# Patient Record
Sex: Female | Born: 1950
Health system: Southern US, Community
[De-identification: ages and names within clinical notes are randomized; demographics above are authoritative.]

## PROBLEM LIST (undated history)

## (undated) DIAGNOSIS — E119 Type 2 diabetes mellitus without complications: Secondary | ICD-10-CM

## (undated) DIAGNOSIS — K219 Gastro-esophageal reflux disease without esophagitis: Secondary | ICD-10-CM

## (undated) DIAGNOSIS — I82409 Acute embolism and thrombosis of unspecified deep veins of unspecified lower extremity: Secondary | ICD-10-CM

## (undated) DIAGNOSIS — F419 Anxiety disorder, unspecified: Secondary | ICD-10-CM

## (undated) DIAGNOSIS — M47816 Spondylosis without myelopathy or radiculopathy, lumbar region: Secondary | ICD-10-CM

## (undated) DIAGNOSIS — M51369 Other intervertebral disc degeneration, lumbar region without mention of lumbar back pain or lower extremity pain: Secondary | ICD-10-CM

## (undated) DIAGNOSIS — T8859XA Other complications of anesthesia, initial encounter: Secondary | ICD-10-CM

## (undated) DIAGNOSIS — M5136 Other intervertebral disc degeneration, lumbar region: Secondary | ICD-10-CM

## (undated) DIAGNOSIS — M797 Fibromyalgia: Secondary | ICD-10-CM

## (undated) DIAGNOSIS — Z8719 Personal history of other diseases of the digestive system: Secondary | ICD-10-CM

## (undated) DIAGNOSIS — I10 Essential (primary) hypertension: Secondary | ICD-10-CM

## (undated) DIAGNOSIS — K760 Fatty (change of) liver, not elsewhere classified: Secondary | ICD-10-CM

## (undated) DIAGNOSIS — Z9889 Other specified postprocedural states: Secondary | ICD-10-CM

## (undated) DIAGNOSIS — J189 Pneumonia, unspecified organism: Secondary | ICD-10-CM

## (undated) DIAGNOSIS — Z95818 Presence of other cardiac implants and grafts: Secondary | ICD-10-CM

## (undated) DIAGNOSIS — E039 Hypothyroidism, unspecified: Secondary | ICD-10-CM

## (undated) DIAGNOSIS — T4145XA Adverse effect of unspecified anesthetic, initial encounter: Secondary | ICD-10-CM

## (undated) DIAGNOSIS — H409 Unspecified glaucoma: Secondary | ICD-10-CM

## (undated) DIAGNOSIS — I2699 Other pulmonary embolism without acute cor pulmonale: Secondary | ICD-10-CM

## (undated) DIAGNOSIS — N200 Calculus of kidney: Secondary | ICD-10-CM

## (undated) DIAGNOSIS — G5600 Carpal tunnel syndrome, unspecified upper limb: Secondary | ICD-10-CM

## (undated) HISTORY — DX: Carpal tunnel syndrome, unspecified upper limb: G56.00

## (undated) HISTORY — DX: Gastro-esophageal reflux disease without esophagitis: K21.9

## (undated) HISTORY — DX: Spondylosis without myelopathy or radiculopathy, lumbar region: M47.816

## (undated) HISTORY — PX: BACK SURGERY: SHX140

## (undated) HISTORY — DX: Acute embolism and thrombosis of unspecified deep veins of unspecified lower extremity: I82.409

## (undated) HISTORY — DX: Fatty (change of) liver, not elsewhere classified: K76.0

## (undated) HISTORY — PX: ARM HARDWARE REMOVAL: SUR1122

## (undated) HISTORY — DX: Other pulmonary embolism without acute cor pulmonale: I26.99

## (undated) HISTORY — PX: TUBAL LIGATION: SHX77

## (undated) HISTORY — PX: ABDOMINAL HYSTERECTOMY: SHX81

## (undated) HISTORY — DX: Other intervertebral disc degeneration, lumbar region: M51.36

## (undated) HISTORY — PX: KNEE ARTHROSCOPY: SHX127

## (undated) HISTORY — DX: Other intervertebral disc degeneration, lumbar region without mention of lumbar back pain or lower extremity pain: M51.369

## (undated) HISTORY — DX: Other specified postprocedural states: Z98.890

## (undated) HISTORY — DX: Presence of other cardiac implants and grafts: Z95.818

## (undated) HISTORY — DX: Fibromyalgia: M79.7

## (undated) HISTORY — PX: CHOLECYSTECTOMY: SHX55

## (undated) HISTORY — DX: Calculus of kidney: N20.0

## (undated) HISTORY — PX: FRACTURE SURGERY: SHX138

## (undated) HISTORY — DX: Anxiety disorder, unspecified: F41.9

---
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
## 1989-04-06 HISTORY — PX: CARPAL TUNNEL RELEASE: SHX101

## 1992-04-06 HISTORY — PX: CARDIAC CATHETERIZATION: SHX172

## 1994-04-06 HISTORY — PX: DE QUERVAIN'S RELEASE: SHX1439

## 1996-04-06 HISTORY — PX: ORIF FINGER / THUMB FRACTURE: SUR932

## 1997-12-29 ENCOUNTER — Emergency Department (HOSPITAL_COMMUNITY): Admission: EM | Admit: 1997-12-29 | Discharge: 1997-12-29 | Payer: Self-pay | Admitting: Emergency Medicine

## 1999-12-31 ENCOUNTER — Encounter: Payer: Self-pay | Admitting: Internal Medicine

## 1999-12-31 ENCOUNTER — Ambulatory Visit (HOSPITAL_COMMUNITY): Admission: RE | Admit: 1999-12-31 | Discharge: 1999-12-31 | Payer: Self-pay | Admitting: Internal Medicine

## 2003-07-18 ENCOUNTER — Ambulatory Visit (HOSPITAL_COMMUNITY): Admission: RE | Admit: 2003-07-18 | Discharge: 2003-07-18 | Payer: Self-pay | Admitting: Urology

## 2004-04-30 ENCOUNTER — Ambulatory Visit: Payer: Self-pay | Admitting: Internal Medicine

## 2004-05-02 ENCOUNTER — Ambulatory Visit (HOSPITAL_COMMUNITY): Admission: RE | Admit: 2004-05-02 | Discharge: 2004-05-02 | Payer: Self-pay | Admitting: Internal Medicine

## 2004-06-04 ENCOUNTER — Emergency Department (HOSPITAL_COMMUNITY): Admission: EM | Admit: 2004-06-04 | Discharge: 2004-06-04 | Payer: Self-pay | Admitting: Emergency Medicine

## 2004-06-13 ENCOUNTER — Emergency Department (HOSPITAL_COMMUNITY): Admission: EM | Admit: 2004-06-13 | Discharge: 2004-06-13 | Payer: Self-pay | Admitting: Emergency Medicine

## 2004-06-17 ENCOUNTER — Ambulatory Visit: Payer: Self-pay | Admitting: Internal Medicine

## 2004-06-17 ENCOUNTER — Ambulatory Visit (HOSPITAL_COMMUNITY): Admission: RE | Admit: 2004-06-17 | Discharge: 2004-06-17 | Payer: Self-pay | Admitting: Internal Medicine

## 2004-06-17 HISTORY — PX: ESOPHAGOGASTRODUODENOSCOPY: SHX1529

## 2004-06-17 HISTORY — PX: COLONOSCOPY: SHX174

## 2004-06-27 ENCOUNTER — Ambulatory Visit: Payer: Self-pay | Admitting: Cardiology

## 2004-07-17 ENCOUNTER — Ambulatory Visit: Payer: Self-pay | Admitting: Internal Medicine

## 2004-07-29 ENCOUNTER — Ambulatory Visit: Payer: Self-pay | Admitting: Internal Medicine

## 2004-08-21 ENCOUNTER — Ambulatory Visit (HOSPITAL_COMMUNITY): Admission: RE | Admit: 2004-08-21 | Discharge: 2004-08-21 | Payer: Self-pay | Admitting: Internal Medicine

## 2004-08-21 ENCOUNTER — Ambulatory Visit: Payer: Self-pay | Admitting: Internal Medicine

## 2005-02-04 ENCOUNTER — Encounter
Admission: RE | Admit: 2005-02-04 | Discharge: 2005-05-05 | Payer: Self-pay | Admitting: Physical Medicine & Rehabilitation

## 2006-01-25 ENCOUNTER — Ambulatory Visit: Payer: Self-pay | Admitting: Orthopedic Surgery

## 2006-01-29 ENCOUNTER — Encounter: Payer: Self-pay | Admitting: Orthopedic Surgery

## 2006-01-29 ENCOUNTER — Emergency Department (HOSPITAL_COMMUNITY): Admission: EM | Admit: 2006-01-29 | Discharge: 2006-01-29 | Payer: Self-pay | Admitting: Emergency Medicine

## 2006-02-19 ENCOUNTER — Encounter: Admission: RE | Admit: 2006-02-19 | Discharge: 2006-02-19 | Payer: Self-pay | Admitting: Orthopedic Surgery

## 2006-02-19 ENCOUNTER — Encounter: Payer: Self-pay | Admitting: Orthopedic Surgery

## 2006-03-15 ENCOUNTER — Encounter: Admission: RE | Admit: 2006-03-15 | Discharge: 2006-03-15 | Payer: Self-pay | Admitting: Internal Medicine

## 2006-05-04 ENCOUNTER — Encounter: Admission: RE | Admit: 2006-05-04 | Discharge: 2006-05-04 | Payer: Self-pay | Admitting: Orthopedic Surgery

## 2007-07-27 ENCOUNTER — Ambulatory Visit: Payer: Self-pay | Admitting: Cardiology

## 2008-02-10 ENCOUNTER — Ambulatory Visit: Payer: Self-pay | Admitting: Internal Medicine

## 2008-08-17 ENCOUNTER — Encounter: Payer: Self-pay | Admitting: Gastroenterology

## 2008-10-17 ENCOUNTER — Encounter: Payer: Self-pay | Admitting: Gastroenterology

## 2009-02-06 ENCOUNTER — Encounter: Payer: Self-pay | Admitting: Urgent Care

## 2009-06-07 ENCOUNTER — Encounter: Payer: Self-pay | Admitting: Internal Medicine

## 2009-08-15 ENCOUNTER — Encounter: Payer: Self-pay | Admitting: Gastroenterology

## 2009-09-16 ENCOUNTER — Ambulatory Visit: Payer: Self-pay | Admitting: Orthopedic Surgery

## 2009-09-16 DIAGNOSIS — M48061 Spinal stenosis, lumbar region without neurogenic claudication: Secondary | ICD-10-CM

## 2009-09-19 ENCOUNTER — Encounter: Payer: Self-pay | Admitting: Orthopedic Surgery

## 2009-09-23 ENCOUNTER — Ambulatory Visit (HOSPITAL_COMMUNITY): Admission: RE | Admit: 2009-09-23 | Discharge: 2009-09-23 | Payer: Self-pay | Admitting: Orthopedic Surgery

## 2009-09-24 ENCOUNTER — Telehealth: Payer: Self-pay | Admitting: Orthopedic Surgery

## 2009-10-01 ENCOUNTER — Telehealth: Payer: Self-pay | Admitting: Orthopedic Surgery

## 2009-10-14 ENCOUNTER — Telehealth: Payer: Self-pay | Admitting: Orthopedic Surgery

## 2009-10-23 ENCOUNTER — Encounter: Payer: Self-pay | Admitting: Gastroenterology

## 2009-12-25 ENCOUNTER — Ambulatory Visit: Payer: Self-pay | Admitting: Internal Medicine

## 2010-01-08 DIAGNOSIS — K219 Gastro-esophageal reflux disease without esophagitis: Secondary | ICD-10-CM | POA: Insufficient documentation

## 2010-01-13 ENCOUNTER — Ambulatory Visit: Payer: Self-pay | Admitting: Internal Medicine

## 2010-01-13 ENCOUNTER — Ambulatory Visit (HOSPITAL_COMMUNITY): Admission: RE | Admit: 2010-01-13 | Discharge: 2010-01-13 | Payer: Self-pay | Admitting: Internal Medicine

## 2010-01-13 HISTORY — PX: COLONOSCOPY: SHX174

## 2010-01-19 ENCOUNTER — Encounter: Payer: Self-pay | Admitting: Internal Medicine

## 2010-05-08 NOTE — Progress Notes (Signed)
Summary: Referral to Neurosurgeon.  Phone Note Outgoing Call   Call placed by: Waldon Reining,  September 24, 2009 10:15 AM Call placed to: Specialist Action Taken: Information Sent Summary of Call: I faxed a referral for this patient to [facility] to be seen for Spinal Stenosis of the l-spine.

## 2010-05-08 NOTE — Assessment & Plan Note (Signed)
Summary: OV FOR REFILL OF MEDS/JBB   Visit Type:  Initial Visit Primary Care Provider:  [facility] [place]  Chief Complaint:  needs refills on meds and wants to discuss timing of next tcs.  History of Present Illness: 60 year old lady returns to see me for consideration of high risk screening colonoscopy. Family history of a younger brother succumbed to disease in his 27s. Patient's last colonoscopy was in 2006. She had  left-sided diverticular disease. No current bowel symptoms whatsoever. History of GERD with erosive reflux esophagitis on 2006 EGD. Symptoms are well-controlled with a Dexilant 60 mg orally daily; no dysphagia, no rectal bleeding, melena or nausea vomiting. She does state when she was hospitalized for 4 days over the summer in [place] with what was described as either ileus versus a partial small bowel obstruction. She recovered spontaneously.  She's planning to have spine surgery later this year. She was wants her colonoscopy out of the way prior to having the surgery.  Current Problems (verified): 1)  Spinal Stenosis, Lumbar  (ICD-724.02) 2)  Hx, Family, Asthma  (ICD-V17.5) 3)  Family History of Arthritis  (ICD-V17.7) 4)  Family History Coronary Heart Disease Female < 55  (ICD-V17.3)  Current Medications (verified): 1)  Ultram 50 Mg Tabs (Tramadol Hcl) .... One By Mouth Q 4 Hrs As Needed Pain 2)  Flexeril 10 Mg Tabs (Cyclobenzaprine Hcl) .... One By Mouth Q 8 Hrs As Needed Pain 3)  Dexilant 60 Mg Cpdr (Dexlansoprazole) .... One By Mouth 30 Mins Before Breakfast Daily 4)  Synthroid 50 Mcg Tabs (Levothyroxine Sodium) .... Once Daily 5)  Vitamin D-2   1.25mg  .... Q Week 6)  Metoprolol Succinate 50 Mg Xr24h-Tab (Metoprolol Succinate) .... Once Daily  Allergies (verified): 1)  ! * Ms Contin 2)  ! Codeine 3)  ! Vicodin 4)  ! Nubain 5)  ! Asa 6)  ! Morphine  Past History:  Past Medical History: Last updated:  09/16/2009 stress depression anxiety osteoporosis fatigue GERD Back pain, chronic  Past Surgical History: Last updated: 09/16/2009 tonsils adenoids 2 cardiac caths cardiac event recorder Tubal ligation hysterectomy lithotripsy x 2 left arm plates and then removal left knee scope left thumb pins left wrist Deq's rt wrist carpal tunnel x 2 rt hand  Family History: Last updated: 09/16/2009 FH of Cancer:  Family History Coronary Heart Disease female < 64 Family History of Arthritis Hx, family, asthma  Social History: Last updated: 09/16/2009 Patient is divorced.  disabled no smoking no alcohol 1 liter of caffeine per day  Vital Signs:  Patient profile:   60 year old female Height:      65 inches Weight:      248 pounds BMI:     41.42 Temp:     99.0 degrees F oral Pulse rate:   72 / minute BP sitting:   124 / 88  (left arm) Cuff size:   large  Vitals Entered By: Hendricks Limes LPN (December 25, 2009 2:32 PM)  Physical Exam  General:  pleasant alert conversant lady resting comfortably Lungs:  clear to auscultation Heart:  regular rate and rhythm without murmur gallop rub Abdomen:  obese positive bowel sounds soft nontender without appreciable mass or organomegaly  Impression & Recommendations: Impression: 60 year old lady with a positive family history of colon cancer in a first-degree relative at a young age and negative colonoscopy in 2006. She is due for high risk screening at this time. It makes sense to proceed with a screening colonoscopy prior  to her embarking on spine surgery later this year.  GERD symptoms well-controlled on Dexalant 60 mg orally daily. No alarm symptoms.  Recommendations: Continue Dexalant 60 mg orally daily prescription given antireflux measures/lifestyle emphasized  Screening colonoscopy in the near future. Risks, benefits, limitations, alternatives and imponderables have been reviewed her questions were answered. She is  agreeable. Further recommendations to follow once colonoscopy has been performed.  Appended Document: Orders Update    Clinical Lists Changes  Problems: Added new problem of GERD (ICD-530.81) Added new problem of NEOPLASM, MALIGNANT, COLORECTAL, FAMILY HX (ICD-V16.0) Orders: Added new Service order of New Patient Level IV ([id number]) - Signed

## 2010-05-08 NOTE — Progress Notes (Signed)
Summary: Initial evaluation  Initial evaluation   Imported By: Jacklynn Ganong 09/12/2009 14:55:22  _____________________________________________________________________  External Attachment:    Type:   Image     Comment:   External Document

## 2010-05-08 NOTE — Letter (Signed)
Summary: Patient Notice, Colon Biopsy Results  [facility]  [address]   [place], [place] [address]   Phone: [phone]  Fax: [phone]       January 19, 2010   CELESTA FUNDERBURK [address] [place], [place]  [address] 10-24-1950    Dear [name],  I am pleased to inform you that the biopsies taken during your recent colonoscopy did not show any evidence of cancer upon pathologic examination.  Additional information/recommendations:  You should have a repeat colonoscopy examination  in 5 ears.  Please call us if you are having persistent problems or have questions about your condition that have not been fully answered at this time.  Sincerely,    R. Roetta Sessions MD, FACP [facility] Ph: [phone]    Fax: [phone]   Appended Document: Patient Notice, Colon Biopsy Results Letter mailed to pt.  Appended Document: Patient Notice, Colon Biopsy Results Letter and path mailed to PCP. [facility].  Appended Document: Patient Notice, Colon Biopsy Results 74YR TCS REMINDER IS IN THE COMPUTER

## 2010-05-08 NOTE — Letter (Signed)
Summary: MEDCO  MEDCO   Imported By: Diana Eves 06/07/2009 09:23:37  _____________________________________________________________________  External Attachment:    Type:   Image     Comment:   External Document

## 2010-05-08 NOTE — Medication Information (Signed)
Summary: Tax adviser   Imported By: Diana Eves 08/15/2009 15:51:15  _____________________________________________________________________  External Attachment:    Type:   Image     Comment:   External Document  Appended Document: RX Folder-bentyl    Prescriptions: DICYCLOMINE HCL 10 MG CAPS (DICYCLOMINE HCL) one by mouth before breakfast, luch, dinner as needed for diarrhea/abd pain  #90 x 0   Entered and Authorized by:   Leanna Battles. Dixon Boos   Signed by:   Leanna Battles Dixon Boos on 08/15/2009   Method used:   Electronically to        [facility]* (retail)       [address]       [place], [place]  [address]       Ph: [phone]       Fax: [phone]   RxID:   [id number]    PATIENT NEEDS OV PRIOR TO FURTHER REFILLS

## 2010-05-08 NOTE — Letter (Signed)
Summary: History form  History form   Imported By: Jacklynn Ganong 09/23/2009 14:23:09  _____________________________________________________________________  External Attachment:    Type:   Image     Comment:   External Document

## 2010-05-08 NOTE — Medication Information (Signed)
Summary: Tax adviser   Imported By: Diana Eves 10/23/2009 09:54:02  _____________________________________________________________________  External Attachment:    Type:   Image     Comment:   External Document  Appended Document: RX Folder-dexilant    Prescriptions: DEXILANT 60 MG CPDR (DEXLANSOPRAZOLE) one by mouth 30 mins before breakfast daily  #30 x 3   Entered and Authorized by:   Leanna Battles. Dixon Boos   Signed by:   Leanna Battles Dixon Boos on 10/23/2009   Method used:   Electronically to        [facility]* (retail)       [address]       [place], [place]  [address]       Ph: [phone]       Fax: [phone]   RxID:   [id number]    NEEDS OV BY 10/11 FOR FURTHER REFILLS.  Appended Document: RX Folder APT IN IDX ON 12/06/09@11  JBB

## 2010-05-08 NOTE — Miscellaneous (Signed)
Summary: mri l spine 09/20/09 1230p aph per pt request  Clinical Lists Changes  edicare no precert needed, we will get results of MRI, call pt with results and then refer to Neurosurgeon

## 2010-05-08 NOTE — Progress Notes (Signed)
Summary: pt needs to postpone neurosurgeon appointment  Phone Note Call from Patient   Caller: Patient Summary of Call: Patient called to relay that her PCP Dr Margo Aye, [facility] in [place], is treating for another medical issue that has come up- small  bowel obstruction; procedure being scheduled.  Patient said that PCP recommends following up on this first and postponing the neurosurgeon appointment for Thurs, 10/17/09 with Dr Jeral Fruit.  - I called [facility] and left voice mail message to cancel this appointment and for their office to call to confirm information received. Initial call taken by: Cammie Sickle,  October 14, 2009 4:33 PM  Follow-up for Phone Call        Susie called back from [facility] and has cancelled appointment per patient request. Follow-up by: Cammie Sickle,  October 14, 2009 4:50 PM

## 2010-05-08 NOTE — Assessment & Plan Note (Signed)
Summary: BACK PAIN RADIATES DOWN/MEDICARE/MEDICAID/BSF   Vital Signs:  Patient profile:   60 year old female Height:      65 inches Weight:      249 pounds Pulse rate:   80 / minute Resp:     16 per minute  Vitals Entered By: Fuller Canada MD (September 16, 2009 9:46 AM)  Visit Type:  new patient Referring Provider:  self Primary Provider:  Dr. Margo Aye in [place]  CC:  low back pain.  History of Present Illness: I saw Yesenia [name] in the office today for an initial visit.  She is a 60 years old woman with the complaint of:  low back pain.  2007 was seen here for this problem, had ct scan and ESI's, was advised to come back after 3rd ESI, did not.  CT scan for review from 2007  She comes in today complaining of constant back pain which radiates to her RIGHT side and into the RIGHT hip and down the RIGHT leg with numb feeling noted on entering outer side of the leg below the knee.  She says it feels like pins and needles are sticking her.  The pain is described as sharp throbbing and stabbing.  The intensity is an 8+.  Timing is constant.  She is tried cool packs hot water bottles over-the-counter Aleve Ultram and Flexeril.  The Aleve helped a little bit she seem to give her relief of Ultram and Flexeril.  Worsening symptoms are noted if she stands for more than 10 minutes.  She says her lower back will lock up.  The RIGHT leg is weak and wobbles.  Meds: Dexilant, Fluoxetine, Buspar, Xanax, Trazodone, ALeve.    Current Medications (verified): 1)  Dicyclomine Hcl 10 Mg Caps (Dicyclomine Hcl) .... One By Mouth Before Breakfast, Luch, Dinner As Needed For Diarrhea/abd Pain 2)  Ultram 50 Mg Tabs (Tramadol Hcl) .... One By Mouth Q 4 Hrs As Needed Pain 3)  Flexeril 10 Mg Tabs (Cyclobenzaprine Hcl) .... One By Mouth Q 8 Hrs As Needed Pain  Allergies (verified): 1)  ! * Ms Contin 2)  ! Codeine 3)  ! Vicodin 4)  ! Nubain  Past History:  Family History: Last updated: 09/16/2009 FH of  Cancer:  Family History Coronary Heart Disease female < 87 Family History of Arthritis Hx, family, asthma  Social History: Last updated: 09/16/2009 Patient is divorced.  disabled no smoking no alcohol 1 liter of caffeine per day  Past Medical History: stress depression anxiety osteoporosis fatigue GERD Back pain, chronic  Past Surgical History: tonsils adenoids 2 cardiac caths cardiac event recorder Tubal ligation hysterectomy lithotripsy x 2 left arm plates and then removal left knee scope left thumb pins left wrist Deq's rt wrist carpal tunnel x 2 rt hand  Family History: FH of Cancer:  Family History Coronary Heart Disease female < 16 Family History of Arthritis Hx, family, asthma  Social History: Patient is divorced.  disabled no smoking no alcohol 1 liter of caffeine per day  Review of Systems Constitutional:  Complains of fatigue; denies weight loss, weight gain, fever, and chills. Cardiovascular:  Denies chest pain, palpitations, fainting, and murmurs. Respiratory:  Complains of snoring; denies short of breath, wheezing, couch, tightness, pain on inspiration, and snoring . Gastrointestinal:  Complains of heartburn, nausea, and vomiting; denies diarrhea, constipation, and blood in your stools. Genitourinary:  Denies frequency, urgency, difficulty urinating, painful urination, flank pain, and bleeding in urine. Neurologic:  Complains of numbness and unsteady gait;  denies tingling, dizziness, tremors, and seizure. Musculoskeletal:  Complains of instability, stiffness, and muscle pain; denies joint pain, swelling, redness, and heat. Endocrine:  Complains of excessive thirst and heat or cold intolerance; denies exessive urination. Psychiatric:  Complains of nervousness, depression, and anxiety; denies hallucinations. Skin:  Complains of poor healing and itching; denies changes in the skin, rash, and redness. HEENT:  Complains of blurred or double vision and  redness; denies eye pain and watering. Immunology:  Complains of seasonal allergies; denies sinus problems and allergic to bee stings. Hemoatologic:  Complains of brusing; denies easy bleeding.  Physical Exam  Skin:  intact without lesions or rashes Inguinal Nodes:  no significant adenopathy Psych:  alert and cooperative; normal mood and affect; normal attention span and concentration   Detailed Back/Spine Exam  General:    Well-developed, well-nourished, in no acute distress; alert and oriented x 3.    Gait:    antalgic.    Skin:    Intact with no erythema; no scarring.    Inspection:    no major deformity is noted in the lumbar spine  Palpation:    there is tenderness in the RIGHT side of her back  Vascular:    dorsalis pedis and posterior tibial pulses 2+ and symmetric, capillary refill < 2 seconds, normal hair pattern, no evidence of ischemia.   Lumbosacral Exam:  Inspection-deformity:    Normal Palpation-spinal tenderness:  Abnormal    Location:  L5-S1     her motor exam is normal in terms of dorsiflexion plantarflexion of the foot.  Her straight leg raise is positive on the RIGHT   Hip Exam  Hip Exam:    Right:    Inspection:  Normal    Palpation:  Normal    Stability:  stable    Tenderness:  no    Swelling:  no    Erythema:  no    Range of Motion:       Flexion-Active: 120       Extension-Active: 30       Internal Rotation-Active: 45       External Rotation-Active: 45       Flexion-Passive: 120       Extension-Passive: 30       Internal Rotation-Passive: 45       External Rotation: 45    Left:    Inspection:  Normal    Palpation:  Normal    Stability:  stable    Tenderness:  no    Swelling:  no    Erythema:  no    Range of Motion:       Flexion-Active: 120       Extension-Active: 30       Internal Rotation-Active: 45       External Rotation-Active: 45       Flexion-Passive: 120       Extension-Passive: 30       Internal Rotation-Passive:  45       External Rotation: 45   Impression & Recommendations:  Problem # 1:  SPINAL STENOSIS, LUMBAR (ICD-724.02) Assessment New previous studies have been reviewed she is noted to have a spondylosis in the lumbar spine with facet arthritis. she has become refractory to nonsurgical treatment and she has progressed in her disease and should see a neurosurgeon for further care and no followup needed here.  I did give her some Ultram 50 mg q.4 p.r.n. pain #90 with 5 refills and Flexeril 10 mg q.8 #90 with 5 refills  I ordered an MRI of the lumbar spine Orders: Neurosurgeon Referral (Neurosurgeon) New Patient Level III [phone])  Medications Added to Medication List This Visit: 1)  Ultram 50 Mg Tabs (Tramadol hcl) .... One by mouth q 4 hrs as needed pain 2)  Flexeril 10 Mg Tabs (Cyclobenzaprine hcl) .... One by mouth q 8 hrs as needed pain 3)  Ultram 50 Mg Tabs (Tramadol hcl) .... One q. 4 p.r.n. pain 4)  Flexeril 10 Mg Tabs (Cyclobenzaprine hcl) .[name].. 1 q8  Patient Instructions: 1)  MRI LUMBAR open Unit [facility]  2)  Referral to Neuro surgery after MRI  3)  I will call with results  4)  start Ultram and flexeril Prescriptions: FLEXERIL 10 MG TABS (CYCLOBENZAPRINE HCL) 1 q8  #90 x 5   Entered and Authorized by:   Fuller Canada MD   Signed by:   Fuller Canada MD on 09/18/2009   Method used:   Handwritten   RxID:   [id number] ULTRAM 50 MG TABS (TRAMADOL HCL) one q. 4 p.r.n. pain  #90 x 5   Entered and Authorized by:   Fuller Canada MD   Signed by:   Fuller Canada MD on 09/18/2009   Method used:   Faxed to ...       [name]* (retail)       [address]       [place], [place]  [address]       Ph: [phone]       Fax: [phone]   RxID:   [id number] FLEXERIL 10 MG TABS (CYCLOBENZAPRINE HCL) one by mouth q 8 hrs as needed pain  #90 deliver x 0   Entered by:   Ether Griffins   Authorized by:   Fuller Canada MD   Signed by:   Ether Griffins on 09/16/2009   Method used:   Faxed to ...       [name]* (retail)       [address]       [place], [place]  [address]       Ph: [phone]       Fax: [phone]   RxID:   [id number] ULTRAM 50 MG TABS (TRAMADOL HCL) one by mouth q 4 hrs as needed pain  #90 deliver x 0   Entered by:   Ether Griffins   Authorized by:   Fuller Canada MD   Signed by:   Ether Griffins on 09/16/2009   Method used:   Faxed to ...       [name]* (retail)       [address]       [place], [place]  [address]       Ph: [phone]       Fax: [phone]   RxID:   [id number]

## 2010-05-08 NOTE — Letter (Signed)
Summary: TCS ORDER  TCS ORDER   Imported By: Ave Filter 12/25/2009 14:55:19  _____________________________________________________________________  External Attachment:    Type:   Image     Comment:   External Document

## 2010-05-08 NOTE — Progress Notes (Signed)
Summary: Neurosurgeon appointment.  Phone Note Outgoing Call   Call placed by: Waldon Reining,  October 01, 2009 11:36 AM Call placed to: Patient Action Taken: Appt scheduled Summary of Call: I called to give the patient her appointment at [facility] with Dr. Jeral Fruit on 10-17-09 at 10:30. Patient is aware to take her films.

## 2010-05-09 NOTE — Medication Information (Signed)
Summary: Tax adviser   Imported By: Cammie Sickle 09/28/2009 13:34:15  _____________________________________________________________________  External Attachment:    Type:   Image     Comment:   External Document

## 2010-08-19 NOTE — Assessment & Plan Note (Signed)
NAMEMarland [name]  SIBONEY, REQUEJO           CHART#:  [id number]   DATE:  02/10/2008                       DOB:  1951/03/23   PRIMARY CARE PHYSICIAN:  Katherine Roan, M.D., [facility].   CHIEF COMPLAINT:  Abdominal pain, nausea, and vomiting.   HISTORY OF PRESENT ILLNESS:  The patient is a 60 year old Caucasian  female.  She has had a 3-week history of abdominal pain, nausea,  vomiting, and diarrhea.  She was admitted to [facility]  who felt to have gastroenteritis.  She has a history of eosinophilic  gastroenteritis and has had to have previous steroid treatment.  She  tells me she has been under a significant amount of stress and has lost  custody of her grandchild.  She is having a heating sensation in her  lower abdomen, it is intermittent.  Pain is 8/10 on pain scale.  It  lasted several days at a time.  Along with it, she has nausea and  vomiting a couple of times a day as well as diarrhea 2-3 times per day.  She denies any rectal bleeding, melena, or mucus in her stools.  The  last time she vomited was 3 days ago.  She has had some chills.  She has  been able to keep down liquids since that time.  She denies any fever.  She denies any recent antibiotic use.  She is taking omeprazole 20 mg  b.i.d. and has been on this regularly for a couple of years.  She has  lost 10 pounds in the last 3 weeks.  She notes she is under a  significant amount of stress.  She has had anorexia.  She has had a  heartburn and indigestion that was well controlled up until this point.  Her pain is not associated with eating either.   PAST MEDICAL AND SURGICAL HISTORY:  She has history of eosinophilic  gastroenteritis.  She was last treated with prednisone by Dr. Jena Gauss in  April 2006.  She has history of anxiety, depression, vitamin D  deficiency, hypothyroidism, and osteoporosis.  She had a complete  hysterectomy, appendectomy, and cholecystectomy in 1997.  She has  had  colonoscopy and EGD by Dr. Jena Gauss on 06/17/2004.  She had erosive reflux  esophagitis, patulous EG junction, and small hiatal hernia.  She had a  left-sided diverticula, renal lithiasis status post lithotripsies,  cystoscopy, an ureteral stent placement, Port-A-Cath in 1998 normal per  patient, multiple knee and arm surgeries, and carpal tunnel release.   CURRENT MEDICATIONS:  Lexapro 20 mg daily, Wellbutrin 150 mg daily,  Xanax 0.5 mg t.i.d. p.r.n., vitamin D 18,000 international units weekly,  and omeprazole 20 mg b.i.d.   ALLERGIES:  Nubain, codeine, aspirin, morphine, and Vicodin.   FAMILY HISTORY:  Positive for brother diagnosed with colon cancer at age  50, deceased at 41.  Mother, history of hypertension and depression at  age 58.  Father deceased at 63 secondary to an accident.   SOCIAL HISTORY:  The patient is divorced.  She has 3 healthy children.  She is disabled.  She denies any tobacco, alcohol, or drug use.   REVIEW OF SYSTEMS:  See HPI, otherwise negative.   PHYSICAL EXAMINATION:  VITAL SIGNS:  Weight 258 pounds, height 63  inches, temperature 98.5, blood pressure 110/70, and pulse 60.  GENERAL:  The patient is an obese Caucasian female who is alert,  oriented, pleasant, and cooperative, in no acute distress.  HEENT:  Sclerae clear and nonicteric.  Conjunctivae pink.  Oropharynx pink and  moist without any lesions.  NECK:  Supple without any mass or thyromegaly.  CHEST:  Heart regular rate and rhythm.  Normal S1 and S2 without  murmurs, clicks, rubs, or gallops.  LUNGS:  Clear to auscultation bilaterally.  ABDOMEN:  Protuberant with positive bowel sounds x4.  No bruits  auscultated.  Abdomen is soft, nondistended, nontender without palpable  mass or hepatosplenomegaly.  No rebound, tenderness, or guarding.  EXTREMITIES:  Without clubbing or edema.   IMPRESSION:  The patient is a 60 year old Caucasian female with a 3-week  history of intermittent abdominal  pain, nausea, vomiting, and diarrhea.  I suspect, she could have recurrence of eosinophilic esophagitis.  She  has required hospitalization at [facility].  We will  review requesting records.   Her weight loss is concerning.  She does have a history of thyroid  disease and we should recheck this as well.   PLAN:  1. Levsin 0.125 mg a.c. and nightly p.r.n. diarrhea, #60 with 1      refill.  2. Kapidex 60 mg daily.  I had given her 2 boxes of samples and 60      with 5 refills.  3. She is to discontinue omeprazole.  4. We would check CBC, amylase, lipase, LFTs, TSH, and MET-7.  5. If she has severe pain, she is going to go to the emergency room.  6. We will request records for review from [facility].  7. If she has eosinophilia, we will treat with prednisone.        Lorenza Burton, N.P.  Electronically Signed     R. Roetta Sessions, M.D.  Electronically Signed    KJ/MEDQ  D:  02/11/2008  T:  02/11/2008  Job:  [id number]   cc:   Brion Aliment. Kyra Manges, M.D.
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
## 2010-08-22 NOTE — H&P (Signed)
NAME:  Yesenia [name], Yesenia [name]                    ACCOUNT NO.:  [id number]   MEDICAL RECORD NO.:  [id number]                   PATIENT TYPE:  AMB   LOCATION:  DAY                                  FACILITY:  [facility]   PHYSICIAN:  Dennie Maizes, M.D.                DATE OF BIRTH:  December 20, 1950   DATE OF ADMISSION:  07/18/2003  DATE OF DISCHARGE:                                HISTORY & PHYSICAL   CHIEF COMPLAINT:  Severe right flank pain, nausea/vomiting, right renal  calculus with obstruction.   HISTORY OF PRESENT ILLNESS:  This 60 year old female has a past medical  history of urolithiasis.  She was having intermittent right flank pain of  moderate severity for about a week.  The pain became very severe and she  came to the emergency room at [facility] on July 02, 2003.  She was treated with pain pills without much relief.  She continued to have  severe pain, intermittent nausea and vomiting as well as mild hematuria.  Evaluation was done with CT scan of abdomen and pelvis.  This revealed a 8 x  6 mm size right renal calculus at the ureteropelvic junction area with  obstruction.  The patient was admitted to the hospital.  She was treated  with parenteral narcotics.  She was unable to pass the stone.  She was taken  to the OR and underwent cystoscopy, right retrograde pyelogram, and right  ureteral stent placement.  Subsequently the patient has been treated for  reflux esophagitis.  She is doing well at present.  She is brought to the  day hospital today for extracorporeal shock wave lithotripsy of the right  renal calculus.   PAST MEDICAL HISTORY:  1. Severe anxiety with depression.  2. Osteoporosis.  3. Chronic obstructive pulmonary disease.  4. Urolithiasis.  5. Reflux esophagitis.  6. Status post total abdominal hysterectomy with appendectomy.  7. Status post cardiac catheterization which was normal.  8. Status post cholecystectomy.  9. Status post left knee  operation x2.  10.      Status post left thumb operation.  11.      Status post operations on the forearm x2.  12.      Status post carpal tunnel release bilaterally.  13.      Status post surgery of the right hand for de Quervain's fracture.  14.      Status post tonsillectomy and adenoidectomy.   FAMILY HISTORY:  Positive for kidney stones and colon cancer.   MEDICATIONS:  1. Lexapro 20 mg p.o. daily.  2. Xanax 0.5 mg one p.o. q.6h. p.r.n. anxiety.  3. Albuterol inhaler as needed for shortness of breath.  4. Actonel 30 mg one p.o. every week.  5. Darvocet-N 100 one p.o. q. 8h. p.r.n. pain.  6. Protonix one p.o. t.i.d.  7. Reglan one p.o. q.i.d.  8. Phenergan p.r.n. nausea.   ALLERGIES:  MORPHINE SULFATE, CODEINE, NUBAIN, VICODIN, and  ASPIRIN.   EXAMINATION:  GENERAL:  The patient is comfortable at the time of  examination.  HEAD/EYES/EARS/NOSE AND THROAT:  Normal.  NECK:  No masses.  LUNGS:  Clear to auscultation.  HEART:  Regular rate and rhythm.  No murmurs.  ABDOMEN:  Soft.  No palpable flank mass.  No CVA tenderness.  Bladder not  palpable.  No suprapubic tenderness.   RADIOLOGY:  Recent x-ray of the KUB area revealed the right ureteral stent  in good position.  The 8 x 6 mm size right renal calculus was noted.   IMPRESSION:  Right renal calculus with obstruction, right renal colic,  status post right ureteral stent placement.   PLAN:  Extracorporeal shock wave lithotripsy of the right renal calculus  with IV sedation in day hospital.  I have discussed with the patient  regarding the diagnosis, operative details, alternate treatments, the  outcome, possible risks and complications and she has agreed for the  procedure to be done.     ___________________________________________                                         Dennie Maizes, M.D.   SK/MEDQ  D:  07/17/2003  T:  07/17/2003  Job:  [id number]   cc:   Annette Stable Hasanaj  [address]  [place]  [place] [address]  Fax:  [phone]
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
## 2010-08-22 NOTE — Op Note (Signed)
NAME:  Yesenia [name], Yesenia [name]          ACCOUNT NO.:  [id number]   MEDICAL RECORD NO.:  [id number]          PATIENT TYPE:  AMB   LOCATION:  DAY                           FACILITY:  [facility]   PHYSICIAN:  R. Roetta Sessions, M.D. DATE OF BIRTH:  1950-06-10   DATE OF PROCEDURE:  06/17/2004  DATE OF DISCHARGE:                                 OPERATIVE REPORT   PROCEDURES:  Diagnostic esophagogastroduodenoscopy, followed by colonoscopy.   INDICATION FOR PROCEDURE:  The patient is a 60 year old lady with a 58-month  history of nonspecific abdominal pain, refractory GERD symptoms even to  Protonix 30 mg orally and Reglan 15 mg a.c. and h.s.  Had positive family  history of colon cancer in her brother, who succumbed to the disease in his  75s.  Her last colonoscopy was 1998.  Recent CT scan demonstrated  diverticulosis, low-attenuation nonspecific focus in the spleen, sigmoid  diverticulosis, status post hysterectomy and appendectomy.  CBC was normal  through the office, amylase and lipase also okay.  AST and ALT slightly up  at 59 and 63, respectively.  TSH was up at 10.447.  Further thyroid studies  and repeat LFTs are being done today.  EGD and colonoscopy are now being  done.  This approach has been discussed with the patient at length, the  potential risks, benefits, and alternatives have been reviewed, questions  answered.  She is agreeable.  Please see documentation in the medical record  for more information.   PROCEDURE NOTE:  O2 saturation, blood pressure, pulse, and respiration were  monitored throughout the entirety of the procedure.   CONSCIOUS SEDATION:  Versed 8 mg IV, Demerol 125 mg IV in divided doses.  Cetacaine spray for topical oropharyngeal anesthesia.   INSTRUMENT USED:  Olympus video chip system.   FINDINGS:  EGD:  Examination of the tubular esophagus revealed a large, very  focal distal esophageal erosion with a couple of satellite erosions more  proximally.  The EG  junction was patulous, and the mucosa otherwise appeared  normal.  No evidence of Barrett's, etc.  EG junction easily traversed.   Stomach:  The gastric cavity was empty and insufflated well with air.  A  thorough examination of the mucosa, including a retroflexed view of the  proximal stomach and esophagogastric junction, demonstrated only a small  hiatal hernia.  Pylorus patent and easily traversed.  Examination of the  bulb and second portion revealed no abnormalities.   Therapeutic/diagnostic maneuvers performed:  None.   The patient tolerated the procedure well and was prepared for colonoscopy.   Digital rectal examination revealed no abnormalities.  Endoscopic findings:  Prep was good.   Rectum:  Examination of the rectal mucosa including a retroflexed view of  the anal verge revealed no abnormalities.   Colon:  The colonic mucosa was surveyed from the rectosigmoid junction  thorough the left, transverse and right colon to the area of the appendiceal  orifice, ileocecal valve and cecum.  These structures were well-seen and  photographed for the record.  The terminal ileum was intubated to 15 cm.  From this level the scope was slowly withdrawn  and all previously-mentioned  mucosal surfaces were again seen.  The patient was noted to have sigmoid  diverticula.  The remainder of the mucosa and terminal ileum appeared  normal.  The patient tolerated both procedures well and was reacted in  endoscopy.   IMPRESSION:  Esophagogastroduodenoscopy:  1.  Distal esophageal erosion, a large area with a couple of satellite      erosions more proximally, consistent with at least a component of      erosive reflux esophagitis.  Actonel-associated injury is not excluded      at this time.  Otherwise normal esophagus.  2.  Patulous esophagogastric junction and a small hiatal hernia, otherwise      normal stomach.  3.  Normal D1, D2.   Colonoscopy findings:  1.  Normal rectum.  2.   Left-sided diverticula.  The remainder of the colonic mucosa appeared      normal.  3.  Normal terminal ileum.   RECOMMENDATIONS:  1.  THE PATIENT IS TO STOP ACTONEL.  2.  Stop Protonix and Reglan.  3.  Begin Aciphex 20 mg orally b.i.d. before breakfast and supper.  4.  Antireflux literature provided to Ms. Thomasson.  5.  Diverticulosis literature given to this nice lady.  6.  A follow-up appointment in our office in six weeks to see how she is      doing.  Follow up on LFTs and thyroid functions.      RMR/MEDQ  D:  06/17/2004  T:  06/17/2004  Job:  [id number]

## 2010-08-22 NOTE — Op Note (Signed)
NAMEROBERTO, HLAVATY          ACCOUNT NO.:  [id number]   MEDICAL RECORD NO.:  [id number]          PATIENT TYPE:  OIB   LOCATION:  2899                         FACILITY:  [facility]   PHYSICIAN:  Duke Salvia, M.D.  DATE OF BIRTH:  [birth year]   DATE OF PROCEDURE:  08/21/2004  DATE OF DISCHARGE:                                 OPERATIVE REPORT   PREOPERATIVE DIAGNOSIS:  Syncope with previously implanted loupe recorder,  now at end of life.   POSTOPERATIVE DIAGNOSIS:  Syncope with previously implanted loupe recorder,  now at end of life.   PROCEDURE:  Explantation of a loupe recorder.   After routine prep and drape, the patient received local anesthesia.  The  incision was made over the previous incision, carried down to the layer of  the device pocket.  The pocket was opened.  The device was explanted.  The  retaining sutures were removed.  The anterior and posterior aspects of the  pocket were then opposed.  Saline was used to irrigate the pocket.  The  wound was then closed in three layer in a normal fashion.  The patient  tolerated the procedure without apparent complication.      SCK/MEDQ  D:  08/21/2004  T:  08/21/2004  Job:  [id number]   cc:   Electrophysiology laboratory   [facility]   [place] [facility]  [address]  [place]  [place] [address]  Fax: [phone]

## 2011-02-09 ENCOUNTER — Ambulatory Visit: Payer: Self-pay | Admitting: Family Medicine

## 2011-02-20 ENCOUNTER — Other Ambulatory Visit: Payer: Self-pay | Admitting: Internal Medicine

## 2011-02-23 ENCOUNTER — Other Ambulatory Visit: Payer: Self-pay

## 2011-02-23 NOTE — Telephone Encounter (Signed)
11 rf given 11/16

## 2011-09-01 ENCOUNTER — Ambulatory Visit: Payer: Self-pay | Admitting: Family Medicine

## 2011-09-08 ENCOUNTER — Ambulatory Visit (INDEPENDENT_AMBULATORY_CARE_PROVIDER_SITE_OTHER): Payer: Medicare Other | Admitting: Family Medicine

## 2011-09-08 ENCOUNTER — Encounter: Payer: Self-pay | Admitting: Family Medicine

## 2011-09-08 VITALS — BP 126/86 | HR 82 | Resp 18 | Ht 61.0 in | Wt 275.0 lb

## 2011-09-08 DIAGNOSIS — M48061 Spinal stenosis, lumbar region without neurogenic claudication: Secondary | ICD-10-CM

## 2011-09-08 DIAGNOSIS — M51369 Other intervertebral disc degeneration, lumbar region without mention of lumbar back pain or lower extremity pain: Secondary | ICD-10-CM

## 2011-09-08 DIAGNOSIS — F32A Depression, unspecified: Secondary | ICD-10-CM | POA: Insufficient documentation

## 2011-09-08 DIAGNOSIS — E669 Obesity, unspecified: Secondary | ICD-10-CM

## 2011-09-08 DIAGNOSIS — M2550 Pain in unspecified joint: Secondary | ICD-10-CM

## 2011-09-08 DIAGNOSIS — E66813 Obesity, class 3: Secondary | ICD-10-CM | POA: Insufficient documentation

## 2011-09-08 DIAGNOSIS — F411 Generalized anxiety disorder: Secondary | ICD-10-CM

## 2011-09-08 DIAGNOSIS — I10 Essential (primary) hypertension: Secondary | ICD-10-CM

## 2011-09-08 DIAGNOSIS — M5136 Other intervertebral disc degeneration, lumbar region: Secondary | ICD-10-CM

## 2011-09-08 DIAGNOSIS — R21 Rash and other nonspecific skin eruption: Secondary | ICD-10-CM

## 2011-09-08 DIAGNOSIS — K219 Gastro-esophageal reflux disease without esophagitis: Secondary | ICD-10-CM

## 2011-09-08 DIAGNOSIS — M5137 Other intervertebral disc degeneration, lumbosacral region: Secondary | ICD-10-CM

## 2011-09-08 DIAGNOSIS — E039 Hypothyroidism, unspecified: Secondary | ICD-10-CM

## 2011-09-08 DIAGNOSIS — F419 Anxiety disorder, unspecified: Secondary | ICD-10-CM

## 2011-09-08 LAB — T4: T4, Total: 7.5 ug/dL (ref 5.0–12.5)

## 2011-09-08 LAB — CBC WITH DIFFERENTIAL/PLATELET
Basophils Relative: 1 % (ref 0–1)
Eosinophils Absolute: 0.3 10*3/uL (ref 0.0–0.7)
Eosinophils Relative: 6 % — ABNORMAL HIGH (ref 0–5)
Lymphs Abs: 1.7 10*3/uL (ref 0.7–4.0)
MCH: 27.9 pg (ref 26.0–34.0)
MCHC: 33.2 g/dL (ref 30.0–36.0)
MCV: 84 fL (ref 78.0–100.0)
Neutrophils Relative %: 49 % (ref 43–77)
Platelets: 241 10*3/uL (ref 150–400)
RBC: 4.56 MIL/uL (ref 3.87–5.11)
RDW: 15.1 % (ref 11.5–15.5)

## 2011-09-08 LAB — LIPID PANEL
Cholesterol: 177 mg/dL (ref 0–200)
HDL: 47 mg/dL (ref >39)

## 2011-09-08 LAB — COMPREHENSIVE METABOLIC PANEL
ALT: 15 U/L (ref 0–35)
Alkaline Phosphatase: 73 U/L (ref 39–117)
CO2: 25 mEq/L (ref 19–32)
Creat: 0.86 mg/dL (ref 0.50–1.10)
Sodium: 141 mEq/L (ref 135–145)
Total Bilirubin: 0.4 mg/dL (ref 0.3–1.2)

## 2011-09-08 LAB — TSH: TSH: 7.055 u[IU]/mL — ABNORMAL HIGH (ref 0.350–4.500)

## 2011-09-08 MED ORDER — LISINOPRIL-HYDROCHLOROTHIAZIDE 10-12.5 MG PO TABS: 1.0000 | ORAL_TABLET | Freq: Every day | ORAL | Status: DC

## 2011-09-08 MED ORDER — LEVOTHYROXINE SODIUM 75 MCG PO TABS: 75.0000 ug | ORAL_TABLET | Freq: Every day | ORAL | Status: DC

## 2011-09-08 MED ORDER — CLOTRIMAZOLE-BETAMETHASONE 1-0.05 % EX CREA: TOPICAL_CREAM | CUTANEOUS | Status: DC

## 2011-09-08 NOTE — Assessment & Plan Note (Signed)
Continue PPI ?

## 2011-09-08 NOTE — Patient Instructions (Signed)
Get the labs done fasting Continue current medications I will refer you to neurosurgery for evaluation- Dr.Roy I will obtain records and review  Try the cream for your neck  F/U 6 weeks

## 2011-09-08 NOTE — Assessment & Plan Note (Signed)
Very sedentary and is gaining weight

## 2011-09-08 NOTE — Progress Notes (Signed)
Addended by: Milinda Antis F on: 09/08/2011 10:41 AM   Modules accepted: Orders

## 2011-09-08 NOTE — Assessment & Plan Note (Signed)
Refer to neurosurgery, she has not hand any injections

## 2011-09-08 NOTE — Assessment & Plan Note (Signed)
Followed by [facility] 

## 2011-09-08 NOTE — Assessment & Plan Note (Signed)
Multi joints involved, though no appreciable swelling in hands, will check inflammatory markers, with her muscle aches and fatigue I am concerned for fibromyalgia

## 2011-09-08 NOTE — Progress Notes (Signed)
  Subjective:    Patient ID: Yesenia [name], female    DOB: 18-Mar-1951, 61 y.o.   MRN: [id number]  HPI Pt here to establish care, previous PCP Dr. Felecia Shelling ( Dr. Margo Aye) Medications and History reviewed Optho- Dr. Westley Foots health x 3 years for stress, anxiety and family problems maintained on xanax and trazadone. GERD/Early family history of colon cancer- followed by Dr.Rourk- last colonoscopy 2011. Pain- history of back pain for many years, has DDD and arthritis in lower back, she has contemplated seeing surgery before and now has decided to proceed, she also has pain all over "from head to toe". If you touch her shoulders or back she has pain Concerned about 35lb weight gain over past year, no exercise, very sedentary because of pain and extreme fatigue Rash on neck for past few months, has tried OTC meds which have not helped, not itchy History of extensive cardiac work-up for elevated ST on EKG as well as "tachycardia", normal Cath in the past, had loop recorder for a few years, no cardiac work-up or events since 2004. Needs DMV form  Review of Systems  GEN- + fatigue, fever, weight loss,weakness, recent illness HEENT- denies eye drainage, change in vision, nasal discharge, CVS- denies chest pain, palpitations RESP- denies SOB, cough, wheeze ABD- denies N/V, change in stools, abd pain GU- denies dysuria, hematuria, dribbling, incontinence MSK- + joint pain, +muscle aches, injury Neuro- denies headache, dizziness, syncope, seizure activity       Objective:   Physical Exam GEN- NAD, alert and oriented x3, obese HEENT- PERRL, EOMI, non injected sclera, pink conjunctiva, MMM, oropharynx clear, no teeth upper row, 2 teeth bottom row Neck- Supple, no thryomegaly CVS- RRR, no murmur RESP-CTAB ABD-NABS,soft, NT,ND Back- Spine TTP , TTP righ buttocks, +SLR right side MSK- TTP diffusely on back and shoulders, Neuro- no focal deficits, motor equal bilat  EXT- Trace   edema Pulses- Radial, DP- 2+ Psych-normal affect and Mood Skin- erythematous macular rash on neck creases bilat      Assessment & Plan:  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

## 2011-09-08 NOTE — Assessment & Plan Note (Signed)
Trial of lotrisone 

## 2011-09-08 NOTE — Assessment & Plan Note (Signed)
Refer to neurosurgery, reviewed MRI 2011

## 2011-09-08 NOTE — Assessment & Plan Note (Signed)
At goal , no change to meds, labs to be done

## 2011-09-08 NOTE — Assessment & Plan Note (Signed)
Check thyroid studies with weight gain, recent increase in dose to 

## 2011-09-09 LAB — SEDIMENTATION RATE: Sed Rate: 8 mm/hr (ref 0–22)

## 2011-09-09 LAB — C-REACTIVE PROTEIN: CRP: 0.55 mg/dL (ref <0.60)

## 2011-10-12 ENCOUNTER — Other Ambulatory Visit: Payer: Self-pay | Admitting: Family Medicine

## 2011-10-20 ENCOUNTER — Ambulatory Visit (INDEPENDENT_AMBULATORY_CARE_PROVIDER_SITE_OTHER): Payer: Medicare Other | Admitting: Family Medicine

## 2011-10-20 ENCOUNTER — Encounter: Payer: Self-pay | Admitting: Family Medicine

## 2011-10-20 VITALS — BP 128/90 | HR 86 | Resp 16 | Ht 61.0 in | Wt 267.0 lb

## 2011-10-20 DIAGNOSIS — M797 Fibromyalgia: Secondary | ICD-10-CM

## 2011-10-20 DIAGNOSIS — M48061 Spinal stenosis, lumbar region without neurogenic claudication: Secondary | ICD-10-CM

## 2011-10-20 DIAGNOSIS — R7301 Impaired fasting glucose: Secondary | ICD-10-CM

## 2011-10-20 DIAGNOSIS — I10 Essential (primary) hypertension: Secondary | ICD-10-CM

## 2011-10-20 DIAGNOSIS — E669 Obesity, unspecified: Secondary | ICD-10-CM

## 2011-10-20 DIAGNOSIS — E039 Hypothyroidism, unspecified: Secondary | ICD-10-CM

## 2011-10-20 DIAGNOSIS — IMO0001 Reserved for inherently not codable concepts without codable children: Secondary | ICD-10-CM

## 2011-10-20 LAB — HEMOGLOBIN A1C
Hgb A1c MFr Bld: 6.5 % — ABNORMAL HIGH (ref <5.7)
Mean Plasma Glucose: 140 mg/dL — ABNORMAL HIGH (ref <117)

## 2011-10-20 LAB — T3, FREE: T3, Free: 3.3 pg/mL (ref 2.3–4.2)

## 2011-10-20 LAB — TSH: TSH: 3.115 u[IU]/mL (ref 0.350–4.500)

## 2011-10-20 MED ORDER — TRAMADOL HCL 50 MG PO TABS: 50.0000 mg | ORAL_TABLET | Freq: Four times a day (QID) | ORAL | Status: DC | PRN

## 2011-10-20 MED ORDER — GABAPENTIN 300 MG PO CAPS: 300.0000 mg | ORAL_CAPSULE | Freq: Three times a day (TID) | ORAL | Status: DC

## 2011-10-20 NOTE — Assessment & Plan Note (Signed)
At goal no change in current medications. Labs reviewed renal function is normal

## 2011-10-20 NOTE — Assessment & Plan Note (Signed)
Recheck T3-T4 and TSH today. She still quite she was in a subclinical hypothyroid state at the last check.

## 2011-10-20 NOTE — Assessment & Plan Note (Signed)
I think her muscle aches and joint pains are due to fibromyalgia all of her inflammatory markers were normal she does not have any particular swelling in her joints. She also has severe fatigue as well as depression. We will start her on gabapentin and titrate to 3 times a day  since she also has chronic back pain with some radicular symptoms

## 2011-10-20 NOTE — Assessment & Plan Note (Signed)
Awaiting neurosurgery her tramadol was refilled today

## 2011-10-20 NOTE — Patient Instructions (Signed)
Get the labs done, we will call with results  Continue current medications Continue Ultram Start new medication Gabapentin- take 1 tablet at bedtime x 1 week, then increase to twice a day x 1 week then three times a day  F/U 3 months

## 2011-10-20 NOTE — Assessment & Plan Note (Signed)
Her weight is down a few pounds. She is trying to watch her diet. She did ask today about Adipex we discussed when her other medical problems are stable we will consider trying this medication.

## 2011-10-20 NOTE — Progress Notes (Signed)
  Subjective:    Patient ID: Yesenia [name], female    DOB: 02-06-1951, 61 y.o.   MRN: [id number]  HPI  Patient presents to followup previous visit. Labs were obtained and were reviewed with patient. She continues to have pain all over including joint stiffness. Rheumatology labs were negative. She has tenderness to touch all over her body. She's not her back from neurosurgery regarding her degenerative disc disease and spinal stenosis. She would like her tramadol refills. Hypothyroidism-recent increased to 75 mcg her labs still show the subclinical hypothyroidism she is due for repeat labs today She was seen by her psychiatrist the trazodone was increased to 200 mg at bedtime and her Xanax to 4 times a day  Review of Systems  GEN- denies fatigue, fever, weight loss,weakness, recent illness HEENT- denies eye drainage, change in vision, nasal discharge, CVS- denies chest pain, palpitations RESP- denies SOB, cough, wheeze ABD- denies N/V, change in stools, abd pain GU- denies dysuria, hematuria, dribbling, incontinence MSK- + joint pain,+ muscle aches, injury Neuro- denies headache, dizziness, syncope, seizure activity      Objective:   Physical Exam GEN- NAD, alert and oriented x3 Neck- Supple, no thyromegaly- large neck  CVS- RRR, no murmur RESP-CTAB EXT- No edema Pulses- Radial, DP- 2+ Psych- normal affect and mood       Assessment & Plan:

## 2011-10-28 ENCOUNTER — Encounter: Payer: Self-pay | Admitting: Family Medicine

## 2011-10-28 ENCOUNTER — Ambulatory Visit (HOSPITAL_COMMUNITY)
Admission: RE | Admit: 2011-10-28 | Discharge: 2011-10-28 | Disposition: A | Discharge status: Home / Self Care | Payer: Medicare Other | Source: Ambulatory Visit | Attending: Family Medicine | Admitting: Family Medicine

## 2011-10-28 ENCOUNTER — Ambulatory Visit (INDEPENDENT_AMBULATORY_CARE_PROVIDER_SITE_OTHER): Payer: Medicare Other | Admitting: Family Medicine

## 2011-10-28 VITALS — BP 120/82 | HR 88 | Resp 16 | Ht 61.0 in | Wt 264.0 lb

## 2011-10-28 DIAGNOSIS — R059 Cough, unspecified: Secondary | ICD-10-CM | POA: Insufficient documentation

## 2011-10-28 DIAGNOSIS — R05 Cough: Secondary | ICD-10-CM | POA: Insufficient documentation

## 2011-10-28 DIAGNOSIS — I10 Essential (primary) hypertension: Secondary | ICD-10-CM

## 2011-10-28 DIAGNOSIS — E119 Type 2 diabetes mellitus without complications: Secondary | ICD-10-CM

## 2011-10-28 DIAGNOSIS — J45909 Unspecified asthma, uncomplicated: Secondary | ICD-10-CM | POA: Insufficient documentation

## 2011-10-28 DIAGNOSIS — E1149 Type 2 diabetes mellitus with other diabetic neurological complication: Secondary | ICD-10-CM | POA: Insufficient documentation

## 2011-10-28 DIAGNOSIS — R0683 Snoring: Secondary | ICD-10-CM

## 2011-10-28 DIAGNOSIS — G4730 Sleep apnea, unspecified: Secondary | ICD-10-CM

## 2011-10-28 MED ORDER — HYDROCHLOROTHIAZIDE 25 MG PO TABS: 25.0000 mg | ORAL_TABLET | Freq: Every day | ORAL | Status: DC

## 2011-10-28 MED ORDER — METFORMIN HCL ER 500 MG PO TB24: 500.0000 mg | ORAL_TABLET | Freq: Every day | ORAL | Status: DC

## 2011-10-28 NOTE — Assessment & Plan Note (Signed)
Trial off of ACE inhibitor. Will obtain chest x-ray. Will followup in 4 weeks

## 2011-10-28 NOTE — Assessment & Plan Note (Signed)
New diagnosis for patient. She was diagnosed very early. She is at risk with her  hypertension and her obesity. She was taught how to use a blood glucose meter in the office today by the nurse. She was given handouts for diabetes as well as a log book. I will have her check fasting blood sugars right now to get her used to doing so. She will start metformin 500 mg once a day. She will be set up for nutrition classes.

## 2011-10-28 NOTE — Patient Instructions (Signed)
Take your blood sugars in the morning fasting and record  Stop the lisinopril Start the HCTZ once a day for blood pressure Start the Metformin once a day  F/U  4 weeks

## 2011-10-28 NOTE — Assessment & Plan Note (Signed)
Will DC lisinopril for now secondary to cough. HCTZ will be increased to 25 mg daily

## 2011-10-28 NOTE — Assessment & Plan Note (Signed)
She probably has developed some sleep apnea with her increased weight gain and her other comorbidities. Her sleep study was borderline greater than 5 years ago. I think that this should be repeated.

## 2011-10-28 NOTE — Progress Notes (Signed)
  Subjective:    Patient ID: Yesenia [name], female    DOB: 01/13/1951, 61 y.o.   MRN: [id number]  HPI   Pt here for new diagnosis of Diabetes Mellitus, pt had elevated fasting glucose, A1C was 6.5%. She has presented with her new glucometer for diabetic teaching .  She is concerned about chronic dry hacking cough for greater than 6 months. She noticed it initially when she started lisinopril HCTZ which she has been on for quite some time. He states that the cough is worsening. She is exposed to secondhand smoke. She has an albuterol inhaler which she has used over the past few weeks but it does not give complete relief. She also tells me that she snores a lot and her son has noted this to her. She had a sleep study which was borderline in 2004 for sleep apnea.  Review of Systems   GEN- denies fatigue, fever, weight loss,weakness, recent illness HEENT- denies eye drainage, change in vision, nasal discharge, CVS- denies chest pain, palpitations RESP- denies SOB,+ cough, wheeze ABD- denies N/V, change in stools, abd pain GU- denies dysuria, hematuria, dribbling, incontinence MSK- denies joint pain, +muscle aches, injury Neuro- denies headache, dizziness, syncope, seizure activity      Objective:   Physical Exam GEN- NAD, alert and oriented x3 CVS- RRR, no murmur RESP-CTAB EXT- No edema Pulses- Radial, DP- 2+        Assessment & Plan:

## 2011-11-02 ENCOUNTER — Telehealth (HOSPITAL_COMMUNITY): Payer: Self-pay | Admitting: Dietician

## 2011-11-02 NOTE — Telephone Encounter (Signed)
Received referral via fax from Dr. Jeanice Lim [facility]) for dx: obesity, DM.

## 2011-11-02 NOTE — Telephone Encounter (Signed)
Appointment scheduled for 11/11/11 at 9:00 AM.

## 2011-11-02 NOTE — Telephone Encounter (Signed)
Mailed appointment confirmation letter and instructions for appointment scheduled 11/11/11 at 9:00 AM via [place] Mail.

## 2011-11-09 ENCOUNTER — Telehealth: Payer: Self-pay | Admitting: Family Medicine

## 2011-11-09 MED ORDER — PROMETHAZINE HCL 12.5 MG PO TABS: 12.5000 mg | ORAL_TABLET | Freq: Four times a day (QID) | ORAL | Status: DC | PRN

## 2011-11-09 NOTE — Telephone Encounter (Signed)
Phenergan sent, hold metformin until taking good fluid intake and diarrhea resolves. Keep checking blood sugar call if greater than 200

## 2011-11-09 NOTE — Telephone Encounter (Signed)
PATIENT AWARE

## 2011-11-09 NOTE — Telephone Encounter (Signed)
Patient woke up during the night with diarrhea and extreme nausea and she has been 6 times and hasn't thrown up yet but feels like she is going to. Has some chills and also stomach pain. Wants to know if something can be sent in to her at [facility]. Please advise

## 2011-11-11 ENCOUNTER — Telehealth (HOSPITAL_COMMUNITY): Payer: Self-pay | Admitting: Dietician

## 2011-11-11 NOTE — Telephone Encounter (Signed)
Pt unable to make scheduled appointment (11/11/11 at 9:00 AM) due to illness. Requests rescheduling appointment. Offered appointment on 11/20/11 at 9:00 AM.

## 2011-11-17 ENCOUNTER — Ambulatory Visit (INDEPENDENT_AMBULATORY_CARE_PROVIDER_SITE_OTHER): Payer: Medicare Other | Admitting: Family Medicine

## 2011-11-17 ENCOUNTER — Ambulatory Visit (HOSPITAL_COMMUNITY)
Admission: RE | Admit: 2011-11-17 | Discharge: 2011-11-17 | Disposition: A | Discharge status: Home / Self Care | Payer: PRIVATE HEALTH INSURANCE | Source: Ambulatory Visit | Attending: Family Medicine | Admitting: Family Medicine

## 2011-11-17 ENCOUNTER — Encounter: Payer: Self-pay | Admitting: Family Medicine

## 2011-11-17 VITALS — BP 110/62 | HR 82 | Temp 97.4°F | Resp 20 | Ht 61.0 in | Wt 264.1 lb

## 2011-11-17 DIAGNOSIS — R05 Cough: Secondary | ICD-10-CM

## 2011-11-17 DIAGNOSIS — R109 Unspecified abdominal pain: Secondary | ICD-10-CM

## 2011-11-17 DIAGNOSIS — R1031 Right lower quadrant pain: Secondary | ICD-10-CM

## 2011-11-17 DIAGNOSIS — R059 Cough, unspecified: Secondary | ICD-10-CM

## 2011-11-17 DIAGNOSIS — K5730 Diverticulosis of large intestine without perforation or abscess without bleeding: Secondary | ICD-10-CM | POA: Insufficient documentation

## 2011-11-17 DIAGNOSIS — R110 Nausea: Secondary | ICD-10-CM

## 2011-11-17 DIAGNOSIS — K7689 Other specified diseases of liver: Secondary | ICD-10-CM | POA: Insufficient documentation

## 2011-11-17 MED ORDER — PROMETHAZINE HCL 12.5 MG PO TABS: 12.5000 mg | ORAL_TABLET | Freq: Four times a day (QID) | ORAL | Status: DC | PRN

## 2011-11-17 NOTE — Progress Notes (Signed)
  Subjective:    Patient ID: Yesenia [name], female    DOB: 17-Aug-1950, 61 y.o.   MRN: [id number]  HPI  Persistant cugh despite discontinuing ACEI, has been using albuterol which helps some but cough returns, using diabetic tussin without relief, neg CXR. Cough with minimal production  Nausea and abdominal pain x 1 week, taking phenergan, diarrhea initially now resolved, pain in RLQ, non radiating, sharp, denies dysuria  DM- newly diagnosed, CBG fasting 96-155 taking Metformin   Review of Systems  GEN- denies fatigue, fever, weight loss,weakness, recent illness HEENT- denies eye drainage, change in vision, nasal discharge, CVS- denies chest pain, palpitations RESP- denies SOB, +cough, wheeze ABD- + N/ denies V, change in stools, +abd pain GU- denies dysuria, hematuria, dribbling, incontinence MSK- denies joint pain, muscle aches, injury Neuro- denies headache, dizziness, syncope, seizure activity      Objective:   Physical Exam GEN- NAD, alert and oriented x3, fatigued appearing HEENT- PERRL, EOMI, non injected sclera, pink conjunctiva, MMM, oropharynx clear Neck- no LAD CVS- RRR, no murmur RESP-CTAB ABD-NABS,soft,TTP RLQ, no guarding, no rebound, no mass felt  EXT- No edema Pulses- Radial, DP- 2+        Assessment & Plan:

## 2011-11-17 NOTE — Patient Instructions (Addendum)
CT Scan to be done  You can order a bracelet but not necessary at this time Hold the Metformin for 1 week  Stop diabetic tussin for cough  Referral to pulmonary for cough Keep previous f/u appt

## 2011-11-18 ENCOUNTER — Encounter: Payer: Self-pay | Admitting: Family Medicine

## 2011-11-18 ENCOUNTER — Telehealth (HOSPITAL_COMMUNITY): Payer: Self-pay | Admitting: Dietician

## 2011-11-18 DIAGNOSIS — R109 Unspecified abdominal pain: Secondary | ICD-10-CM | POA: Insufficient documentation

## 2011-11-18 DIAGNOSIS — R110 Nausea: Secondary | ICD-10-CM | POA: Insufficient documentation

## 2011-11-18 NOTE — Assessment & Plan Note (Signed)
Sent for CT after visit, ? Appendicitis based on exam, vs colitis CT revealed no acute process, supportive care, s/p hysterectomy and gallbladder, removal

## 2011-11-18 NOTE — Assessment & Plan Note (Signed)
Push fluids, anti-emetic

## 2011-11-18 NOTE — Assessment & Plan Note (Signed)
Prolonged cough, refer to pulmonary, symptoms > 6 months, minimal relief, off ACEI, or with bronchodilator, on PPI

## 2011-11-18 NOTE — Telephone Encounter (Signed)
Appointment rescheduled for 11/27/11 at 11:00 AM.

## 2011-11-20 ENCOUNTER — Telehealth (HOSPITAL_COMMUNITY): Payer: Self-pay | Admitting: Dietician

## 2011-11-20 NOTE — Telephone Encounter (Signed)
Mailed appointment confirmation letter and instructions for appointment scheduled 11/27/11 at 11:00 AM via [place] [employer].

## 2011-11-24 ENCOUNTER — Encounter: Payer: Self-pay | Admitting: Family Medicine

## 2011-11-24 ENCOUNTER — Ambulatory Visit (INDEPENDENT_AMBULATORY_CARE_PROVIDER_SITE_OTHER): Payer: Medicare Other | Admitting: Family Medicine

## 2011-11-24 VITALS — BP 114/80 | HR 72 | Resp 16 | Ht 61.0 in | Wt 266.4 lb

## 2011-11-24 DIAGNOSIS — R109 Unspecified abdominal pain: Secondary | ICD-10-CM

## 2011-11-24 DIAGNOSIS — E119 Type 2 diabetes mellitus without complications: Secondary | ICD-10-CM

## 2011-11-24 NOTE — Patient Instructions (Signed)
Restart Metformin We will call with appt for lung doctor F/U nutritionist  Keep October appointment

## 2011-11-24 NOTE — Progress Notes (Signed)
  Subjective:    Patient ID: Yesenia [name], female    DOB: 26-Sep-1950, 61 y.o.   MRN: [id number]  HPI Patient presents to followup acute illness. She was treated last week with diarrhea, nausea and abdominal pain CT scan did not show any acute abnormality but fatty liver was noted. Her symptoms have now resolved and she is doing much better. She has been holding the metformin as advised. She is a pulmonary referral pending for her cough. She has no specific concerns today.   Review of Systems   GEN- denies fatigue, fever, weight loss,weakness, recent illness HEENT- denies eye drainage, change in vision, nasal discharge, CVS- denies chest pain, palpitations RESP- denies SOB, +cough, wheeze ABD- denies N/V, change in stools, abd pain GU- denies dysuria, hematuria, dribbling, incontinence MSK- denies joint pain, muscle aches, injury Neuro- denies headache, dizziness, syncope, seizure activity      Objective:   Physical Exam GEN- NAD, alert and oriented x3,  CVS- RRR, no murmur RESP-CTAB ABD-NABS,soft,NT,ND EXT- No edema Pulses- Radial, DP- 2+        Assessment & Plan:

## 2011-11-24 NOTE — Assessment & Plan Note (Signed)
Likley viral in nature may have been exacerbated by the Metformin. Now resolved

## 2011-11-24 NOTE — Assessment & Plan Note (Signed)
Restart Metformin, if she has recurrence of abd pain, diarrhea, will d/c and place on glipizide

## 2011-11-25 ENCOUNTER — Telehealth: Payer: Self-pay | Admitting: Family Medicine

## 2011-11-27 ENCOUNTER — Encounter (HOSPITAL_COMMUNITY): Payer: Self-pay | Admitting: Dietician

## 2011-11-27 NOTE — Progress Notes (Signed)
Outpatient Initial Nutrition Assessment  Date:11/27/2011   Appt Start Time: 10:42 AM  Referring Physician: Dr. Jeanice Lim Reason for Visit: obesity, new onset diabetes  Nutrition Assessment:  Height: 5\' 1"  (154.9 cm)   Weight: 270 lb (122.471 kg)   IBW: 105# %IBW: 257% UBW: 270# %UBW: 100% Body mass index is 51.02 kg/(m^2).  Goal Weight: 250# (per pt) Weight hx: Pt reports UBW between 260-270#. She reports he highest weight was 277# a few months ago. Her lowest weight was 112# 43 years ago.   Estimated nutritional needs: 2017-2201 kcals daily, 98-123 grams protein daily, 2.0-2.2 L fluid daily  PMH:  Past Medical History  Diagnosis Date  . Thyroid disease   . Hypertension   . Anxiety   . GERD (gastroesophageal reflux disease)   . DDD (degenerative disc disease), lumbar   . Facet arthritis of lumbar region   . S/P cardiac cath [id number]    Normal per report  . Status post placement of implantable loop recorder     Removed 2004  . Carpal tunnel syndrome   . Kidney stones   . Osteoporosis   . Fibromyalgia   . Diabetes mellitus 2013  . Fatty liver     Medications:  Current Outpatient Rx  Name Route Sig Dispense Refill  . ALBUTEROL SULFATE HFA 108 (90 BASE) MCG/ACT IN AERS Inhalation Inhale 2 puffs into the lungs every 6 (six) hours as needed.    . ALPRAZOLAM 0.5 MG PO TABS Oral Take 0.5 mg by mouth 4 (four) times daily as needed. Filled by Psych    . CLOTRIMAZOLE-BETAMETHASONE 1-0.05 % EX CREA  Apply to affected area 2 times daily 15 g 1  . DEXILANT 60 MG PO CPDR  TAKE 1 CAPSULE BY MOUTH ONCE DAILY. 31 capsule 11  . FORA LANCETS MISC Does not apply by Does not apply route. Once daily testing Dx 250.00    . GABAPENTIN 300 MG PO CAPS Oral Take 1 capsule (300 mg total) by mouth 3 (three) times daily. 90 capsule 2  . GLUCOSE BLOOD VI STRP Other 1 each by Other route daily. Once daily testing    . HYDROCHLOROTHIAZIDE 25 MG PO TABS Oral Take 1 tablet (25 mg total) by mouth daily. 30  tablet 0  . LEVOTHYROXINE SODIUM 75 MCG PO TABS Oral Take 1 tablet (75 mcg total) by mouth daily. 30 tablet 2  . LISINOPRIL-HYDROCHLOROTHIAZIDE 10-12.5 MG PO TABS Oral Take 1 tablet by mouth daily. 30 tablet 3    Please deliver medications  . METFORMIN HCL ER 500 MG PO TB24 Oral Take 1 tablet (500 mg total) by mouth daily with breakfast. 30 tablet 3  . PROMETHAZINE HCL 12.5 MG PO TABS Oral Take 1 tablet (12.5 mg total) by mouth every 6 (six) hours as needed. 30 tablet 1  . TRAMADOL HCL 50 MG PO TABS Oral Take 1 tablet (50 mg total) by mouth every 6 (six) hours as needed. 20 tablet 3  . TRAZODONE HCL 100 MG PO TABS Oral Take 200 mg by mouth at bedtime. Filled by psych      Labs: CMP     Component Value Date/Time   NA 141 09/08/2011 0923   K 4.3 09/08/2011 0923   CL 106 09/08/2011 0923   CO2 25 09/08/2011 0923   GLUCOSE 101* 09/08/2011 0923   BUN 15 09/08/2011 0923   CREATININE 0.86 09/08/2011 0923   CALCIUM 9.6 09/08/2011 0923   PROT 7.1 09/08/2011 0923   ALBUMIN 4.0 09/08/2011  0923   AST 23 09/08/2011 0923   ALT 15 09/08/2011 0923   ALKPHOS 73 09/08/2011 0923   BILITOT 0.4 09/08/2011 0923    Lipid Panel     Component Value Date/Time   CHOL 177 09/08/2011 0923   TRIG 98 09/08/2011 0923   HDL 47 09/08/2011 0923   CHOLHDL 3.8 09/08/2011 0923   VLDL 20 09/08/2011 0923   LDLCALC 110* 09/08/2011 0923     Lab Results  Component Value Date   HGBA1C 6.5* 10/20/2011   Lab Results  Component Value Date   LDLCALC 110* 09/08/2011   CREATININE 0.86 09/08/2011     Lifestyle/ social habits: [name] lives alone in [place], [place]. She has 2 adult sons who live nearby. She is a former Health visitor. She has been on disability since 2007. She does not participate in physical activity. She reports her stress level as "high" as of late, due to her mother recently being diagnosed with cancer. She has been checking AM fasting glucose readings daily, achieving readings of 113-125 on average. She reports that she has only achievde  a "high" reading of 150 once.   Nutrition hx/habits: [name] reports that she has cut out ice cream and dark sodas. She eats 2 meals a day and skips breakfast. She struggles with meal planning, as she lives alone. She does not see the point in cooking "a big meal", so she usually eats sandwiches. She reports she will occasionally use her crockpot. She drinks mostly water, sweetened tea, and regular sodas. She reports she consumes beverages throughout the day. She goes out to eat 1-2 times per month at a [employer].   Diet recall: Lunch (12:30-1:00 PM): sanwich (white bread, ham or [place], mayo, mustard, tomato, cheese), beverage (water, sweetened tea, or regular soda); Dinner (6:30 PM): same as lunch. She reports she drinks beverages (water, sweetened tea, and regular soda) throughout the day.   Nutrition Diagnosis: Inconsistent carbohydrate intake r/t skipping meals AEB skipping breakfast, Hgb A1c (6.5), new dx of diabetes.   Nutrition Intervention: Nutrition rx: 1500 kcal NAS, low fat, diabetic diet; 3 meals per day (45-60 grams carbs per meal); no snacks; low calorie beverages only; physical activity as tolerated  Education/Counseling Provided: Educated pt on diabetic diet. Emphasized importance of self-monitoring to control diabetes to prevent complications. Discussed importance of compliance of treatment plan to prevent complications. Educated pt on plate method, portion sizes, sources of carbohydrate, and carbohydrate counting. Educated pt on choosing whole fruits and vegetables, whole grains, low fat dairy, and lean meats more often in diet. Educated pt on how to read and interpret nutrition information on food labels. Discussed ways to reduced sodium in diet by avoiding processed foods and avoid adding salt at the table and while cooking. Discussed nutrition contents of commonly eaten foods and suggested healthier alternatives. Discussed options for low calorie beverages. Provided  [place] and Splenda samples (pt refused Sweet and Low samples). Provided plate method handout and AND Nutrition Care Manual Handouts re: type diabetes ("Type 2 Diabetes Nutrition Therapy" and "Label Reading For Type 2 Diabetes").   Understanding, Motivation, Ability to Follow Recommendations: Expect fair to good compliance.   Monitoring and Evaluation: Goals: 1) 1-2# weight loss per week; 2) Physical activity as tolerated; 3) Hgb A1c < 6.5; 4) 3 meals per day  Recommendations: 1) For weight loss: 1517-1701 kcals daily; 2) Purchase lean meats, limit fried foods, and take skin and visible fat off meats; 3) Use artifical sweeteners or  low calorie versions of beverages; 4) Use measuring cups to ensure proper portion sizes  F/U: PRN. Provided RD contact information.   Melody Haver, RD, LDN 11/27/2011  Appt End Time: 11:55 AM

## 2011-11-27 NOTE — Telephone Encounter (Signed)
Patient is aware 

## 2011-12-15 ENCOUNTER — Other Ambulatory Visit: Payer: Self-pay | Admitting: Family Medicine

## 2011-12-18 ENCOUNTER — Other Ambulatory Visit: Payer: Self-pay | Admitting: Family Medicine

## 2011-12-24 ENCOUNTER — Institutional Professional Consult (permissible substitution): Payer: Medicare Other | Admitting: Internal Medicine

## 2011-12-28 ENCOUNTER — Encounter: Payer: Self-pay | Admitting: Family Medicine

## 2011-12-28 ENCOUNTER — Ambulatory Visit (INDEPENDENT_AMBULATORY_CARE_PROVIDER_SITE_OTHER): Payer: Medicare Other | Admitting: Family Medicine

## 2011-12-28 VITALS — BP 126/68 | HR 93 | Resp 18 | Ht 61.0 in | Wt 276.1 lb

## 2011-12-28 DIAGNOSIS — I10 Essential (primary) hypertension: Secondary | ICD-10-CM

## 2011-12-28 DIAGNOSIS — R21 Rash and other nonspecific skin eruption: Secondary | ICD-10-CM

## 2011-12-28 DIAGNOSIS — M549 Dorsalgia, unspecified: Secondary | ICD-10-CM

## 2011-12-28 DIAGNOSIS — M48061 Spinal stenosis, lumbar region without neurogenic claudication: Secondary | ICD-10-CM

## 2011-12-28 DIAGNOSIS — R609 Edema, unspecified: Secondary | ICD-10-CM

## 2011-12-28 DIAGNOSIS — R600 Localized edema: Secondary | ICD-10-CM

## 2011-12-28 DIAGNOSIS — Z23 Encounter for immunization: Secondary | ICD-10-CM

## 2011-12-28 MED ORDER — HYDROCHLOROTHIAZIDE 25 MG PO TABS: 25.0000 mg | ORAL_TABLET | Freq: Every day | ORAL | Status: DC

## 2011-12-28 MED ORDER — TRAMADOL HCL 50 MG PO TABS: 50.0000 mg | ORAL_TABLET | Freq: Four times a day (QID) | ORAL | Status: DC | PRN

## 2011-12-28 MED ORDER — LEVOTHYROXINE SODIUM 75 MCG PO TABS: 75.0000 ug | ORAL_TABLET | Freq: Every day | ORAL | Status: DC

## 2011-12-28 MED ORDER — GABAPENTIN 300 MG PO CAPS: ORAL_CAPSULE | ORAL | Status: DC

## 2011-12-28 MED ORDER — CLOTRIMAZOLE-BETAMETHASONE 1-0.05 % EX CREA: TOPICAL_CREAM | CUTANEOUS | Status: DC

## 2011-12-28 MED ORDER — DIAZEPAM 5 MG PO TABS: ORAL_TABLET | ORAL | Status: DC

## 2011-12-28 NOTE — Progress Notes (Signed)
  Subjective:    Patient ID: Yesenia [name], female    DOB: [birth year], 61 y.o.   MRN: [id number]  HPI Pt here secondary to leg pain on right side worse past few weeks with worsening back pain.  Rash on neck has returned, out of previous cream Swelling in ankles past 2 weeks, has swelling on and off, has been off of HCTZ for at least 1 month   Review of Systems  GEN- denies fatigue, fever, weight loss,weakness, recent illness HEENT- denies eye drainage, change in vision, nasal discharge, CVS- denies chest pain, palpitations RESP- denies SOB, cough, wheeze ABD- denies N/V, change in stools, abd pain GU- denies dysuria, hematuria, dribbling, incontinence MSK- + joint pain, muscle aches, injury Neuro- denies headache, dizziness, syncope, seizure activity      Objective:   Physical Exam GEN- NAD, alert and oriented x3 HEENT- PERRL, EOMI, non injected sclera, pink conjunctiva, MMM, oropharynx clear Neck- Supple, no thryomegaly CVS- RRR, no murmur RESP-CTAB EXT- 1+ edema  Pulses- Radial, DP- 2+ Skin- erythematous macular rash on neck, no pustules, erythema with excoriations on left shin        Assessment & Plan:

## 2011-12-28 NOTE — Patient Instructions (Signed)
MRI of back to be set up- referral to neurosuregery  Increase neurontin to 600mg  at bedtime Continue ultram Cream for rash on neck and leg Restart HCTZ - water pill if your legs do not improve please call Elevate your legs Continue Metformin  Change F/U to 2 months

## 2011-12-30 ENCOUNTER — Telehealth: Payer: Self-pay | Admitting: Family Medicine

## 2011-12-30 NOTE — Telephone Encounter (Signed)
Patient is aware 

## 2012-01-01 ENCOUNTER — Telehealth: Payer: Self-pay | Admitting: Family Medicine

## 2012-01-01 DIAGNOSIS — R21 Rash and other nonspecific skin eruption: Secondary | ICD-10-CM | POA: Insufficient documentation

## 2012-01-01 DIAGNOSIS — R600 Localized edema: Secondary | ICD-10-CM | POA: Insufficient documentation

## 2012-01-01 NOTE — Telephone Encounter (Signed)
I sent the valium script, you can call pharmacy and see if they got it, it is on med list

## 2012-01-01 NOTE — Assessment & Plan Note (Signed)
Well controlled 

## 2012-01-01 NOTE — Assessment & Plan Note (Signed)
Recurrent intertriginous rash on neck, retreat with lotrisone

## 2012-01-01 NOTE — Assessment & Plan Note (Addendum)
I will send for repeat MRI, she has worsening pain, and known DDD as well with some radicular symptoms Neurosurgery referral to be sent again Increase neurontin to 600mg  at bedtime

## 2012-01-01 NOTE — Telephone Encounter (Signed)
This was called into the pharamcy

## 2012-01-01 NOTE — Telephone Encounter (Signed)
Please advise 

## 2012-01-01 NOTE — Assessment & Plan Note (Signed)
deteriorated off HCTZ, will restart

## 2012-01-04 ENCOUNTER — Ambulatory Visit (HOSPITAL_COMMUNITY)
Admission: RE | Admit: 2012-01-04 | Discharge: 2012-01-04 | Disposition: A | Discharge status: Home / Self Care | Payer: PRIVATE HEALTH INSURANCE | Source: Ambulatory Visit | Attending: Family Medicine | Admitting: Family Medicine

## 2012-01-04 DIAGNOSIS — M79609 Pain in unspecified limb: Secondary | ICD-10-CM | POA: Insufficient documentation

## 2012-01-04 DIAGNOSIS — M5126 Other intervertebral disc displacement, lumbar region: Secondary | ICD-10-CM | POA: Insufficient documentation

## 2012-01-04 DIAGNOSIS — M549 Dorsalgia, unspecified: Secondary | ICD-10-CM

## 2012-01-04 DIAGNOSIS — M545 Low back pain, unspecified: Secondary | ICD-10-CM | POA: Insufficient documentation

## 2012-01-04 DIAGNOSIS — M5124 Other intervertebral disc displacement, thoracic region: Secondary | ICD-10-CM | POA: Insufficient documentation

## 2012-01-12 ENCOUNTER — Emergency Department (HOSPITAL_COMMUNITY)
Admission: EM | Admit: 2012-01-12 | Discharge: 2012-01-12 | Disposition: A | Discharge status: Home / Self Care | Payer: Medicare Other | Attending: Emergency Medicine | Admitting: Emergency Medicine

## 2012-01-12 ENCOUNTER — Encounter (HOSPITAL_COMMUNITY): Payer: Self-pay | Admitting: Emergency Medicine

## 2012-01-12 ENCOUNTER — Emergency Department (HOSPITAL_COMMUNITY): Payer: Medicare Other

## 2012-01-12 DIAGNOSIS — E119 Type 2 diabetes mellitus without complications: Secondary | ICD-10-CM | POA: Insufficient documentation

## 2012-01-12 DIAGNOSIS — M810 Age-related osteoporosis without current pathological fracture: Secondary | ICD-10-CM | POA: Insufficient documentation

## 2012-01-12 DIAGNOSIS — IMO0002 Reserved for concepts with insufficient information to code with codable children: Secondary | ICD-10-CM | POA: Insufficient documentation

## 2012-01-12 DIAGNOSIS — Z87442 Personal history of urinary calculi: Secondary | ICD-10-CM | POA: Insufficient documentation

## 2012-01-12 DIAGNOSIS — I10 Essential (primary) hypertension: Secondary | ICD-10-CM | POA: Insufficient documentation

## 2012-01-12 DIAGNOSIS — F411 Generalized anxiety disorder: Secondary | ICD-10-CM | POA: Insufficient documentation

## 2012-01-12 DIAGNOSIS — S8000XA Contusion of unspecified knee, initial encounter: Secondary | ICD-10-CM | POA: Insufficient documentation

## 2012-01-12 DIAGNOSIS — K219 Gastro-esophageal reflux disease without esophagitis: Secondary | ICD-10-CM | POA: Insufficient documentation

## 2012-01-12 DIAGNOSIS — M25569 Pain in unspecified knee: Secondary | ICD-10-CM | POA: Insufficient documentation

## 2012-01-12 DIAGNOSIS — Z79899 Other long term (current) drug therapy: Secondary | ICD-10-CM | POA: Insufficient documentation

## 2012-01-12 DIAGNOSIS — S6390XA Sprain of unspecified part of unspecified wrist and hand, initial encounter: Secondary | ICD-10-CM | POA: Insufficient documentation

## 2012-01-12 DIAGNOSIS — W010XXA Fall on same level from slipping, tripping and stumbling without subsequent striking against object, initial encounter: Secondary | ICD-10-CM | POA: Insufficient documentation

## 2012-01-12 LAB — GLUCOSE, CAPILLARY: Glucose-Capillary: 94 mg/dL (ref 70–99)

## 2012-01-12 MED ORDER — ACETAMINOPHEN 325 MG PO TABS: 650.0000 mg | ORAL_TABLET | Freq: Once | ORAL | Status: DC

## 2012-01-12 MED ORDER — OXYCODONE-ACETAMINOPHEN 5-325 MG PO TABS
2.0000 | ORAL_TABLET | Freq: Once | ORAL | Status: AC
  Administered 2012-01-12: 2 via ORAL
  Filled 2012-01-12: qty 2

## 2012-01-12 MED ORDER — OXYCODONE-ACETAMINOPHEN 7.5-325 MG PO TABS: 1.0000 | ORAL_TABLET | ORAL | Status: DC | PRN

## 2012-01-12 NOTE — ED Notes (Signed)
cbg 94 

## 2012-01-12 NOTE — ED Notes (Signed)
Pt states she tripped and fell onto right side at 1700 yesterday. Denies hitting head/loc. Pt c/o generalized pain on right side of body including hand/knee/ribs. Nad noted.
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
## 2012-01-12 NOTE — ED Provider Notes (Signed)
Medical screening examination/treatment/procedure(s) were performed by non-physician practitioner and as supervising physician I was immediately available for consultation/collaboration. Devoria Albe, MD, Armando Gang   Ward Givens, MD 01/12/12 [phone]

## 2012-01-14 ENCOUNTER — Encounter: Payer: Self-pay | Admitting: Internal Medicine

## 2012-01-14 ENCOUNTER — Ambulatory Visit (INDEPENDENT_AMBULATORY_CARE_PROVIDER_SITE_OTHER): Payer: Medicare Other | Admitting: Internal Medicine

## 2012-01-14 VITALS — BP 128/72 | HR 70 | Temp 97.8°F | Ht 61.0 in | Wt 275.6 lb

## 2012-01-14 DIAGNOSIS — R05 Cough: Secondary | ICD-10-CM

## 2012-01-14 DIAGNOSIS — I10 Essential (primary) hypertension: Secondary | ICD-10-CM

## 2012-01-14 MED ORDER — VALSARTAN-HYDROCHLOROTHIAZIDE 160-25 MG PO TABS: 1.0000 | ORAL_TABLET | Freq: Every day | ORAL | Status: DC

## 2012-01-14 NOTE — Patient Instructions (Addendum)
Ok to take ultram (tramadol) up 100 mg every 4 hours if coughing  Stop lisinopril and start valsartn/hctz 160/25 one daily in it's place should improve over a couple of weeks or you need to return  Dexilant 60 mg Take 30-60 min before first meal of the day and pepcid 20 mg one at bedtime until the noisy breathing goes away.   GERD (REFLUX)  is an extremely common cause of respiratory symptoms, many times with no significant heartburn at all.    It can be treated with medication, but also with lifestyle changes including avoidance of late meals, excessive alcohol, smoking cessation, and avoid fatty foods, chocolate, peppermint, colas, red wine, and acidic juices such as orange juice.  NO MINT OR MENTHOL PRODUCTS SO NO COUGH DROPS  USE SUGARLESS CANDY INSTEAD (jolley ranchers or Stover's)  NO OIL BASED VITAMINS - use powdered substitutes.     If you are satisfied with your treatment plan let your doctor know and he/she can either refill your medications or you can return here when your prescription runs out.     If in any way you are not 100% satisfied,  please tell us.  If 100% better, tell your friends!

## 2012-01-14 NOTE — Progress Notes (Signed)
  Subjective:    Patient ID: Yesenia [name], female    DOB: 1950/09/18  MRN: [id number]  HPI  67 yowf never smoker with problems with allergies/ sinus esp in fall/ 1st grade -HS took otc's only and eventually resolved but then new cough June 2013 referred 01/14/2012 to pulmonary clinic by Dr Jeanice Lim.  01/14/2012  1st pulmonary eval cc indolent onset dry cough assoc with dysphagia and sensation of a "fur ball in the throat" day and night assoc with hoarseness new onset and "noisy breathing" while sleeping with neg sleep study already completed. No sob No obvious daytime variabilty or assoc   cp or chest tightness, subjective wheeze she's aware of nor overt sinus or hb symptoms. No unusual exp hx or h/o childhood pna/ asthma or premature birth to her knowledge.  If not coughing she's sleeping ok without nocturnal  or early am exacerbation  of respiratory  c/o's or need for noct saba. Also denies any obvious fluctuation of symptoms with weather or environmental changes or other aggravating or alleviating factors except as outlined above         Review of Systems  Constitutional: Negative for fever, chills and unexpected weight change.  HENT: Positive for trouble swallowing and voice change. Negative for ear pain, nosebleeds, congestion, sore throat, rhinorrhea, sneezing, dental problem, postnasal drip and sinus pressure.   Eyes: Negative for visual disturbance.  Respiratory: Positive for cough. Negative for choking and shortness of breath.   Cardiovascular: Positive for leg swelling. Negative for chest pain.  Gastrointestinal: Negative for vomiting, abdominal pain and diarrhea.  Genitourinary: Negative for difficulty urinating.  Musculoskeletal: Positive for arthralgias.  Skin: Positive for rash.  Neurological: Negative for tremors, syncope and headaches.  Hematological: Bruises/bleeds easily.       Objective:   Physical Exam Obese amb wf with classic voice fatigue Wt Readings from  Last 3 Encounters:  01/14/12 275 lb 9.6 oz (125.011 kg)  01/12/12 276 lb (125.193 kg)  12/28/11 276 lb 1.9 oz (125.247 kg)    HEENT: nl dentition, turbinates, and orophanx. Nl external ear canals without cough reflex   NECK :  without JVD/Nodes/TM/ nl carotid upstrokes bilaterally   LUNGS: no acc muscle use, clear to A and P bilaterally without cough on insp or exp maneuvers   CV:  RRR  no s3 or murmur or increase in P2, no edema   ABD:  soft and nontender with nl excursion in the supine position. No bruits or organomegaly, bowel sounds nl  MS:  warm without deformities, calf tenderness, cyanosis or clubbing  SKIN: warm and dry without lesions    NEURO:  alert, approp, no deficits     10/28/11 No active disease. Mild chronic perihilar scarring.       Assessment & Plan:

## 2012-01-14 NOTE — Assessment & Plan Note (Signed)

## 2012-01-14 NOTE — Assessment & Plan Note (Signed)
The most common causes of chronic cough in immunocompetent adults include the following: upper airway cough syndrome (UACS), previously referred to as postnasal drip syndrome (PNDS), which is caused by variety of rhinosinus conditions; (2) asthma; (3) GERD; (4) chronic bronchitis from cigarette smoking or other inhaled environmental irritants; (5) nonasthmatic eosinophilic bronchitis; and (6) bronchiectasis.   These conditions, singly or in combination, have accounted for up to 94% of the causes of chronic cough in prospective studies.   Other conditions have constituted no >6% of the causes in prospective studies These have included bronchogenic carcinoma, chronic interstitial pneumonia, sarcoidosis, left ventricular failure, ACEI-induced cough, and aspiration from a condition associated with pharyngeal dysfunction.   This is most likely  Classic Upper airway cough syndrome, so named because it's frequently impossible to sort out how much is  CR/sinusitis with freq throat clearing (which can be related to primary GERD)   vs  causing  secondary (" extra esophageal")  GERD from wide swings in gastric pressure that occur with throat clearing, often  promoting self use of mint and menthol lozenges that reduce the lower esophageal sphincter tone and exacerbate the problem further in a cyclical fashion.   These are the same pts (now being labeled as having "irritable larynx syndrome" by some cough centers) who not infrequently have a history of having failed to tolerate ace inhibitors,  dry powder inhalers or biphosphonates or report having atypical reflux symptoms that don't respond to standard doses of PPI , and are easily confused as having aecopd or asthma flares by even experienced allergists/ pulmonologists.   For now max gerd rx and try off acei then regroup one month if not better

## 2012-01-20 ENCOUNTER — Ambulatory Visit: Payer: Medicare Other | Admitting: Family Medicine

## 2012-02-26 ENCOUNTER — Other Ambulatory Visit: Payer: Self-pay | Admitting: Family Medicine

## 2012-02-29 ENCOUNTER — Other Ambulatory Visit: Payer: Self-pay

## 2012-02-29 MED ORDER — DEXLANSOPRAZOLE 60 MG PO CPDR: 60.0000 mg | DELAYED_RELEASE_CAPSULE | Freq: Every day | ORAL | Status: DC

## 2012-02-29 NOTE — Telephone Encounter (Signed)
Needs OV for further refills

## 2012-03-01 ENCOUNTER — Encounter: Payer: Self-pay | Admitting: Gastroenterology

## 2012-03-01 ENCOUNTER — Ambulatory Visit: Payer: Medicare Other | Admitting: Family Medicine

## 2012-03-01 NOTE — Telephone Encounter (Signed)
Mailed letter to patient to call our office to set up OV to further her refills °

## 2012-03-14 ENCOUNTER — Encounter: Payer: Self-pay | Admitting: Internal Medicine

## 2012-03-15 ENCOUNTER — Ambulatory Visit (INDEPENDENT_AMBULATORY_CARE_PROVIDER_SITE_OTHER): Payer: PRIVATE HEALTH INSURANCE | Admitting: Gastroenterology

## 2012-03-15 ENCOUNTER — Encounter: Payer: Self-pay | Admitting: Gastroenterology

## 2012-03-15 VITALS — BP 107/76 | HR 72 | Temp 97.4°F | Ht 61.0 in | Wt 255.4 lb

## 2012-03-15 DIAGNOSIS — K219 Gastro-esophageal reflux disease without esophagitis: Secondary | ICD-10-CM
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
## 2012-03-15 NOTE — Patient Instructions (Addendum)
Continue taking Dexilant daily.   Please review the reflux diet. Keep up the good work with the weight loss.  We will see you back in 1 year.  Have a wonderful Christmas with your mom and family.

## 2012-03-17 ENCOUNTER — Ambulatory Visit (INDEPENDENT_AMBULATORY_CARE_PROVIDER_SITE_OTHER): Payer: PRIVATE HEALTH INSURANCE | Admitting: Family Medicine

## 2012-03-17 ENCOUNTER — Encounter: Payer: Self-pay | Admitting: Family Medicine

## 2012-03-17 VITALS — BP 124/80 | HR 70 | Resp 16 | Ht 61.0 in | Wt 252.0 lb

## 2012-03-17 DIAGNOSIS — E1140 Type 2 diabetes mellitus with diabetic neuropathy, unspecified: Secondary | ICD-10-CM

## 2012-03-17 DIAGNOSIS — E1149 Type 2 diabetes mellitus with other diabetic neurological complication: Secondary | ICD-10-CM

## 2012-03-17 DIAGNOSIS — E039 Hypothyroidism, unspecified: Secondary | ICD-10-CM

## 2012-03-17 DIAGNOSIS — E119 Type 2 diabetes mellitus without complications: Secondary | ICD-10-CM

## 2012-03-17 DIAGNOSIS — E1142 Type 2 diabetes mellitus with diabetic polyneuropathy: Secondary | ICD-10-CM

## 2012-03-17 DIAGNOSIS — M48061 Spinal stenosis, lumbar region without neurogenic claudication: Secondary | ICD-10-CM

## 2012-03-17 DIAGNOSIS — I10 Essential (primary) hypertension: Secondary | ICD-10-CM

## 2012-03-17 DIAGNOSIS — G4730 Sleep apnea, unspecified: Secondary | ICD-10-CM

## 2012-03-17 LAB — BASIC METABOLIC PANEL
BUN: 11 mg/dL (ref 6–23)
Chloride: 100 mEq/L (ref 96–112)
Potassium: 3.7 mEq/L (ref 3.5–5.3)
Sodium: 140 mEq/L (ref 135–145)

## 2012-03-17 LAB — ESTIMATED GFR
GFR, Est African American: 61 mL/min
GFR, Est Non African American: 53 mL/min — ABNORMAL LOW

## 2012-03-17 LAB — CBC
HCT: 42.4 % (ref 36.0–46.0)
MCHC: 33.5 g/dL (ref 30.0–36.0)
Platelets: 243 10*3/uL (ref 150–400)
RDW: 14.9 % (ref 11.5–15.5)
WBC: 4.1 10*3/uL (ref 4.0–10.5)

## 2012-03-17 LAB — HEMOGLOBIN A1C
Hgb A1c MFr Bld: 6 % — ABNORMAL HIGH (ref <5.7)
Mean Plasma Glucose: 126 mg/dL — ABNORMAL HIGH (ref <117)

## 2012-03-17 LAB — TSH: TSH: 11.078 u[IU]/mL — ABNORMAL HIGH (ref 0.350–4.500)

## 2012-03-17 LAB — LIPID PANEL
Cholesterol: 176 mg/dL (ref 0–200)
HDL: 49 mg/dL (ref >39)
Total CHOL/HDL Ratio: 3.6 Ratio

## 2012-03-17 MED ORDER — OXYCODONE-ACETAMINOPHEN 7.5-325 MG PO TABS: 1.0000 | ORAL_TABLET | ORAL | Status: DC | PRN

## 2012-03-17 NOTE — Progress Notes (Signed)
  Subjective:    Patient ID: [name], female    DOB: 03/07/1951, 61 y.o.   MRN: [id number]  HPI  Pt here to f/u chronic medical problems overall she is doing well. She continues to have some problems with cough but it has improved. She also notices at night that she snores a lot and her mother states that she sounds like a freight train and is worried about her breathing when she sleeps. DM- fasting blood sugars have been 120s or less. Her last A1c was 6.5% in July. She would like to try Lyrica for her neuropathy is the Neurontin not been helping very much  Back pain-continues to have chronic back pain however at this time she is caring for her mother who is very ill and she is unable to undergo back surgery. This has been placed on hold. She's currently using Percocet for pain.  Form for school to be completed, on disability since 2007 for chronic back pain, depression/anxiety  Review of Systems  GEN- denies fatigue, fever, weight loss,weakness, recent illness HEENT- denies eye drainage, change in vision, nasal discharge, CVS- denies chest pain, palpitations RESP- denies SOB, +cough, wheeze ABD- denies N/V, change in stools, abd pain GU- denies dysuria, hematuria, dribbling, incontinence MSK- + joint pain, muscle aches, injury Neuro- denies headache, dizziness, syncope, seizure activity      Objective:   Physical Exam GEN- NAD, alert and oriented x3 HEENT- PERRL, EOMI, non injected sclera, pink conjunctiva, MMM, oropharynx clear Neck- Supple,  CVS- RRR, no murmur RESP-CTAB EXT- No edema Pulses- Radial, DP- 2+ Decreased monofilament  Epworth sleepiness scale- 14      Assessment & Plan:

## 2012-03-17 NOTE — Patient Instructions (Addendum)
Sleep study to be set up  Form to be mailed Get the labs done  Pain contract  Will try to switch to Lyrica  F/U 3 months

## 2012-03-18 DIAGNOSIS — E1140 Type 2 diabetes mellitus with diabetic neuropathy, unspecified: Secondary | ICD-10-CM | POA: Insufficient documentation

## 2012-03-18 MED ORDER — PREGABALIN 50 MG PO CAPS: 50.0000 mg | ORAL_CAPSULE | Freq: Three times a day (TID) | ORAL | Status: DC

## 2012-03-18 NOTE — Assessment & Plan Note (Signed)
She has lost a significant amount of weight. She may be able to come off of her metformin we will recheck her A1c today

## 2012-03-18 NOTE — Assessment & Plan Note (Signed)
She's been placed on a pain contract we will use Percocet for now until she is able to have her surgery and she is caring for her mother

## 2012-03-18 NOTE — Assessment & Plan Note (Signed)
Well controlled 

## 2012-03-18 NOTE — Assessment & Plan Note (Signed)
Will try to get lyrica covered, until then stay on neurontin

## 2012-03-18 NOTE — Assessment & Plan Note (Signed)
Epworth Sleepiness Scale suggestive of sleep apnea will obtain sleep study

## 2012-03-21 MED ORDER — LEVOTHYROXINE SODIUM 88 MCG PO TABS: 75.0000 ug | ORAL_TABLET | Freq: Every day | ORAL | Status: DC

## 2012-03-21 NOTE — Addendum Note (Signed)
Addended by: Milinda Antis F on: 03/21/2012 08:27 PM   Modules accepted: Orders

## 2012-03-22 ENCOUNTER — Encounter: Payer: Self-pay | Admitting: Gastroenterology

## 2012-03-22 NOTE — Assessment & Plan Note (Signed)
61 year old female with chronic GERD. Notes nocturnal symptoms, sleeping on pillows, +hoarseness. Wt loss efforts reported by patient; unfortunately, our scales note she has gained weight since last visit. She does report weighing in the 270s at one point, but she has lot weight since. Question an underlying component of LPR?  Aggressive wt loss efforts, diet and behavior modification Continue wt loss 1 year f/u or sooner. At that time, discuss possible updated TCS due to mother recently diagnosed with colon cancer. This will have been 3 years.

## 2012-03-23 NOTE — Progress Notes (Signed)
Faxed to PCP

## 2012-03-28 ENCOUNTER — Telehealth: Payer: Self-pay | Admitting: Family Medicine

## 2012-03-31 NOTE — Telephone Encounter (Signed)
Patient is aware 

## 2012-04-07 ENCOUNTER — Telehealth: Payer: Self-pay | Admitting: Family Medicine

## 2012-04-08 ENCOUNTER — Other Ambulatory Visit: Payer: Self-pay

## 2012-04-08 MED ORDER — OXYCODONE-ACETAMINOPHEN 7.5-325 MG PO TABS: 1.0000 | ORAL_TABLET | ORAL | Status: DC | PRN

## 2012-04-08 NOTE — Telephone Encounter (Signed)
Patient aware.

## 2012-05-10 ENCOUNTER — Other Ambulatory Visit: Payer: Self-pay | Admitting: Family Medicine

## 2012-05-17 ENCOUNTER — Ambulatory Visit (INDEPENDENT_AMBULATORY_CARE_PROVIDER_SITE_OTHER): Payer: PRIVATE HEALTH INSURANCE | Admitting: Family Medicine

## 2012-05-17 ENCOUNTER — Encounter: Payer: Self-pay | Admitting: Family Medicine

## 2012-05-17 VITALS — BP 102/64 | HR 78 | Resp 18 | Ht 61.0 in | Wt 247.0 lb

## 2012-05-17 DIAGNOSIS — N182 Chronic kidney disease, stage 2 (mild): Secondary | ICD-10-CM

## 2012-05-17 DIAGNOSIS — E119 Type 2 diabetes mellitus without complications: Secondary | ICD-10-CM

## 2012-05-17 DIAGNOSIS — E039 Hypothyroidism, unspecified: Secondary | ICD-10-CM

## 2012-05-17 DIAGNOSIS — E669 Obesity, unspecified: Secondary | ICD-10-CM

## 2012-05-17 MED ORDER — HYDROCHLOROTHIAZIDE 25 MG PO TABS: 25.0000 mg | ORAL_TABLET | Freq: Every day | ORAL | Status: DC

## 2012-05-17 MED ORDER — DEXLANSOPRAZOLE 60 MG PO CPDR: 60.0000 mg | DELAYED_RELEASE_CAPSULE | Freq: Every day | ORAL | Status: DC

## 2012-05-17 MED ORDER — GABAPENTIN 300 MG PO CAPS: ORAL_CAPSULE | ORAL | Status: DC

## 2012-05-17 MED ORDER — OXYCODONE-ACETAMINOPHEN 7.5-325 MG PO TABS: 1.0000 | ORAL_TABLET | ORAL | Status: DC | PRN

## 2012-05-17 NOTE — Assessment & Plan Note (Signed)
Very mild CKd discusses with patient due to her hypertension diabetes over the years we will continue to monitor advised her limitation he is an over-the-counter anti-inflammatories

## 2012-05-17 NOTE — Progress Notes (Signed)
  Subjective:    Patient ID: Yesenia [name], female    DOB: 01/16/1951, 62 y.o.   MRN: [id number]  HPI  Patient here for lab review. A1c is improved to 6%. She is stop the metformin as directed. Her cholesterol is much improved with an LDL of 99. She's lost another 5 pounds. She is due to have sleep study reviewed with neurologist on Friday  Review of Systems - per above      Objective:   Physical Exam  GEN-NAD,alert and oriented x 3      Assessment & Plan:

## 2012-05-17 NOTE — Patient Instructions (Signed)
We will call about the Lyrica  Stop the metformin Synthroid increased to once a day  Congratulations on the weight loss!! F/U April- 2nd week

## 2012-05-17 NOTE — Assessment & Plan Note (Signed)
She has lost 30lbs over past 6 months intentionally, continues to do well

## 2012-05-17 NOTE — Assessment & Plan Note (Addendum)
Well controlled, d/c metformin, continue with diet

## 2012-05-17 NOTE — Assessment & Plan Note (Signed)
Increase to 

## 2012-06-09 ENCOUNTER — Telehealth: Payer: Self-pay | Admitting: Family Medicine

## 2012-06-09 MED ORDER — OXYCODONE-ACETAMINOPHEN 7.5-325 MG PO TABS: 1.0000 | ORAL_TABLET | ORAL | Status: DC | PRN

## 2012-06-09 NOTE — Telephone Encounter (Signed)
Noted  

## 2012-06-16 ENCOUNTER — Ambulatory Visit: Payer: PRIVATE HEALTH INSURANCE | Admitting: Family Medicine

## 2012-07-19 ENCOUNTER — Encounter: Payer: Self-pay | Admitting: Family Medicine

## 2012-07-19 ENCOUNTER — Ambulatory Visit (INDEPENDENT_AMBULATORY_CARE_PROVIDER_SITE_OTHER): Payer: PRIVATE HEALTH INSURANCE | Admitting: Family Medicine

## 2012-07-19 VITALS — BP 118/82 | HR 71 | Resp 16 | Wt 248.4 lb

## 2012-07-19 DIAGNOSIS — E119 Type 2 diabetes mellitus without complications: Secondary | ICD-10-CM

## 2012-07-19 DIAGNOSIS — E669 Obesity, unspecified: Secondary | ICD-10-CM

## 2012-07-19 DIAGNOSIS — E1149 Type 2 diabetes mellitus with other diabetic neurological complication: Secondary | ICD-10-CM

## 2012-07-19 DIAGNOSIS — F419 Anxiety disorder, unspecified: Secondary | ICD-10-CM

## 2012-07-19 DIAGNOSIS — Z79899 Other long term (current) drug therapy: Secondary | ICD-10-CM

## 2012-07-19 DIAGNOSIS — E1142 Type 2 diabetes mellitus with diabetic polyneuropathy: Secondary | ICD-10-CM

## 2012-07-19 DIAGNOSIS — M5137 Other intervertebral disc degeneration, lumbosacral region: Secondary | ICD-10-CM

## 2012-07-19 DIAGNOSIS — M5136 Other intervertebral disc degeneration, lumbar region: Secondary | ICD-10-CM

## 2012-07-19 DIAGNOSIS — M51379 Other intervertebral disc degeneration, lumbosacral region without mention of lumbar back pain or lower extremity pain: Secondary | ICD-10-CM

## 2012-07-19 DIAGNOSIS — I10 Essential (primary) hypertension: Secondary | ICD-10-CM

## 2012-07-19 DIAGNOSIS — E039 Hypothyroidism, unspecified: Secondary | ICD-10-CM

## 2012-07-19 DIAGNOSIS — E1140 Type 2 diabetes mellitus with diabetic neuropathy, unspecified: Secondary | ICD-10-CM

## 2012-07-19 DIAGNOSIS — F411 Generalized anxiety disorder: Secondary | ICD-10-CM

## 2012-07-19 LAB — CBC
HCT: 38.7 % (ref 36.0–46.0)
Hemoglobin: 12.5 g/dL (ref 12.0–15.0)
MCH: 27.3 pg (ref 26.0–34.0)
MCHC: 32.3 g/dL (ref 30.0–36.0)
RBC: 4.58 MIL/uL (ref 3.87–5.11)

## 2012-07-19 MED ORDER — PHENTERMINE HCL 37.5 MG PO TABS: 37.5000 mg | ORAL_TABLET | Freq: Every day | ORAL | Status: DC

## 2012-07-19 MED ORDER — OXYCODONE-ACETAMINOPHEN 7.5-325 MG PO TABS: 1.0000 | ORAL_TABLET | ORAL | Status: DC | PRN

## 2012-07-19 NOTE — Assessment & Plan Note (Signed)
On pain contract Percocet refills urine drug screen to be done

## 2012-07-19 NOTE — Assessment & Plan Note (Signed)
Improved with Lyrica. 

## 2012-07-19 NOTE — Assessment & Plan Note (Signed)
She's come to a plateau with her weight however still has significant comorbidities and will benefit from weight loss. I will to give her 30-60 day trial on the Adipex. She is trying to walk some and use the bicycle if her back is causing severe pain. See instructions

## 2012-07-19 NOTE — Assessment & Plan Note (Signed)
Nurse spoke with pharmacy, changed to 

## 2012-07-19 NOTE — Assessment & Plan Note (Signed)
Followed by day [facility] she wants to start tapering off of her Xanax will leave this to her psychiatrist

## 2012-07-19 NOTE — Assessment & Plan Note (Addendum)
Well-controlled, will d/c extra HCTZ, continue Diovan HCT

## 2012-07-19 NOTE — Assessment & Plan Note (Signed)
Diet controlled. We'll recheck A1c

## 2012-07-19 NOTE — Patient Instructions (Addendum)
Continue current meds We will check on the thyroid dose Get the labs done today Start with 1/2 tablet of phentermine for 2 weeks, then increase to 1 tablet  F/U 4 weeks

## 2012-07-19 NOTE — Progress Notes (Signed)
  Subjective:    Patient ID: Yesenia [name], female    DOB: 1950/08/06, 62 y.o.   MRN: [id number]  HPI  Patient here to followup chronic medical problems. She noted that her blood sugars fasting have been low one teens to 120 she did have elevated blood sugar 147 after drinking sweet tea. She's not started to 88 mcg of Synthroid her pharmacy has still been giving her 75 mcg. She has started Lyrica a couple weeks ago and has had some improvement in her neuropathy. She's noticed it she's gone to a plateau with her weight loss and would like to try Adipex to help her continue to lose weight. She feels she is in a better place and overall her health is improving. She also inquired about her blood pressure medications.   Review of Systems   GEN- denies fatigue, fever, weight loss,weakness, recent illness HEENT- denies eye drainage, change in vision, nasal discharge, CVS- denies chest pain, palpitations RESP- denies SOB, cough, wheeze ABD- denies N/V, change in stools, abd pain GU- denies dysuria, hematuria, dribbling, incontinence MSK- + joint pain, muscle aches, injury Neuro- denies headache, dizziness, syncope, seizure activity      Objective:   Physical Exam GEN- NAD, alert and oriented x3,obese  HEENT- PERRL, EOMI, non injected sclera, pink conjunctiva, MMM, oropharynx clear Neck- Supple,  CVS- RRR, no murmur RESP-CTAB EXT- No edema Pulses- Radial, DP- 2+ Feet- no open lesions or sores       Assessment & Plan:

## 2012-07-20 LAB — MICROALBUMIN / CREATININE URINE RATIO
Creatinine, Urine: 169 mg/dL
Microalb Creat Ratio: 6.8 mg/g (ref 0.0–30.0)
Microalb, Ur: 1.15 mg/dL (ref 0.00–1.89)

## 2012-07-20 LAB — PRESCRIPTION ABUSE MONITORING 15P, URINE
Cocaine Metabolites: NEGATIVE ng/mL
Creatinine, Urine: 162.98 mg/dL (ref >20.0)
Methadone Screen, Urine: NEGATIVE ng/mL
Oxycodone Screen, Ur: NEGATIVE ng/mL
Propoxyphene: NEGATIVE ng/mL

## 2012-07-20 LAB — BASIC METABOLIC PANEL
BUN: 16 mg/dL (ref 6–23)
Chloride: 102 mEq/L (ref 96–112)
Creat: 1.04 mg/dL (ref 0.50–1.10)
Glucose, Bld: 91 mg/dL (ref 70–99)
Potassium: 3.8 mEq/L (ref 3.5–5.3)

## 2012-07-20 LAB — HEMOGLOBIN A1C: Mean Plasma Glucose: 131 mg/dL — ABNORMAL HIGH (ref <117)

## 2012-07-21 LAB — BENZODIAZEPINES (GC/LC/MS), URINE
Alprazolam (GC/LC/MS), ur confirm: 330 ng/mL — ABNORMAL HIGH
Alprazolam metabolite (GC/LC/MS), ur confirm: 281 ng/mL — ABNORMAL HIGH
Clonazepam metabolite (GC/LC/MS), ur confirm: NEGATIVE ng/mL
Flunitrazepam metabolite (GC/LC/MS), ur confirm: NEGATIVE ng/mL
Flurazepam metabolite (GC/LC/MS), ur confirm: NEGATIVE ng/mL
Lorazepam (GC/LC/MS), ur confirm: NEGATIVE ng/mL

## 2012-07-21 LAB — AMPHETAMINES (GC/LC/MS), URINE: MDMA GC/MS Conf: NEGATIVE ng/mL

## 2012-07-25 ENCOUNTER — Encounter: Payer: Self-pay | Admitting: Family Medicine

## 2012-07-26 ENCOUNTER — Other Ambulatory Visit: Payer: Self-pay | Admitting: Neurosurgery

## 2012-07-26 DIAGNOSIS — M4800 Spinal stenosis, site unspecified: Secondary | ICD-10-CM

## 2012-07-26 DIAGNOSIS — M4310 Spondylolisthesis, site unspecified: Secondary | ICD-10-CM

## 2012-07-26 DIAGNOSIS — M5410 Radiculopathy, site unspecified: Secondary | ICD-10-CM

## 2012-08-01 ENCOUNTER — Ambulatory Visit
Admission: RE | Admit: 2012-08-01 | Discharge: 2012-08-01 | Disposition: A | Discharge status: Home / Self Care | Payer: PRIVATE HEALTH INSURANCE | Source: Ambulatory Visit | Attending: Neurosurgery | Admitting: Neurosurgery

## 2012-08-01 VITALS — BP 100/58 | HR 66

## 2012-08-01 DIAGNOSIS — M5136 Other intervertebral disc degeneration, lumbar region: Secondary | ICD-10-CM

## 2012-08-01 DIAGNOSIS — M4310 Spondylolisthesis, site unspecified: Secondary | ICD-10-CM

## 2012-08-01 DIAGNOSIS — M5410 Radiculopathy, site unspecified: Secondary | ICD-10-CM

## 2012-08-01 DIAGNOSIS — M48061 Spinal stenosis, lumbar region without neurogenic claudication: Secondary | ICD-10-CM

## 2012-08-01 DIAGNOSIS — M4800 Spinal stenosis, site unspecified: Secondary | ICD-10-CM

## 2012-08-01 MED ORDER — MEPERIDINE HCL 100 MG/ML IJ SOLN
75.0000 mg | Freq: Once | INTRAMUSCULAR | Status: AC
  Administered 2012-08-01: 75 mg via INTRAMUSCULAR

## 2012-08-01 MED ORDER — ONDANSETRON HCL 4 MG/2ML IJ SOLN
4.0000 mg | Freq: Once | INTRAMUSCULAR | Status: AC
  Administered 2012-08-01: 4 mg via INTRAMUSCULAR

## 2012-08-01 MED ORDER — DIAZEPAM 5 MG PO TABS
10.0000 mg | ORAL_TABLET | Freq: Once | ORAL | Status: AC
  Administered 2012-08-01: 10 mg via ORAL

## 2012-08-01 MED ORDER — IOHEXOL 180 MG/ML  SOLN
15.0000 mL | Freq: Once | INTRAMUSCULAR | Status: AC | PRN
  Administered 2012-08-01: 15 mL via INTRATHECAL

## 2012-08-01 NOTE — Progress Notes (Signed)
Patient states she has been off Trazodone for at least two days.  Donell Sievert, RN

## 2012-08-12 ENCOUNTER — Other Ambulatory Visit: Payer: Self-pay

## 2012-08-12 MED ORDER — OXYCODONE-ACETAMINOPHEN 7.5-325 MG PO TABS: 1.0000 | ORAL_TABLET | ORAL | Status: DC | PRN

## 2012-08-16 ENCOUNTER — Encounter: Payer: Self-pay | Admitting: Family Medicine

## 2012-08-16 ENCOUNTER — Ambulatory Visit (INDEPENDENT_AMBULATORY_CARE_PROVIDER_SITE_OTHER): Payer: PRIVATE HEALTH INSURANCE | Admitting: Family Medicine

## 2012-08-16 VITALS — BP 120/82 | HR 75 | Resp 16 | Wt 244.8 lb

## 2012-08-16 DIAGNOSIS — E1149 Type 2 diabetes mellitus with other diabetic neurological complication: Secondary | ICD-10-CM

## 2012-08-16 DIAGNOSIS — R21 Rash and other nonspecific skin eruption: Secondary | ICD-10-CM

## 2012-08-16 DIAGNOSIS — E1140 Type 2 diabetes mellitus with diabetic neuropathy, unspecified: Secondary | ICD-10-CM

## 2012-08-16 DIAGNOSIS — E669 Obesity, unspecified: Secondary | ICD-10-CM

## 2012-08-16 DIAGNOSIS — E1142 Type 2 diabetes mellitus with diabetic polyneuropathy: Secondary | ICD-10-CM

## 2012-08-16 DIAGNOSIS — IMO0001 Reserved for inherently not codable concepts without codable children: Secondary | ICD-10-CM

## 2012-08-16 DIAGNOSIS — M797 Fibromyalgia: Secondary | ICD-10-CM

## 2012-08-16 DIAGNOSIS — I10 Essential (primary) hypertension: Secondary | ICD-10-CM

## 2012-08-16 DIAGNOSIS — E039 Hypothyroidism, unspecified: Secondary | ICD-10-CM

## 2012-08-16 DIAGNOSIS — Z1231 Encounter for screening mammogram for malignant neoplasm of breast: Secondary | ICD-10-CM

## 2012-08-16 MED ORDER — PREGABALIN 100 MG PO CAPS: 100.0000 mg | ORAL_CAPSULE | Freq: Three times a day (TID) | ORAL | Status: DC

## 2012-08-16 MED ORDER — CLOTRIMAZOLE-BETAMETHASONE 1-0.05 % EX CREA: 1.0000 "application " | TOPICAL_CREAM | Freq: Every day | CUTANEOUS | Status: DC | PRN

## 2012-08-16 NOTE — Patient Instructions (Addendum)
Lotrisone cream for neck Lyrica increased to 100mg  three times as day Pain medication refilled Have them send the neurosurgery notes Continue to work on weight loss Take 1/2 tablet of adipex Get the thyroid labs done in 4 weeks- You do not need to be fasting Schedule your Mammogram No fast food, limit to around 1500 calories, no sugary drinks, no soda,no french fries Keep walking, work up to 30 minutes a day on your treadmill F/U 3 months [employer]

## 2012-08-17 ENCOUNTER — Other Ambulatory Visit: Payer: Self-pay | Admitting: Neurosurgery

## 2012-08-22 NOTE — Assessment & Plan Note (Signed)
Recheck in 4 weeks, now on correct dose

## 2012-08-22 NOTE — Assessment & Plan Note (Signed)
lyrica increased

## 2012-08-22 NOTE — Progress Notes (Signed)
  Subjective:    Patient ID: Yesenia [name], female    DOB: 02-02-1951, 62 y.o.   MRN: [id number]  HPI  Pt here to f/u weight and medications in intermin, started on phentermine last visit. Trying to watch diet. Works out 20 minutes a day Continues to have neuropathy and aches from fibromyalgia worse in middle of day, would like to increase lyrica which helps.  Has appt with neurosurgery deciding on if surgery needed for back, asking for refill on pain meds To have dentures fitted  Review of Systems  GEN- denies fatigue, fever, weight loss,weakness, recent illness HEENT- denies eye drainage, change in vision, nasal discharge, CVS- denies chest pain, palpitations RESP- denies SOB, cough, wheeze MSK- +joint pain, muscle aches, injury Neuro- denies headache, dizziness, syncope, seizure activity      Objective:   Physical Exam  GEN- NAD, alert and oriented x3 HEENT- PERRL, EOMI, non injected sclera, pink conjunctiva, MMM, oropharynx clear CVS- RRR, no murmur RESP-CTAB EXT- No edema Pulses- Radial, DP- 2+ Skin- erythema, slightly raised in areas around neck, no pustules       Assessment & Plan:

## 2012-08-22 NOTE — Assessment & Plan Note (Signed)
BP still stable without extra HCTZ

## 2012-08-22 NOTE — Assessment & Plan Note (Addendum)
Has lost 4 pounds in 3 weeks, discussed proper nutrition with use of the phentermine, continue 1/2 tab

## 2012-08-22 NOTE — Assessment & Plan Note (Signed)
Treat with lotrisone, rash intertrigo

## 2012-08-22 NOTE — Assessment & Plan Note (Signed)
Increase lyrica to 100mg TID 

## 2012-09-01 ENCOUNTER — Encounter (HOSPITAL_COMMUNITY): Payer: Self-pay | Admitting: Pharmacy Technician

## 2012-09-05 ENCOUNTER — Encounter (HOSPITAL_COMMUNITY)
Admission: RE | Admit: 2012-09-05 | Discharge: 2012-09-05 | Disposition: A | Discharge status: Home / Self Care | Payer: Medicare Other | Source: Ambulatory Visit | Attending: Neurosurgery | Admitting: Neurosurgery

## 2012-09-05 ENCOUNTER — Encounter (HOSPITAL_COMMUNITY): Payer: Self-pay

## 2012-09-05 DIAGNOSIS — Z01810 Encounter for preprocedural cardiovascular examination: Secondary | ICD-10-CM | POA: Insufficient documentation

## 2012-09-05 DIAGNOSIS — Z01818 Encounter for other preprocedural examination: Secondary | ICD-10-CM | POA: Insufficient documentation

## 2012-09-05 DIAGNOSIS — Z01812 Encounter for preprocedural laboratory examination: Secondary | ICD-10-CM | POA: Insufficient documentation

## 2012-09-05 HISTORY — DX: Unspecified glaucoma: H40.9

## 2012-09-05 HISTORY — DX: Hypothyroidism, unspecified: E03.9

## 2012-09-05 HISTORY — DX: Other complications of anesthesia, initial encounter: T88.59XA

## 2012-09-05 HISTORY — DX: Personal history of other diseases of the digestive system: Z87.19

## 2012-09-05 HISTORY — DX: Pneumonia, unspecified organism: J18.9

## 2012-09-05 LAB — CBC
HCT: 35.6 % — ABNORMAL LOW (ref 36.0–46.0)
Hemoglobin: 11.6 g/dL — ABNORMAL LOW (ref 12.0–15.0)
MCH: 27.6 pg (ref 26.0–34.0)
MCHC: 32.6 g/dL (ref 30.0–36.0)
MCV: 84.6 fL (ref 78.0–100.0)

## 2012-09-05 LAB — COMPREHENSIVE METABOLIC PANEL
ALT: 10 U/L (ref 0–35)
AST: 17 U/L (ref 0–37)
Alkaline Phosphatase: 58 U/L (ref 39–117)
CO2: 30 mEq/L (ref 19–32)
Calcium: 9.7 mg/dL (ref 8.4–10.5)
GFR calc non Af Amer: 63 mL/min — ABNORMAL LOW (ref >90)
Potassium: 3.7 mEq/L (ref 3.5–5.1)
Sodium: 139 mEq/L (ref 135–145)

## 2012-09-05 LAB — TYPE AND SCREEN

## 2012-09-05 LAB — SURGICAL PCR SCREEN: Staphylococcus aureus: POSITIVE — AB

## 2012-09-05 NOTE — Pre-Procedure Instructions (Signed)
Yesenia [name]  09/05/2012   Your procedure is scheduled on:   Monday  09/12/12    Report to [facility] at 530 AM.  Call this number if you have problems the morning of surgery: [phone]   Remember:   Do not eat food or drink liquids after midnight.   Take these medicines the morning of surgery with A SIP OF WATER: ALBUTEROL, ALPRAZOLAM(XANAX), DEXILANT, SYNTHROID, PERCOCET IF NEEDED, LYRICA,  (STOP PHENTERIMNE, ASPIRIN, BLOOD THINNERS, HERBAL MEDICINES)   Do not wear jewelry, make-up or nail polish.  Do not wear lotions, powders, or perfumes. You may wear deodorant.  Do not shave 48 hours prior to surgery. Men may shave face and neck.  Do not bring valuables to the hospital.  [facility] is not responsible                   for any belongings or valuables.  Contacts, dentures or bridgework may not be worn into surgery.  Leave suitcase in the car. After surgery it may be brought to your room.  For patients admitted to the hospital, checkout time is 11:00 AM the day of  discharge.   Patients discharged the day of surgery will not be allowed to drive  home.  Name and phone number of your driver:   Special Instructions: Shower using CHG 2 nights before surgery and the night before surgery.  If you shower the day of surgery use CHG.  Use special wash - you have one bottle of CHG for all showers.  You should use approximately 1/3 of the bottle for each shower.   Please read over the following fact sheets that you were given: Pain Booklet, Coughing and Deep Breathing, Blood Transfusion Information, MRSA Information and Surgical Site Infection Prevention

## 2012-09-05 NOTE — Progress Notes (Signed)
09/05/12 1038  OBSTRUCTIVE SLEEP APNEA  Have you ever been diagnosed with sleep apnea through a sleep study? (NOT DETERMINED , NO CPAP RECOMMENDED)  Do you snore loudly (loud enough to be heard through closed doors)?  1  Do you often feel tired, fatigued, or sleepy during the daytime? 1  Has anyone observed you stop breathing during your sleep? 0  Do you have, or are you being treated for high blood pressure? 1  BMI more than 35 kg/m2? 1  Age over 62 years old? 1  Neck circumference greater than 40 cm/18 inches? 0  Gender: 0  Obstructive Sleep Apnea Score 5  Score 4 or greater  Results sent to PCP

## 2012-09-06 NOTE — Progress Notes (Signed)
Anesthesia Chart Review:  Patient is a 62 year old female scheduled for L4-5 PLIF on 09/12/12 by Dr. Lovell Sheehan.  History includes non-smoker, morbid obesity (on phentermine that is currently on hold in anticipation for surgery), HTN, DM2 diagnosed 10/2011 (A1C 6.2 on 07/19/12), GERD, anxiety, hiatal hernia, fatty liver, hypothyroidism, glaucoma, osteoporosis, fibromyalgia, nephrolithiasis.  OSA screening score was a 5. She had a loop recorder placed following a syncopal episode, but this was explanted on 08/21/04 (Dr. Graciela Husbands). She also reported a normal cardiac cath ~ 1997. She is no longer followed by a cardiologist. PCP is Dr. Milinda Antis, established since 09/08/11 with last visit on 08/22/12.  Dr. Jeanice Lim is aware of planned procedure.   She was evaluated by pulmonologist Dr. Sherene Sires in October 2013 for a chronic cough felt most likely "classic upper airway cough syndrome."   Anesthesia history: She reports it take her a long time to wake up from anesthesia.  EKG on 09/05/12 showed SR with first degree AVB, LAD, incomplete right BBB, cannot rule out anterior infarct (age undetermined).  It appears stable when compared to a previous EKG done on 06/02/11 at [facility].  CXR on 10/28/11 showed no active disease.  Mild chronic perihilar scarring.  Preoperative labs noted.  Glucose 94, Cr 0.95, AST/ALT WNL.  H/H 11.6/35.6.  T&S already done.  Patient's EKG appears stable since 05/2011.  She has no reported history of MI/CHF and no documented chest pain from her PAT visit or PCP visit last month.  Her DM is well controlled.  Notes indicate phentermine is on hold.  Dr. Jeanice Lim is also aware of planned surgery.  Patient will be evaluated by her assigned anesthesiologist on the day of surgery, but if she remains asymptomatic from a CV standpoint then I would anticipate that she could proceed as planned.  Her OSA screening score was elevated and she is morbidly obese, so she may require CPAP/Bi-pap post-operatively.     Velna Ochs [facility]/Anesthesiology Phone [phone] 09/06/2012 3:45 PM

## 2012-09-11 MED ORDER — CEFAZOLIN SODIUM-DEXTROSE 2-3 GM-% IV SOLR
2.0000 g | INTRAVENOUS | Status: AC
  Administered 2012-09-12: 2 g via INTRAVENOUS
  Filled 2012-09-11: qty 50

## 2012-09-12 ENCOUNTER — Inpatient Hospital Stay (HOSPITAL_COMMUNITY): Payer: PRIVATE HEALTH INSURANCE | Admitting: Anesthesiology

## 2012-09-12 ENCOUNTER — Inpatient Hospital Stay (HOSPITAL_COMMUNITY): Payer: PRIVATE HEALTH INSURANCE

## 2012-09-12 ENCOUNTER — Encounter (HOSPITAL_COMMUNITY): Payer: Self-pay | Admitting: Vascular Surgery

## 2012-09-12 ENCOUNTER — Inpatient Hospital Stay (HOSPITAL_COMMUNITY)
Admission: RE | Admit: 2012-09-12 | Discharge: 2012-09-18 | DRG: 460 | Disposition: A | Discharge status: Home Health | Payer: PRIVATE HEALTH INSURANCE | Source: Ambulatory Visit | Attending: Neurosurgery | Admitting: Neurosurgery

## 2012-09-12 ENCOUNTER — Encounter (HOSPITAL_COMMUNITY): Payer: Self-pay | Admitting: *Deleted

## 2012-09-12 ENCOUNTER — Encounter (HOSPITAL_COMMUNITY)
Admission: RE | Disposition: A | Discharge status: Home Health | Payer: Self-pay | Source: Ambulatory Visit | Attending: Neurosurgery

## 2012-09-12 DIAGNOSIS — I10 Essential (primary) hypertension: Secondary | ICD-10-CM | POA: Diagnosis present

## 2012-09-12 DIAGNOSIS — Z01812 Encounter for preprocedural laboratory examination: Secondary | ICD-10-CM

## 2012-09-12 DIAGNOSIS — K219 Gastro-esophageal reflux disease without esophagitis: Secondary | ICD-10-CM | POA: Diagnosis present

## 2012-09-12 DIAGNOSIS — M5137 Other intervertebral disc degeneration, lumbosacral region: Secondary | ICD-10-CM | POA: Diagnosis present

## 2012-09-12 DIAGNOSIS — M48061 Spinal stenosis, lumbar region without neurogenic claudication: Secondary | ICD-10-CM

## 2012-09-12 DIAGNOSIS — R509 Fever, unspecified: Secondary | ICD-10-CM | POA: Diagnosis not present

## 2012-09-12 DIAGNOSIS — H409 Unspecified glaucoma: Secondary | ICD-10-CM | POA: Diagnosis present

## 2012-09-12 DIAGNOSIS — M4310 Spondylolisthesis, site unspecified: Principal | ICD-10-CM | POA: Diagnosis present

## 2012-09-12 DIAGNOSIS — Z79899 Other long term (current) drug therapy: Secondary | ICD-10-CM

## 2012-09-12 DIAGNOSIS — M51379 Other intervertebral disc degeneration, lumbosacral region without mention of lumbar back pain or lower extremity pain: Secondary | ICD-10-CM | POA: Diagnosis present

## 2012-09-12 DIAGNOSIS — F411 Generalized anxiety disorder: Secondary | ICD-10-CM | POA: Diagnosis present

## 2012-09-12 DIAGNOSIS — Z6841 Body Mass Index (BMI) 40.0 and over, adult: Secondary | ICD-10-CM

## 2012-09-12 DIAGNOSIS — E119 Type 2 diabetes mellitus without complications: Secondary | ICD-10-CM | POA: Diagnosis present

## 2012-09-12 DIAGNOSIS — E039 Hypothyroidism, unspecified: Secondary | ICD-10-CM | POA: Diagnosis present

## 2012-09-12 DIAGNOSIS — Z01810 Encounter for preprocedural cardiovascular examination: Secondary | ICD-10-CM

## 2012-09-12 HISTORY — PX: LUMBAR SPINE SURGERY: SHX701

## 2012-09-12 LAB — GLUCOSE, CAPILLARY: Glucose-Capillary: 75 mg/dL (ref 70–99)

## 2012-09-12 SURGERY — POSTERIOR LUMBAR FUSION 1 LEVEL
Anesthesia: General | Wound class: Clean

## 2012-09-12 MED ORDER — DIPHENHYDRAMINE HCL 12.5 MG/5ML PO ELIX: 12.5000 mg | ORAL_SOLUTION | Freq: Four times a day (QID) | ORAL | Status: DC | PRN

## 2012-09-12 MED ORDER — VALSARTAN-HYDROCHLOROTHIAZIDE 160-25 MG PO TABS: 1.0000 | ORAL_TABLET | Freq: Every day | ORAL | Status: DC

## 2012-09-12 MED ORDER — ALBUTEROL SULFATE HFA 108 (90 BASE) MCG/ACT IN AERS
2.0000 | INHALATION_SPRAY | Freq: Four times a day (QID) | RESPIRATORY_TRACT | Status: DC | PRN
  Filled 2012-09-12: qty 6.7

## 2012-09-12 MED ORDER — DIAZEPAM 5 MG PO TABS
5.0000 mg | ORAL_TABLET | Freq: Four times a day (QID) | ORAL | Status: DC | PRN
  Administered 2012-09-13 – 2012-09-17 (×5): 5 mg via ORAL
  Filled 2012-09-12 (×5): qty 1

## 2012-09-12 MED ORDER — BUPIVACAINE LIPOSOME 1.3 % IJ SUSP
INTRAMUSCULAR | Status: DC | PRN
  Administered 2012-09-12: 20 mL

## 2012-09-12 MED ORDER — LEVOTHYROXINE SODIUM 75 MCG PO TABS
75.0000 ug | ORAL_TABLET | Freq: Every day | ORAL | Status: DC
  Administered 2012-09-13 – 2012-09-18 (×6): 75 ug via ORAL
  Filled 2012-09-12 (×7): qty 1

## 2012-09-12 MED ORDER — SODIUM CHLORIDE 0.9 % IJ SOLN: 9.0000 mL | INTRAMUSCULAR | Status: DC | PRN

## 2012-09-12 MED ORDER — NEOSTIGMINE METHYLSULFATE 1 MG/ML IJ SOLN
INTRAMUSCULAR | Status: DC | PRN
  Administered 2012-09-12: 4 mg via INTRAVENOUS

## 2012-09-12 MED ORDER — ACETAMINOPHEN 325 MG PO TABS
650.0000 mg | ORAL_TABLET | ORAL | Status: DC | PRN
  Administered 2012-09-13 – 2012-09-15 (×7): 650 mg via ORAL
  Filled 2012-09-12 (×7): qty 2

## 2012-09-12 MED ORDER — BACITRACIN 50000 UNITS IM SOLR
INTRAMUSCULAR | Status: AC
  Filled 2012-09-12: qty 1

## 2012-09-12 MED ORDER — HYDROCHLOROTHIAZIDE 25 MG PO TABS
25.0000 mg | ORAL_TABLET | Freq: Every day | ORAL | Status: DC
  Administered 2012-09-12 – 2012-09-18 (×4): 25 mg via ORAL
  Filled 2012-09-12 (×7): qty 1

## 2012-09-12 MED ORDER — ACETAMINOPHEN 10 MG/ML IV SOLN
INTRAVENOUS | Status: AC
  Filled 2012-09-12: qty 100

## 2012-09-12 MED ORDER — DOCUSATE SODIUM 100 MG PO CAPS
100.0000 mg | ORAL_CAPSULE | Freq: Two times a day (BID) | ORAL | Status: DC
  Administered 2012-09-12 – 2012-09-18 (×12): 100 mg via ORAL
  Filled 2012-09-12 (×9): qty 1

## 2012-09-12 MED ORDER — MENTHOL 3 MG MT LOZG: 1.0000 | LOZENGE | OROMUCOSAL | Status: DC | PRN

## 2012-09-12 MED ORDER — HYDROMORPHONE HCL PF 1 MG/ML IJ SOLN
0.2500 mg | INTRAMUSCULAR | Status: DC | PRN
  Administered 2012-09-12 (×2): 0.5 mg via INTRAVENOUS

## 2012-09-12 MED ORDER — ONDANSETRON HCL 4 MG/2ML IJ SOLN: 4.0000 mg | Freq: Four times a day (QID) | INTRAMUSCULAR | Status: DC | PRN

## 2012-09-12 MED ORDER — LACTATED RINGERS IV SOLN
INTRAVENOUS | Status: DC | PRN
  Administered 2012-09-12 (×3): via INTRAVENOUS

## 2012-09-12 MED ORDER — ARTIFICIAL TEARS OP OINT
TOPICAL_OINTMENT | OPHTHALMIC | Status: DC | PRN
  Administered 2012-09-12: 1 via OPHTHALMIC

## 2012-09-12 MED ORDER — TRAZODONE HCL 100 MG PO TABS
100.0000 mg | ORAL_TABLET | Freq: Every day | ORAL | Status: DC
  Administered 2012-09-12 – 2012-09-17 (×6): 100 mg via ORAL
  Filled 2012-09-12 (×7): qty 1

## 2012-09-12 MED ORDER — GLYCOPYRROLATE 0.2 MG/ML IJ SOLN
INTRAMUSCULAR | Status: DC | PRN
  Administered 2012-09-12: 0.6 mg via INTRAVENOUS

## 2012-09-12 MED ORDER — BUPIVACAINE LIPOSOME 1.3 % IJ SUSP
20.0000 mL | INTRAMUSCULAR | Status: AC
  Filled 2012-09-12: qty 20

## 2012-09-12 MED ORDER — SODIUM CHLORIDE 0.9 % IV SOLN
INTRAVENOUS | Status: AC
  Filled 2012-09-12: qty 500

## 2012-09-12 MED ORDER — ACETAMINOPHEN 650 MG RE SUPP: 650.0000 mg | RECTAL | Status: DC | PRN

## 2012-09-12 MED ORDER — ZOLPIDEM TARTRATE 5 MG PO TABS: 5.0000 mg | ORAL_TABLET | Freq: Every evening | ORAL | Status: DC | PRN

## 2012-09-12 MED ORDER — NALOXONE HCL 0.4 MG/ML IJ SOLN: 0.4000 mg | INTRAMUSCULAR | Status: DC | PRN

## 2012-09-12 MED ORDER — HYDROMORPHONE 0.3 MG/ML IV SOLN
INTRAVENOUS | Status: AC
  Administered 2012-09-12: 15:00:00
  Filled 2012-09-12: qty 25

## 2012-09-12 MED ORDER — ONDANSETRON HCL 4 MG/2ML IJ SOLN
INTRAMUSCULAR | Status: DC | PRN
  Administered 2012-09-12: 4 mg via INTRAVENOUS

## 2012-09-12 MED ORDER — BUPIVACAINE-EPINEPHRINE PF 0.5-1:200000 % IJ SOLN
INTRAMUSCULAR | Status: DC | PRN
  Administered 2012-09-12: 10 mL

## 2012-09-12 MED ORDER — SODIUM CHLORIDE 0.9 % IR SOLN
Status: DC | PRN
  Administered 2012-09-12: 10:00:00

## 2012-09-12 MED ORDER — THROMBIN 20000 UNITS EX SOLR
CUTANEOUS | Status: DC | PRN
  Administered 2012-09-12: 10:00:00 via TOPICAL

## 2012-09-12 MED ORDER — PREGABALIN 50 MG PO CAPS
100.0000 mg | ORAL_CAPSULE | Freq: Three times a day (TID) | ORAL | Status: DC
  Administered 2012-09-12 – 2012-09-18 (×18): 100 mg via ORAL
  Filled 2012-09-12 (×18): qty 2

## 2012-09-12 MED ORDER — ALBUMIN HUMAN 5 % IV SOLN
INTRAVENOUS | Status: DC | PRN
  Administered 2012-09-12: 13:00:00 via INTRAVENOUS

## 2012-09-12 MED ORDER — BACITRACIN ZINC 500 UNIT/GM EX OINT
TOPICAL_OINTMENT | CUTANEOUS | Status: DC | PRN
  Administered 2012-09-12: 1 via TOPICAL

## 2012-09-12 MED ORDER — ALUM & MAG HYDROXIDE-SIMETH 200-200-20 MG/5ML PO SUSP: 30.0000 mL | Freq: Four times a day (QID) | ORAL | Status: DC | PRN

## 2012-09-12 MED ORDER — PHENOL 1.4 % MT LIQD: 1.0000 | OROMUCOSAL | Status: DC | PRN

## 2012-09-12 MED ORDER — LIDOCAINE HCL (CARDIAC) 20 MG/ML IV SOLN
INTRAVENOUS | Status: DC | PRN
  Administered 2012-09-12: 50 mg via INTRAVENOUS

## 2012-09-12 MED ORDER — PANTOPRAZOLE SODIUM 40 MG PO TBEC
40.0000 mg | DELAYED_RELEASE_TABLET | Freq: Every day | ORAL | Status: DC
  Administered 2012-09-13 – 2012-09-18 (×6): 40 mg via ORAL
  Filled 2012-09-12: qty 2
  Filled 2012-09-12 (×4): qty 1

## 2012-09-12 MED ORDER — 0.9 % SODIUM CHLORIDE (POUR BTL) OPTIME
TOPICAL | Status: DC | PRN
  Administered 2012-09-12: 1000 mL

## 2012-09-12 MED ORDER — PHENTERMINE HCL 37.5 MG PO TABS: 37.5000 mg | ORAL_TABLET | Freq: Every day | ORAL | Status: DC

## 2012-09-12 MED ORDER — MIDAZOLAM HCL 5 MG/5ML IJ SOLN
INTRAMUSCULAR | Status: DC | PRN
  Administered 2012-09-12 (×2): 1 mg via INTRAVENOUS

## 2012-09-12 MED ORDER — FENTANYL CITRATE 0.05 MG/ML IJ SOLN
INTRAMUSCULAR | Status: DC | PRN
  Administered 2012-09-12: 50 ug via INTRAVENOUS
  Administered 2012-09-12: 100 ug via INTRAVENOUS
  Administered 2012-09-12 (×2): 50 ug via INTRAVENOUS
  Administered 2012-09-12: 100 ug via INTRAVENOUS

## 2012-09-12 MED ORDER — DEXTROSE 5 % IV SOLN
INTRAVENOUS | Status: DC | PRN
  Administered 2012-09-12: 10:00:00 via INTRAVENOUS

## 2012-09-12 MED ORDER — DIPHENHYDRAMINE HCL 50 MG/ML IJ SOLN: 12.5000 mg | Freq: Four times a day (QID) | INTRAMUSCULAR | Status: DC | PRN

## 2012-09-12 MED ORDER — ROCURONIUM BROMIDE 100 MG/10ML IV SOLN
INTRAVENOUS | Status: DC | PRN
  Administered 2012-09-12: 50 mg via INTRAVENOUS

## 2012-09-12 MED ORDER — HYDROMORPHONE HCL PF 1 MG/ML IJ SOLN
INTRAMUSCULAR | Status: AC
  Filled 2012-09-12: qty 1

## 2012-09-12 MED ORDER — HYDROMORPHONE 0.3 MG/ML IV SOLN
INTRAVENOUS | Status: DC
  Administered 2012-09-12: 1.2 mg via INTRAVENOUS
  Administered 2012-09-12: 2.1 mg via INTRAVENOUS
  Administered 2012-09-12: 0.6 mg via INTRAVENOUS
  Administered 2012-09-13 (×2): 1.5 mg via INTRAVENOUS

## 2012-09-12 MED ORDER — LACTATED RINGERS IV SOLN
INTRAVENOUS | Status: DC
  Administered 2012-09-12: 18:00:00 via INTRAVENOUS

## 2012-09-12 MED ORDER — CEFAZOLIN SODIUM-DEXTROSE 2-3 GM-% IV SOLR
2.0000 g | Freq: Three times a day (TID) | INTRAVENOUS | Status: AC
  Administered 2012-09-12 – 2012-09-13 (×2): 2 g via INTRAVENOUS
  Filled 2012-09-12 (×4): qty 50

## 2012-09-12 MED ORDER — IRBESARTAN 150 MG PO TABS
150.0000 mg | ORAL_TABLET | Freq: Every day | ORAL | Status: DC
  Administered 2012-09-12 – 2012-09-18 (×5): 150 mg via ORAL
  Filled 2012-09-12 (×7): qty 1

## 2012-09-12 MED ORDER — PROPOFOL 10 MG/ML IV BOLUS
INTRAVENOUS | Status: DC | PRN
  Administered 2012-09-12: 160 mg via INTRAVENOUS

## 2012-09-12 MED ORDER — HYDROMORPHONE HCL 2 MG PO TABS
4.0000 mg | ORAL_TABLET | ORAL | Status: DC | PRN
  Administered 2012-09-13 – 2012-09-17 (×18): 4 mg via ORAL
  Administered 2012-09-18: 2 mg via ORAL
  Administered 2012-09-18: 4 mg via ORAL
  Filled 2012-09-12 (×20): qty 2

## 2012-09-12 MED ORDER — ONDANSETRON HCL 4 MG/2ML IJ SOLN
4.0000 mg | INTRAMUSCULAR | Status: DC | PRN
  Administered 2012-09-14: 4 mg via INTRAVENOUS
  Filled 2012-09-12 (×2): qty 2

## 2012-09-12 SURGICAL SUPPLY — 71 items
APL SKNCLS STERI-STRIP NONHPOA (GAUZE/BANDAGES/DRESSINGS) ×1
BAG DECANTER FOR FLEXI CONT (MISCELLANEOUS) ×2 IMPLANT
BENZOIN TINCTURE PRP APPL 2/3 (GAUZE/BANDAGES/DRESSINGS) ×2 IMPLANT
BLADE SURG ROTATE 9660 (MISCELLANEOUS) IMPLANT
BRUSH SCRUB EZ PLAIN DRY (MISCELLANEOUS) ×2 IMPLANT
BUR ACORN 6.0 (BURR) ×2 IMPLANT
BUR MATCHSTICK NEURO 3.0 LAGG (BURR) ×2 IMPLANT
CANISTER SUCTION 2500CC (MISCELLANEOUS) ×3 IMPLANT
CAP REVERE LOCKING (Cap) ×4 IMPLANT
CLOTH BEACON ORANGE TIMEOUT ST (SAFETY) ×2 IMPLANT
CONT SPEC 4OZ CLIKSEAL STRL BL (MISCELLANEOUS) ×2 IMPLANT
COVER BACK TABLE 24X17X13 BIG (DRAPES) IMPLANT
COVER TABLE BACK 60X90 (DRAPES) ×2 IMPLANT
DRAPE C-ARM 42X72 X-RAY (DRAPES) ×4 IMPLANT
DRAPE LAPAROTOMY 100X72X124 (DRAPES) ×2 IMPLANT
DRAPE POUCH INSTRU U-SHP 10X18 (DRAPES) ×2 IMPLANT
DRAPE PROXIMA HALF (DRAPES) ×4 IMPLANT
DRAPE SURG 17X23 STRL (DRAPES) ×8 IMPLANT
ELECT BLADE 4.0 EZ CLEAN MEGAD (MISCELLANEOUS) ×2
ELECT REM PT RETURN 9FT ADLT (ELECTROSURGICAL) ×2
ELECTRODE BLDE 4.0 EZ CLN MEGD (MISCELLANEOUS) ×1 IMPLANT
ELECTRODE REM PT RTRN 9FT ADLT (ELECTROSURGICAL) ×1 IMPLANT
EVACUATOR 1/8 PVC DRAIN (DRAIN) ×1 IMPLANT
GAUZE SPONGE 4X4 16PLY XRAY LF (GAUZE/BANDAGES/DRESSINGS) ×2 IMPLANT
GLOVE BIO SURGEON STRL SZ8.5 (GLOVE) ×5 IMPLANT
GLOVE BIOGEL PI IND STRL 7.0 (GLOVE) ×2 IMPLANT
GLOVE BIOGEL PI IND STRL 8 (GLOVE) IMPLANT
GLOVE BIOGEL PI INDICATOR 7.0 (GLOVE) ×2
GLOVE BIOGEL PI INDICATOR 8 (GLOVE) ×1
GLOVE ECLIPSE 7.5 STRL STRAW (GLOVE) ×1 IMPLANT
GLOVE EXAM NITRILE LRG STRL (GLOVE) IMPLANT
GLOVE EXAM NITRILE MD LF STRL (GLOVE) IMPLANT
GLOVE EXAM NITRILE XL STR (GLOVE) IMPLANT
GLOVE EXAM NITRILE XS STR PU (GLOVE) IMPLANT
GLOVE INDICATOR 8.5 STRL (GLOVE) ×2 IMPLANT
GLOVE SS BIOGEL STRL SZ 8 (GLOVE) ×2 IMPLANT
GLOVE SS N UNI LF 7.0 STRL (GLOVE) ×3 IMPLANT
GLOVE SUPERSENSE BIOGEL SZ 8 (GLOVE) ×2
GOWN BRE IMP SLV AUR LG STRL (GOWN DISPOSABLE) IMPLANT
GOWN BRE IMP SLV AUR XL STRL (GOWN DISPOSABLE) ×5 IMPLANT
GOWN STRL REIN 2XL LVL4 (GOWN DISPOSABLE) IMPLANT
KIT BASIN OR (CUSTOM PROCEDURE TRAY) ×2 IMPLANT
KIT ROOM TURNOVER OR (KITS) ×2 IMPLANT
NDL HYPO 21X1.5 SAFETY (NEEDLE) IMPLANT
NEEDLE HYPO 21X1.5 SAFETY (NEEDLE) ×2 IMPLANT
NEEDLE HYPO 22GX1.5 SAFETY (NEEDLE) ×2 IMPLANT
NS IRRIG 1000ML POUR BTL (IV SOLUTION) ×2 IMPLANT
PACK FOAM VITOSS 10CC (Orthopedic Implant) ×1 IMPLANT
PACK LAMINECTOMY NEURO (CUSTOM PROCEDURE TRAY) ×2 IMPLANT
PAD ARMBOARD 7.5X6 YLW CONV (MISCELLANEOUS) ×6 IMPLANT
PATTIES SURGICAL .5 X1 (DISPOSABLE) IMPLANT
PUTTY 10ML ACTIFUSE ABX (Putty) ×2 IMPLANT
PUTTY 5ML ACTIFUSE ABX (Putty) ×1 IMPLANT
ROD CURVED REVERE 6.35X50MM (Rod) ×2 IMPLANT
SCREW REVERE 6.5X50MM (Screw) ×4 IMPLANT
SLEEVE SURGEON STRL (DRAPES) ×1 IMPLANT
SPACER SUSTAIN O 10X26 15MM (Spacer) ×4 IMPLANT
SPONGE GAUZE 4X4 12PLY (GAUZE/BANDAGES/DRESSINGS) ×2 IMPLANT
SPONGE LAP 4X18 X RAY DECT (DISPOSABLE) ×2 IMPLANT
SPONGE NEURO XRAY DETECT 1X3 (DISPOSABLE) IMPLANT
SPONGE SURGIFOAM ABS GEL 100 (HEMOSTASIS) ×2 IMPLANT
STRIP CLOSURE SKIN 1/2X4 (GAUZE/BANDAGES/DRESSINGS) ×4 IMPLANT
SUT VIC AB 1 CT1 18XBRD ANBCTR (SUTURE) ×2 IMPLANT
SUT VIC AB 1 CT1 8-18 (SUTURE) ×6
SUT VIC AB 2-0 CP2 18 (SUTURE) ×4 IMPLANT
SYR 20CC LL (SYRINGE) ×1 IMPLANT
SYR 20ML ECCENTRIC (SYRINGE) ×2 IMPLANT
TOWEL OR 17X24 6PK STRL BLUE (TOWEL DISPOSABLE) ×2 IMPLANT
TOWEL OR 17X26 10 PK STRL BLUE (TOWEL DISPOSABLE) ×2 IMPLANT
TRAY FOLEY CATH 14FRSI W/METER (CATHETERS) ×2 IMPLANT
WATER STERILE IRR 1000ML POUR (IV SOLUTION) ×2 IMPLANT

## 2012-09-12 NOTE — Op Note (Signed)
Brief history: The patient is a 62 year old white female who is complaining of back and hip and leg pain consistent with neurogenic claudication. She has failed medical management and was worked up with a lumbar MRI and lumbar myelo CT. This demonstrated spinal stenosis facet arthropathy spondylolisthesis etc. at L4-5. I discussed the various treatment options with the patient including surgery. She has weighed the risks, benefits, and alternatives surgery and decided proceed with an L4-5 decompression, instrumentation, and fusion.  Preoperative diagnosis: L4-5 spondylolisthesis, Degenerative disc disease, spinal stenosis compressing both the L4 and the L5 nerve roots; lumbago; lumbar radiculopathy: Neurogenic claudication  Postoperative diagnosis: The same  Procedure: Bilateral L4 Laminotomy/foraminotomies to decompress the bilateral L4 and L5 nerve roots(the work required to do this was in addition to the work required to do the posterior lumbar interbody fusion because of the patient's spinal stenosis, facet arthropathy. Etc. requiring a wide decompression of the nerve roots.); L4-5 posterior lumbar interbody fusion with local morselized autograft bone and Actifusebone graft extender; insertion of interbody prosthesis at L4-5 (globus peek interbody prosthesis); posterior nonsegmental instrumentation from L4 to L5 with globus titanium pedicle screws and rods; posterior lateral arthrodesis at L4-5 with local morselized autograft bone and Vitoss bone graft extender.  Surgeon: Dr. Delma Officer  Asst.: Shirlean Kelly  Anesthesia: Gen. endotracheal  Estimated blood loss: 200 cc  Drains: One medium Hemovac  Complications: None  Description of procedure: The patient was brought to the operating room by the anesthesia team. General endotracheal anesthesia was induced. The patient was turned to the prone position on the Wilson frame. The patient's lumbosacral region was then prepared with Betadine  scrub and Betadine solution. Sterile drapes were applied.  I then injected the area to be incised with Marcaine with epinephrine solution. I then used the scalpel to make a linear midline incision over the L4-5 interspace. I then used electrocautery to perform a bilateral subperiosteal dissection exposing the spinous process and lamina of L4 and L5. We then obtained intraoperative radiograph to confirm our location. We then inserted the Verstrac retractor to provide exposure.  I began the decompression by using the high speed drill to perform laminotomies at L4. We then used the Kerrison punches to widen the laminotomy and removed the ligamentum flavum at L4-5. We used the Kerrison punches to remove the medial facets at L4-5. We performed wide foraminotomies about the bilateral L4 and L5 nerve roots completing the decompression.  We now turned our attention to the posterior lumbar interbody fusion. I used a scalpel to incise the intervertebral disc at L4-5. I then performed a partial intervertebral discectomy at L4-5 using the pituitary forceps. We prepared the vertebral endplates at L4-5 for the fusion by removing the soft tissues with the curettes. We then used the trial spacers to pick the appropriate sized interbody prosthesis. We prefilled his prosthesis with a combination of local morselized autograft bone that we obtained during the decompression as well as Actifuse bone graft extender. We inserted the prefilled prosthesis into the interspace at L4-5. There was a good snug fit of the prosthesis in the interspace. We then filled and the remainder of the intervertebral disc space with local morselized autograft bone and Actifuse. This completed the posterior lumbar interbody arthrodesis.  We now turned attention to the instrumentation. Under fluoroscopic guidance we cannulated the bilateral L4 and L5 pedicles with the bone probe. We then removed the bone probe. We then tapped the pedicle with a 5.5  millimeter tap. We then removed the  tap. We probed inside the tapped pedicle with a ball probe to rule out cortical breaches. We then inserted a 6.5 x 50 millimeter pedicle screw into the L4 and L5 pedicles bilaterally under fluoroscopic guidance. We then palpated along the medial aspect of the pedicles to rule out cortical breaches. There were none. The nerve roots were not injured. We then connected the unilateral pedicle screws with a lordotic rod. We compressed the construct and secured the rod in place with the caps. We then tightened the caps appropriately. This completed the instrumentation from L4-5.  We now turned our attention to the posterior lateral arthrodesis at L4-5. We used the high-speed drill to decorticate the remainder of the facets, pars, transverse process at L4-5. We then applied a combination of local morselized autograft bone and Vitoss bone graft extender over these decorticated posterior lateral structures. This completed the posterior lateral arthrodesis.  We then obtained hemostasis using bipolar electrocautery. We irrigated the wound out with bacitracin solution. We inspected the thecal sac and nerve roots and noted they were well decompressed. We then removed the retractor. We placed a medium Hemovac drain in the epidural space and tunneled out through separate stab wound. We reapproximated patient's thoracolumbar fascia with interrupted #1 Vicryl suture. We reapproximated patient's subcutaneous tissue with interrupted 2-0 Vicryl suture. The reapproximated patient's skin with Steri-Strips and benzoin. The wound was then coated with bacitracin ointment. A sterile dressing was applied. The drapes were removed. The patient was subsequently returned to the supine position where they were extubated by the anesthesia team. He was then transported to the post anesthesia care unit in stable condition. All sponge instrument and needle counts were reportedly correct at the end of this  case.

## 2012-09-12 NOTE — Anesthesia Procedure Notes (Signed)
Procedure Name: Intubation Date/Time: 09/12/2012 10:10 AM Performed by: Darcey Nora B Pre-anesthesia Checklist: Patient identified, Emergency Drugs available, Suction available and Patient being monitored Patient Re-evaluated:Patient Re-evaluated prior to inductionOxygen Delivery Method: Circle system utilized Preoxygenation: Pre-oxygenation with 100% oxygen Intubation Type: IV induction Ventilation: Mask ventilation without difficulty Laryngoscope Size: Mac and 3 Grade View: Grade II Tube type: Oral Tube size: 7.5 mm Number of attempts: 1 Airway Equipment and Method: Stylet Placement Confirmation: ETT inserted through vocal cords under direct vision,  breath sounds checked- equal and bilateral and positive ETCO2 Secured at: 21 (cm at gum) cm Tube secured with: Tape Dental Injury: Teeth and Oropharynx as per pre-operative assessment

## 2012-09-12 NOTE — Progress Notes (Signed)
Patient ID: Yesenia [name], female   DOB: 1950/10/12, 62 y.o.   MRN: [id number] Subjective:  The patient is somnolent but easily arousable. She looks well. She has no complaints.  Objective: Vital signs in last 24 hours: Temp:  [96.8 F (36 C)-97.1 F (36.2 C)] 96.8 F (36 C) (06/09 1411) Pulse Rate:  [64] 64 (06/09 0624) Resp:  [20] 20 (06/09 0624) BP: (176)/(73) 176/73 mmHg (06/09 0624) SpO2:  [96 %] 96 % (06/09 0624)  Intake/Output from previous day:   Intake/Output this shift: Total I/O In: 2800 [I.V.:2550; IV Piggyback:250] Out: 840 [Urine:565; Blood:275]  Physical exam patient is Glasgow Coma Scale 13. She is moving all 4 extremities well.  Lab Results: No results found for this basename: WBC, HGB, HCT, PLT,  in the last 72 hours BMET No results found for this basename: NA, K, CL, CO2, GLUCOSE, BUN, CREATININE, CALCIUM,  in the last 72 hours  Studies/Results: Dg Lumbar Spine 2-3 Views  09/12/2012   *RADIOLOGY REPORT*  Clinical Data: Back pain  DG C-ARM 1-60 MIN,LUMBAR SPINE - 2-3 VIEW  Technique:  C-arm fluoroscopic images were obtained intraoperatively and submitted for postoperative interpretation. Please see the performing provider's procedural report for the fluoroscopy time utilized.  Comparison: Multiple priors.  Findings: C-arm films document L4-5 PLIF.  IMPRESSION: Satisfactory position and alignment.   Original Report Authenticated By: Davonna Belling, M.D.   Dg Lumbar Spine 2-3 Views  09/12/2012   *RADIOLOGY REPORT*  Clinical Data: PLIF of L4-5.  LUMBAR SPINE - 2-3 VIEW  Comparison: 08/01/2012  Findings: Exam detail is diminished secondary to patient's body habitus. The image labeled #1 is a lateral portable radiograph of the lumbar spine.  Surgical probe is posterior to the L3-4 disc space.  IMPRESSION:  1.  Surgical probe is posterior to the L3-4 disc space.   Original Report Authenticated By: Signa Kell, M.D.   Dg C-arm 1-60 Min  09/12/2012   *RADIOLOGY REPORT*   Clinical Data: Back pain  DG C-ARM 1-60 MIN,LUMBAR SPINE - 2-3 VIEW  Technique:  C-arm fluoroscopic images were obtained intraoperatively and submitted for postoperative interpretation. Please see the performing provider's procedural report for the fluoroscopy time utilized.  Comparison: Multiple priors.  Findings: C-arm films document L4-5 PLIF.  IMPRESSION: Satisfactory position and alignment.   Original Report Authenticated By: Davonna Belling, M.D.    Assessment/Plan: The patient is doing well.  LOS: 0 days     Stefani Baik D 09/12/2012, 2:23 PM
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
## 2012-09-12 NOTE — Progress Notes (Signed)
pcxr done

## 2012-09-12 NOTE — Transfer of Care (Signed)
Immediate Anesthesia Transfer of Care Note  Patient: Yesenia [name]  Procedure(s) Performed: Procedure(s) with comments: POSTERIOR LUMBAR FUSION 1 LEVEL C-Arm (N/A) - Lumbar four-five posterior lumbar interbody fusion with interbody prothesis posterolateral arthrodesis and posterior nonsegmental instrumentation  Patient Location: PACU  Anesthesia Type:General  Level of Consciousness: awake, oriented and patient cooperative  Airway & Oxygen Therapy: Patient Spontanous Breathing and Patient connected to face mask oxygen  Post-op Assessment: Report given to PACU RN, Post -op Vital signs reviewed and stable and Patient moving all extremities  Post vital signs: Reviewed and stable  Complications: No apparent anesthesia complications

## 2012-09-12 NOTE — Progress Notes (Signed)
UR COMPLETED  

## 2012-09-12 NOTE — Anesthesia Preprocedure Evaluation (Addendum)
Anesthesia Evaluation  Patient identified by MRN, date of birth, ID band Patient awake    Reviewed: Allergy & Precautions, H&P , NPO status , Patient's Chart, lab work & pertinent test results, reviewed documented beta blocker date and time   Airway Mallampati: II TM Distance: >3 FB Neck ROM: Full    Dental no notable dental hx. (+) Edentulous Upper, Edentulous Lower and Dental Advisory Given   Pulmonary asthma , sleep apnea , pneumonia -, resolved,  Occasional Proventil for chronic cough; none in several months.  Inconclusive sleep study in past accdg to pt. breath sounds clear to auscultation  Pulmonary exam normal       Cardiovascular hypertension, On Medications and Pt. on medications Rhythm:Regular Rate:Normal     Neuro/Psych fibromyalgia negative neurological ROS  negative psych ROS   GI/Hepatic Neg liver ROS, hiatal hernia, GERD-  Medicated and Controlled,  Endo/Other  diabetesHypothyroidism Morbid obesityWas dx'd with DM a while back and then lost 40 lbs of wt. Has been d'c'd from Metformin  Renal/GU negative Renal ROS  negative genitourinary   Musculoskeletal   Abdominal   Peds  Hematology negative hematology ROS (+)   Anesthesia Other Findings Contact precautions maintained for + nasal smear  Reproductive/Obstetrics negative OB ROS                       Anesthesia Physical Anesthesia Plan  ASA: III  Anesthesia Plan: General   Post-op Pain Management:    Induction: Intravenous  Airway Management Planned: Oral ETT  Additional Equipment: CVP  Intra-op Plan:   Post-operative Plan: Extubation in OR  Informed Consent: I have reviewed the patients History and Physical, chart, labs and discussed the procedure including the risks, benefits and alternatives for the proposed anesthesia with the patient or authorized representative who has indicated his/her understanding and acceptance.    Dental advisory given  Plan Discussed with: CRNA  Anesthesia Plan Comments:        Anesthesia Quick Evaluation

## 2012-09-12 NOTE — Anesthesia Postprocedure Evaluation (Signed)
Anesthesia Post Note  Patient: Yesenia [name]  Procedure(s) Performed: Procedure(s) (LRB): POSTERIOR LUMBAR FUSION 1 LEVEL C-Arm (N/A)  Anesthesia type: general  Patient location: PACU  Post pain: Pain level controlled  Post assessment: Patient's Cardiovascular Status Stable  Last Vitals:  Filed Vitals:   09/12/12 1556  BP: 128/65  Pulse: 61  Temp: 36.5 C  Resp: 9    Post vital signs: Reviewed and stable  Level of consciousness: sedated  Complications: No apparent anesthesia complications

## 2012-09-12 NOTE — Progress Notes (Signed)
Patient ID: [name], female   DOB: [birth year], 62 y.o.   MRN: [id number] Subjective:  The patient is alert and pleasant. She looks well. Her back is appropriately sore.  Objective: Vital signs in last 24 hours: Temp:  [96.8 F (36 C)-97.7 F (36.5 C)] 97.7 F (36.5 C) (06/09 1556) Pulse Rate:  [58-64] 61 (06/09 1556) Resp:  [9-29] 9 (06/09 1644) BP: (94-176)/(49-73) 128/65 mmHg (06/09 1556) SpO2:  [91 %-98 %] 91 % (06/09 1644) Weight:  [117.7 kg (259 lb 7.7 oz)] 117.7 kg (259 lb 7.7 oz) (06/09 1556)  Intake/Output from previous day:   Intake/Output this shift: Total I/O In: 2800 [I.V.:2550; IV Piggyback:250] Out: 840 [Urine:565; Blood:275]  Physical exam patient is alert and oriented. She is moving her lower extremities well.  Lab Results: No results found for this basename: WBC, HGB, HCT, PLT,  in the last 72 hours BMET No results found for this basename: NA, K, CL, CO2, GLUCOSE, BUN, CREATININE, CALCIUM,  in the last 72 hours  Studies/Results: Dg Lumbar Spine 2-3 Views  09/12/2012   *RADIOLOGY REPORT*  Clinical Data: Back pain  DG C-ARM 1-60 MIN,LUMBAR SPINE - 2-3 VIEW  Technique:  C-arm fluoroscopic images were obtained intraoperatively and submitted for postoperative interpretation. Please see the performing provider's procedural report for the fluoroscopy time utilized.  Comparison: Multiple priors.  Findings: C-arm films document L4-5 PLIF.  IMPRESSION: Satisfactory position and alignment.   Original Report Authenticated By: Davonna Belling, M.D.   Dg Lumbar Spine 2-3 Views  09/12/2012   *RADIOLOGY REPORT*  Clinical Data: PLIF of L4-5.  LUMBAR SPINE - 2-3 VIEW  Comparison: 08/01/2012  Findings: Exam detail is diminished secondary to patient's body habitus. The image labeled #1 is a lateral portable radiograph of the lumbar spine.  Surgical probe is posterior to the L3-4 disc space.  IMPRESSION:  1.  Surgical probe is posterior to the L3-4 disc space.   Original Report  Authenticated By: Signa Kell, M.D.   Dg Chest Port 1 View  09/12/2012   *RADIOLOGY REPORT*  Clinical Data: Evaluate central line placement  PORTABLE CHEST - 1 VIEW  Comparison: 01/12/2012  Findings: Left IJ catheter is noted with tip in the expected location of the left innominate vein. No pneumothorax identified. Heart size is moderately enlarged.  There is pulmonary vascular congestion.  IMPRESSION:  1.  Left IJ catheter tip is in the expected location of the left innominate vein. 2.  Cardiac enlargement and pulmonary vascular congestion.   Original Report Authenticated By: Signa Kell, M.D.   Dg C-arm 1-60 Min  09/12/2012   *RADIOLOGY REPORT*  Clinical Data: Back pain  DG C-ARM 1-60 MIN,LUMBAR SPINE - 2-3 VIEW  Technique:  C-arm fluoroscopic images were obtained intraoperatively and submitted for postoperative interpretation. Please see the performing provider's procedural report for the fluoroscopy time utilized.  Comparison: Multiple priors.  Findings: C-arm films document L4-5 PLIF.  IMPRESSION: Satisfactory position and alignment.   Original Report Authenticated By: Davonna Belling, M.D.    Assessment/Plan: The patient is doing well.  LOS: 0 days     Corrie Brannen D 09/12/2012, 5:36 PM

## 2012-09-12 NOTE — Preoperative (Signed)
Beta Blockers   Reason not to administer Beta Blockers:Not Applicable 

## 2012-09-13 ENCOUNTER — Inpatient Hospital Stay (HOSPITAL_COMMUNITY): Payer: PRIVATE HEALTH INSURANCE

## 2012-09-13 LAB — BASIC METABOLIC PANEL
BUN: 6 mg/dL (ref 6–23)
GFR calc Af Amer: 69 mL/min — ABNORMAL LOW (ref >90)
GFR calc non Af Amer: 60 mL/min — ABNORMAL LOW (ref >90)
Potassium: 3.7 mEq/L (ref 3.5–5.1)

## 2012-09-13 LAB — CBC
HCT: 32.1 % — ABNORMAL LOW (ref 36.0–46.0)
MCHC: 31.2 g/dL (ref 30.0–36.0)
Platelets: 204 10*3/uL (ref 150–400)
RDW: 15.4 % (ref 11.5–15.5)
WBC: 7.7 10*3/uL (ref 4.0–10.5)

## 2012-09-13 LAB — URINALYSIS, ROUTINE W REFLEX MICROSCOPIC
Bilirubin Urine: NEGATIVE
Ketones, ur: NEGATIVE mg/dL
Leukocytes, UA: NEGATIVE
Nitrite: NEGATIVE
Specific Gravity, Urine: 1.011 (ref 1.005–1.030)
Urobilinogen, UA: 0.2 mg/dL (ref 0.0–1.0)

## 2012-09-13 MED ORDER — SODIUM CHLORIDE 0.9 % IJ SOLN: 10.0000 mL | Freq: Two times a day (BID) | INTRAMUSCULAR | Status: DC

## 2012-09-13 MED ORDER — HYDROMORPHONE HCL PF 1 MG/ML IJ SOLN
1.0000 mg | INTRAMUSCULAR | Status: DC | PRN
  Administered 2012-09-13: 1 mg via INTRAVENOUS
  Filled 2012-09-13: qty 1

## 2012-09-13 MED ORDER — SODIUM CHLORIDE 0.9 % IJ SOLN
10.0000 mL | INTRAMUSCULAR | Status: DC | PRN
  Administered 2012-09-13 – 2012-09-14 (×4): 10 mL

## 2012-09-13 MED FILL — Sodium Chloride IV Soln 0.9%: INTRAVENOUS | Qty: 1000 | Status: AC

## 2012-09-13 MED FILL — Heparin Sodium (Porcine) Inj 1000 Unit/ML: INTRAMUSCULAR | Qty: 30 | Status: AC

## 2012-09-13 NOTE — Progress Notes (Signed)
OT Cancellation Note  Patient Details Name: JENNELLE PINKSTAFF MRN: [id number] DOB: 12/21/1950   Cancelled Treatment:    Reason Eval/Treat Not Completed: Pain limiting ability to participate. Pain 9/10. Will check back on Pt later if time allows.  Sherryl Manges 09/13/2012, 10:09 AM

## 2012-09-13 NOTE — Progress Notes (Signed)
Was rounding on patient,  Asked patient how she was doing, said fine just getting herself back in bed, after going to bathroom.  Reminded that she was supposed to call for help, when getting up, said ok.  Bed alarm on.  Verner Kopischke Charity fundraiser. SCRN

## 2012-09-13 NOTE — Progress Notes (Signed)
Patient ID: Yesenia [name], female   DOB: Aug 11, 1950, 62 y.o.   MRN: [id number] Subjective:  The patient is alert and pleasant. She looks well.  Objective: Vital signs in last 24 hours: Temp:  [96.8 F (36 C)-103 F (39.4 C)] 103 F (39.4 C) (06/10 0610) Pulse Rate:  [58-88] 81 (06/10 0610) Resp:  [9-29] 10 (06/10 0752) BP: (94-128)/(40-65) 117/45 mmHg (06/10 0610) SpO2:  [91 %-98 %] 94 % (06/10 0752) Weight:  [117.7 kg (259 lb 7.7 oz)] 117.7 kg (259 lb 7.7 oz) (06/09 1556)  Intake/Output from previous day: 06/09 0701 - 06/10 0700 In: 2800 [I.V.:2550; IV Piggyback:250] Out: 2445 [Urine:2165; Drains:5; Blood:275] Intake/Output this shift:    Physical exam the patient is alert and oriented. She is moving her lower extremities well.  Lab Results:  Recent Labs  09/13/12 0605  WBC 7.7  HGB 10.0*  HCT 32.1*  PLT 204   BMET  Recent Labs  09/13/12 0605  NA 138  K 3.7  CL 101  CO2 31  GLUCOSE 117*  BUN 6  CREATININE 1.00  CALCIUM 9.0    Studies/Results: Dg Lumbar Spine 2-3 Views  09/12/2012   *RADIOLOGY REPORT*  Clinical Data: Back pain  DG C-ARM 1-60 MIN,LUMBAR SPINE - 2-3 VIEW  Technique:  C-arm fluoroscopic images were obtained intraoperatively and submitted for postoperative interpretation. Please see the performing provider's procedural report for the fluoroscopy time utilized.  Comparison: Multiple priors.  Findings: C-arm films document L4-5 PLIF.  IMPRESSION: Satisfactory position and alignment.   Original Report Authenticated By: Davonna Belling, M.D.   Dg Lumbar Spine 2-3 Views  09/12/2012   *RADIOLOGY REPORT*  Clinical Data: PLIF of L4-5.  LUMBAR SPINE - 2-3 VIEW  Comparison: 08/01/2012  Findings: Exam detail is diminished secondary to patient's body habitus. The image labeled #1 is a lateral portable radiograph of the lumbar spine.  Surgical probe is posterior to the L3-4 disc space.  IMPRESSION:  1.  Surgical probe is posterior to the L3-4 disc space.    Original Report Authenticated By: Signa Kell, M.D.   Dg Chest Port 1 View  09/12/2012   *RADIOLOGY REPORT*  Clinical Data: Evaluate central line placement  PORTABLE CHEST - 1 VIEW  Comparison: 01/12/2012  Findings: Left IJ catheter is noted with tip in the expected location of the left innominate vein. No pneumothorax identified. Heart size is moderately enlarged.  There is pulmonary vascular congestion.  IMPRESSION:  1.  Left IJ catheter tip is in the expected location of the left innominate vein. 2.  Cardiac enlargement and pulmonary vascular congestion.   Original Report Authenticated By: Signa Kell, M.D.   Dg C-arm 1-60 Min  09/12/2012   *RADIOLOGY REPORT*  Clinical Data: Back pain  DG C-ARM 1-60 MIN,LUMBAR SPINE - 2-3 VIEW  Technique:  C-arm fluoroscopic images were obtained intraoperatively and submitted for postoperative interpretation. Please see the performing provider's procedural report for the fluoroscopy time utilized.  Comparison: Multiple priors.  Findings: C-arm films document L4-5 PLIF.  IMPRESSION: Satisfactory position and alignment.   Original Report Authenticated By: Davonna Belling, M.D.    Assessment/Plan: Postop day 1: We will mobilize the patient with PT and OT. I will discontinue the PCA pump.  Fever: We will culture appropriately.  LOS: 1 day     Jaeden Messer D 09/13/2012, 8:12 AM

## 2012-09-13 NOTE — Progress Notes (Signed)
Pt. Temp elevated.  Given po tylenol, encouraged to [place] IS,  Pt states she has been,  Given 500cc of LR bolus.  Dr. Othelia Pulling notified of  pts temp, and blood pressure.   Zyara Riling Charity fundraiser. SCRN

## 2012-09-13 NOTE — Progress Notes (Signed)
I agree with the following treatment note after reviewing documentation.   Johnston, Donnah Levert Brynn   OTR/L Pager: [phone] Office: [phone] .   

## 2012-09-13 NOTE — Progress Notes (Signed)
09/13/12: FYI the left IJ is not in a central vein ("Left IJ catheter is noted with tip in the expected location of the left innominate vein"). Please note this patient can not safely receive any medication that need to be given via a central line (Vanc, TNA, etc.) Lilton Pare, Valentina Shaggy (IV Team nurse).

## 2012-09-13 NOTE — Evaluation (Signed)
Physical Therapy Evaluation Patient Details Name: Yesenia [name] MRN: [id number] DOB: 12/05/1950 Today's Date: 09/13/2012 Time: 4098-1191 PT Time Calculation (min): 40 min  PT Assessment / Plan / Recommendation Clinical Impression  62 y/o female s/p L4-5 lumbar fusion POD #1. Presents to PT with below problem list impacting patients functional independence. Will benefit physical therapy in the acute setting to maximize independence and safety for return back to independent living facility with limited support. Will need to reach modified independent level prior to return home.     PT Assessment  Patient needs continued PT services    Follow Up Recommendations  Home health PT;Supervision - Intermittent    Does the patient have the potential to tolerate intense rehabilitation      Barriers to Discharge Decreased caregiver support      Equipment Recommendations  None recommended by PT    Recommendations for Other Services     Frequency 7X/week    Precautions / Restrictions Precautions Precautions: Fall;Back Precaution Booklet Issued: No Precaution Comments: reviewed 3/3 back precautions (pt remembers it by calling herself "BAT girl") Restrictions Weight Bearing Restrictions: No   Pertinent Vitals/Pain Reports 8/10 back pain throughout, RN provided pain meds       Mobility  Bed Mobility Bed Mobility: Rolling Left;Left Sidelying to Sit;Sit to Sidelying Right Rolling Left: 4: Min guard;With rail Left Sidelying to Sit: 4: Min guard;With rails Sit to Sidelying Right: 4: Min assist;HOB flat Details for Bed Mobility Assistance: verbal step by step sequencing cues for correct log roll technique, needed min-modA to maintain back precautions to return to bed Transfers Transfers: Sit to Stand;Stand to Sit Sit to Stand: 4: Min assist;With upper extremity assist;From bed Stand to Sit: 4: Min assist;With upper extremity assist;To bed Details for Transfer Assistance: cues for  safe hand placement, assist to rise and prevent excessive bending as well as control descent to bed Ambulation/Gait Ambulation/Gait Assistance: 4: Min guard Ambulation Distance (Feet): 25 Feet Assistive device: Rolling walker Ambulation/Gait Assistance Details: cues for tall posture with relaxed shoulders and forward gaze, verbal cues for back precautions specifically with turning Gait Pattern: Step-through pattern;Decreased stride length General Gait Details: slow and cautious, decreased trunk rotation Stairs: No Wheelchair Mobility Wheelchair Mobility: No         PT Diagnosis: Difficulty walking;Generalized weakness;Acute pain  PT Problem List: Decreased strength;Decreased activity tolerance;Decreased mobility;Decreased knowledge of precautions;Decreased knowledge of use of DME;Pain PT Treatment Interventions: DME instruction;Gait training;Functional mobility training;Therapeutic activities;Therapeutic exercise;Patient/family education   PT Goals Acute Rehab PT Goals PT Goal Formulation: With patient Time For Goal Achievement: 09/20/12 Potential to Achieve Goals: Good Pt will Roll Supine to Left Side: with modified independence PT Goal: Rolling Supine to Left Side - Progress: Goal set today Pt will go Supine/Side to Sit: with modified independence PT Goal: Supine/Side to Sit - Progress: Goal set today Pt will go Sit to Supine/Side: with modified independence PT Goal: Sit to Supine/Side - Progress: Goal set today Pt will go Sit to Stand: with modified independence PT Goal: Sit to Stand - Progress: Goal set today Pt will go Stand to Sit: with modified independence PT Goal: Stand to Sit - Progress: Goal set today Pt will Ambulate: >150 feet;with modified independence;with least restrictive assistive device PT Goal: Ambulate - Progress: Goal set today Additional Goals Additional Goal #1: Patient will demonstrate and verbalize knowledge of 3/3 back precautions.  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   Prior Functioning  Home Living Lives With: Alone Available Help at Discharge: Friend(s);Family;Available PRN/intermittently Type of Home: Independent living facility Home Access: Level entry;Elevator Home Layout: One level Bathroom Shower/Tub: Tub/shower unit Home Adaptive Equipment: Shower chair without back;Walker - four wheeled;Walker - rolling;Grab bars around toilet Additional Comments: 1 grab bar in shower that she can reach but it seems she might have to bend to reach it Prior Function Level of Independence: Independent Driving: Yes (doesn't have a car so son drives her places) Vocation: Retired Musician: No difficulties    Copywriter, advertising Arousal/Alertness: Awake/alert Behavior During Therapy: WFL for tasks assessed/performed Overall Cognitive Status: Within Functional Limits for tasks assessed    Extremity/Trunk Assessment Right Upper Extremity Assessment RUE ROM/Strength/Tone: [facility] for tasks assessed Left Upper Extremity Assessment LUE ROM/Strength/Tone: WFL for tasks assessed Right Lower Extremity Assessment RLE ROM/Strength/Tone: Deficits RLE ROM/Strength/Tone Deficits: generally weak RLE Sensation: WFL - Light Touch Left Lower Extremity Assessment LLE ROM/Strength/Tone: Deficits LLE ROM/Strength/Tone Deficits: generally weak LLE Sensation: WFL - Light Touch Trunk Assessment Trunk Assessment: Normal   Balance    End of Session PT - End of Session Equipment Utilized During Treatment: Gait belt Activity Tolerance: Patient tolerated treatment well Patient left: in bed;with call bell/phone within reach Nurse Communication: Mobility status  GP     [facility] HELEN 09/13/2012, 9:11 AM

## 2012-09-13 NOTE — Progress Notes (Signed)
Patient spiked fever 103F at 0600. Patient said she feels little warm. Tylenol given. Encouraged IS, achieved 1750 ml. Will wait the temp. to come down before we mobilize the patient. Foley still in place. Hemovac drained 5 ml. Will continue to monitor closely. K/RN, SCRN

## 2012-09-13 NOTE — Progress Notes (Signed)
Occupational Therapy Evaluation Patient Details Name: Yesenia [name] MRN: [id number] DOB: [birth year] Today's Date: 09/13/2012 Time: 4098-1191 OT Time Calculation (min): 24 min  OT Assessment / Plan / Recommendation Clinical Impression  62 y/o female s/p L4-5 lumbar fusion POD #1. PTA Pt was independent in mobility and ADL. Pt would benefit from skilled OT services while in Acute care. Next session focus on bathtub transfer.    OT Assessment  Patient needs continued OT Services    Follow Up Recommendations  Other (comment) (TBD)    Barriers to Discharge Decreased caregiver support Pt lives in an Independent Living Facility and needs to be Mod Independent before going home  Equipment Recommendations  Other (comment) (TBD)       Frequency  Min 2X/week    Precautions / Restrictions Precautions Precautions: Fall;Back Precaution Booklet Issued: No Precaution Comments: Pt recalled 3/3 precautions Required Braces or Orthoses:  (no brace) Restrictions Weight Bearing Restrictions: No   Pertinent Vitals/Pain Pt reported 5/10 pain    ADL  Lower Body Bathing: Set up Where Assessed - Lower Body Bathing: Supine, head of bed up Lower Body Dressing: Performed;Moderate assistance Where Assessed - Lower Body Dressing: Supine, head of bed up Toilet Transfer: Min guard Toilet Transfer Method: Sit to Barista: Raised toilet seat with arms (or 3-in-1 over toilet) Toileting - Clothing Manipulation and Hygiene: Moderate assistance Where Assessed - Toileting Clothing Manipulation and Hygiene: Standing Equipment Used: Gait belt;Rolling walker Transfers/Ambulation Related to ADLs: Pt needs incr time to complete transfers with vc's for safe hand placement and precautions. ADL Comments: Pt put on socks laying in bed but bringing foot up to knee level. Says that when she is at home she uses her pant leg to help her get to her feet for donning undergarments, or pants and  she does not wear socks at home. Pt performed supine instead of EOB for pain management    OT Diagnosis: Generalized weakness;Acute pain  OT Problem List: Decreased strength;Decreased safety awareness;Decreased knowledge of use of DME or AE;Decreased knowledge of precautions;Pain OT Treatment Interventions: Self-care/ADL training;DME and/or AE instruction;Therapeutic activities   OT Goals Acute Rehab OT Goals OT Goal Formulation: With patient Time For Goal Achievement: 09/27/12 Potential to Achieve Goals: Good ADL Goals Pt Will Perform Grooming: with modified independence;Standing at sink ADL Goal: Grooming - Progress: Goal set today Pt Will Perform Lower Body Dressing: with modified independence;Sitting, bed ADL Goal: Lower Body Dressing - Progress: Goal set today Pt Will Transfer to Toilet: with modified independence;Regular height toilet;Maintaining back safety precautions ADL Goal: Toilet Transfer - Progress: Goal set today Pt Will Perform Toileting - Clothing Manipulation: with modified independence;Standing ADL Goal: Toileting - Clothing Manipulation - Progress: Goal set today Pt Will Perform Toileting - Hygiene: with modified independence;Leaning right and/or left on 3-in-1/toilet;with adaptive equipment ADL Goal: Toileting - Hygiene - Progress: Goal set today Pt Will Perform Tub/Shower Transfer: Tub transfer;Shower seat without back;with modified independence;Grab bars;Maintaining back safety precautions ADL Goal: Tub/Shower Transfer - Progress: Goal set today Additional ADL Goal #1: Pt will perform bed mobility modified independent maintaining back precautions prior to ADL.  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"   Prior Functioning     Home Living Lives With: Alone Available Help at Discharge:  Friend(s);Family;Available PRN/intermittently Type of Home: Independent living facility Home Access: Level  entry;Elevator Home Layout: One level Bathroom Shower/Tub: Engineer, manufacturing systems: Standard Bathroom Accessibility: Yes How Accessible: Accessible via walker Home Adaptive Equipment: Shower chair without back;Walker - four wheeled;Walker - rolling;Grab bars around toilet Additional Comments: 1 grab bar in shower that she can reach but it seems she might have to bend to reach it Prior Function Level of Independence: Independent Driving: Yes (but has no car, son drives her) Vocation: Retired Musician: No difficulties Dominant Hand: Right         Vision/Perception Vision - History Baseline Vision: Wears glasses all the time Visual History: Glaucoma;Cataracts;Other (comment) (family history of Macular Degeneration) Patient Visual Report: No change from baseline   Cognition  Cognition Arousal/Alertness: Awake/alert Behavior During Therapy: WFL for tasks assessed/performed Overall Cognitive Status: Within Functional Limits for tasks assessed       Mobility Bed Mobility Bed Mobility: Rolling Right;Right Sidelying to Sit;Sitting - Scoot to Edge of Bed Rolling Right: 4: Min guard;With rail Right Sidelying to Sit: 4: Min guard;With rails;HOB elevated (14 degrees) Details for Bed Mobility Assistance: verbal sequencing for bed mobility, Pt able to perform min guard Transfers: Sit to Stand;Stand to Sit Sit to Stand: 4: Min assist;With upper extremity assist;From bed Stand to Sit: 4: Min assist;With upper extremity assist;To chair/3-in-1 Details for Transfer Assistance: vc's for safe hand placement with RW, assist to rise, and verbal and tactile cues for controlled decent and prevention of bending           End of Session OT - End of Session Equipment Utilized During Treatment: Gait belt;Other (comment) (RW) Activity Tolerance: Patient tolerated  treatment well Patient left: in chair;with call bell/phone within reach Nurse Communication: Mobility status  GO     Sherryl Manges 09/13/2012, 12:14 PM

## 2012-09-13 NOTE — Progress Notes (Signed)
I agree with the following treatment note after reviewing documentation.   Johnston, Ibtisam Benge Brynn   OTR/L Pager: [phone] Office: [phone] .   

## 2012-09-14 LAB — URINE CULTURE

## 2012-09-14 NOTE — Progress Notes (Signed)
I agree with the following treatment note after reviewing documentation.   Johnston, Larri Yehle Brynn   OTR/L Pager: [phone] Office: [phone] .   

## 2012-09-14 NOTE — Progress Notes (Signed)
Physical Therapy Treatment Patient Details Name: Yesenia [name] MRN: [id number] DOB: April 26, 1950 Today's Date: 09/14/2012 Time: 1478-2956 PT Time Calculation (min): 30 min  PT Assessment / Plan / Recommendation Comments on Treatment Session  62 y/o female s/p lumbar fusion POD #2. Progressing nicely today with pain more under control. Ambulating further.     Follow Up Recommendations  Home health PT;Supervision - Intermittent     Does the patient have the potential to tolerate intense rehabilitation     Barriers to Discharge        Equipment Recommendations  None recommended by PT    Recommendations for Other Services    Frequency Min 5X/week   Plan Discharge plan remains appropriate;Frequency needs to be updated    Precautions / Restrictions Precautions Precautions: Fall;Back Precaution Booklet Issued: No Precaution Comments: Pt recalled 3/3 precautions Restrictions Weight Bearing Restrictions: No   Pertinent Vitals/Pain Reports 7-8/10 pain, RN reports recent pre-medication    Mobility  Bed Mobility Bed Mobility: Rolling Left;Rolling Right;Left Sidelying to Sit;Sit to Sidelying Left Rolling Left: 4: Min guard Left Sidelying to Sit: 4: Min guard Sitting - Scoot to [place] of Bed: 4: Min guard Sit to Sidelying Right: 5: Supervision Sit to Sidelying Left: 4: Min assist Scooting to HOB: 3: Mod assist;Other (comment) (with foot of bed elevated) Details for Bed Mobility Assistance: sequencing cues for correct technique with minA to maintain sidelying during sit->sidelying left Transfers Transfers: Sit to Stand;Stand to Sit Sit to Stand: 4: Min guard;With upper extremity assist;From toilet;From bed Stand to Sit: 5: Supervision;With upper extremity assist;To toilet;To bed Details for Transfer Assistance: cues for safe hand placement Ambulation/Gait Ambulation/Gait Assistance: 5: Supervision Ambulation Distance (Feet): 200 Feet Assistive device: Rolling  walker Ambulation/Gait Assistance Details: cues for tall posture and safe positioning within RW, cues for no twisting several times when distracted Gait Pattern: Step-through pattern;Decreased stride length Gait velocity: decreased Stairs: No      PT Goals Acute Rehab PT Goals PT Goal: Rolling Supine to Left Side - Progress: Progressing toward goal PT Goal: Supine/Side to Sit - Progress: Progressing toward goal PT Goal: Sit to Supine/Side - Progress: Progressing toward goal PT Goal: Sit to Stand - Progress: Progressing toward goal PT Goal: Stand to Sit - Progress: Progressing toward goal PT Goal: Ambulate - Progress: Progressing toward goal Additional Goals PT Goal: Additional Goal #1 - Progress: Progressing toward goal  Visit Information  Last PT Received On: 09/14/12 Assistance Needed: +1    Subjective Data  Subjective: I don't feel great. Are there any cute guys out there? Patient Stated Goal: home, independent   Cognition  Cognition Arousal/Alertness: Awake/alert Behavior During Therapy: WFL for tasks assessed/performed Overall Cognitive Status: Within Functional Limits for tasks assessed    Balance     End of Session PT - End of Session Equipment Utilized During Treatment: Gait belt Activity Tolerance: Patient tolerated treatment well Patient left: in bed;with call bell/phone within reach Nurse Communication: Mobility status   GP     [facility] HELEN 09/14/2012, 12:12 PM

## 2012-09-14 NOTE — Progress Notes (Signed)
Occupational Therapy Treatment Patient Details Name: Yesenia [name] MRN: [id number] DOB: 02-May-1950 Today's Date: 09/14/2012 Time: 2440-1027 OT Time Calculation (min): 31 min  OT Assessment / Plan / Recommendation Comments on Treatment Session 62 y/o female s/p L4-5 lumbar fusion. Pt is more alert and moved better today. Pt able to perform ADL at sink level and toileting at supervision.    Follow Up Recommendations  Supervision - Intermittent             Frequency Min 2X/week   Plan Discharge plan remains appropriate    Precautions / Restrictions Precautions Precautions: Fall;Back Precaution Booklet Issued: No Precaution Comments: Pt recalled 3/3 precautions Restrictions Weight Bearing Restrictions: No   Pertinent Vitals/Pain Pt reported 7/10 pain and Pt had just received nausea medication prior to session.    ADL  Grooming: Performed;Wash/dry hands;Supervision/safety Where Assessed - Grooming: Unsupported standing (RUE resting on counter for balance) Lower Body Dressing: Performed;Min guard Where Assessed - Lower Body Dressing: Unsupported sitting Toilet Transfer: Min Pension scheme manager Method: Sit to Barista: Comfort height toilet;Grab bars Toileting - Architect and Hygiene: Supervision/safety Where Assessed - Engineer, mining and Hygiene: Standing Tub/Shower Transfer: Insurance risk surveyor Method: Passenger transport manager: Shower seat without back;Other (comment);Grab bars (Pt already has DME) Equipment Used: Gait belt;Rolling walker Transfers/Ambulation Related to ADLs: Pt needs incr time to complete transfers with vc's for safe hand placement and precautions. ADL Comments: Pt donned socks sitting EOB. Pt performed tub transfer maintaining precautions and using a shower seat with no back (that she already has at home). Pt able to perform toileting maintaining precautions and doing all  clothing manipulation and peri care at supervision level. Pt washed hands supervision level standing at sink with RUE support on counter.     OT Goals Acute Rehab OT Goals OT Goal Formulation: With patient Time For Goal Achievement: 09/27/12 Potential to Achieve Goals: Good ADL Goals Pt Will Perform Grooming: with modified independence;Standing at sink ADL Goal: Grooming - Progress: Progressing toward goals Pt Will Perform Lower Body Dressing: with modified independence;Sitting, bed ADL Goal: Lower Body Dressing - Progress: Progressing toward goals Pt Will Transfer to Toilet: with modified independence;Regular height toilet;Maintaining back safety precautions ADL Goal: Toilet Transfer - Progress: Progressing toward goals Pt Will Perform Toileting - Clothing Manipulation: with modified independence;Standing ADL Goal: Toileting - Clothing Manipulation - Progress: Progressing toward goals Pt Will Perform Toileting - Hygiene: with modified independence;Leaning right and/or left on 3-in-1/toilet;with adaptive equipment ADL Goal: Toileting - Hygiene - Progress: Progressing toward goals Pt Will Perform Tub/Shower Transfer: Tub transfer;Shower seat without back;with modified independence;Grab bars;Maintaining back safety precautions ADL Goal: Tub/Shower Transfer - Progress: Progressing toward goals Additional ADL Goal #1: Pt will perform bed mobility modified independent maintaining back precautions prior to ADL.  Visit Information  Last OT Received On: 09/14/12 Assistance Needed: +1          Cognition  Cognition Arousal/Alertness: Awake/alert Behavior During Therapy: WFL for tasks assessed/performed Overall Cognitive Status: Within Functional Limits for tasks assessed    Mobility  Bed Mobility Bed Mobility: Rolling Left;Left Sidelying to Sit;Sitting - Scoot to [place] of Bed;Scooting to [facility] Rolling Left: 4: Min guard;With rail Left Sidelying to Sit: 4: Min guard;With rails Sitting -  Scoot to Edge of Bed: 4: Min guard Scooting to [facility]: 3: Mod assist;Other (comment) (with foot of bed elevated) Details for Bed Mobility Assistance: verbal step by step sequencing cues for correct log roll technique, needed  min guard to perform bed mobility Transfers Transfers: Sit to Stand;Stand to Sit Sit to Stand: 4: Min assist;With upper extremity assist;From bed Stand to Sit: 4: Min guard;With upper extremity assist;To bed Details for Transfer Assistance: vc's for safe hand placement          End of Session OT - End of Session Equipment Utilized During Treatment: Gait belt;Other (comment) (RW) Activity Tolerance: Patient tolerated treatment well Patient left: in chair;with call bell/phone within reach Nurse Communication: Mobility status  GO     Sherryl Manges 09/14/2012, 12:04 PM

## 2012-09-14 NOTE — Progress Notes (Signed)
Patient ID: Yesenia [name], female   DOB: 08/30/1950, 62 y.o.   MRN: [id number] Subjective:  The patient is alert and pleasant. She is appropriately sore. She looks well.  Objective: Vital signs in last 24 hours: Temp:  [98.8 F (37.1 C)-102.5 F (39.2 C)] 100.2 F (37.9 C) (06/11 1300) Pulse Rate:  [73-94] 88 (06/11 1300) Resp:  [14-20] 16 (06/11 1300) BP: (87-93)/(44-57) 87/50 mmHg (06/11 1300) SpO2:  [88 %-97 %] 96 % (06/11 1300)  Intake/Output from previous day: 06/10 0701 - 06/11 0700 In: 480 [P.O.:480] Out: 545 [Urine:350; Drains:195] Intake/Output this shift: Total I/O In: 360 [P.O.:360] Out: -   Physical exam patient is alert and oriented. She is moving her lower extremities well.  Lab Results:  Recent Labs  09/13/12 0605  WBC 7.7  HGB 10.0*  HCT 32.1*  PLT 204   BMET  Recent Labs  09/13/12 0605  NA 138  K 3.7  CL 101  CO2 31  GLUCOSE 117*  BUN 6  CREATININE 1.00  CALCIUM 9.0    Studies/Results: Dg Chest 1 View  09/13/2012   *RADIOLOGY REPORT*  Clinical Data: Postop back surgery, fever  CHEST - 1 VIEW  Comparison: Portable chest x-ray of 09/12/2012  Findings: No active infiltrate or effusion is seen.  Aeration of the lungs has improved somewhat.  Cardiomegaly is stable.  A left IJ central venous line is unchanged in position.  IMPRESSION: Improved aeration.  Stable mild cardiomegaly.   Original Report Authenticated By: Dwyane Dee, M.D.    Assessment/Plan: Postop day #2: The patient is doing well neurologically. We will mobilize her with PT and OT. She may go home tomorrow.  Fever: The patient's urinalysis is okay, the chest x-ray is okay, her white blood cell count is normal. This may be secondary to atelectasis. We will mobilize her.  LOS: 2 days     Nickalos Petersen D 09/14/2012, 3:50 PM

## 2012-09-14 NOTE — Progress Notes (Signed)
Referred to this CSW on yesterday- spoke with patient who confirms she does not live in ALF as reported but lives in an apartment setting. She declines ALF or SNF at d/c- states she has good support from friends, neighbors and a son who are [place] and can assist. RNCM and Care Coordinator advised of above- CSW to sign off- please contact us if SW needs arise. Reece Levy, MSW, Theresia Majors [phone]

## 2012-09-15 MED ORDER — CEPHALEXIN 500 MG PO CAPS
500.0000 mg | ORAL_CAPSULE | Freq: Three times a day (TID) | ORAL | Status: DC
  Administered 2012-09-15 – 2012-09-18 (×12): 500 mg via ORAL
  Filled 2012-09-15 (×16): qty 1

## 2012-09-15 NOTE — Progress Notes (Signed)
Orthopedic Tech Progress Note Patient Details:  Yesenia [name] May 23, 1950 [id number] Called [employer] for brace order. Patient ID: Yesenia [name], female   DOB: 1951-01-05, 62 y.o.   MRN: [id number]   [name] 09/15/2012, 3:22 PM

## 2012-09-15 NOTE — Progress Notes (Signed)
Patient conTinues to run low BP'S, MD aware, instructed to encourage patient to drink a lot of fluids. As a matter of fact, pt's pain medication has been withhold for now. Will continue to monitor.

## 2012-09-15 NOTE — Progress Notes (Signed)
PT Cancellation Note  Patient Details Name: Yesenia [name] MRN: [id number] DOB: 1950/06/14   Cancelled Treatment:    Reason Eval/Treat Not Completed: Patient not medically ready. BP continues to be low this afternoon. Will f/u tomorrow.   [facility] HELEN 09/15/2012, 3:49 PM Pager : [phone]

## 2012-09-15 NOTE — Progress Notes (Signed)
Patient ID: Yesenia [name], female   DOB: Apr 17, 1950, 62 y.o.   MRN: [id number] Subjective:  The patient is alert and pleasant. She does not feel she is ready to go home. We discussed rehabilitation but the patient declined. She instead wants to wait and go home tomorrow.  Objective: Vital signs in last 24 hours: Temp:  [98.2 F (36.8 C)-101.2 F (38.4 C)] 98.2 F (36.8 C) (06/12 1058) Pulse Rate:  [78-97] 88 (06/12 1058) Resp:  [16-18] 18 (06/12 1058) BP: (73-90)/(34-50) 83/50 mmHg (06/12 1058) SpO2:  [95 %-97 %] 96 % (06/12 1058)  Intake/Output from previous day: 06/11 0701 - 06/12 0700 In: 720 [P.O.:720] Out: 70 [Drains:70] Intake/Output this shift: Total I/O In: 240 [P.O.:240] Out: -   Physical exam the patient is alert and pleasant. Her lower extremity strength is grossly normal.  The patient's wound has some mild erythema. There is no discharge.  Lab Results:  Recent Labs  09/13/12 0605  WBC 7.7  HGB 10.0*  HCT 32.1*  PLT 204   BMET  Recent Labs  09/13/12 0605  NA 138  K 3.7  CL 101  CO2 31  GLUCOSE 117*  BUN 6  CREATININE 1.00  CALCIUM 9.0    Studies/Results: No results found.  Assessment/Plan: Postop day #3: The patient is doing well neurologically. We will plan to mobilize her today and tentatively plan to send her home tomorrow. I'll start Keflex for her wound erythema.  Low-grade fever: As above the patient's wound looks pretty good but is mildly erythematous. I'll start Keflex. The patient's chest x-ray and cultures have been okay.  LOS: 3 days     Yesenia [name] D 09/15/2012, 11:44 AM

## 2012-09-15 NOTE — Progress Notes (Signed)
PT Cancellation Note  Patient Details Name: Yesenia [name] MRN: [id number] DOB: 06-Oct-1950   Cancelled Treatment:    Reason Eval/Treat Not Completed: Medical issues which prohibited therapy. BP 80/30 (45). Will hold PT this morning and check back as patient more medically stable for mobility.    [facility] HELEN 09/15/2012, 8:34 AM Pager: [phone]

## 2012-09-15 NOTE — Progress Notes (Signed)
Orthopedic Tech Progress Note Patient Details:  Yesenia [name] 1951-01-25 [id number] Brace order completed by bio-tech vendor. Patient ID: Yesenia [name], female   DOB: 1951-03-31, 61 y.o.   MRN: [id number]   Yesenia [name] 09/15/2012, 4:45 PM

## 2012-09-16 MED ORDER — HYDROMORPHONE HCL 4 MG PO TABS: 4.0000 mg | ORAL_TABLET | ORAL | Status: DC | PRN

## 2012-09-16 MED ORDER — CEPHALEXIN 500 MG PO CAPS: 500.0000 mg | ORAL_CAPSULE | Freq: Three times a day (TID) | ORAL | Status: DC

## 2012-09-16 MED ORDER — DSS 100 MG PO CAPS: 100.0000 mg | ORAL_CAPSULE | Freq: Two times a day (BID) | ORAL | Status: DC

## 2012-09-16 NOTE — Progress Notes (Signed)
Patient ID: Yesenia [name], female   DOB: 05-31-1950, 62 y.o.   MRN: [id number] The patient is going to stay until tomorrow to get some more physical therapy.

## 2012-09-16 NOTE — Progress Notes (Signed)
Physical Therapy Treatment Patient Details Name: Yesenia [name] MRN: [id number] DOB: [birth year] Today's Date: 09/16/2012 Time: 7829-5621 PT Time Calculation (min): 45 min  PT Assessment / Plan / Recommendation Comments on Treatment Session  62 y/o female s/p lumbar fusion POD #4. Missed therapy yesterday because of BP. Moving well today. Progressing nicely. Encouraged her to perform mobility as independently as she can with nursing staff to practice. Reviewed back precautions and adherence during movement. Education provided on how to don/doff brace and effiency with placement of RW and brace when getting back into bed.     Follow Up Recommendations  Home health PT;Supervision - Intermittent     Does the patient have the potential to tolerate intense rehabilitation     Barriers to Discharge        Equipment Recommendations  None recommended by PT    Recommendations for Other Services    Frequency Min 5X/week   Plan Discharge plan remains appropriate;Frequency needs to be updated    Precautions / Restrictions Precautions Precautions: Fall;Back Precaution Comments: Pt recalled 3/3 precautions Restrictions Weight Bearing Restrictions: No   Pertinent Vitals/Pain Reports moderate incisional/surgical pain 5/10, repositioned for comfort    Mobility  Bed Mobility Rolling Right: 6: Modified independent (Device/Increase time) Rolling Left: 6: Modified independent (Device/Increase time) Left Sidelying to Sit: 5: Supervision;HOB flat Sit to Sidelying Left: 6: Modified independent (Device/Increase time);HOB flat Details for Bed Mobility Assistance: cues for back precautions Transfers Transfers: Sit to Stand;Stand to Sit Sit to Stand: 5: Supervision;With upper extremity assist Stand to Sit: 6: Modified independent (Device/Increase time);With upper extremity assist Details for Transfer Assistance: cues for hand placement Ambulation/Gait Ambulation/Gait Assistance: 5:  Supervision;6: Modified independent (Device/Increase time) Ambulation Distance (Feet): 200 Feet Ambulation/Gait Assistance Details: supervision when backing up to surfaces as she at times she has her weight in her heels, otherwise patient modified independent with forward movement; reminders for decreased bending during turns and slow down when she backs up keeping RW close Gait Pattern: Step-through pattern Gait velocity: decreased Stairs: No      PT Goals Acute Rehab PT Goals PT Goal: Rolling Supine to Left Side - Progress: Met PT Goal: Supine/Side to Sit - Progress: Progressing toward goal PT Goal: Sit to Supine/Side - Progress: Met PT Goal: Sit to Stand - Progress: Progressing toward goal PT Goal: Stand to Sit - Progress: Met PT Goal: Ambulate - Progress: Partly met Additional Goals PT Goal: Additional Goal #1 - Progress: Progressing toward goal  Visit Information  Last PT Received On: 09/16/12 Assistance Needed: +1    Subjective Data  Subjective: Im thinking about going home tomorrow morning.  Patient Stated Goal: home   Cognition  Cognition Arousal/Alertness: Awake/alert Behavior During Therapy: WFL for tasks assessed/performed Overall Cognitive Status: Within Functional Limits for tasks assessed    Balance     End of Session PT - End of Session Equipment Utilized During Treatment: Gait belt Activity Tolerance: Patient tolerated treatment well Patient left: in bed;with call bell/phone within reach Nurse Communication: Mobility status   GP     [facility] HELEN 09/16/2012, 9:04 AM

## 2012-09-16 NOTE — Discharge Summary (Signed)
Physician Discharge Summary  Patient ID: Yesenia [name] MRN: [id number] DOB/AGE: Feb 05, 1951 62 y.o.  Admit date: 09/12/2012 Discharge date: 09/16/2012  Admission Diagnoses: L4-5 spondylolisthesis, spinal stenosis, lumbago, lumbar radiculopathy  Discharge Diagnoses: The same Active Problems:   * No active hospital problems. *   Discharged Condition: good  Hospital Course: I admitted the patient to [facility] [place] on 09/12/2012. On that day I performed an L4-5 decompression, instrumentation, and fusion. The surgery went well.  The patient's postoperative course was only remarkable for some fevers. We checked a urinalysis, urine cultures, CBC, blood cultures, and chest x-ray. This test turned out okay. The patient's wound had some mild erythema so I started her on Keflex.  On 09/16/2012 the patient was afebrile, she felt better and requested discharge to home. She was given oral and written discharge instructions. All her questions were answered.  Consults: None Significant Diagnostic Studies: None Treatments: L4-5 decompression, instrumentation, and fusion Discharge Exam: Blood pressure 97/53, pulse 73, temperature 98.8 F (37.1 C), temperature source Oral, resp. rate 18, height 5\' 1"  (1.549 m), weight 117.7 kg (259 lb 7.7 oz), SpO2 96.00%. The patient is alert and oriented. Her strength is normal in her lower extremities. Her wound is healing well without signs of infection.  Disposition: Home  Discharge Orders   Future Orders Complete By Expires     Call MD for:  difficulty breathing, headache or visual disturbances  As directed     Call MD for:  extreme fatigue  As directed     Call MD for:  hives  As directed     Call MD for:  persistant dizziness or light-headedness  As directed     Call MD for:  persistant nausea and vomiting  As directed     Call MD for:  redness, tenderness, or signs of infection (pain, swelling, redness, odor or green/yellow discharge around  incision site)  As directed     Call MD for:  severe uncontrolled pain  As directed     Call MD for:  temperature >100.4  As directed     Diet - low sodium heart healthy  As directed     Discharge instructions  As directed     Comments:      Call [phone] for a followup appointment. Take a stool softener while you are using pain medications.    Driving Restrictions  As directed     Comments:      Do not drive for 2 weeks.    Increase activity slowly  As directed     Lifting restrictions  As directed     Comments:      Do not lift more than 5 pounds. No excessive bending or twisting.    May shower / Bathe  As directed     Comments:      He may shower after the pain she is removed 3 days after surgery. Leave the incision alone.    Remove dressing in 48 hours  As directed     Comments:      Your stitches are under the scan and will dissolve by themselves. The Steri-Strips will fall off after you take a few showers. Do not rub back or pick at the wound, Leave the wound alone.        Medication List    STOP taking these medications       oxyCODONE-acetaminophen 7.5-325 MG per tablet  Commonly known as:  PERCOCET  TAKE these medications       albuterol 108 (90 BASE) MCG/ACT inhaler  Commonly known as:  PROVENTIL HFA;VENTOLIN HFA  Inhale 2 puffs into the lungs every 6 (six) hours as needed.     ALPRAZolam 0.5 MG tablet  Commonly known as:  XANAX  Take 0.5 mg by mouth 4 (four) times daily as needed. Anxiety     cephALEXin 500 MG capsule  Commonly known as:  KEFLEX  Take 1 capsule (500 mg total) by mouth 4 (four) times daily -  with meals and at bedtime.     clotrimazole-betamethasone cream  Commonly known as:  LOTRISONE  Apply 1 application topically daily as needed. Rash     dexlansoprazole 60 MG capsule  Commonly known as:  DEXILANT  Take 1 capsule (60 mg total) by mouth daily.     DSS 100 MG Caps  Take 100 mg by mouth 2 (two) times daily.     HYDROmorphone 4  MG tablet  Commonly known as:  DILAUDID  Take 1 tablet (4 mg total) by mouth every 4 (four) hours as needed.     levothyroxine 88 MCG tablet  Commonly known as:  SYNTHROID, LEVOTHROID  Take 1 tablet (88 mcg total) by mouth daily.     phentermine 37.5 MG tablet  Commonly known as:  ADIPEX-P  Take 1 tablet (37.5 mg total) by mouth daily before breakfast.     pregabalin 100 MG capsule  Commonly known as:  LYRICA  Take 1 capsule (100 mg total) by mouth 3 (three) times daily.     traZODone 100 MG tablet  Commonly known as:  DESYREL  Take 100 mg by mouth at bedtime.     valsartan-hydrochlorothiazide 160-25 MG per tablet  Commonly known as:  DIOVAN HCT  Take 1 tablet by mouth daily.         SignedCristi Loron 09/16/2012, 7:31 AM

## 2012-09-16 NOTE — Care Management Note (Unsigned)
    Page 1 of 1   09/16/2012     11:54:15 AM   CARE MANAGEMENT NOTE 09/16/2012  Patient:  Yesenia [name], Yesenia [name]   Account Number:  [id number]  Date Initiated:  09/16/2012  Documentation initiated by:  Jacquelynn Cree  Subjective/Objective Assessment:   admitted postop L4-5 PLIF     Action/Plan:   PT/OT evals-recommended HHPT   Anticipated DC Date:  09/17/2012   Anticipated DC Plan:  HOME W HOME HEALTH SERVICES      DC Planning Services  CM consult      Choice offered to / List presented to:  C-1 Patient        HH arranged  HH-2 PT      [facility] agency  [facility]   Status of service:  Completed, signed off Medicare Important Message given?   (If response is "NO", the following Medicare IM given date fields will be blank) Date Medicare IM given:   Date Additional Medicare IM given:    Discharge Disposition:    Per UR Regulation:  Reviewed for med. necessity/level of care/duration of stay  If discussed at Long Length of Stay Meetings, dates discussed:    Comments:  09/16/12 1050 Elmer Bales RN, MSN CM- Met with patient to discuss home health.  Pt chose to use [facility].  Mary with [facility] was notified and accepted referral. Pt updated.

## 2012-09-17 MED ORDER — FLEET ENEMA 7-19 GM/118ML RE ENEM: 1.0000 | ENEMA | Freq: Every day | RECTAL | Status: DC | PRN

## 2012-09-17 NOTE — Progress Notes (Signed)
Patient ID: Yesenia [name], female   DOB: [birth year], 62 y.o.   MRN: [id number] pain5-5/10. No weakness.sensory nl. See orders

## 2012-09-17 NOTE — Progress Notes (Signed)
Physical Therapy Treatment Patient Details Name: [name] MRN: [id number] DOB: [birth year] Today's Date: 09/17/2012 Time: 4098-1191 PT Time Calculation (min): 21 min  PT Assessment / Plan / Recommendation Comments on Treatment Session  62 y/o female s/p lumbar fusion POD #5. Moving well today. Wants to push back d/c to tomorrow. Close to meeting all of her goals. Need to focus on safety with transfers and bed mobility tomorrow. Will attempt to see her prior to d/c tomorrow however if unable she does have HHPT follow up. Encouraged her to ambulate 2 more times with nsg today and to practice getting in and out of flat bed for improved independence.     Follow Up Recommendations  Home health PT;Supervision - Intermittent     Does the patient have the potential to tolerate intense rehabilitation     Barriers to Discharge        Equipment Recommendations  None recommended by PT    Recommendations for Other Services    Frequency     Plan Discharge plan remains appropriate;Frequency remains appropriate    Precautions / Restrictions Precautions Precautions: Fall;Back Precaution Booklet Issued: No Precaution Comments: Pt recalled 3/3 precautions Restrictions Weight Bearing Restrictions: No   Pertinent Vitals/Pain Reports minimal surgical pain    Mobility  Bed Mobility Bed Mobility: Sit to Sidelying Left;Rolling Right Sit to Sidelying Left: 5: Supervision Details for Bed Mobility Assistance: needs reminders for safe sequencing and technique Transfers Transfers: Sit to Stand;Stand to Sit Sit to Stand: 5: Supervision;With upper extremity assist Stand to Sit: 6: Modified independent (Device/Increase time);With upper extremity assist Details for Transfer Assistance: cues for hand placement Ambulation/Gait Ambulation/Gait Assistance: 6: Modified independent (Device/Increase time) Ambulation Distance (Feet): 200 Feet Assistive device: Rolling walker Ambulation/Gait  Assistance Details: slow speed but no major concerns with safety this session Gait Pattern: Step-through pattern;Decreased stride length;Wide base of support General Gait Details: slow and cautious, decreased trunk rotation     PT Goals Acute Rehab PT Goals PT Goal: Sit to Supine/Side - Progress: Progressing toward goal PT Goal: Sit to Stand - Progress: Progressing toward goal PT Goal: Stand to Sit - Progress: Met PT Goal: Ambulate - Progress: Met Additional Goals PT Goal: Additional Goal #1 - Progress: Progressing toward goal  Visit Information  Last PT Received On: 09/17/12 Assistance Needed: +1    Subjective Data  Subjective: I need to pay attention to my surroundings more.  Patient Stated Goal: home tomorrow   Cognition  Cognition Arousal/Alertness: Awake/alert Behavior During Therapy: WFL for tasks assessed/performed Overall Cognitive Status: Within Functional Limits for tasks assessed    Balance     End of Session PT - End of Session Equipment Utilized During Treatment: Gait belt Activity Tolerance: Patient tolerated treatment well Patient left: in bed;with call bell/phone within reach Nurse Communication: Mobility status   GP     [facility] HELEN 09/17/2012, 10:35 AM

## 2012-09-18 NOTE — Progress Notes (Signed)
Subjective: Patient reports doing well.   Objective: Vital signs in last 24 hours: Temp:  [98.6 F (37 C)-99.7 F (37.6 C)] 98.7 F (37.1 C) (06/15 0600) Pulse Rate:  [72-83] 76 (06/15 0600) Resp:  [16-20] 18 (06/15 0600) BP: (74-113)/(30-68) 74/30 mmHg (06/15 0600) SpO2:  [90 %-97 %] 90 % (06/15 0600)  Intake/Output from previous day:   Intake/Output this shift:    Physical Exam: Up and ambulating.  Lab Results: No results found for this basename: WBC, HGB, HCT, PLT,  in the last 72 hours BMET No results found for this basename: NA, K, CL, CO2, GLUCOSE, BUN, CREATININE, CALCIUM,  in the last 72 hours  Studies/Results: No results found.  Assessment/Plan: D/C home.  F/U with Dr. Lovell Sheehan in office.    LOS: 6 days    Dorian Heckle, MD 09/18/2012, 7:33 AM

## 2012-09-18 NOTE — Progress Notes (Signed)
Occupational Therapy Treatment Patient Details Name: Yesenia [name] MRN: [id number] DOB: Jun 19, 1950 Today's Date: 09/18/2012 Time: 4098-1191 OT Time Calculation (min): 12 min  OT Assessment / Plan / Recommendation Comments on Treatment Session Pt has made excellent progress and is safe for d/c home.  Pt reports she feels all ADL needs have been met and she has no further questions. Pt has met/partly met all goals.    Follow Up Recommendations  Supervision - Intermittent    Barriers to Discharge       Equipment Recommendations  None recommended by OT    Recommendations for Other Services    Frequency     Plan Discharge plan remains appropriate;All goals met and education completed, patient discharged from OT services    Precautions / Restrictions Precautions Precautions: Fall;Back Precaution Booklet Issued: No Precaution Comments: Pt recalled 3/3 precautions Required Braces or Orthoses: Spinal Brace Spinal Brace: Lumbar corset;Applied in sitting position   Pertinent Vitals/Pain See vitals    ADL  Lower Body Bathing: Simulated;Modified independent Where Assessed - Lower Body Bathing: Unsupported sit to stand Lower Body Dressing: Performed;Modified independent Where Assessed - Lower Body Dressing: Unsupported sit to stand Toilet Transfer: Simulated;Modified independent Toilet Transfer Method:  (ambulating) Acupuncturist:  (bed) Toileting - Clothing Manipulation and Hygiene: Simulated;Modified independent Where Assessed - Toileting Clothing Manipulation and Hygiene: Standing Transfers/Ambulation Related to ADLs: Mod I with RW ambulating in room.  However, VC x1 for hand placement, otherwise demo'd safe technique. ADL Comments: Pt verbalized and demo'd safe LB dressing technique. Educated pt on having her son place commonly used ADL/IADL items at countertop height, and pt reports she has already done that.  Pt demo'ing good understanding of back precautions and  how to adhere to them.  Pt declining practicing tub transfer at this time but verbalized safe hand placement and use of shower chair during tub transfer.    OT Diagnosis:    OT Problem List:   OT Treatment Interventions:     OT Goals ADL Goals Pt Will Perform Grooming: with modified independence;Standing at sink ADL Goal: Grooming - Progress: Partly met (last session) Pt Will Perform Lower Body Dressing: with modified independence;Sitting, bed ADL Goal: Lower Body Dressing - Progress: Met Pt Will Transfer to Toilet: with modified independence;Regular height toilet;Maintaining back safety precautions ADL Goal: Toilet Transfer - Progress: Met Pt Will Perform Toileting - Clothing Manipulation: with modified independence;Standing ADL Goal: Toileting - Clothing Manipulation - Progress: Met Pt Will Perform Toileting - Hygiene: with modified independence;Leaning right and/or left on 3-in-1/toilet;with adaptive equipment ADL Goal: Toileting - Hygiene - Progress: Met Pt Will Perform Tub/Shower Transfer: Tub transfer;Shower seat without back;with modified independence;Grab bars;Maintaining back safety precautions ADL Goal: Tub/Shower Transfer - Progress: Partly met Additional ADL Goal #1: Pt will perform bed mobility modified independent maintaining back precautions prior to ADL. ADL Goal: Additional Goal #1 - Progress: Met  Visit Information  Last OT Received On: 09/18/12 Assistance Needed: +1    Subjective Data      Prior Functioning       Cognition  Cognition Arousal/Alertness: Awake/alert Behavior During Therapy: WFL for tasks assessed/performed Overall Cognitive Status: Within Functional Limits for tasks assessed    Mobility  Bed Mobility Bed Mobility: Right Sidelying to Sit;Sit to Sidelying Right Right Sidelying to Sit: 6: Modified independent (Device/Increase time);HOB flat Sit to Sidelying Right: 6: Modified independent (Device/Increase time);HOB flat Details for Bed  Mobility Assistance: demo'd correct technique Transfers Transfers: Sit to Stand;Stand to Sit Sit to  Stand: 5: Supervision;From chair/3-in-1;From bed;With armrests;With upper extremity assist;6: Modified independent (Device/Increase time) Stand to Sit: 6: Modified independent (Device/Increase time);To chair/3-in-1;To bed;With armrests;With upper extremity assist Details for Transfer Assistance: Required VC x1 for safe hand placement but otherwise demo'd correct hand placement and safe technique remainder of transfers.    Exercises      Balance     End of Session OT - End of Session Equipment Utilized During Treatment: Gait belt;Back brace Activity Tolerance: Patient tolerated treatment well Patient left: in chair;with call bell/phone within reach Nurse Communication: Mobility status  GO   09/18/2012 Cipriano Mile OTR/L Pager [phone] Office [phone]   Cipriano Mile 09/18/2012, 8:41 AM

## 2012-09-18 NOTE — Progress Notes (Signed)
Physical Therapy Treatment Patient Details Name: Yesenia [name] MRN: [id number] DOB: 1950-05-09 Today's Date: 09/18/2012 Time: 8657-8469 PT Time Calculation (min): 12 min  PT Assessment / Plan / Recommendation Comments on Treatment Session  62 y/o female s/p lumbar fusion POD #6.  Pt continues to move well and ready to d/c home today.  Pt had no further questions and able to recall all back precautions.  Pt able to perform bed mobility with mod (I) with no cues.  Pt continued to need cues for hand placement and safety with transfers.    Follow Up Recommendations  Home health PT;Supervision - Intermittent     Does the patient have the potential to tolerate intense rehabilitation     Barriers to Discharge        Equipment Recommendations  None recommended by PT    Recommendations for Other Services    Frequency Min 5X/week   Plan Discharge plan remains appropriate;Frequency remains appropriate    Precautions / Restrictions Precautions Precautions: Fall;Back Precaution Booklet Issued: No Precaution Comments: Pt recalled 3/3 precautions Required Braces or Orthoses: Spinal Brace Spinal Brace: Lumbar corset;Applied in sitting position Restrictions Weight Bearing Restrictions: No   Pertinent Vitals/Pain No c/o pain    Mobility  Bed Mobility Bed Mobility: Right Sidelying to Sit;Sit to Sidelying Right Right Sidelying to Sit: 6: Modified independent (Device/Increase time);HOB flat Sit to Sidelying Right: 6: Modified independent (Device/Increase time);HOB flat Details for Bed Mobility Assistance: demo'd correct technique Transfers Transfers: Sit to Stand;Stand to Sit Sit to Stand: 5: Supervision;From chair/3-in-1;From bed;With armrests;With upper extremity assist;6: Modified independent (Device/Increase time) Stand to Sit: 6: Modified independent (Device/Increase time);To chair/3-in-1;To bed;With armrests;With upper extremity assist Details for Transfer Assistance: Required  VC x1 for safe hand placement but otherwise demo'd correct hand placement and safe technique remainder of transfers. Ambulation/Gait Ambulation/Gait Assistance: 6: Modified independent (Device/Increase time) Ambulation Distance (Feet): 15 Feet Assistive device: Rolling walker Ambulation/Gait Assistance Details: Pt ready to d/c home but willing to perform bed mobility however rather not ambulate at this time Gait Pattern: Step-through pattern;Decreased stride length;Wide base of support Gait velocity: decreased    Exercises     PT Diagnosis:    PT Problem List:   PT Treatment Interventions:     PT Goals Acute Rehab PT Goals PT Goal Formulation: With patient Time For Goal Achievement: 09/20/12 Potential to Achieve Goals: Good Pt will Roll Supine to Left Side: with modified independence PT Goal: Rolling Supine to Left Side - Progress: Met Pt will go Supine/Side to Sit: with modified independence PT Goal: Supine/Side to Sit - Progress: Met Pt will go Sit to Supine/Side: with modified independence PT Goal: Sit to Supine/Side - Progress: Met Pt will go Sit to Stand: with modified independence PT Goal: Sit to Stand - Progress: Progressing toward goal Pt will go Stand to Sit: with modified independence PT Goal: Stand to Sit - Progress: Progressing toward goal Pt will Ambulate: >150 feet;with modified independence;with least restrictive assistive device PT Goal: Ambulate - Progress: Partly met  Visit Information  Last PT Received On: 09/18/12 Assistance Needed: +1    Subjective Data  Subjective: "I'm ready to go home." Patient Stated Goal: Home today   Cognition  Cognition Arousal/Alertness: Awake/alert Behavior During Therapy: WFL for tasks assessed/performed Overall Cognitive Status: Within Functional Limits for tasks assessed    Balance     End of Session PT - End of Session Equipment Utilized During Treatment: Gait belt;Back brace Activity Tolerance: Patient tolerated  treatment well  Patient left: in chair;with call bell/phone within reach Nurse Communication: Mobility status   GP     Yesenia [name] 09/18/2012, 8:56 AM Jake Shark, PT DPT [phone]

## 2012-09-19 LAB — CULTURE, BLOOD (ROUTINE X 2): Culture: NO GROWTH

## 2012-09-23 ENCOUNTER — Inpatient Hospital Stay (HOSPITAL_COMMUNITY): Payer: PRIVATE HEALTH INSURANCE | Admitting: Anesthesiology

## 2012-09-23 ENCOUNTER — Encounter (HOSPITAL_COMMUNITY): Payer: Self-pay | Admitting: Anesthesiology

## 2012-09-23 ENCOUNTER — Inpatient Hospital Stay (HOSPITAL_COMMUNITY)
Admission: AD | Admit: 2012-09-23 | Discharge: 2012-09-27 | DRG: 858 | Disposition: A | Discharge status: Home / Self Care | Payer: PRIVATE HEALTH INSURANCE | Source: Ambulatory Visit | Attending: Neurosurgery | Admitting: Neurosurgery

## 2012-09-23 ENCOUNTER — Encounter (HOSPITAL_COMMUNITY)
Admission: AD | Disposition: A | Discharge status: Home / Self Care | Payer: Self-pay | Source: Ambulatory Visit | Attending: Neurological Surgery

## 2012-09-23 ENCOUNTER — Encounter (HOSPITAL_COMMUNITY): Payer: Self-pay | Admitting: *Deleted

## 2012-09-23 DIAGNOSIS — I10 Essential (primary) hypertension: Secondary | ICD-10-CM | POA: Diagnosis present

## 2012-09-23 DIAGNOSIS — E119 Type 2 diabetes mellitus without complications: Secondary | ICD-10-CM | POA: Diagnosis present

## 2012-09-23 DIAGNOSIS — H409 Unspecified glaucoma: Secondary | ICD-10-CM | POA: Diagnosis present

## 2012-09-23 DIAGNOSIS — Z79899 Other long term (current) drug therapy: Secondary | ICD-10-CM

## 2012-09-23 DIAGNOSIS — F411 Generalized anxiety disorder: Secondary | ICD-10-CM | POA: Diagnosis present

## 2012-09-23 DIAGNOSIS — T8140XA Infection following a procedure, unspecified, initial encounter: Principal | ICD-10-CM | POA: Diagnosis present

## 2012-09-23 DIAGNOSIS — M810 Age-related osteoporosis without current pathological fracture: Secondary | ICD-10-CM | POA: Diagnosis present

## 2012-09-23 DIAGNOSIS — IMO0001 Reserved for inherently not codable concepts without codable children: Secondary | ICD-10-CM | POA: Diagnosis present

## 2012-09-23 DIAGNOSIS — Y831 Surgical operation with implant of artificial internal device as the cause of abnormal reaction of the patient, or of later complication, without mention of misadventure at the time of the procedure: Secondary | ICD-10-CM | POA: Diagnosis present

## 2012-09-23 DIAGNOSIS — M48061 Spinal stenosis, lumbar region without neurogenic claudication: Secondary | ICD-10-CM

## 2012-09-23 DIAGNOSIS — K7689 Other specified diseases of liver: Secondary | ICD-10-CM | POA: Diagnosis present

## 2012-09-23 DIAGNOSIS — Z87442 Personal history of urinary calculi: Secondary | ICD-10-CM

## 2012-09-23 DIAGNOSIS — E039 Hypothyroidism, unspecified: Secondary | ICD-10-CM | POA: Diagnosis present

## 2012-09-23 HISTORY — PX: LUMBAR WOUND DEBRIDEMENT: SHX1988

## 2012-09-23 LAB — GRAM STAIN

## 2012-09-23 LAB — GLUCOSE, CAPILLARY: Glucose-Capillary: 122 mg/dL — ABNORMAL HIGH (ref 70–99)

## 2012-09-23 SURGERY — LUMBAR WOUND DEBRIDEMENT
Anesthesia: General | Site: Back | Wound class: Dirty or Infected

## 2012-09-23 MED ORDER — PHENTERMINE HCL 37.5 MG PO TABS
37.5000 mg | ORAL_TABLET | Freq: Every day | ORAL | Status: DC
  Filled 2012-09-23: qty 1

## 2012-09-23 MED ORDER — MIDAZOLAM HCL 5 MG/5ML IJ SOLN
INTRAMUSCULAR | Status: DC | PRN
  Administered 2012-09-23: 2 mg via INTRAVENOUS

## 2012-09-23 MED ORDER — SODIUM CHLORIDE 0.9 % IJ SOLN
3.0000 mL | Freq: Two times a day (BID) | INTRAMUSCULAR | Status: DC
  Administered 2012-09-23 – 2012-09-26 (×3): 3 mL via INTRAVENOUS

## 2012-09-23 MED ORDER — ONDANSETRON HCL 4 MG/2ML IJ SOLN: 4.0000 mg | INTRAMUSCULAR | Status: DC | PRN

## 2012-09-23 MED ORDER — HYDROMORPHONE HCL PF 1 MG/ML IJ SOLN
0.2500 mg | INTRAMUSCULAR | Status: DC | PRN
  Administered 2012-09-23 (×4): 0.5 mg via INTRAVENOUS

## 2012-09-23 MED ORDER — TRAZODONE HCL 100 MG PO TABS
100.0000 mg | ORAL_TABLET | Freq: Every day | ORAL | Status: DC
  Administered 2012-09-23 – 2012-09-26 (×4): 100 mg via ORAL
  Filled 2012-09-23 (×5): qty 1

## 2012-09-23 MED ORDER — LACTATED RINGERS IV SOLN
INTRAVENOUS | Status: DC | PRN
  Administered 2012-09-23: 18:00:00 via INTRAVENOUS

## 2012-09-23 MED ORDER — GLYCOPYRROLATE 0.2 MG/ML IJ SOLN
INTRAMUSCULAR | Status: DC | PRN
  Administered 2012-09-23: 0.6 mg via INTRAVENOUS

## 2012-09-23 MED ORDER — HYDROCHLOROTHIAZIDE 25 MG PO TABS
25.0000 mg | ORAL_TABLET | Freq: Every day | ORAL | Status: DC
  Administered 2012-09-26 – 2012-09-27 (×2): 25 mg via ORAL
  Filled 2012-09-23 (×4): qty 1

## 2012-09-23 MED ORDER — ROCURONIUM BROMIDE 100 MG/10ML IV SOLN
INTRAVENOUS | Status: DC | PRN
  Administered 2012-09-23: 30 mg via INTRAVENOUS

## 2012-09-23 MED ORDER — LEVOTHYROXINE SODIUM 75 MCG PO TABS
75.0000 ug | ORAL_TABLET | Freq: Every day | ORAL | Status: DC
  Administered 2012-09-24 – 2012-09-27 (×4): 75 ug via ORAL
  Filled 2012-09-23 (×5): qty 1

## 2012-09-23 MED ORDER — SODIUM CHLORIDE 0.9 % IV SOLN
250.0000 mL | INTRAVENOUS | Status: DC
  Administered 2012-09-24: 250 mL via INTRAVENOUS

## 2012-09-23 MED ORDER — HYDROMORPHONE HCL PF 1 MG/ML IJ SOLN
0.2500 mg | INTRAMUSCULAR | Status: DC | PRN
  Administered 2012-09-23 (×2): 0.5 mg via INTRAVENOUS

## 2012-09-23 MED ORDER — SODIUM CHLORIDE 0.9 % IJ SOLN: 3.0000 mL | INTRAMUSCULAR | Status: DC | PRN

## 2012-09-23 MED ORDER — SUCCINYLCHOLINE CHLORIDE 20 MG/ML IJ SOLN
INTRAMUSCULAR | Status: DC | PRN
  Administered 2012-09-23: 100 mg via INTRAVENOUS

## 2012-09-23 MED ORDER — ACETAMINOPHEN 650 MG RE SUPP: 650.0000 mg | RECTAL | Status: DC | PRN

## 2012-09-23 MED ORDER — NEOSTIGMINE METHYLSULFATE 1 MG/ML IJ SOLN
INTRAMUSCULAR | Status: DC | PRN
  Administered 2012-09-23: 4 mg via INTRAVENOUS

## 2012-09-23 MED ORDER — VANCOMYCIN HCL IN DEXTROSE 1-5 GM/200ML-% IV SOLN
INTRAVENOUS | Status: AC
  Administered 2012-09-23: 1000 mg via INTRAVENOUS
  Filled 2012-09-23: qty 200

## 2012-09-23 MED ORDER — OXYCODONE HCL 5 MG/5ML PO SOLN: 5.0000 mg | Freq: Once | ORAL | Status: DC | PRN

## 2012-09-23 MED ORDER — DEXTROSE 5 % IV SOLN
1.0000 g | INTRAVENOUS | Status: DC
  Filled 2012-09-23: qty 10

## 2012-09-23 MED ORDER — HEMOSTATIC AGENTS (NO CHARGE) OPTIME
TOPICAL | Status: DC | PRN
  Administered 2012-09-23: 1 via TOPICAL

## 2012-09-23 MED ORDER — THROMBIN 5000 UNITS EX SOLR
CUTANEOUS | Status: DC | PRN
  Administered 2012-09-23 (×2): 5000 [IU] via TOPICAL

## 2012-09-23 MED ORDER — 0.9 % SODIUM CHLORIDE (POUR BTL) OPTIME
TOPICAL | Status: DC | PRN
  Administered 2012-09-23: 1000 mL

## 2012-09-23 MED ORDER — ALPRAZOLAM 0.5 MG PO TABS
0.5000 mg | ORAL_TABLET | Freq: Four times a day (QID) | ORAL | Status: DC | PRN
  Administered 2012-09-26 – 2012-09-27 (×5): 0.5 mg via ORAL
  Filled 2012-09-23 (×5): qty 1

## 2012-09-23 MED ORDER — PHENOL 1.4 % MT LIQD: 1.0000 | OROMUCOSAL | Status: DC | PRN

## 2012-09-23 MED ORDER — ONDANSETRON HCL 4 MG/2ML IJ SOLN
INTRAMUSCULAR | Status: DC | PRN
  Administered 2012-09-23: 4 mg via INTRAVENOUS

## 2012-09-23 MED ORDER — VANCOMYCIN HCL 10 G IV SOLR
1250.0000 mg | Freq: Two times a day (BID) | INTRAVENOUS | Status: DC
  Administered 2012-09-24 – 2012-09-27 (×7): 1250 mg via INTRAVENOUS
  Filled 2012-09-23 (×8): qty 1250

## 2012-09-23 MED ORDER — OXYCODONE HCL 5 MG PO TABS
5.0000 mg | ORAL_TABLET | Freq: Once | ORAL | Status: DC | PRN
  Administered 2012-09-23: 5 mg via ORAL

## 2012-09-23 MED ORDER — ACETAMINOPHEN 325 MG PO TABS: 650.0000 mg | ORAL_TABLET | ORAL | Status: DC | PRN

## 2012-09-23 MED ORDER — POTASSIUM CHLORIDE IN NACL 20-0.9 MEQ/L-% IV SOLN
INTRAVENOUS | Status: DC
  Administered 2012-09-23 – 2012-09-26 (×4): via INTRAVENOUS
  Filled 2012-09-23 (×8): qty 1000

## 2012-09-23 MED ORDER — PANTOPRAZOLE SODIUM 40 MG PO TBEC
40.0000 mg | DELAYED_RELEASE_TABLET | Freq: Every day | ORAL | Status: DC
  Administered 2012-09-23 – 2012-09-27 (×5): 40 mg via ORAL
  Filled 2012-09-23 (×5): qty 1

## 2012-09-23 MED ORDER — ONDANSETRON HCL 4 MG/2ML IJ SOLN: 4.0000 mg | Freq: Four times a day (QID) | INTRAMUSCULAR | Status: DC | PRN

## 2012-09-23 MED ORDER — ALBUTEROL SULFATE HFA 108 (90 BASE) MCG/ACT IN AERS
2.0000 | INHALATION_SPRAY | Freq: Four times a day (QID) | RESPIRATORY_TRACT | Status: DC | PRN
  Filled 2012-09-23: qty 6.7

## 2012-09-23 MED ORDER — HYDROMORPHONE HCL PF 1 MG/ML IJ SOLN: 0.5000 mg | INTRAMUSCULAR | Status: DC | PRN

## 2012-09-23 MED ORDER — DOCUSATE SODIUM 100 MG PO CAPS
100.0000 mg | ORAL_CAPSULE | Freq: Two times a day (BID) | ORAL | Status: DC
  Administered 2012-09-23 – 2012-09-27 (×8): 100 mg via ORAL
  Filled 2012-09-23 (×8): qty 1

## 2012-09-23 MED ORDER — SODIUM CHLORIDE 0.9 % IR SOLN
Status: DC | PRN
  Administered 2012-09-23: 19:00:00

## 2012-09-23 MED ORDER — LIDOCAINE HCL (CARDIAC) 20 MG/ML IV SOLN
INTRAVENOUS | Status: DC | PRN
  Administered 2012-09-23: 70 mg via INTRAVENOUS

## 2012-09-23 MED ORDER — VALSARTAN-HYDROCHLOROTHIAZIDE 160-25 MG PO TABS: 1.0000 | ORAL_TABLET | Freq: Every day | ORAL | Status: DC

## 2012-09-23 MED ORDER — PROPOFOL 10 MG/ML IV BOLUS
INTRAVENOUS | Status: DC | PRN
  Administered 2012-09-23: 140 mg via INTRAVENOUS

## 2012-09-23 MED ORDER — MENTHOL 3 MG MT LOZG: 1.0000 | LOZENGE | OROMUCOSAL | Status: DC | PRN

## 2012-09-23 MED ORDER — DEXTROSE 5 % IV SOLN
1.0000 g | Freq: Two times a day (BID) | INTRAVENOUS | Status: DC
  Administered 2012-09-23 – 2012-09-26 (×5): 1 g via INTRAVENOUS
  Filled 2012-09-23 (×8): qty 10

## 2012-09-23 MED ORDER — HYDROMORPHONE HCL 2 MG PO TABS
4.0000 mg | ORAL_TABLET | ORAL | Status: DC | PRN
  Administered 2012-09-23 – 2012-09-27 (×16): 4 mg via ORAL
  Filled 2012-09-23 (×16): qty 2

## 2012-09-23 MED ORDER — RIFAMPIN 300 MG PO CAPS
300.0000 mg | ORAL_CAPSULE | Freq: Two times a day (BID) | ORAL | Status: DC
  Administered 2012-09-23 – 2012-09-27 (×8): 300 mg via ORAL
  Filled 2012-09-23 (×9): qty 1

## 2012-09-23 MED ORDER — PREGABALIN 50 MG PO CAPS
100.0000 mg | ORAL_CAPSULE | Freq: Three times a day (TID) | ORAL | Status: DC
  Administered 2012-09-23 – 2012-09-27 (×11): 100 mg via ORAL
  Filled 2012-09-23 (×2): qty 2
  Filled 2012-09-23: qty 1
  Filled 2012-09-23 (×3): qty 2
  Filled 2012-09-23 (×2): qty 1
  Filled 2012-09-23: qty 2
  Filled 2012-09-23: qty 1
  Filled 2012-09-23 (×3): qty 2

## 2012-09-23 MED ORDER — FENTANYL CITRATE 0.05 MG/ML IJ SOLN
INTRAMUSCULAR | Status: DC | PRN
  Administered 2012-09-23 (×2): 50 ug via INTRAVENOUS
  Administered 2012-09-23: 100 ug via INTRAVENOUS
  Administered 2012-09-23 (×2): 50 ug via INTRAVENOUS
  Administered 2012-09-23 (×2): 100 ug via INTRAVENOUS

## 2012-09-23 MED ORDER — IRBESARTAN 150 MG PO TABS
150.0000 mg | ORAL_TABLET | Freq: Every day | ORAL | Status: DC
  Administered 2012-09-25 – 2012-09-27 (×3): 150 mg via ORAL
  Filled 2012-09-23 (×4): qty 1

## 2012-09-23 MED ORDER — PROMETHAZINE HCL 25 MG/ML IJ SOLN
6.2500 mg | INTRAMUSCULAR | Status: DC | PRN
  Administered 2012-09-23: 6.25 mg via INTRAVENOUS
  Filled 2012-09-23: qty 1

## 2012-09-23 SURGICAL SUPPLY — 43 items
APL SKNCLS STERI-STRIP NONHPOA (GAUZE/BANDAGES/DRESSINGS) ×1
BAG DECANTER FOR FLEXI CONT (MISCELLANEOUS) ×3 IMPLANT
BENZOIN TINCTURE PRP APPL 2/3 (GAUZE/BANDAGES/DRESSINGS) ×2 IMPLANT
CANISTER SUCTION 2500CC (MISCELLANEOUS) ×2 IMPLANT
CLOTH BEACON ORANGE TIMEOUT ST (SAFETY) ×2 IMPLANT
DRAPE LAPAROTOMY 100X72X124 (DRAPES) ×2 IMPLANT
DRAPE POUCH INSTRU U-SHP 10X18 (DRAPES) ×2 IMPLANT
DRESSING TELFA 8X3 (GAUZE/BANDAGES/DRESSINGS) ×2 IMPLANT
DRSG OPSITE 4X5.5 SM (GAUZE/BANDAGES/DRESSINGS) ×3 IMPLANT
DURAPREP 26ML APPLICATOR (WOUND CARE) IMPLANT
DURAPREP 6ML APPLICATOR 50/CS (WOUND CARE) IMPLANT
ELECT REM PT RETURN 9FT ADLT (ELECTROSURGICAL) ×2
ELECTRODE REM PT RTRN 9FT ADLT (ELECTROSURGICAL) ×1 IMPLANT
EVACUATOR 1/8 PVC DRAIN (DRAIN) ×1 IMPLANT
GAUZE SPONGE 4X4 16PLY XRAY LF (GAUZE/BANDAGES/DRESSINGS) IMPLANT
GLOVE BIO SURGEON STRL SZ 6.5 (GLOVE) ×4 IMPLANT
GLOVE BIO SURGEON STRL SZ8 (GLOVE) ×2 IMPLANT
GLOVE BIOGEL PI IND STRL 7.0 (GLOVE) ×1 IMPLANT
GLOVE BIOGEL PI INDICATOR 7.0 (GLOVE) ×1
GOWN BRE IMP SLV AUR LG STRL (GOWN DISPOSABLE) ×1 IMPLANT
GOWN BRE IMP SLV AUR XL STRL (GOWN DISPOSABLE) ×1 IMPLANT
GOWN STRL REIN 2XL LVL4 (GOWN DISPOSABLE) ×1 IMPLANT
KIT BASIN OR (CUSTOM PROCEDURE TRAY) ×2 IMPLANT
KIT ROOM TURNOVER OR (KITS) ×2 IMPLANT
NDL HYPO 18GX1.5 BLUNT FILL (NEEDLE) IMPLANT
NEEDLE HYPO 18GX1.5 BLUNT FILL (NEEDLE) IMPLANT
NEEDLE HYPO 25X1 1.5 SAFETY (NEEDLE) IMPLANT
NEEDLE SPNL 20GX3.5 QUINCKE YW (NEEDLE) IMPLANT
NS IRRIG 1000ML POUR BTL (IV SOLUTION) ×2 IMPLANT
PACK LAMINECTOMY NEURO (CUSTOM PROCEDURE TRAY) ×2 IMPLANT
PAD ARMBOARD 7.5X6 YLW CONV (MISCELLANEOUS) ×6 IMPLANT
STRIP CLOSURE SKIN 1/2X4 (GAUZE/BANDAGES/DRESSINGS) ×3 IMPLANT
SUT VIC AB 0 CT1 18XCR BRD8 (SUTURE) ×1 IMPLANT
SUT VIC AB 0 CT1 8-18 (SUTURE) ×2
SUT VIC AB 2-0 CP2 18 (SUTURE) ×3 IMPLANT
SUT VIC AB 3-0 SH 8-18 (SUTURE) ×3 IMPLANT
SWAB CULTURE LIQ STUART DBL (MISCELLANEOUS) ×2 IMPLANT
SYR 20ML ECCENTRIC (SYRINGE) ×2 IMPLANT
SYR 3ML LL SCALE MARK (SYRINGE) IMPLANT
TOWEL OR 17X24 6PK STRL BLUE (TOWEL DISPOSABLE) ×2 IMPLANT
TOWEL OR 17X26 10 PK STRL BLUE (TOWEL DISPOSABLE) ×2 IMPLANT
TUBE ANAEROBIC SPECIMEN COL (MISCELLANEOUS) ×2 IMPLANT
WATER STERILE IRR 1000ML POUR (IV SOLUTION) ×2 IMPLANT

## 2012-09-23 NOTE — Preoperative (Signed)
Beta Blockers   Reason not to administer Beta Blockers:Not Applicable 

## 2012-09-23 NOTE — Anesthesia Preprocedure Evaluation (Addendum)
Anesthesia Evaluation  Patient identified by MRN, date of birth, ID band Patient awake    Reviewed: Allergy & Precautions, H&P , NPO status , Patient's Chart, lab work & pertinent test results  History of Anesthesia Complications Negative for: history of anesthetic complications  Airway Mallampati: III TM Distance: >3 FB Neck ROM: full    Dental  (+) Edentulous Upper, Edentulous Lower and Dental Advisory Given   Pulmonary sleep apnea ,  breath sounds clear to auscultation        Cardiovascular hypertension, Pt. on medications Rhythm:Regular Rate:Normal     Neuro/Psych PSYCHIATRIC DISORDERS Anxiety  Neuromuscular disease    GI/Hepatic hiatal hernia, GERD-  Medicated and Controlled,  Endo/Other  diabetes, Well ControlledHypothyroidism Morbid obesityDoes not take meds diet controlled  Renal/GU      Musculoskeletal negative musculoskeletal ROS (+) Fibromyalgia -  Abdominal (+) + obese,   Peds  Hematology negative hematology ROS (+)   Anesthesia Other Findings   Reproductive/Obstetrics negative OB ROS                        Anesthesia Physical Anesthesia Plan  ASA: III  Anesthesia Plan: General   Post-op Pain Management:    Induction: Intravenous  Airway Management Planned: Oral ETT  Additional Equipment:   Intra-op Plan:   Post-operative Plan: Extubation in OR  Informed Consent: I have reviewed the patients History and Physical, chart, labs and discussed the procedure including the risks, benefits and alternatives for the proposed anesthesia with the patient or authorized representative who has indicated his/her understanding and acceptance.     Plan Discussed with: CRNA, Anesthesiologist and Surgeon  Anesthesia Plan Comments:         Anesthesia Quick Evaluation
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
## 2012-09-23 NOTE — Transfer of Care (Signed)
Immediate Anesthesia Transfer of Care Note  Patient: Yesenia [name]  Procedure(s) Performed: Procedure(s) with comments: LUMBAR WOUND DEBRIDEMENT (N/A) - Irrigation and Debridement of Lumbar Wound Infection  Patient Location: PACU  Anesthesia Type:General  Level of Consciousness: awake, alert  and oriented  Airway & Oxygen Therapy: Patient Spontanous Breathing and Patient connected to face mask oxygen  Post-op Assessment: Report given to PACU RN and Post -op Vital signs reviewed and stable  Post vital signs: Reviewed and stable  Complications: No apparent anesthesia complications

## 2012-09-23 NOTE — Progress Notes (Signed)
ANTIBIOTIC CONSULT NOTE - INITIAL  Pharmacy Consult for Vancomycin and Ceftriaxone Indication: lumbar wound  Allergies  Allergen Reactions  . Morphine Anaphylaxis and Other (See Comments)    Caused patient to CODE .  [name] Aspirin Other (See Comments)    G.I. Upset   . Codeine Itching, Swelling and Rash  . Nalbuphine Itching, Swelling and Rash    Patient Measurements: Height: 5\' 1"  (154.9 cm) Weight: 242 lb (109.77 kg) IBW/kg (Calculated) : 47.8  Vital Signs: Temp: 98.5 F (36.9 C) (06/20 1925) Temp src: Oral (06/20 1652) BP: 111/71 mmHg (06/20 2030) Pulse Rate: 78 (06/20 2030)  Labs from 09/12/12:  Scr 1.00, WBC 7.7  Estimated Creatinine Clearance: 67.7 ml/min (by C-G formula based on Cr of 1).    Microbiology: Recent Results (from the past 720 hour(s))  SURGICAL PCR SCREEN     Status: Abnormal   Collection Time    09/05/12 11:13 AM      Result Value Range Status   MRSA, PCR POSITIVE (*) NEGATIVE Final   Staphylococcus aureus POSITIVE (*) NEGATIVE Final   Comment:            The Xpert SA Assay (FDA     approved for NASAL specimens     in patients over 36 years of age),     is one component of     a comprehensive surveillance     program.  Test performance has     been validated by [employer] for patients greater     than or equal to 16 year old.     It is not intended     to diagnose infection nor to     guide or monitor treatment.  URINE CULTURE     Status: None   Collection Time    09/13/12  8:42 AM      Result Value Range Status   Specimen Description URINE, CATHETERIZED   Final   Special Requests Normal   Final   Culture  Setup Time 09/13/2012 10:08   Final   Colony Count NO GROWTH   Final   Culture NO GROWTH   Final   Report Status 09/14/2012 FINAL   Final  CULTURE, BLOOD (ROUTINE X 2)     Status: None   Collection Time    09/13/12 10:10 AM      Result Value Range Status   Specimen Description BLOOD RIGHT ARM   Final   Special Requests BOTTLES  DRAWN AEROBIC AND ANAEROBIC 10CC   Final   Culture  Setup Time 09/13/2012 23:50   Final   Culture NO GROWTH 5 DAYS   Final   Report Status 09/19/2012 FINAL   Final  CULTURE, BLOOD (ROUTINE X 2)     Status: None   Collection Time    09/13/12 10:15 AM      Result Value Range Status   Specimen Description BLOOD RIGHT ARM   Final   Special Requests BOTTLES DRAWN AEROBIC ONLY 2CC   Final   Culture  Setup Time 09/13/2012 23:49   Final   Culture NO GROWTH 5 DAYS   Final   Report Status 09/19/2012 FINAL   Final  GRAM STAIN     Status: None   Collection Time    09/23/12  6:46 PM      Result Value Range Status   Specimen Description WOUND BACK   Final   Special Requests PATIENT ON FOLLOWING KEFLEX   Final  Gram Stain     Final   Value: FEW WBC PRESENT,BOTH PMN AND MONONUCLEAR     NO ORGANISMS SEEN   Report Status 09/23/2012 FINAL   Final    Medical History: Past Medical History  Diagnosis Date  . Thyroid disease   . Hypertension   . Anxiety   . GERD (gastroesophageal reflux disease)   . DDD (degenerative disc disease), lumbar   . Facet arthritis of lumbar region   . S/P cardiac cath 9811,[id number]    Normal per report  . Status post placement of implantable loop recorder     Removed 2004  . Osteoporosis   . Diabetes mellitus 2013  . Fatty liver   . Complication of anesthesia     LONG TIME TO WAKE UP   . Pneumonia     2002  . Kidney stones   . H/O hiatal hernia   . Carpal tunnel syndrome   . Fibromyalgia   . Hypothyroidism   . Glaucoma     EARLY STAGES   Assessment:  s/p I&D and revision of lumbar wound.  Initial surgery 09/12/12, had Ancef peri-operatively, then started Cephalexin on 6/12, continued at discharge 6/13.  Vancomycin 1 gram IV given pre-op at 18:46. ESR and CRP pending. Also starting Rifampin 300 mg PO BID.  Goal of Therapy:  Vancomycin trough level 15-20 mcg/ml apppropriate Ceftriaxone dose for infection  Plan:   Ceftriaxone 1 gram IV q12hrs.  Vancomycin  1250 mg IV q12hrs. Begin at 6am on 6/21.  Bmet and CBC in am.  Follow-up labs, culture data and clinical status.  Dennie Fetters, [place] Pager: [phone] 09/23/2012,9:45 PM

## 2012-09-23 NOTE — Anesthesia Postprocedure Evaluation (Signed)
  Anesthesia Post-op Note  Patient: Yesenia [name]  Procedure(s) Performed: Procedure(s) with comments: LUMBAR WOUND DEBRIDEMENT (N/A) - Irrigation and Debridement of Lumbar Wound Infection  Patient Location: PACU and NICU  Anesthesia Type:General  Level of Consciousness: awake  Airway and Oxygen Therapy: Patient Spontanous Breathing  Post-op Pain: mild  Post-op Assessment: Post-op Vital signs reviewed  Post-op Vital Signs: stable  Complications: No apparent anesthesia complications

## 2012-09-23 NOTE — Op Note (Signed)
09/23/2012  7:17 PM  PATIENT:  Yesenia [name]  62 y.o. female  PRE-OPERATIVE DIAGNOSIS:  Postoperative lumbar wound infection  POST-OPERATIVE DIAGNOSIS:  Same  PROCEDURE:  Irrigation and debridement of lumbar wound with revision of lumbar wound  SURGEON:  Marikay Alar, MD  ASSISTANTS: None  ANESTHESIA:   General  EBL: Less than 100 ml  Total I/O In: 850 [I.V.:850] Out: -   BLOOD ADMINISTERED:none  DRAINS: Medium Hemovac  SPECIMEN:  No Specimen  INDICATION FOR PROCEDURE: This is a patient of Dr. Lovell Sheehan who underwent a lumbar fusion a couple of weeks ago who presents today with pain around her wound with redness and tenderness. Much of the wound had a black eschar and the wound edges were retracted. I recommended a lumbar wound revision and irrigation and debridement with culture of the wound. She was started on Keflex at home. Patient understood the risks, benefits, and alternatives and potential outcomes and wished to proceed.  PROCEDURE DETAILS: The patient was taken to the operating room and after induction of adequate generalized endotracheal anesthesia she was rolled into the prone position on the Wilson frame and all pressure points were padded. Her lumbar region was cleaned and then prepped with DuraPrep and draped in the usual sterile fashion. Her old incision and the eschar tissue was ellipsed out as well as the underlying subcutaneous tissues. I opened the fascia identified the hardware and inspected it. Did not find gross infection subfascially. I cultured both the superficial and the deep tissues. The hardware was well positioned and seemed to have good purchase. I then irrigated with saline solution containing bacitracin. I placed a medium Hemovac drain through separate stab incision. Then closed the fascia with 0 Vicryl. Closed the subcutaneous tissues in layers of 0 and 2-0 Vicryl. Closed the subcuticular tissue with 3-0 Vicryl. The skin was closed with benzoin  Steri-Strips. At the end of the procedure all sponge needle and instrument counts were correct. Patient was awakened from general anesthesia and transferred to the recover in stable condition. IV antibiotics were started after cultures were obtained.  PLAN OF CARE: Admit to inpatient   PATIENT DISPOSITION:  PACU - hemodynamically stable.   Delay start of Pharmacological VTE agent (>24hrs) due to surgical blood loss or risk of bleeding:  yes

## 2012-09-24 LAB — CBC
Hemoglobin: 9.4 g/dL — ABNORMAL LOW (ref 12.0–15.0)
MCH: 26.3 pg (ref 26.0–34.0)
MCHC: 31.2 g/dL (ref 30.0–36.0)

## 2012-09-24 LAB — C-REACTIVE PROTEIN: CRP: 4.6 mg/dL — ABNORMAL HIGH (ref <0.60)

## 2012-09-24 NOTE — Progress Notes (Signed)
Subjective: Patient reports she's feeling better less pain in her back no fevers  Objective: Vital signs in last 24 hours: Temp:  [97.5 F (36.4 C)-98.5 F (36.9 C)] 98.3 F (36.8 C) (06/21 0600) Pulse Rate:  [66-91] 86 (06/21 0600) Resp:  [14-27] 18 (06/21 0600) BP: (98-140)/(42-96) 98/42 mmHg (06/21 0600) SpO2:  [98 %-100 %] 99 % (06/21 0600) Weight:  [109.77 kg (242 lb)] 109.77 kg (242 lb) (06/20 1652)  Intake/Output from previous day: 06/20 0701 - 06/21 0700 In: 850 [I.V.:850] Out: 50 [Blood:50] Intake/Output this shift: Total I/O In: 850 [I.V.:850] Out: -   incision clean and dry strength appears to be 5 out of 5 in her lower  Lab Results: No results found for this basename: WBC, HGB, HCT, PLT,  in the last 72 hours BMET No results found for this basename: NA, K, CL, CO2, GLUCOSE, BUN, CREATININE, CALCIUM,  in the last 72 hours  Studies/Results: No results found.  Assessment/Plan: Posterior day 1 from an I&D of lumbar wound incision seems to be doing well continued IV antibiotics await cultures and mobilize with physical therapy   LOS: 1 day     Apryll Hinkle P 09/24/2012, 6:35 AM

## 2012-09-24 NOTE — Progress Notes (Signed)
Patient accedentally pulled out hemovac. No active bleeding noted this far. Will continue to monitor.

## 2012-09-24 NOTE — Progress Notes (Signed)
Physical Therapy Evaluation Patient Details Name: Yesenia [name] MRN: [id number] DOB: 08-11-1950 Today's Date: 09/24/2012 Time: 4098-1191 PT Time Calculation (min): 25 min  PT Assessment / Plan / Recommendation Clinical Impression  Pt is 62 yo female s/p lumbar I&D after L4-5 fusion 1 week ago. Pt has not declined significantly in functional status though appears generally weaker with decrease in activity tolerance. Pt ambulating with supervision with RW. Recommend that she ambulate in hall 3-5x/ day. Recommend continue with HHPT at d/c and PT will follow acutely to advance mobility, reinforce back precautions and increase independence.     PT Assessment  Patient needs continued PT services    Follow Up Recommendations  Home health PT;Supervision - Intermittent    Does the patient have the potential to tolerate intense rehabilitation      Barriers to Discharge Decreased caregiver support      Equipment Recommendations  None recommended by PT    Recommendations for Other Services     Frequency Min 3X/week    Precautions / Restrictions Precautions Precautions: Fall;Back Precaution Booklet Issued: No (received already) Precaution Comments: Pt recalled 3/3 precautions, but does not always keep with bed mobility, was reminded of proper mvmt patterns Required Braces or Orthoses: Spinal Brace Spinal Brace: Lumbar corset;Applied in sitting position Restrictions Weight Bearing Restrictions: No   Pertinent Vitals/Pain 8/10 back pain, premedicated      Mobility  Bed Mobility Bed Mobility: Rolling Left;Left Sidelying to Sit;Sit to Supine Rolling Left: 5: Supervision;With rail Left Sidelying to Sit: With rails;5: Supervision Sit to Supine: 5: Supervision;With rail;HOB elevated Details for Bed Mobility Assistance: vc's for keeping precautions and safe mvmt Transfers Transfers: Sit to Stand;Stand to Sit Sit to Stand: 6: Modified independent (Device/Increase time);From  toilet;From bed Stand to Sit: 6: Modified independent (Device/Increase time);To toilet;To bed Ambulation/Gait Ambulation/Gait Assistance: 5: Supervision Ambulation Distance (Feet): 100 Feet Assistive device: Rolling walker Ambulation/Gait Assistance Details: cues for erect posture and head over shoulders over hips Gait Pattern: Step-through pattern;Decreased stride length;Wide base of support Gait velocity: decreased Stairs: No Wheelchair Mobility Wheelchair Mobility: No    Exercises     PT Diagnosis: Difficulty walking;Generalized weakness;Acute pain  PT Problem List: Decreased strength;Decreased activity tolerance;Decreased mobility;Decreased knowledge of precautions;Decreased knowledge of use of DME;Pain PT Treatment Interventions: DME instruction;Gait training;Functional mobility training;Therapeutic activities;Therapeutic exercise;Patient/family education   PT Goals Acute Rehab PT Goals PT Goal Formulation: With patient Time For Goal Achievement: 10/01/12 Potential to Achieve Goals: Good Pt will Roll Supine to Left Side: with modified independence PT Goal: Rolling Supine to Left Side - Progress: Goal set today (keeping precautions) Pt will go Supine/Side to Sit: with modified independence (keeping precautions) PT Goal: Supine/Side to Sit - Progress: Goal set today Pt will go Sit to Supine/Side: with modified independence PT Goal: Sit to Supine/Side - Progress: Goal set today Pt will go Sit to Stand: Independently PT Goal: Sit to Stand - Progress: Goal set today Pt will go Stand to Sit: Independently PT Goal: Stand to Sit - Progress: Goal set today Pt will Ambulate: >150 feet;with modified independence;with least restrictive assistive device PT Goal: Ambulate - Progress: Goal set today Additional Goals Additional Goal #1: Patient will demonstrate and verbalize knowledge of 3/3 back precautions.  PT Goal: Additional Goal #1 - Progress: Goal set today  Visit Information   Last PT Received On: 09/24/12 Assistance Needed: +1    Subjective Data  Subjective: "I feel rough" Patient Stated Goal: return home   Prior Functioning  Home Living  Lives With: Alone Available Help at Discharge: Friend(s);Family;Available PRN/intermittently Type of Home: Independent living facility Home Access: Level entry;Elevator Home Layout: One level Bathroom Shower/Tub: Engineer, manufacturing systems: Standard Bathroom Accessibility: Yes How Accessible: Accessible via walker Home Adaptive Equipment: Shower chair without back;Walker - four wheeled;Walker - rolling;Grab bars around toilet Prior Function Level of Independence: Independent Driving: Yes (son drives her because she does not have car but is able) Vocation: Retired Musician: No difficulties Dominant Hand: Right    Cognition  Cognition Arousal/Alertness: Awake/alert Behavior During Therapy: WFL for tasks assessed/performed Overall Cognitive Status: Within Functional Limits for tasks assessed    Extremity/Trunk Assessment Right Upper Extremity Assessment RUE ROM/Strength/Tone: WFL for tasks assessed RUE Sensation: WFL - Light Touch;WFL - Proprioception RUE Coordination: WFL - gross motor Left Upper Extremity Assessment LUE ROM/Strength/Tone: WFL for tasks assessed LUE Sensation: WFL - Light Touch;WFL - Proprioception LUE Coordination: WFL - gross motor Right Lower Extremity Assessment RLE ROM/Strength/Tone: Deficits RLE ROM/Strength/Tone Deficits: generally weak, was beginning to feel stronger but since fever and increased back pain, has felt full body weakness RLE Sensation: WFL - Light Touch RLE Coordination: WFL - gross motor Left Lower Extremity Assessment LLE ROM/Strength/Tone: Deficits LLE ROM/Strength/Tone Deficits: generally weak LLE Sensation: WFL - Light Touch LLE Coordination: WFL - gross motor Trunk Assessment Trunk Assessment: Normal   Balance Balance Balance Assessed:  Yes Dynamic Standing Balance Dynamic Standing - Balance Support: Left upper extremity supported;During functional activity Dynamic Standing - Level of Assistance: 5: Stand by assistance  End of Session PT - End of Session Equipment Utilized During Treatment: Gait belt;Back brace Activity Tolerance: Patient tolerated treatment well Patient left: in bed;with call bell/phone within reach Nurse Communication: Mobility status  GP   Lyanne Co, PT  Acute Rehab Services  [phone]   Kathern, [place] 09/24/2012, 9:26 AM

## 2012-09-25 LAB — BASIC METABOLIC PANEL
BUN: 5 mg/dL — ABNORMAL LOW (ref 6–23)
CO2: 23 mEq/L (ref 19–32)
Chloride: 102 mEq/L (ref 96–112)
Creatinine, Ser: 0.73 mg/dL (ref 0.50–1.10)
GFR calc Af Amer: >90 mL/min (ref >90)

## 2012-09-25 LAB — WOUND CULTURE

## 2012-09-25 MED ORDER — BISACODYL 10 MG RE SUPP
10.0000 mg | Freq: Once | RECTAL | Status: AC
  Administered 2012-09-25: 10 mg via RECTAL
  Filled 2012-09-25: qty 1

## 2012-09-25 MED ORDER — SENNA 8.6 MG PO TABS: 1.0000 | ORAL_TABLET | Freq: Every day | ORAL | Status: DC | PRN

## 2012-09-25 MED ORDER — DIPHENHYDRAMINE HCL 25 MG PO CAPS
25.0000 mg | ORAL_CAPSULE | Freq: Four times a day (QID) | ORAL | Status: DC | PRN
  Administered 2012-09-25: 25 mg via ORAL
  Filled 2012-09-25: qty 1

## 2012-09-25 NOTE — Progress Notes (Signed)
Patient ID: Yesenia [name], female   DOB: [birth year], 62 y.o.   MRN: [id number] Subjective: Patient reports some back soreness but no leg pain. Has walked in the halls already today.  Objective: Vital signs in last 24 hours: Temp:  [98.2 F (36.8 C)-99.8 F (37.7 C)] 98.9 F (37.2 C) (06/22 1048) Pulse Rate:  [74-95] 78 (06/22 1048) Resp:  [18-20] 20 (06/22 1048) BP: (86-125)/(42-79) 86/54 mmHg (06/22 1048) SpO2:  [90 %-99 %] 96 % (06/22 1048)  Intake/Output from previous day: 06/21 0701 - 06/22 0700 In: 580 [P.O.:580] Out: -  Intake/Output this shift: Total I/O In: 240 [P.O.:240] Out: -   Neurologic: Grossly normal Incision with some erythema and some developing black eschar. No drainage.  Lab Results: Lab Results  Component Value Date   WBC 6.4 09/24/2012   HGB 9.4* 09/24/2012   HCT 30.1* 09/24/2012   MCV 84.3 09/24/2012   PLT 364 09/24/2012   No results found for this basename: INR, PROTIME   BMET Lab Results  Component Value Date   NA 137 09/25/2012   K 3.8 09/25/2012   CL 102 09/25/2012   CO2 23 09/25/2012   GLUCOSE 116* 09/25/2012   BUN 5* 09/25/2012   CREATININE 0.73 09/25/2012   CALCIUM 8.8 09/25/2012    Studies/Results: No results found.  Assessment/Plan: Cultures negative to date. This may simply be related to poor wound healing. May need wound care consult. Continue Antibiotics for now and Dr. Lovell Sheehan will take over tomorrow   LOS: 2 days    Yesenia [name] 09/25/2012, 11:05 AM

## 2012-09-25 NOTE — Plan of Care (Signed)
Current iv removed. Slight redness noted and pt states site hurts. Iv team called to come and restart new iv.

## 2012-09-25 NOTE — Progress Notes (Signed)
Occupational Therapy Evaluation Patient Details Name: Yesenia [name] MRN: [id number] DOB: Sep 07, 1950 Today's Date: 09/25/2012 Time: 0950-1004 OT Time Calculation (min): 14 min  OT Assessment / Plan / Recommendation Clinical Impression  Patient presents to OT s/p I and D of back wound but has been functioning at home alone after back surgery about 2 weeks ago. Reviewed ADL techniques with back precautions. Pt functioning at mod I-S level with ADLs at this time.    OT Assessment  Patient does not need any further OT services    Follow Up Recommendations  Supervision - Intermittent    Barriers to Discharge      Equipment Recommendations  None recommended by OT    Recommendations for Other Services    Frequency       Precautions / Restrictions Precautions Precautions: Fall;Back Precaution Comments: Pt recalled 3/3 back precautions Required Braces or Orthoses: Spinal Brace Spinal Brace: Lumbar corset;Applied in sitting position Restrictions Weight Bearing Restrictions: No   Pertinent Vitals/Pain     ADL  Grooming: Performed;Wash/dry hands;Supervision/safety Where Assessed - Grooming: Unsupported standing Lower Body Bathing: Simulated;Modified independent Where Assessed - Lower Body Bathing: Unsupported sit to stand Lower Body Dressing: Performed;Modified independent Where Assessed - Lower Body Dressing: Unsupported sit to stand Toilet Transfer: Performed;Modified independent Toilet Transfer Method: Sit to Barista: Raised toilet seat with arms (or 3-in-1 over toilet) Toileting - Clothing Manipulation and Hygiene: Simulated;Modified independent Where Assessed - Toileting Clothing Manipulation and Hygiene: Standing Transfers/Ambulation Related to ADLs: S-mod I with transfers and amb with RW in room ADL Comments: Pt put on socks laying in bed but bringing foot up to knee level. Says that when she is at home she uses her pant leg to help her get to  her feet for donning undergarments, or pants and she does not wear socks at home. Pt performed supine instead of EOB for pain management    OT Diagnosis:    OT Problem List:   OT Treatment Interventions:     OT Goals    Visit Information  Last OT Received On: 09/25/12 Assistance Needed: +1    Subjective Data      Prior Functioning     Home Living Lives With: Alone Available Help at Discharge: Friend(s);Family;Available PRN/intermittently Type of Home: Independent living facility Home Access: Level entry;Elevator Home Layout: One level Bathroom Shower/Tub: Engineer, manufacturing systems: Standard Bathroom Accessibility: Yes How Accessible: Accessible via walker Home Adaptive Equipment: Shower chair without back;Walker - four wheeled;Walker - rolling;Grab bars around toilet Additional Comments: grab bars in shower and next to toilet Prior Function Level of Independence: Independent Driving: Yes Vocation: Retired Musician: No difficulties Dominant Hand: Right         Vision/Perception Vision - History Baseline Vision: Wears glasses all the time Patient Visual Report: No change from baseline   Cognition  Cognition Arousal/Alertness: Awake/alert Behavior During Therapy: WFL for tasks assessed/performed Overall Cognitive Status: Within Functional Limits for tasks assessed    Extremity/Trunk Assessment Right Upper Extremity Assessment RUE ROM/Strength/Tone: [facility] for tasks assessed Left Upper Extremity Assessment LUE ROM/Strength/Tone: WFL for tasks assessed     Mobility       Exercise     Balance     End of Session OT - End of Session Equipment Utilized During Treatment: Back brace Activity Tolerance: Patient tolerated treatment well Patient left: in bed;with call bell/phone within reach  GO     Yesenia [name] 09/25/2012, 1:23 PM

## 2012-09-25 NOTE — Progress Notes (Signed)
[facility]  Patient Status: Active (receiving services up to time of hospitalization)  [facility] is providing the following services: RN and PT  If patient discharges after hours, please call [phone].   Yesenia [name] 09/25/2012, 7:57 PM

## 2012-09-25 NOTE — Progress Notes (Signed)
Physical Therapy Treatment Patient Details Name: JENNAH SATCHELL MRN: [id number] DOB: [birth year] Today's Date: 09/25/2012 Time: 9562-1308 PT Time Calculation (min): 21 min  PT Assessment / Plan / Recommendation Comments on Treatment Session  Pt overall moves fairly well but requires cueing to reinforce back precautions with bed mobility.      Follow Up Recommendations  Home health PT;Supervision - Intermittent     Does the patient have the potential to tolerate intense rehabilitation     Barriers to Discharge        Equipment Recommendations  None recommended by PT    Recommendations for Other Services    Frequency Min 3X/week   Plan Discharge plan remains appropriate;Frequency remains appropriate    Precautions / Restrictions Precautions Precautions: Fall;Back Precaution Comments: Pt recalled 3/3 precautions, but does not always keep with bed mobility, was reminded of proper mvmt patterns Required Braces or Orthoses: Spinal Brace Spinal Brace: Lumbar corset;Applied in sitting position Restrictions Weight Bearing Restrictions: No       Mobility  Bed Mobility Bed Mobility: Rolling Right;Right Sidelying to Sit;Sitting - Scoot to Edge of Bed;Sit to Sidelying Right Rolling Right: 5: Supervision Right Sidelying to Sit: 5: Supervision;HOB flat;With rails Sitting - Scoot to Edge of Bed: 6: Modified independent (Device/Increase time) Sit to Sidelying Right: 5: Supervision;HOB flat Details for Bed Mobility Assistance: Cues to reinforce logrolling technique & back precautions.  Pt has tendency to twisting with movements.   Transfers Transfers: Sit to Stand;Stand to Sit Sit to Stand: 6: Modified independent (Device/Increase time);From bed;From toilet;With upper extremity assist Stand to Sit: 6: Modified independent (Device/Increase time);With upper extremity assist;To bed;To toilet Ambulation/Gait Ambulation/Gait Assistance: 5: Supervision Ambulation Distance (Feet): 500  Feet Assistive device: Rolling walker Ambulation/Gait Assistance Details: Cues to reinforce "no twisting"  Gait Pattern: Step-through pattern;Decreased stride length Gait velocity: decreased Stairs: No Wheelchair Mobility Wheelchair Mobility: No     PT Goals Acute Rehab PT Goals Time For Goal Achievement: 10/01/12 Potential to Achieve Goals: Good Pt will Roll Supine to Left Side: with modified independence Pt will go Supine/Side to Sit: with modified independence PT Goal: Supine/Side to Sit - Progress: Progressing toward goal Pt will go Sit to Supine/Side: with modified independence PT Goal: Sit to Supine/Side - Progress: Progressing toward goal Pt will go Sit to Stand: Independently PT Goal: Sit to Stand - Progress: Progressing toward goal Pt will go Stand to Sit: Independently PT Goal: Stand to Sit - Progress: Progressing toward goal Pt will Ambulate: >150 feet;with modified independence;with least restrictive assistive device PT Goal: Ambulate - Progress: Progressing toward goal Additional Goals Additional Goal #1: Patient will demonstrate and verbalize knowledge of 3/3 back precautions.  PT Goal: Additional Goal #1 - Progress: Progressing toward goal  Visit Information  Last PT Received On: 09/25/12 Assistance Needed: +1    Subjective Data      Cognition  Cognition Arousal/Alertness: Awake/alert Behavior During Therapy: WFL for tasks assessed/performed Overall Cognitive Status: Within Functional Limits for tasks assessed    Balance     End of Session PT - End of Session Equipment Utilized During Treatment: Back brace Activity Tolerance: Patient tolerated treatment well Patient left: in bed;with call bell/phone within reach Nurse Communication: Mobility status     Verdell Face, [place] [phone] 09/25/2012

## 2012-09-26 MED ORDER — NYSTATIN 100000 UNIT/GM EX POWD
Freq: Three times a day (TID) | CUTANEOUS | Status: DC | PRN
  Administered 2012-09-26: 22:00:00 via TOPICAL
  Filled 2012-09-26: qty 15

## 2012-09-26 MED ORDER — SODIUM CHLORIDE 0.9 % IJ SOLN
10.0000 mL | INTRAMUSCULAR | Status: DC | PRN
  Administered 2012-09-26 – 2012-09-27 (×2): 10 mL

## 2012-09-26 MED ORDER — DEXTROSE 5 % IV SOLN
2.0000 g | INTRAVENOUS | Status: DC
  Filled 2012-09-26: qty 2

## 2012-09-26 NOTE — Progress Notes (Signed)
UR COMPLETED  

## 2012-09-26 NOTE — Progress Notes (Signed)
Patient ID: Yesenia [name], female   DOB: 01/11/1951, 62 y.o.   MRN: [id number] Subjective:  The patient is alert and pleasant.  Objective: Vital signs in last 24 hours: Temp:  [98 F (36.7 C)-99.5 F (37.5 C)] 98 F (36.7 C) (06/23 1100) Pulse Rate:  [67-88] 86 (06/23 1100) Resp:  [17-20] 20 (06/23 1100) BP: (92-118)/(39-72) 115/59 mmHg (06/23 1100) SpO2:  [90 %-97 %] 96 % (06/23 1100)  Intake/Output from previous day: 06/22 0701 - 06/23 0700 In: 720 [P.O.:720] Out: -  Intake/Output this shift: Total I/O In: 840 [P.O.:840] Out: -   Physical exam the patient is alert and oriented. Her strength is normal in her lower extremities. Her wound is erythrematous without discharge.  Lab Results:  Recent Labs  09/24/12 0625  WBC 6.4  HGB 9.4*  HCT 30.1*  PLT 364   BMET  Recent Labs  09/25/12 0618  NA 137  K 3.8  CL 102  CO2 23  GLUCOSE 116*  BUN 5*  CREATININE 0.73  CALCIUM 8.8    Studies/Results: No results found.  Assessment/Plan: Wound infection: I discussed the situation with the patient. Her cultures are negative, heart sedimentation rate is elevated, her white count is normal. I recommend the patient be treated empirically for a wound infection with antibiotics. We will have a PICC line placed. I will asked ID to see the patient to make recommendations regarding which antibiotics and for how long.  LOS: 3 days     Elisa Kutner D 09/26/2012, 12:58 PM

## 2012-09-26 NOTE — Progress Notes (Signed)
ANTIBIOTIC CONSULT NOTE - FOLLOW UP  Pharmacy Consult for vancomycin Indication: lumbar wound  Allergies  Allergen Reactions  . Morphine Anaphylaxis and Other (See Comments)    Caused patient to CODE .  [name] Aspirin Other (See Comments)    G.I. Upset   . Codeine Itching, Swelling and Rash  . Nalbuphine Itching, Swelling and Rash    Patient Measurements: Height: 5\' 1"  (154.9 cm) Weight: 242 lb (109.77 kg) IBW/kg (Calculated) : 47.8 Adjusted Body Weight:   Vital Signs: Temp: 99.4 F (37.4 C) (06/23 1630) Temp src: Oral (06/23 1630) BP: 100/53 mmHg (06/23 1630) Pulse Rate: 81 (06/23 1630) Intake/Output from previous day: 06/22 0701 - 06/23 0700 In: 720 [P.O.:720] Out: -  Intake/Output from this shift: Total I/O In: 840 [P.O.:840] Out: -   Labs:  Recent Labs  09/24/12 0625 09/25/12 0618  WBC 6.4  --   HGB 9.4*  --   PLT 364  --   CREATININE  --  0.73   Estimated Creatinine Clearance: 84.6 ml/min (by C-G formula based on Cr of 0.73). No results found for this basename: VANCOTROUGH, Leodis Binet, VANCORANDOM, GENTTROUGH, GENTPEAK, GENTRANDOM, TOBRATROUGH, TOBRAPEAK, TOBRARND, AMIKACINPEAK, AMIKACINTROU, AMIKACIN,  in the last 72 hours   Microbiology: Recent Results (from the past 720 hour(s))  SURGICAL PCR SCREEN     Status: Abnormal   Collection Time    09/05/12 11:13 AM      Result Value Range Status   MRSA, PCR POSITIVE (*) NEGATIVE Final   Staphylococcus aureus POSITIVE (*) NEGATIVE Final   Comment:            The Xpert SA Assay (FDA     approved for NASAL specimens     in patients over 24 years of age),     is one component of     a comprehensive surveillance     program.  Test performance has     been validated by [employer] for patients greater     than or equal to 75 year old.     It is not intended     to diagnose infection nor to     guide or monitor treatment.  URINE CULTURE     Status: None   Collection Time    09/13/12  8:42 AM   Result Value Range Status   Specimen Description URINE, CATHETERIZED   Final   Special Requests Normal   Final   Culture  Setup Time 09/13/2012 10:08   Final   Colony Count NO GROWTH   Final   Culture NO GROWTH   Final   Report Status 09/14/2012 FINAL   Final  CULTURE, BLOOD (ROUTINE X 2)     Status: None   Collection Time    09/13/12 10:10 AM      Result Value Range Status   Specimen Description BLOOD RIGHT ARM   Final   Special Requests BOTTLES DRAWN AEROBIC AND ANAEROBIC 10CC   Final   Culture  Setup Time 09/13/2012 23:50   Final   Culture NO GROWTH 5 DAYS   Final   Report Status 09/19/2012 FINAL   Final  CULTURE, BLOOD (ROUTINE X 2)     Status: None   Collection Time    09/13/12 10:15 AM      Result Value Range Status   Specimen Description BLOOD RIGHT ARM   Final   Special Requests BOTTLES DRAWN AEROBIC ONLY 2CC   Final   Culture  Setup  Time 09/13/2012 23:49   Final   Culture NO GROWTH 5 DAYS   Final   Report Status 09/19/2012 FINAL   Final  ANAEROBIC CULTURE     Status: None   Collection Time    09/23/12  6:46 PM      Result Value Range Status   Specimen Description WOUND BACK   Final   Special Requests PATIENT ON FOLLOWING KEFLEX   Final   Gram Stain     Final   Value: FEW WBC PRESENT,BOTH PMN AND MONONUCLEAR     NO ORGANISMS SEEN     Performed at [facility]   Culture     Final   Value: NO ANAEROBES ISOLATED; CULTURE IN PROGRESS FOR 5 DAYS   Report Status PENDING   Incomplete  WOUND CULTURE     Status: None   Collection Time    09/23/12  6:46 PM      Result Value Range Status   Specimen Description WOUND BACK   Final   Special Requests PATIENT ON FOLLOWING KEFLEX   Final   Gram Stain     Final   Value: FEW WBC PRESENT,BOTH PMN AND MONONUCLEAR     NO SQUAMOUS EPITHELIAL CELLS SEEN     NO ORGANISMS SEEN   Culture NO GROWTH 2 DAYS   Final   Report Status 09/25/2012 FINAL   Final  GRAM STAIN     Status: None   Collection Time    09/23/12  6:46 PM       Result Value Range Status   Specimen Description WOUND BACK   Final   Special Requests PATIENT ON FOLLOWING KEFLEX   Final   Gram Stain     Final   Value: FEW WBC PRESENT,BOTH PMN AND MONONUCLEAR     NO ORGANISMS SEEN   Report Status 09/23/2012 FINAL   Final    Anti-infectives   Start     Dose/Rate Route Frequency Ordered Stop   09/27/12 2200  cefTRIAXone (ROCEPHIN) 2 g in dextrose 5 % 50 mL IVPB     2 g 100 mL/hr over 30 Minutes Intravenous Every 24 hours 09/26/12 1440     09/24/12 0600  vancomycin (VANCOCIN) 1,250 mg in sodium chloride 0.9 % 250 mL IVPB     1,250 mg 166.7 mL/hr over 90 Minutes Intravenous Every 12 hours 09/23/12 2139     09/23/12 2200  rifampin (RIFADIN) capsule 300 mg     300 mg Oral Every 12 hours 09/23/12 2114     09/23/12 2200  cefTRIAXone (ROCEPHIN) 1 g in dextrose 5 % 50 mL IVPB  Status:  Discontinued     1 g 100 mL/hr over 30 Minutes Intravenous Every 12 hours 09/23/12 2141 09/26/12 1440   09/23/12 1840  bacitracin 50,000 Units in sodium chloride irrigation 0.9 % 500 mL irrigation  Status:  Discontinued       As needed 09/23/12 1846 09/23/12 1917   09/23/12 1815  cefTRIAXone (ROCEPHIN) 1 g in dextrose 5 % 50 mL IVPB  Status:  Discontinued     1 g 100 mL/hr over 30 Minutes Intravenous To Neuro OR-Station #32 09/23/12 1805 09/23/12 2114   09/23/12 1808  vancomycin (VANCOCIN) 1 GM/200ML IVPB    Comments:  BERRY, TABATHA: cabinet override      09/23/12 1808 09/23/12 1846      Assessment: 62 yo female with lumbar wound is currently on therapeutic vancomycin.  Vancomycin trough was 17.3. SCr was 0.73 on 06/22.  Goal of Therapy:  Vancomycin trough level 15-20 mcg/ml  Plan:  1) Continue vancomycin 1250mg  iv q12h 2) Monitor renal function and recheck vancomycin trough in 1 week if still on then.  Jamey Harman, [place] 09/26/2012,5:35 PM

## 2012-09-26 NOTE — Consult Note (Signed)
Please see attending note's recommendation

## 2012-09-26 NOTE — Progress Notes (Signed)
Peripherally Inserted Central Catheter/Midline Placement  The IV Nurse has discussed with the patient and/or persons authorized to consent for the patient, the purpose of this procedure and the potential benefits and risks involved with this procedure.  The benefits include less needle sticks, lab draws from the catheter and patient may be discharged home with the catheter.  Risks include, but not limited to, infection, bleeding, blood clot (thrombus formation), and puncture of an artery; nerve damage and irregular heat beat.  Alternatives to this procedure were also discussed.  PICC/Midline Placement Documentation        Timmothy Sours 09/26/2012, 3:56 PM

## 2012-09-26 NOTE — Progress Notes (Signed)
Pt reports not having a BM in 2 weeks (6/8, specifically).  MD notified, pt given dulcolax suppository.  Pt had BM shortly after.

## 2012-09-26 NOTE — Care Management Note (Signed)
    Page 1 of 1   09/27/2012     1:48:02 PM   CARE MANAGEMENT NOTE 09/27/2012  Patient:  Yesenia [name], Yesenia [name]   Account Number:  [id number]  Date Initiated:  09/26/2012  Documentation initiated by:  Elmer Bales  Subjective/Objective Assessment:   Pt admitted for post-op wound infection     Action/Plan:   Will follow for discharge needs   Anticipated DC Date:  09/26/2012   Anticipated DC Plan:  HOME W HOME HEALTH SERVICES      DC Planning Services  CM consult      Choice offered to / List presented to:  C-1 Patient        HH arranged  HH-1 RN  HH-2 PT  HH-3 OT      [facility] agency  [facility]   Status of service:  Completed, signed off Medicare Important Message given?   (If response is "NO", the following Medicare IM given date fields will be blank) Date Medicare IM given:   Date Additional Medicare IM given:    Discharge Disposition:  HOME W HOME HEALTH SERVICES  Per UR Regulation:  Reviewed for med. necessity/level of care/duration of stay  If discussed at Long Length of Stay Meetings, dates discussed:    Comments:  09/27/12 1020 Elmer Bales RN, MSN CM-  Spoke with Corrie Dandy from [facility] regarding HH needs.  Pt is currently active with [facility]. Pt's HH PT/OT/RN for home antibiotics is set up with [facility], first visit set up for tonight.  09/26/12 1215 Elmer Bales RN, MSN, CM- Met with patient per her request.  Pt desires information on advanced directives.  Packet was provided.  Pt informed to let staff know when she is ready for witnesses.

## 2012-09-26 NOTE — Progress Notes (Signed)
Physical Therapy Treatment Patient Details Name: Yesenia [name] MRN: [id number] DOB: 10-26-1950 Today's Date: 09/26/2012 Time: 7829-5621 PT Time Calculation (min): 16 min  PT Assessment / Plan / Recommendation Comments on Treatment Session  Pt overall moves fairly well but requires cueing to reinforce back precautions with bed mobility.      Follow Up Recommendations  Home health PT;Supervision - Intermittent     Does the patient have the potential to tolerate intense rehabilitation     Barriers to Discharge        Equipment Recommendations  None recommended by PT    Recommendations for Other Services    Frequency Min 3X/week   Plan Discharge plan remains appropriate;Frequency remains appropriate    Precautions / Restrictions Precautions Precautions: Fall;Back Precaution Comments: Pt recalled 3/3 back precautions Required Braces or Orthoses: Spinal Brace Spinal Brace: Lumbar corset;Applied in sitting position Restrictions Weight Bearing Restrictions: No   Pertinent Vitals/Pain Pt. Reports 8/10 pain, RN in room to give pain medication during session.    Mobility  Bed Mobility Bed Mobility: Rolling Left;Left Sidelying to Sit Rolling Left: 6: Modified independent (Device/Increase time);With rail Left Sidelying to Sit: 6: Modified independent (Device/Increase time);With rails;HOB elevated (45 degrees) Sitting - Scoot to Edge of Bed: 6: Modified independent (Device/Increase time) Details for Bed Mobility Assistance: demonstrated safe log roll technique, however HOB elevated and pt. unwilling to wait 2/2 needing to use restroom Transfers Transfers: Sit to Stand;Stand to Sit Sit to Stand: 6: Modified independent (Device/Increase time);From bed;From toilet;With upper extremity assist Stand to Sit: 6: Modified independent (Device/Increase time);To chair/3-in-1;To toilet;With upper extremity assist Ambulation/Gait Ambulation/Gait Assistance: 5: Supervision Ambulation  Distance (Feet): 200 Feet Assistive device: None;Other (Comment) (pt. pushed IV pole) Gait Pattern: Within Functional Limits Gait velocity: decreased General Gait Details: slow and cautious, decreased trunk rotation Stairs: No    Exercises     PT Diagnosis:    PT Problem List:   PT Treatment Interventions:     PT Goals Acute Rehab PT Goals PT Goal Formulation: With patient Potential to Achieve Goals: Good PT Goal: Rolling Supine to Left Side - Progress: Progressing toward goal PT Goal: Supine/Side to Sit - Progress: Progressing toward goal PT Goal: Sit to Stand - Progress: Progressing toward goal PT Goal: Stand to Sit - Progress: Progressing toward goal PT Goal: Ambulate - Progress: Progressing toward goal Additional Goals PT Goal: Additional Goal #1 - Progress: Progressing toward goal  Visit Information  Last PT Received On: 09/26/12 Assistance Needed: +1    Subjective Data  Subjective: "I can't wait, I have to go." needing to use restroom Patient Stated Goal: return home   Cognition  Cognition Arousal/Alertness: Awake/alert Behavior During Therapy: WFL for tasks assessed/performed Overall Cognitive Status: Within Functional Limits for tasks assessed    Balance  Balance Balance Assessed: Yes Static Standing Balance Static Standing - Balance Support: During functional activity;No upper extremity supported Static Standing - Level of Assistance: 5: Stand by assistance  End of Session PT - End of Session Equipment Utilized During Treatment: Back brace;Gait belt Activity Tolerance: Patient tolerated treatment well Patient left: in chair;with call bell/phone within reach;with chair alarm set;with nursing in room Nurse Communication: Mobility status   GP     Feltis, Nicki Reaper 09/26/2012, 9:01 AM Nicki Reaper. Feltis, PT, DPT [phone]

## 2012-09-26 NOTE — Consult Note (Signed)
[facility]  Total days of antibiotics 4         Day 4 ceftriaxone        Day 4 vancomycin        Day 4 rifampin       Reason for Consult: post op wound infection    Referring Physician: jenkins  Active Problems:   * No active hospital problems. *    HPI: Yesenia [name] is a 62 y.o. female withL4-5 spondylolisthesis, degenerative disc disease, spinal stenosis, lumbar radiculopathy and neurogenic claudication who underwent  Lumbar Posterior Lumbar Fusion surgery with instrumentation on 06/09. It was noted on POD#3 that she had some mild incisional erythema that may have coincided with an isolated fever. She was discharged home with Cefalexin QID up until this hospitalization. After the surgery, she had constant steady pain in her back, localized to surgical site, aggravated with lying on her back and relieved with certain position. She considered it as the normal incision pain. On 06/20, she felt acute onset of extreme fatigued, temperature variation with chills/hot flashes but no overt fevers. She also noticed a black debris on her sheets for which she called home health RN to evaluate which was communicated to neurosurgery and the patient was admitted on the same day based on the severity of wound. An irrigation and debridement was performed on 06/20 and she was started on Ceftriaxone, Rifampin and Vancomycin. OR note states that no signs of infection in subfascial or fascial layer.   Past Medical History  Diagnosis Date  . Thyroid disease   . Hypertension   . Anxiety   . GERD (gastroesophageal reflux disease)   . DDD (degenerative disc disease), lumbar   . Facet arthritis of lumbar region   . S/P cardiac cath [id number]    Normal per report  . Status post placement of implantable loop recorder     Removed 2004  . Osteoporosis   . Diabetes mellitus 2013  . Fatty liver   . Complication of anesthesia     LONG TIME TO WAKE UP   . Pneumonia     2002    . Kidney stones   . H/O hiatal hernia   . Carpal tunnel syndrome   . Fibromyalgia   . Hypothyroidism   . Glaucoma     EARLY STAGES    Allergies:  Allergies  Allergen Reactions  . Morphine Anaphylaxis and Other (See Comments)    Caused patient to CODE .  [name] Aspirin Other (See Comments)    G.I. Upset   . Codeine Itching, Swelling and Rash  . Nalbuphine Itching, Swelling and Rash   MEDICATIONS: . [START ON 09/27/2012] cefTRIAXone (ROCEPHIN)  IV  2 g Intravenous Q24H  . docusate sodium  100 mg Oral BID  . irbesartan  150 mg Oral Daily   And  . hydrochlorothiazide  25 mg Oral Daily  . levothyroxine  75 mcg Oral QAC breakfast  . pantoprazole  40 mg Oral Daily  . pregabalin  100 mg Oral TID  . rifampin  300 mg Oral Q12H  . sodium chloride  3 mL Intravenous Q12H  . traZODone  100 mg Oral QHS  . vancomycin  1,250 mg Intravenous Q12H    History  Substance Use Topics  . Smoking status: Never Smoker   . Smokeless tobacco: Never Used  . Alcohol Use: No    Family History  Problem Relation Age of Onset  . Hypertension Mother   .  Depression Mother   . Vision loss Mother   . Osteoporosis Mother   . Colon cancer Mother   . Early death Brother     74  . Cancer Brother     colon  . Lung cancer Paternal Emelia Loron     was a smoker  . Asthma Grandchild     R Review of Systems  Constitutional: Negative for fever, chills, diaphoresis, activity change, appetite change, fatigue and unexpected weight change.  HENT: Negative for congestion, sore throat, rhinorrhea, sneezing, trouble swallowing and sinus pressure.  Eyes: Negative for photophobia and visual disturbance.  Respiratory: Negative for cough, chest tightness, shortness of breath, wheezing and stridor.  Cardiovascular: Negative for chest pain, palpitations and leg swelling.  Gastrointestinal: Negative for nausea, vomiting, abdominal pain, diarrhea, constipation, blood in stool, abdominal distention and anal bleeding.   Genitourinary: Negative for dysuria, hematuria, flank pain and difficulty urinating.  Musculoskeletal: Negative for myalgias, back pain, joint swelling, arthralgias and gait problem.  Skin: Negative for color change, pallor, rash and wound.  Neurological: Negative for dizziness, tremors, weakness and light-headedness.  Hematological: Negative for adenopathy. Does not bruise/bleed easily.  Psychiatric/Behavioral: Negative for behavioral problems, confusion, sleep disturbance, dysphoric mood, decreased concentration and agitation.     OBJECTIVE: Temp:  [98 F (36.7 C)-99.5 F (37.5 C)] 98.2 F (36.8 C) (06/23 1846) Pulse Rate:  [67-86] 80 (06/23 1846) Resp:  [17-20] 20 (06/23 1846) BP: (92-115)/(39-59) 102/47 mmHg (06/23 1846) SpO2:  [90 %-97 %] 97 % (06/23 1846)  Physical Exam  Constitutional:  oriented to person, place, and time. appears well-developed and well-nourished. No distress.  HENT:  Mouth/Throat: Oropharynx is clear and moist. No oropharyngeal exudate.  Cardiovascular: Normal rate, regular rhythm and normal heart sounds. Exam reveals no gallop and no friction rub.  No murmur heard.  Pulmonary/Chest: Effort normal and breath sounds normal. No respiratory distress.  no wheezes.  Abdominal: Soft. Bowel sounds are normal.  exhibits no distension. There is no tenderness.  Lymphadenopathy:  no cervical adenopathy.  Back: mild surrounding erythema to surgical incision, no purulent drainage. No swelling in area. Neurological: He is alert and oriented to person, place, and time.  Skin: Skin is warm and dry. No rash noted. No erythema.  Psychiatric: He has a normal mood and affect. His behavior is normal.    LABS: Results for orders placed during the hospital encounter of 09/23/12 (from the past 48 hour(s))  BASIC METABOLIC PANEL     Status: Abnormal   Collection Time    09/25/12  6:18 AM      Result Value Range   Sodium 137  135 - 145 mEq/L   Potassium 3.8  3.5 - 5.1 mEq/L    Chloride 102  96 - 112 mEq/L   CO2 23  19 - 32 mEq/L   Glucose, Bld 116 (*) 70 - 99 mg/dL   BUN 5 (*) 6 - 23 mg/dL   Creatinine, Ser 1.61  0.50 - 1.10 mg/dL   Calcium 8.8  8.4 - 09.6 mg/dL   GFR calc non Af Amer >90  >90 mL/min   GFR calc Af Amer >90  >90 mL/min   Comment:            The eGFR has been calculated     using the CKD EPI equation.     This calculation has not been     validated in all clinical     situations.     eGFR's persistently     <  90 mL/min signify     possible Chronic Kidney Disease.  VANCOMYCIN, TROUGH     Status: None   Collection Time    09/26/12  4:30 PM      Result Value Range   Vancomycin Tr 17.3  10.0 - 20.0 ug/mL    MICRO: 6/20 wound cx: few wbc and no organism. No growth to date IMAGING: No results found.  Assessment/Plan:  63yo F with recent lumbar PLIF with instrumentation placed on kelfex post-operatively but had prodrome of 1 day fatigue, chills in setting of poor wound healing concerning for superficial wound infection currently on vanco, cefriaxone, and rifampin.  - recommend to treat as superficial wound infection of 2 wks of IV antibiotics.  - cultures will likely remain negative since she was previously on cephalexin - recommend to treat with vancomycin (goal trough of 15-20) and ceftriaxone 2gm IV daily - no need for rifampin since not thought to be a deep infection, to involve HW. - we will see her back in ID clinic in 2 wks to decide to transition to oral antibiotics or continue on IV antibiotics depending on how she is healing.  Duke Salvia Drue Second MD MPH [facility] [phone]
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
## 2012-09-27 MED ORDER — DEXTROSE 5 % IV SOLN: 2.0000 g | INTRAVENOUS | Status: DC

## 2012-09-27 MED ORDER — HEPARIN SOD (PORK) LOCK FLUSH 100 UNIT/ML IV SOLN
250.0000 [IU] | INTRAVENOUS | Status: AC | PRN
  Administered 2012-09-27: 250 [IU]

## 2012-09-27 MED ORDER — VANCOMYCIN HCL 10 G IV SOLR: 1250.0000 mg | Freq: Two times a day (BID) | INTRAVENOUS | Status: DC

## 2012-09-27 NOTE — Progress Notes (Signed)
Discharge order received. Pt set up with [facility] for P[facility] IV abx. Her incision is slightly red, edges approximated, no drainage MD aware. Medications and discharge instructions reviewed with pt and family. To be discharged home with son with HHPT/ HHRN.

## 2012-09-27 NOTE — Progress Notes (Signed)
[name] is a 62 yo female with 09/12/12   Posterior Lumbar Fusion surgery 09/23/12   Admitted to [facility] due to wound infection 09/23/12   Irrigation and debridement  Subjective: She is feeling better today. She has decreased itching and irritation in the wound but the pain is about the same. The wound looks better today on examination too. She had no fever/chills. She still has fatigue. Her PICC line was placed yesterday and she is ready for discharge today or tomorrow. She was concerned about the contact precautions wether her infection is contagious and I explained the reason for contact precautions and answered her queries.  Objective: Vital signs in last 24 hours: Filed Vitals:   09/26/12 1846 09/26/12 2200 09/27/12 0200 09/27/12 0600  BP: 102/47 121/69 130/60 131/55  Pulse: 80 87 74 79  Temp: 98.2 F (36.8 C) 98.1 F (36.7 C) 98 F (36.7 C) 97.7 F (36.5 C)  TempSrc: Oral Oral Oral Oral  Resp: 20 20 20 20   Height:      Weight:      SpO2: 97% 98% 100% 98%   Weight change:   Intake/Output Summary (Last 24 hours) at 09/27/12 0818 Last data filed at 09/26/12 2140  Gross per 24 hour  Intake    960 ml  Output      0 ml  Net    960 ml   Physical Exam:  Blood pressure 100/53, pulse 81, temperature 99.4 F (37.4 C), temperature source Oral, resp. rate 20, height 5\' 1"  (1.549 m), weight 109.77 kg (242 lb), SpO2 95.00%.  Constitutional: Vital signs reviewed. Patient is in no acute distress and cooperative with exam. Alert and oriented x3.  Head: Normocephalic and atraumatic  Ear: TM normal bilaterally  Nose: No erythema or drainage noted. Turbinates normal  Mouth: no erythema or exudates, MMM  Eyes: PERRL, EOMI, conjunctivae normal, No scleral icterus.  Neck: Supple, Trachea midline normal ROM, No JVD, mass, thyromegaly, or carotid bruit present.  Cardiovascular: RRR, S1 normal, S2 normal, no MRG, pulses symmetric and intact bilaterally  Pulmonary/Chest: normal  respiratory effort, CTAB, no wheezes, rales, or rhonchi  Abdominal: Soft. Non-tender, non-distended, bowel sounds are normal, no masses, organomegaly, or guarding present.  Neurological: A&O x3, Strength is normal and symmetric bilaterally, cranial nerve II-XII are grossly intact, no focal motor deficit, sensory intact to light touch bilaterally.  Back: She had a large incision wound on her back in the midline that had pink margins and no discharge, the temperature was normal and there was mild tenderness around the wound.  Lab Results: Basic Metabolic Panel:  Recent Labs Lab 09/25/12 0618  NA 137  K 3.8  CL 102  CO2 23  GLUCOSE 116*  BUN 5*  CREATININE 0.73  CALCIUM 8.8   CBC:  Recent Labs Lab 09/24/12 0625  WBC 6.4  HGB 9.4*  HCT 30.1*  MCV 84.3  PLT 364    Recent Labs Lab 09/23/12 1958  GLUCAP 122*    Micro Results: Recent Results (from the past 240 hour(s))  ANAEROBIC CULTURE     Status: None   Collection Time    09/23/12  6:46 PM      Result Value Range Status   Specimen Description WOUND BACK   Final   Special Requests PATIENT ON FOLLOWING KEFLEX   Final   Gram Stain     Final   Value: FEW WBC PRESENT,BOTH PMN AND MONONUCLEAR     NO ORGANISMS SEEN  Performed at [facility]   Culture     Final   Value: NO ANAEROBES ISOLATED; CULTURE IN PROGRESS FOR 5 DAYS   Report Status PENDING   Incomplete  WOUND CULTURE     Status: None   Collection Time    09/23/12  6:46 PM      Result Value Range Status   Specimen Description WOUND BACK   Final   Special Requests PATIENT ON FOLLOWING KEFLEX   Final   Gram Stain     Final   Value: FEW WBC PRESENT,BOTH PMN AND MONONUCLEAR     NO SQUAMOUS EPITHELIAL CELLS SEEN     NO ORGANISMS SEEN   Culture NO GROWTH 2 DAYS   Final   Report Status 09/25/2012 FINAL   Final  GRAM STAIN     Status: None   Collection Time    09/23/12  6:46 PM      Result Value Range Status   Specimen Description WOUND BACK   Final    Special Requests PATIENT ON FOLLOWING KEFLEX   Final   Gram Stain     Final   Value: FEW WBC PRESENT,BOTH PMN AND MONONUCLEAR     NO ORGANISMS SEEN   Report Status 09/23/2012 FINAL   Final   Studies/Results: No results found. Medications: I have reviewed the patient's current medications. Scheduled Meds: . cefTRIAXone (ROCEPHIN)  IV  2 g Intravenous Q24H  . docusate sodium  100 mg Oral BID  . irbesartan  150 mg Oral Daily   And  . hydrochlorothiazide  25 mg Oral Daily  . levothyroxine  75 mcg Oral QAC breakfast  . pantoprazole  40 mg Oral Daily  . pregabalin  100 mg Oral TID  . rifampin  300 mg Oral Q12H  . sodium chloride  3 mL Intravenous Q12H  . traZODone  100 mg Oral QHS  . vancomycin  1,250 mg Intravenous Q12H   Continuous Infusions: . sodium chloride 250 mL (09/24/12 0046)  . 0.9 % NaCl with KCl 20 mEq / L 75 mL/hr at 09/26/12 2140   PRN Meds:.acetaminophen, acetaminophen, albuterol, ALPRAZolam, diphenhydrAMINE, HYDROmorphone (DILAUDID) injection, HYDROmorphone, menthol-cetylpyridinium, nystatin, ondansetron (ZOFRAN) IV, phenol, senna, sodium chloride, sodium chloride  Assessment/Plan: 62 yo female s/p posterior lumbar fusion surgery presented with wound infection and I & D was performed.  Lumbar wound:  Her white cell count is normal 6.4 but her acute phase markers are high with CRP 6.4 and platelets 364. Her blood cultures and wound cultures are negative to date. The OR report says the infection is superficial and does not involve the subfascial or fascial compartment. - We recommend 2 weeks of IV vancomycin and Ceftriaxone. - Rifampin should be discontinued as the wound is superficial. - We recommend follow up with ID clinic in 2 weeks.  This is a Psychologist, occupational Note.  The care of the patient was discussed with Dr. Judyann Munson and the assessment and plan formulated with their assistance.  Please see their attached note for official documentation of the daily  encounter.   LOS: 4 days   Virgina Evener, Med Student 09/27/2012, 8:18 AM

## 2012-09-27 NOTE — Discharge Summary (Signed)
Physician Discharge Summary  Patient ID: Yesenia [name] MRN: [id number] DOB/AGE: 06-13-1950 62 y.o.  Admit date: 09/23/2012 Discharge date: 09/27/2012  Admission Diagnoses:wound infection  Discharge Diagnoses: the same Active Problems:   * No active hospital problems. *   Discharged Condition: good  Hospital Course: the patient was admitted to [facility] on 09/23/2012 with a diagnosis of a postoperative wound infection. She underwent an incision and drainage of her wound. Cultures were obtained but were negative.  Dr. Ilsa Iha, infectious disease, saw the patient and recommended 2 weeks of IV vancomycin and Rocephin. A PICC line was placed.Arrangements were made for home IV antibiotics. Patient was subsequently discharged. She was given oral and written discharge instructions. All her questions were answered.  Consults:infectious disease Significant Diagnostic Studies:none Treatments:placement of PICC line, incision and drainage of wound. Discharge Exam: Blood pressure 131/55, pulse 79, temperature 97.7 F (36.5 C), temperature source Oral, resp. rate 20, height 5\' 1"  (1.549 m), weight 109.77 kg (242 lb), SpO2 98.00%. The patient is alert and pleasant. She looks well. Her wound is less red. There is no discharge.  Disposition: home with IV antibiotics.  Discharge Orders   Future Orders Complete By Expires     Call MD for:  difficulty breathing, headache or visual disturbances  As directed     Call MD for:  extreme fatigue  As directed     Call MD for:  hives  As directed     Call MD for:  persistant dizziness or light-headedness  As directed     Call MD for:  persistant nausea and vomiting  As directed     Call MD for:  redness, tenderness, or signs of infection (pain, swelling, redness, odor or green/yellow discharge around incision site)  As directed     Call MD for:  severe uncontrolled pain  As directed     Call MD for:  temperature >100.4  As directed     Diet - low sodium heart healthy  As directed     Discharge instructions  As directed     Comments:      Call [phone] for a followup appointment. Take a stool softener while you are using pain medications.    Driving Restrictions  As directed     Comments:      Do not drive for 2 weeks.    Increase activity slowly  As directed     Lifting restrictions  As directed     Comments:      Do not lift more than 5 pounds. No excessive bending or twisting.    May shower / Bathe  As directed     Comments:      He may shower after the pain she is removed 3 days after surgery. Leave the incision alone.    No dressing needed  As directed         Medication List    STOP taking these medications       cephALEXin 500 MG capsule  Commonly known as:  KEFLEX      TAKE these medications       albuterol 108 (90 BASE) MCG/ACT inhaler  Commonly known as:  PROVENTIL HFA;VENTOLIN HFA  Inhale 2 puffs into the lungs every 6 (six) hours as needed.     ALPRAZolam 0.5 MG tablet  Commonly known as:  XANAX  Take 0.5 mg by mouth 4 (four) times daily as needed. Anxiety     clotrimazole-betamethasone cream  Commonly known  as:  LOTRISONE  Apply 1 application topically daily as needed. Rash     dexlansoprazole 60 MG capsule  Commonly known as:  DEXILANT  Take 1 capsule (60 mg total) by mouth daily.     DSS 100 MG Caps  Take 100 mg by mouth 2 (two) times daily.     HYDROmorphone 4 MG tablet  Commonly known as:  DILAUDID  Take 4 mg by mouth every 4 (four) hours as needed for pain.     levothyroxine 88 MCG tablet  Commonly known as:  SYNTHROID, LEVOTHROID  Take 1 tablet (88 mcg total) by mouth daily.     pregabalin 100 MG capsule  Commonly known as:  LYRICA  Take 1 capsule (100 mg total) by mouth 3 (three) times daily.     traZODone 100 MG tablet  Commonly known as:  DESYREL  Take 100 mg by mouth at bedtime.     valsartan-hydrochlorothiazide 160-25 MG per tablet  Commonly known as:   DIOVAN HCT  Take 1 tablet by mouth daily.         SignedCristi Loron 09/27/2012, 7:51 AM

## 2012-09-28 ENCOUNTER — Encounter (HOSPITAL_COMMUNITY): Payer: Self-pay | Admitting: Neurological Surgery

## 2012-09-28 LAB — ANAEROBIC CULTURE

## 2012-10-01 ENCOUNTER — Encounter (HOSPITAL_COMMUNITY): Payer: Self-pay | Admitting: *Deleted

## 2012-10-01 ENCOUNTER — Emergency Department (HOSPITAL_COMMUNITY)
Admission: EM | Admit: 2012-10-01 | Discharge: 2012-10-01 | Disposition: A | Discharge status: Home / Self Care | Payer: PRIVATE HEALTH INSURANCE | Attending: Emergency Medicine | Admitting: Emergency Medicine

## 2012-10-01 DIAGNOSIS — Y838 Other surgical procedures as the cause of abnormal reaction of the patient, or of later complication, without mention of misadventure at the time of the procedure: Secondary | ICD-10-CM | POA: Insufficient documentation

## 2012-10-01 DIAGNOSIS — I10 Essential (primary) hypertension: Secondary | ICD-10-CM | POA: Insufficient documentation

## 2012-10-01 DIAGNOSIS — Z79899 Other long term (current) drug therapy: Secondary | ICD-10-CM | POA: Insufficient documentation

## 2012-10-01 DIAGNOSIS — Z8719 Personal history of other diseases of the digestive system: Secondary | ICD-10-CM | POA: Insufficient documentation

## 2012-10-01 DIAGNOSIS — Z87442 Personal history of urinary calculi: Secondary | ICD-10-CM | POA: Insufficient documentation

## 2012-10-01 DIAGNOSIS — Z9889 Other specified postprocedural states: Secondary | ICD-10-CM | POA: Insufficient documentation

## 2012-10-01 DIAGNOSIS — K219 Gastro-esophageal reflux disease without esophagitis: Secondary | ICD-10-CM | POA: Insufficient documentation

## 2012-10-01 DIAGNOSIS — E039 Hypothyroidism, unspecified: Secondary | ICD-10-CM | POA: Insufficient documentation

## 2012-10-01 DIAGNOSIS — F411 Generalized anxiety disorder: Secondary | ICD-10-CM | POA: Insufficient documentation

## 2012-10-01 DIAGNOSIS — T8140XA Infection following a procedure, unspecified, initial encounter: Secondary | ICD-10-CM | POA: Insufficient documentation

## 2012-10-01 DIAGNOSIS — Z8669 Personal history of other diseases of the nervous system and sense organs: Secondary | ICD-10-CM | POA: Insufficient documentation

## 2012-10-01 DIAGNOSIS — Z8739 Personal history of other diseases of the musculoskeletal system and connective tissue: Secondary | ICD-10-CM | POA: Insufficient documentation

## 2012-10-01 DIAGNOSIS — E119 Type 2 diabetes mellitus without complications: Secondary | ICD-10-CM | POA: Insufficient documentation

## 2012-10-01 DIAGNOSIS — Z8701 Personal history of pneumonia (recurrent): Secondary | ICD-10-CM | POA: Insufficient documentation

## 2012-10-01 MED ORDER — VANCOMYCIN HCL 10 G IV SOLR
1250.0000 mg | INTRAVENOUS | Status: AC
  Administered 2012-10-01: 1250 mg via INTRAVENOUS
  Filled 2012-10-01: qty 1250

## 2012-10-01 MED ORDER — DEXTROSE 5 % IV SOLN
2.0000 g | Freq: Once | INTRAVENOUS | Status: AC
  Administered 2012-10-01: 2 g via INTRAVENOUS
  Filled 2012-10-01: qty 2

## 2012-10-01 NOTE — Progress Notes (Signed)
Left Arm PICC site - PICC out at home- Site without bleeding or infection noted.  States no bleeding at home.  Site cleaned with CHG and dry 2x2 applied.  N oADN.
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
## 2012-10-01 NOTE — ED Notes (Signed)
Per Dr. Fredderick Phenix, pt to go home with PIV in place for long term abx treatment.

## 2012-10-01 NOTE — ED Notes (Signed)
Pt reports that she recently had back surgery, was sent home with a left brachial picc line for IV ABX therapy of vancomycin and and rocephin. States that she was asleep for 24 hours and her grandson woke her up to inform her that her picc lane was pulled out. Part of picc line out. Pt states that she wants to be admitted for picc line placement and completion of abx therapy.

## 2012-10-01 NOTE — Progress Notes (Signed)
Peripherally Inserted Central Catheter/Midline Placement  The IV Nurse has discussed with the patient and/or persons authorized to consent for the patient, the purpose of this procedure and the potential benefits and risks involved with this procedure.  The benefits include less needle sticks, lab draws from the catheter and patient may be discharged home with the catheter.  Risks include, but not limited to, infection, bleeding, blood clot (thrombus formation), and puncture of an artery; nerve damage and irregular heat beat.  Alternatives to this procedure were also discussed.  PICC/Midline Placement Documentation    After 1ml of 1% lidocaine was administered for local anesthetic  , Pt became tearful and refused to continue. After attempts to calm and reassure patient she persistently refused to continue. PICC placement was aborted per patient request and primary RN made aware.     Ronie Spies [facility] 10/01/2012, 7:48 PM

## 2012-10-03 ENCOUNTER — Telehealth: Payer: Self-pay | Admitting: Internal Medicine

## 2012-10-03 NOTE — Telephone Encounter (Signed)
Patient had picc line fall out on Saturday. She completed 18 days of IV therapy, with vanco and ceftriaxone. She was unable to get replacement of picc line due to being painful for insertion. Given that she was being treated for superficial wound infection. I am not going to ask her to have her picc line replaced. She states that her wound looks good except one small spot. I will prescribe bactrim 1 DS BID x 7 day. Her OR cultures were negative, but she is colonized with both MRSA and MSSA. She subscribes to vaginal candidiasis which she is getting fluc by NSGY office. i also recommended her to get. Will see her back in our office on 7/3. rx called into [facility].

## 2012-10-06 ENCOUNTER — Inpatient Hospital Stay: Payer: PRIVATE HEALTH INSURANCE | Admitting: Internal Medicine

## 2012-10-06 ENCOUNTER — Telehealth: Payer: Self-pay | Admitting: *Deleted

## 2012-10-06 ENCOUNTER — Ambulatory Visit: Payer: PRIVATE HEALTH INSURANCE | Admitting: Internal Medicine

## 2012-10-06 NOTE — Telephone Encounter (Signed)
Called the patient about her no show appt today and had to leave a message for her to call the office to reschedule asap.

## 2012-10-09 NOTE — Progress Notes (Signed)
Please see attending note

## 2012-10-17 ENCOUNTER — Encounter: Payer: Self-pay | Admitting: Cardiovascular Disease

## 2012-10-17 DIAGNOSIS — I959 Hypotension, unspecified: Secondary | ICD-10-CM

## 2012-10-17 DIAGNOSIS — I2699 Other pulmonary embolism without acute cor pulmonale: Secondary | ICD-10-CM

## 2012-10-17 DIAGNOSIS — I82409 Acute embolism and thrombosis of unspecified deep veins of unspecified lower extremity: Secondary | ICD-10-CM

## 2012-10-17 HISTORY — DX: Acute embolism and thrombosis of unspecified deep veins of unspecified lower extremity: I82.409

## 2012-10-17 HISTORY — DX: Other pulmonary embolism without acute cor pulmonale: I26.99

## 2012-10-18 ENCOUNTER — Encounter: Payer: Self-pay | Admitting: Cardiovascular Disease

## 2012-10-18 DIAGNOSIS — I319 Disease of pericardium, unspecified: Secondary | ICD-10-CM

## 2012-10-20 ENCOUNTER — Encounter: Payer: Self-pay | Admitting: Cardiovascular Disease

## 2012-10-26 ENCOUNTER — Ambulatory Visit: Payer: Self-pay | Admitting: Family Medicine

## 2012-10-27 ENCOUNTER — Ambulatory Visit (INDEPENDENT_AMBULATORY_CARE_PROVIDER_SITE_OTHER): Payer: Medicare Other | Admitting: Physician Assistant

## 2012-10-27 ENCOUNTER — Other Ambulatory Visit: Payer: Self-pay | Admitting: Physician Assistant

## 2012-10-27 ENCOUNTER — Encounter: Payer: Self-pay | Admitting: Physician Assistant

## 2012-10-27 VITALS — HR 64 | Temp 98.4°F | Resp 18 | Ht 61.5 in | Wt 234.0 lb

## 2012-10-27 DIAGNOSIS — I2699 Other pulmonary embolism without acute cor pulmonale: Secondary | ICD-10-CM | POA: Insufficient documentation

## 2012-10-27 DIAGNOSIS — Z86711 Personal history of pulmonary embolism: Secondary | ICD-10-CM | POA: Insufficient documentation

## 2012-10-27 DIAGNOSIS — Z86718 Personal history of other venous thrombosis and embolism: Secondary | ICD-10-CM | POA: Insufficient documentation

## 2012-10-27 DIAGNOSIS — E876 Hypokalemia: Secondary | ICD-10-CM

## 2012-10-27 DIAGNOSIS — Z7901 Long term (current) use of anticoagulants: Secondary | ICD-10-CM

## 2012-10-27 DIAGNOSIS — I82402 Acute embolism and thrombosis of unspecified deep veins of left lower extremity: Secondary | ICD-10-CM

## 2012-10-27 DIAGNOSIS — D649 Anemia, unspecified: Secondary | ICD-10-CM

## 2012-10-27 DIAGNOSIS — Z5181 Encounter for therapeutic drug level monitoring: Secondary | ICD-10-CM

## 2012-10-27 DIAGNOSIS — I82409 Acute embolism and thrombosis of unspecified deep veins of unspecified lower extremity: Secondary | ICD-10-CM

## 2012-10-27 NOTE — Progress Notes (Signed)
Patient ID: Yesenia [name] MRN: [id number], DOB: 1951-03-13, 62 y.o. Date of Encounter: @DATE @  Chief Complaint:  Chief Complaint  Patient presents with  . hospital follow up    DVT, PE    HPI: 62 y.o. year old female who is a pt of Dr. Jeanice Lim,  presents for f/u of recent hospitalization. She was at [facility], so I do not have hospital records to review. However, pt is good historian and provides history. She reports that she had recent back surgery. Had "revision" back surgery 09/23/2012. She says that [facility] came for what was supposed to be her "final"HHN visit on Monday 10/17/2012. Pt says she told her "she didn't feel good" and nurse noted that pts face was white as a sheet and that her lips were starting to turn blue"-called EMS-pt says she told them she did not want to go to [facility] but EMS told her she had to get to nearest hopital." Says she was there for 7 days. Says she was given Lovenox injections. Says she had DVT in Left calf and 2 PEs in Right Lung. From what she tells me, no other complications except that her potassium was very low and she was given potassium "horse pills."   She has been taking Coumadin. Has 5mg  tablets. Takes 1 1/2 daily to equal 7.5mg  QD. She has set alarm to take at 5pm QD.  She just went home from hospital late Monday.Has only taken dose on Tues, Wed so far. I do not have records to know what dose she was given at hospital.  She has Prescription sheet with labs to have drawn here as outpt. For HemeOccult, Serum Iron, CBC, BMET, INR.  She is feeling good with no complaints.     Past Medical History  Diagnosis Date  . Thyroid disease   . Hypertension   . Anxiety   . GERD (gastroesophageal reflux disease)   . DDD (degenerative disc disease), lumbar   . Facet arthritis of lumbar region   . S/P cardiac cath 4098,1191    Normal per report  . Status post placement of implantable loop recorder     Removed 2004  . Osteoporosis   . Diabetes mellitus  2013  . Fatty liver   . Complication of anesthesia     LONG TIME TO WAKE UP   . Pneumonia     2002  . Kidney stones   . H/O hiatal hernia   . Carpal tunnel syndrome   . Fibromyalgia   . Hypothyroidism   . Glaucoma     EARLY STAGES  . DVT (deep venous thrombosis) 10/17/2012  . PE (pulmonary embolism) 10/17/2012     Home Meds: See attached medication section for current medication list. Any medications entered into computer today will not appear on this note's list. The medications listed below were entered prior to today. Current Outpatient Prescriptions on File Prior to Visit  Medication Sig Dispense Refill  . ALPRAZolam (XANAX) 0.5 MG tablet Take 0.5 mg by mouth 3 (three) times daily as needed. Anxiety      . dexlansoprazole (DEXILANT) 60 MG capsule Take 1 capsule (60 mg total) by mouth daily.  30 capsule  3  . docusate sodium 100 MG CAPS Take 100 mg by mouth 2 (two) times daily.  60 capsule  0  . HYDROmorphone (DILAUDID) 4 MG tablet Take 4 mg by mouth every 4 (four) hours as needed for pain.      [name] levothyroxine (SYNTHROID, LEVOTHROID) 88 MCG  tablet Take 1 tablet (88 mcg total) by mouth daily.  30 tablet  3  . Nystatin (NYAMYC) 100000 UNIT/GM POWD Apply topically.      . pregabalin (LYRICA) 100 MG capsule Take 1 capsule (100 mg total) by mouth 3 (three) times daily.  90 capsule  3  . traZODone (DESYREL) 100 MG tablet Take 100 mg by mouth at bedtime.       [name] albuterol (PROVENTIL HFA;VENTOLIN HFA) 108 (90 BASE) MCG/ACT inhaler Inhale 2 puffs into the lungs every 6 (six) hours as needed.      . clotrimazole-betamethasone (LOTRISONE) cream Apply 1 application topically daily as needed. Rash  30 g  1  . dextrose 5 % SOLN 50 mL with cefTRIAXone 2 G SOLR 2 g Inject 2 g into the vein daily.  14 vial  0  . fluconazole (DIFLUCAN) 150 MG tablet Take 150 mg by mouth daily.      . sodium chloride 0.9 % SOLN 250 mL with vancomycin 10 G SOLR 1,250 mg Inject 1,250 mg into the vein every 12 (twelve)  hours.  24 vial    . valsartan-hydrochlorothiazide (DIOVAN HCT) 160-25 MG per tablet Take 1 tablet by mouth daily.  30 tablet  11   No current facility-administered medications on file prior to visit.    Allergies:  Allergies  Allergen Reactions  . Morphine Anaphylaxis and Other (See Comments)    Caused patient to CODE .  [name] Aspirin Other (See Comments)    G.I. Upset   . Codeine Itching, Swelling and Rash  . Nalbuphine Itching, Swelling and Rash    History   Social History  . Marital Status: Divorced    Spouse Name: N/A    Number of Children: 3  . Years of Education: N/A   Occupational History  . Disabled    Social History Main Topics  . Smoking status: Never Smoker   . Smokeless tobacco: Never Used  . Alcohol Use: No  . Drug Use: No  . Sexually Active: Not on file   Other Topics Concern  . Not on file   Social History Narrative  . No narrative on file    Family History  Problem Relation Age of Onset  . Hypertension Mother   . Depression Mother   . Vision loss Mother   . Osteoporosis Mother   . Colon cancer Mother   . Early death Brother     61  . Cancer Brother     colon  . Lung cancer Paternal Emelia Loron     was a smoker  . Asthma Grandchild      Review of Systems:  See HPI for pertinent ROS. All other ROS negative.    Physical Exam: Pulse 64, temperature 98.4 F (36.9 C), temperature source Oral, resp. rate 18, height 5' 1.5" (1.562 m), weight 234 lb (106.142 kg)., Body mass index is 43.5 kg/(m^2). General: Obese WF. Appears in no acute distress. Neck: Supple. No thyromegaly. No lymphadenopathy. Lungs: Clear bilaterally to auscultation without wheezes, rales, or rhonchi. Breathing is unlabored. Heart: RRR with S1 S2. No murmurs, rubs, or gallops. Musculoskeletal:  Strength and tone normal for age. Extremities/Skin: Warm and dry.  No edema.  Neuro: Alert and oriented X 3. Moves all extremities spontaneously. Gait is normal. CNII-XII grossly in  tact. Psych:  Responds to questions appropriately with a normal affect.   Results for orders placed in visit on 10/27/12  PT WITH INR/FINGERSTICK      Result Value Range  PT, fingerstick 30.0 (*) 10.4 - 12.5 seconds   INR, fingerstick 2.5 (*) 0.80 - 1.20      ASSESSMENT AND PLAN:  62 y.o. year old female with  1. DVT (deep venous thrombosis), left 2. PE (pulmonary embolism)  3. Anticoagulated on Coumadin  Continue current dose of Coumadin at 7.5mg  QD  (1 1/2   5mg  pills) RTC on Monday with Dr. Jeanice Lim for INR check.  4. Anemia - Fecal occult blood, imunochemical - Anemia panel - PT with INR/Fingerstick - Fecal occult blood, imunochemical; Future - Fecal occult blood, imunochemical; Future  5. Hypokalemia - BASIC METABOLIC PANEL WITH GFR   Signed, [address] [place], [place], [facility] 10/27/2012 5:21 PM

## 2012-10-28 ENCOUNTER — Telehealth: Payer: Self-pay | Admitting: Family Medicine

## 2012-10-28 LAB — BASIC METABOLIC PANEL WITH GFR
BUN: 11 mg/dL (ref 6–23)
Chloride: 100 mEq/L (ref 96–112)
Creat: 1.24 mg/dL — ABNORMAL HIGH (ref 0.50–1.10)
GFR, Est Non African American: 47 mL/min — ABNORMAL LOW
Glucose, Bld: 108 mg/dL — ABNORMAL HIGH (ref 70–99)

## 2012-10-28 LAB — CBC WITH DIFFERENTIAL/PLATELET
Eosinophils Absolute: 0.4 10*3/uL (ref 0.0–0.7)
Eosinophils Relative: 7 % — ABNORMAL HIGH (ref 0–5)
HCT: 30.1 % — ABNORMAL LOW (ref 36.0–46.0)
Hemoglobin: 9.3 g/dL — ABNORMAL LOW (ref 12.0–15.0)
Lymphocytes Relative: 42 % (ref 12–46)
Lymphs Abs: 2.7 10*3/uL (ref 0.7–4.0)
MCH: 24.7 pg — ABNORMAL LOW (ref 26.0–34.0)
MCV: 79.8 fL (ref 78.0–100.0)
Monocytes Absolute: 0.4 10*3/uL (ref 0.1–1.0)
Monocytes Relative: 6 % (ref 3–12)
Platelets: 321 10*3/uL (ref 150–400)
RBC: 3.77 MIL/uL — ABNORMAL LOW (ref 3.87–5.11)

## 2012-10-28 LAB — ANEMIA PANEL
Folate: 5.8 ng/mL
Iron: 14 ug/dL — ABNORMAL LOW (ref 42–145)
RBC.: 3.9 MIL/uL (ref 3.87–5.11)
TIBC: 345 ug/dL (ref 250–470)
UIBC: 331 ug/dL (ref 125–400)

## 2012-10-28 MED ORDER — POTASSIUM CHLORIDE ER 20 MEQ PO TBCR: 10.0000 meq | EXTENDED_RELEASE_TABLET | Freq: Every day | ORAL | Status: DC

## 2012-10-28 NOTE — Telephone Encounter (Signed)
Pt aware of lab results and provider recommendations.  Know about appt Monday and will be here

## 2012-10-28 NOTE — Telephone Encounter (Signed)
Left patient mess to keep appt on Monday.  Start potassium pill now.  Bring stool cards to Monday appt then will discuss iron pills.

## 2012-10-28 NOTE — Telephone Encounter (Signed)
Please let pt know her potassium is still a little low Start potassium pill once a day  Her iron levels are also a little low,she will need repeat labs on Monday, make sure she brings in the stool cards so we can check for blood - Do not start iron tablets just yet  Keep appt on Monday with me

## 2012-10-28 NOTE — Telephone Encounter (Signed)
Message copied by Donne Anon on Fri Oct 28, 2012  4:57 PM ------      Message from: Allayne Butcher      Created: Fri Oct 28, 2012  2:35 PM       Pt was recently hospitalized with DVT, PE, and Low Potassium.      1-Potassium level is slightly low. Her med list mentions KDur tabs but then says QD.       Call pt and get her to look at med bottles and verify dose that she is currently taking so we can then adjust it slightly.       Also, pt is to come for OV Monday with DrDurham for INR check.       Tell her to bring current med bottles to appt so we can get med list corrected---at OV with me on Thursday, Kim said her meds "were a mess and not real sure exactly what she was taking"-sec to recent hospitalization.      2- Iron is low. She needs to do hemeoccults ASAP and AFTER she does these, then stat Iron:        Send Rx:  Ferrous Sulfate 325mg  one po tid # 90/5.      Caution her this may cause constipation: incease water, fruits, vegetables, stool softener if needed. ------

## 2012-10-31 ENCOUNTER — Ambulatory Visit: Payer: Self-pay | Admitting: Physician Assistant

## 2012-10-31 ENCOUNTER — Ambulatory Visit (INDEPENDENT_AMBULATORY_CARE_PROVIDER_SITE_OTHER): Payer: Medicare Other | Admitting: Family Medicine

## 2012-10-31 VITALS — BP 120/60 | HR 68 | Temp 98.6°F | Resp 18 | Wt 233.0 lb

## 2012-10-31 DIAGNOSIS — Z5181 Encounter for therapeutic drug level monitoring: Secondary | ICD-10-CM

## 2012-10-31 DIAGNOSIS — D509 Iron deficiency anemia, unspecified: Secondary | ICD-10-CM

## 2012-10-31 DIAGNOSIS — I2699 Other pulmonary embolism without acute cor pulmonale: Secondary | ICD-10-CM

## 2012-10-31 DIAGNOSIS — Z7901 Long term (current) use of anticoagulants: Secondary | ICD-10-CM

## 2012-10-31 DIAGNOSIS — R791 Abnormal coagulation profile: Secondary | ICD-10-CM

## 2012-10-31 DIAGNOSIS — D649 Anemia, unspecified: Secondary | ICD-10-CM

## 2012-10-31 DIAGNOSIS — I82402 Acute embolism and thrombosis of unspecified deep veins of left lower extremity: Secondary | ICD-10-CM

## 2012-10-31 NOTE — Patient Instructions (Signed)
Do not take any coumadin- for next 2 days We will all with coumadin clinic in [facility] iron tablets 1 tablet twice a day  F/U 4 weeks

## 2012-11-01 ENCOUNTER — Encounter: Payer: Self-pay | Admitting: Family Medicine

## 2012-11-01 ENCOUNTER — Telehealth: Payer: Self-pay | Admitting: Family Medicine

## 2012-11-01 DIAGNOSIS — D509 Iron deficiency anemia, unspecified: Secondary | ICD-10-CM | POA: Insufficient documentation

## 2012-11-01 DIAGNOSIS — R791 Abnormal coagulation profile: Secondary | ICD-10-CM | POA: Insufficient documentation

## 2012-11-01 LAB — FECAL OCCULT BLOOD, IMMUNOCHEMICAL: Fecal Occult Blood: NEGATIVE

## 2012-11-01 NOTE — Assessment & Plan Note (Signed)
Plan to treat for total of 6 months. Her goal is INR between 2 and 3. Today she is super therapeutic INR I will have her hold her Coumadin today and tomorrow. Restart 5 mg daily on Wednesday. She'll be set up with the Coumadin clinic as this is closer to her home in [facility]

## 2012-11-01 NOTE — Telephone Encounter (Signed)
Message copied by Samuella Cota on Tue Nov 01, 2012 11:25 AM ------      Message from: Milinda Antis F      Created: Tue Nov 01, 2012  9:44 AM      Regarding: Please give message              Start coumadin 5mg  Wed and THrusday      She has coumadin check on Friday at [facility] in [place], they will take over from there ------

## 2012-11-01 NOTE — Assessment & Plan Note (Signed)
Per above hold Coumadin for the next 2 days. Will restart at 5 mg she'll be set up with Coumadin clinic which is closest to her home

## 2012-11-01 NOTE — Assessment & Plan Note (Signed)
Will start iron tablets twice a day. Her Hemoccults were sent off. She will need colonoscopy towards the end of the year. Her decreasing hemoglobin is likely results of her recent surgery and recovery.

## 2012-11-01 NOTE — Telephone Encounter (Signed)
Pt aware.

## 2012-11-01 NOTE — Progress Notes (Signed)
  Subjective:    Patient ID: Yesenia [name], female    DOB: 29-Jul-1950, 62 y.o.   MRN: [id number]  HPI  Patient here to followup PT/INR. She was recently hospitalized for DVT and PE after 2 subsequent back surgeries. She was started on Coumadin therapy. Her INR was checked last week which was 2.5. She's currently on 7.5 mg of Coumadin daily. She denies any active bleeding. Her records also show iron deficiency anemia. She is due to have a colonoscopy at the end of this year secondary to family history of colon cancer. She's returned stool studies today to have fecal call blood testing done on them. She's not start any current therapy at this time. Her last hemoglobin was 9.3. Overall she is doing well. She has a home health nurse who is still assisted with wound care on her back. She's graduated from physical therapy. Hospital discharge reviewed  Review of Systems    GEN- + fatigue, fever, weight loss,weakness, recent illness HEENT- denies eye drainage, change in vision, nasal discharge, CVS- denies chest pain, palpitations RESP- denies SOB, cough, wheeze MSK- + joint pain, muscle aches, injury Neuro- denies headache, dizziness, syncope, seizure activity      Objective:   Physical Exam GEN- NAD, alert and oriented x3 HEENT- PERRL, EOMI, non injected sclera, pink conjunctiva, MMM, oropharynx clear CVS- RRR, no murmur RESP-CTAB Skin- vertical incision lumbar back- granulation tissue, no surrounding erythema, no drianage EXT- Trace edema Pulses- Radial 2+        Assessment & Plan:

## 2012-11-01 NOTE — Assessment & Plan Note (Signed)
Normal oxygen saturation. She's not having any shortness of breath

## 2012-11-03 ENCOUNTER — Telehealth: Payer: Self-pay | Admitting: Family Medicine

## 2012-11-03 NOTE — Telephone Encounter (Signed)
PJ from Home [facility] called stating pt needs a letter stating needing handrail for Bathroom for apt complex, they can install it for her with no charge.

## 2012-11-04 ENCOUNTER — Ambulatory Visit (INDEPENDENT_AMBULATORY_CARE_PROVIDER_SITE_OTHER): Payer: PRIVATE HEALTH INSURANCE | Admitting: Cardiovascular Disease

## 2012-11-04 ENCOUNTER — Encounter: Payer: Self-pay | Admitting: Family Medicine

## 2012-11-04 ENCOUNTER — Ambulatory Visit (INDEPENDENT_AMBULATORY_CARE_PROVIDER_SITE_OTHER): Payer: PRIVATE HEALTH INSURANCE | Admitting: *Deleted

## 2012-11-04 ENCOUNTER — Encounter: Payer: Self-pay | Admitting: Cardiovascular Disease

## 2012-11-04 VITALS — BP 104/72 | HR 55 | Ht 61.0 in | Wt 237.0 lb

## 2012-11-04 DIAGNOSIS — Z9889 Other specified postprocedural states: Secondary | ICD-10-CM | POA: Insufficient documentation

## 2012-11-04 DIAGNOSIS — I2699 Other pulmonary embolism without acute cor pulmonale: Secondary | ICD-10-CM

## 2012-11-04 DIAGNOSIS — Z7901 Long term (current) use of anticoagulants: Secondary | ICD-10-CM | POA: Insufficient documentation

## 2012-11-04 DIAGNOSIS — I10 Essential (primary) hypertension: Secondary | ICD-10-CM

## 2012-11-04 DIAGNOSIS — I82409 Acute embolism and thrombosis of unspecified deep veins of unspecified lower extremity: Secondary | ICD-10-CM

## 2012-11-04 DIAGNOSIS — I517 Cardiomegaly: Secondary | ICD-10-CM

## 2012-11-04 NOTE — Patient Instructions (Signed)
   Stop Lisinopril Continue all other current medications. Your physician wants you to follow up in: 6 months.  You will receive a reminder letter in the mail one-two months in advance.  If you don't receive a letter, please call our office to schedule the follow up appointment

## 2012-11-04 NOTE — Telephone Encounter (Signed)
Order written

## 2012-11-04 NOTE — Progress Notes (Signed)
Patient ID: Yesenia [name], female   DOB: 1950/09/08, 62 y.o.   MRN: [id number]    CARDIOLOGY CONSULT NOTE  Patient ID: Yesenia [name] MRN: [id number] DOB/AGE: 11-14-1950 62 y.o.  Admit date: (Not on file) Primary Physician Reason for Consultation  HPI:  Yesenia [name] is a 62 y.o. Woman who was recently hospitalized for DVT and PE after 2 subsequent back surgeries. She was started on Coumadin therapy, and denies any active bleeding.  Her records also show iron deficiency anemia. She is due to have a colonoscopy at the end of this year secondary to a family history of colon cancer.   Overall she is doing well. She has a home health nurse who is still assisting with wound care on her back. She's graduated from physical therapy.  She is here to enroll in the Warfarin clinic. She denies chest pain, palpitations, and lightheadedness. She does get SOB with minimal exertion. The believes this predates her pulmonary embolism, but this has gotten worse since this diagnosis.  She denies a h/o of an MI, nor any heart problems per se.   Review of systems complete and found to be negative unless listed above in HPI  Past Medical History  Diagnosis  Date  .  Hypothyroidism Hypertension  Anxiety  GERD (gastroesophageal reflux disease)  DDD (degenerative disc disease), lumbar Facet arthritis of lumbar region  S/P cardiac cath [id number]  Normal per report  Status post placement of implantable loop recorder Removed 2004  Osteoporosis  Diabetes mellitus 2013  Fatty liver  Complication of anesthesia LONG TIME TO WAKE UP  Pneumonia 2002  Kidney stones  H/O hiatal hernia  Carpal tunnel syndrome  Fibromyalgia  Hypothyroidism  Glaucoma  EARLY STAGES  DVT (deep venous thrombosis) 10/17/2012  PE (pulmonary embolism) 10/17/2012     Family History  Problem Relation Age of Onset  . Hypertension Mother   . Depression Mother   . Vision loss Mother   . Osteoporosis Mother   . Colon  cancer Mother   . Early death Brother     34  . Cancer Brother     colon  . Lung cancer Paternal Emelia Loron     was a smoker  . Asthma Grandchild     History   Social History  . Marital Status: Divorced    Spouse Name: N/A    Number of Children: 3  . Years of Education: N/A   Occupational History  . Disabled    Social History Main Topics  . Smoking status: Never Smoker   . Smokeless tobacco: Never Used  . Alcohol Use: No  . Drug Use: No  . Sexually Active: Not on file   Other Topics Concern  . Not on file   Social History Narrative  . No narrative on file     BP 104/72  Pulse 55   Physical exam General: NAD Neck: No JVD, no thyromegaly or thyroid nodule.  Lungs: Clear to auscultation bilaterally with normal respiratory effort. CV: Nondisplaced PMI.  Heart regular S1/S2, no S3/S4, no murmur.  No peripheral edema.  No carotid bruit.  Normal pedal pulses.  Abdomen: Soft, nontender, no hepatosplenomegaly, no distention.  Skin: Intact without lesions or rashes.  Neurologic: Alert and oriented x 3.  Psych: Normal affect. Extremities: No clubbing or cyanosis.  HEENT: Normal.   Labs:   Lab Results  Component Value Date   WBC 6.3 10/27/2012   HGB 9.3* 10/27/2012   HCT 30.1* 10/27/2012   MCV  79.8 10/27/2012   PLT 321 10/27/2012   No results found for this basename: NA, K, CL, CO2, BUN, CREATININE, CALCIUM, LABALBU, PROT, BILITOT, ALKPHOS, ALT, AST, GLUCOSE,  in the last 168 hours No results found for this basename: CKTOTAL, CKMB, CKMBINDEX, TROPONINI    Lab Results  Component Value Date   CHOL 176 03/17/2012   CHOL 177 09/08/2011   Lab Results  Component Value Date   HDL 49 03/17/2012   HDL 47 09/08/2011   Lab Results  Component Value Date   LDLCALC 99 03/17/2012   LDLCALC 110* 09/08/2011   Lab Results  Component Value Date   TRIG 138 03/17/2012   TRIG 98 09/08/2011   Lab Results  Component Value Date   CHOLHDL 3.6 03/17/2012   CHOLHDL 3.8 09/08/2011   No  results found for this basename: LDLDIRECT      ECHO (July 2014): Normal LV systolic function, EF 60-65% Grade I diastolic dysfunction Mild LVH Moderate RV enlargement Mild RAE Mild IVC dilatation RVSP 37 mmHg    ASSESSMENT AND PLAN:  1. DVT and Pulmonary Embolism: will enroll in Warfarin clinic for INR monitoring. 2. HTN: controlled and on the lower side. Will stop Lisinopril, as she doesn't have an indication to be on both an ACEI and an ARB. Will continue Valsartan and HCTZ. 3. Cardiovascular (Right-heart enlargement): no indication for testing at this time. Her RV/RA enlargement can be clearly attributed to her pulmonary embolism, and this should improve (RV systolic function) over time.   Signed: Prentice Docker, M.D., F.A.C.C. 11/04/2012, 12:06 PM

## 2012-11-04 NOTE — Telephone Encounter (Signed)
Mailed out prescription per pt request

## 2012-11-08 DIAGNOSIS — R55 Syncope and collapse: Secondary | ICD-10-CM

## 2012-11-08 DIAGNOSIS — I498 Other specified cardiac arrhythmias: Secondary | ICD-10-CM

## 2012-11-09 ENCOUNTER — Telehealth: Payer: Self-pay | Admitting: Family Medicine

## 2012-11-09 NOTE — Telephone Encounter (Signed)
Pt already has HH, okay to give verbal orders to continue seeing They are seeing for wound care, oxygen sats and blood pressure

## 2012-11-09 NOTE — Telephone Encounter (Signed)
Kathlene November called back and orders were given. He will fax [place] for doctor's signature when they get it written up.

## 2012-11-09 NOTE — Telephone Encounter (Signed)
Pt was just released from the hospital and now she is wanting a home nurse to come out and monitor her. She called [facility] and they left a message during lunch saying that they need verbal orders from [place] to go out and see her. His name is Kathlene November and his number is [phone].

## 2012-11-10 ENCOUNTER — Ambulatory Visit (INDEPENDENT_AMBULATORY_CARE_PROVIDER_SITE_OTHER): Payer: PRIVATE HEALTH INSURANCE | Admitting: *Deleted

## 2012-11-10 DIAGNOSIS — Z7901 Long term (current) use of anticoagulants: Secondary | ICD-10-CM

## 2012-11-10 DIAGNOSIS — I2699 Other pulmonary embolism without acute cor pulmonale: Secondary | ICD-10-CM

## 2012-11-14 ENCOUNTER — Telehealth: Payer: Self-pay | Admitting: Cardiovascular Disease

## 2012-11-14 ENCOUNTER — Ambulatory Visit (INDEPENDENT_AMBULATORY_CARE_PROVIDER_SITE_OTHER): Payer: PRIVATE HEALTH INSURANCE | Admitting: *Deleted

## 2012-11-14 DIAGNOSIS — I2699 Other pulmonary embolism without acute cor pulmonale: Secondary | ICD-10-CM

## 2012-11-14 DIAGNOSIS — Z7901 Long term (current) use of anticoagulants: Secondary | ICD-10-CM

## 2012-11-14 NOTE — Telephone Encounter (Signed)
See coumadin note. 

## 2012-11-14 NOTE — Telephone Encounter (Signed)
INR 2.0 / please call with instructions / tgs °

## 2012-11-17 ENCOUNTER — Telehealth: Payer: Self-pay

## 2012-11-17 NOTE — Telephone Encounter (Signed)
Instructed to continue coumadin as ordered and check INR on 8/19.

## 2012-11-17 NOTE — Telephone Encounter (Signed)
PJ called stating patient fell and broke her ankle.  PJ report INR 2.0 the other day and patient was instructed to increase coumadin dose.  Patient is concerned to increase due to fracture.  Please advise.

## 2012-11-21 ENCOUNTER — Ambulatory Visit (INDEPENDENT_AMBULATORY_CARE_PROVIDER_SITE_OTHER): Payer: Medicare Other | Admitting: Family Medicine

## 2012-11-21 ENCOUNTER — Encounter: Payer: Self-pay | Admitting: Family Medicine

## 2012-11-21 VITALS — BP 124/70 | HR 70 | Temp 98.2°F | Ht 62.0 in | Wt 228.0 lb

## 2012-11-21 DIAGNOSIS — Z9889 Other specified postprocedural states: Secondary | ICD-10-CM

## 2012-11-21 DIAGNOSIS — R55 Syncope and collapse: Secondary | ICD-10-CM

## 2012-11-21 DIAGNOSIS — S82302A Unspecified fracture of lower end of left tibia, initial encounter for closed fracture: Secondary | ICD-10-CM

## 2012-11-21 DIAGNOSIS — S82309A Unspecified fracture of lower end of unspecified tibia, initial encounter for closed fracture: Secondary | ICD-10-CM | POA: Insufficient documentation

## 2012-11-21 DIAGNOSIS — S82899A Other fracture of unspecified lower leg, initial encounter for closed fracture: Secondary | ICD-10-CM

## 2012-11-21 DIAGNOSIS — Z7901 Long term (current) use of anticoagulants: Secondary | ICD-10-CM

## 2012-11-21 DIAGNOSIS — K635 Polyp of colon: Secondary | ICD-10-CM

## 2012-11-21 DIAGNOSIS — D509 Iron deficiency anemia, unspecified: Secondary | ICD-10-CM

## 2012-11-21 DIAGNOSIS — D126 Benign neoplasm of colon, unspecified: Secondary | ICD-10-CM

## 2012-11-21 DIAGNOSIS — I10 Essential (primary) hypertension: Secondary | ICD-10-CM

## 2012-11-21 MED ORDER — HYDROCODONE-ACETAMINOPHEN 10-325 MG PO TABS: 1.0000 | ORAL_TABLET | ORAL | Status: DC | PRN

## 2012-11-21 NOTE — Assessment & Plan Note (Signed)
Refer to ortho.

## 2012-11-21 NOTE — Progress Notes (Signed)
  Subjective:    Patient ID: Yesenia [name], female    DOB: [birth year], 62 y.o.   MRN: [id number]  HPI  Pt here to f/u recent admission for bradycardia and syncope thought to be secondary to atenolol and deconditioning. ER note and [facility] discharge summary reviewed after visit. Seen by cardiology medications adjusted. PE- needs PT/INR today, has HH reporting other PT to cardiology Pt fractured distal left tibia 1 week ago, was at home and fell- in need of referral to orthopedic surgeon, currently in cast, walking with walker Anemia- levels rechecked during admission ranged 8-9 due for colonoscopy   Review of Systems  GEN- denies fatigue, fever, weight loss,weakness, recent illness HEENT- denies eye drainage, change in vision, nasal discharge, CVS- denies chest pain, palpitations RESP- denies SOB, cough, wheeze ABD- denies N/V, change in stools, abd pain GU- denies dysuria, hematuria, dribbling, incontinence MSK- + joint pain, muscle aches, injury Neuro- denies headache, dizziness, syncope, seizure activity      Objective:   Physical Exam GEN- NAD, alert and oriented x3 HEENT- PERRL, EOMI, non injected sclera, pink conjunctiva, MMM, oropharynx clear CVS- RRR, no murmur RESP-CTAB Skin- vertical incision lumbar back- granulation tissue, no surrounding erythema, no drianage EXT- Trace edema Pulses- Radial 2+       Assessment & Plan:

## 2012-11-21 NOTE — Patient Instructions (Addendum)
Stop the demerol Start hydrocodone for pain INR today  Referral to orthopedics F/U 3 months

## 2012-11-22 ENCOUNTER — Ambulatory Visit (INDEPENDENT_AMBULATORY_CARE_PROVIDER_SITE_OTHER): Payer: PRIVATE HEALTH INSURANCE | Admitting: *Deleted

## 2012-11-22 DIAGNOSIS — Z7901 Long term (current) use of anticoagulants: Secondary | ICD-10-CM

## 2012-11-22 DIAGNOSIS — I2699 Other pulmonary embolism without acute cor pulmonale: Secondary | ICD-10-CM

## 2012-11-22 DIAGNOSIS — R55 Syncope and collapse: Secondary | ICD-10-CM | POA: Insufficient documentation

## 2012-11-22 NOTE — Assessment & Plan Note (Signed)
This is likley multifactorial as cardiology suggested. She's had recent back surgeries she had a wound infection she had PE on Coumadin therapy she is very deconditioned. I reviewed the records from the hospital.

## 2012-11-22 NOTE — Assessment & Plan Note (Signed)
S/p surgery, will d/c demerol had better pain control with hydrocodone

## 2012-11-22 NOTE — Assessment & Plan Note (Signed)
BP looks good today, will monitor

## 2012-11-22 NOTE — Assessment & Plan Note (Signed)
PT/INR wnl, today, HH nurse to resume checking and report to coumadin clinic

## 2012-11-22 NOTE — Assessment & Plan Note (Signed)
Reviewed labs minimal improvement in her hemoglobin now at 9.2. I will send her to gastroenterology of functioning to colonoscopy earlier

## 2012-11-28 ENCOUNTER — Ambulatory Visit: Payer: Medicare Other | Admitting: Family Medicine

## 2012-11-28 ENCOUNTER — Ambulatory Visit: Payer: Self-pay | Admitting: Cardiovascular Disease

## 2012-11-28 ENCOUNTER — Telehealth: Payer: Self-pay | Admitting: Family Medicine

## 2012-11-28 ENCOUNTER — Encounter: Payer: Self-pay | Admitting: Gastroenterology

## 2012-11-28 DIAGNOSIS — Z7901 Long term (current) use of anticoagulants: Secondary | ICD-10-CM

## 2012-11-28 DIAGNOSIS — I2699 Other pulmonary embolism without acute cor pulmonale: Secondary | ICD-10-CM

## 2012-11-28 NOTE — Telephone Encounter (Signed)
BP today was 130/100 Pain 7 out of 10 because of fractured left ankle Are we still going to set her up to see the specialist?

## 2012-11-28 NOTE — Telephone Encounter (Signed)
She has a referral that was placed on 8/18, please check into this  She needs to be seen ASAP

## 2012-11-29 NOTE — Telephone Encounter (Signed)
[facility] from Home [facility] is aware of appt time and will let pt know.

## 2012-12-09 ENCOUNTER — Ambulatory Visit: Payer: PRIVATE HEALTH INSURANCE | Admitting: *Deleted

## 2012-12-09 DIAGNOSIS — I82409 Acute embolism and thrombosis of unspecified deep veins of unspecified lower extremity: Secondary | ICD-10-CM

## 2012-12-09 DIAGNOSIS — Z7901 Long term (current) use of anticoagulants: Secondary | ICD-10-CM

## 2012-12-09 DIAGNOSIS — I2699 Other pulmonary embolism without acute cor pulmonale: Secondary | ICD-10-CM

## 2012-12-12 LAB — POCT INR: INR: 2.5

## 2012-12-14 ENCOUNTER — Ambulatory Visit: Payer: PRIVATE HEALTH INSURANCE | Admitting: *Deleted

## 2012-12-14 DIAGNOSIS — I2699 Other pulmonary embolism without acute cor pulmonale: Secondary | ICD-10-CM

## 2012-12-14 DIAGNOSIS — Z7901 Long term (current) use of anticoagulants: Secondary | ICD-10-CM

## 2012-12-19 ENCOUNTER — Ambulatory Visit: Payer: PRIVATE HEALTH INSURANCE | Admitting: *Deleted

## 2012-12-19 DIAGNOSIS — I2699 Other pulmonary embolism without acute cor pulmonale: Secondary | ICD-10-CM

## 2012-12-19 DIAGNOSIS — I82409 Acute embolism and thrombosis of unspecified deep veins of unspecified lower extremity: Secondary | ICD-10-CM

## 2012-12-19 DIAGNOSIS — Z7901 Long term (current) use of anticoagulants: Secondary | ICD-10-CM

## 2012-12-19 LAB — POCT INR: INR: 1.8

## 2012-12-26 ENCOUNTER — Ambulatory Visit: Payer: PRIVATE HEALTH INSURANCE | Admitting: *Deleted

## 2012-12-26 DIAGNOSIS — I2699 Other pulmonary embolism without acute cor pulmonale: Secondary | ICD-10-CM

## 2012-12-26 DIAGNOSIS — I82409 Acute embolism and thrombosis of unspecified deep veins of unspecified lower extremity: Secondary | ICD-10-CM

## 2012-12-26 DIAGNOSIS — Z7901 Long term (current) use of anticoagulants: Secondary | ICD-10-CM

## 2012-12-26 LAB — POCT INR: INR: 2

## 2012-12-27 ENCOUNTER — Encounter: Payer: Self-pay | Admitting: Gastroenterology

## 2012-12-27 ENCOUNTER — Ambulatory Visit (INDEPENDENT_AMBULATORY_CARE_PROVIDER_SITE_OTHER): Payer: PRIVATE HEALTH INSURANCE | Admitting: Gastroenterology

## 2012-12-27 VITALS — BP 136/85 | HR 69 | Temp 98.4°F | Ht 61.0 in | Wt 237.2 lb

## 2012-12-27 DIAGNOSIS — D649 Anemia, unspecified: Secondary | ICD-10-CM

## 2012-12-27 NOTE — Patient Instructions (Addendum)
Please complete the stool sample  We are requesting the most recent bloodwork from [facility]. We will likely recheck your iron levels.  At some point, we need to do a colonoscopy and an upper endoscopy; however, I need to discuss with cardiology and Dr. Jena Gauss the best time to do this.  Seek medical attention immediately if you have bright red blood per rectum or black/tarry stool.
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
## 2012-12-28 ENCOUNTER — Telehealth: Payer: Self-pay | Admitting: Gastroenterology

## 2012-12-28 DIAGNOSIS — D649 Anemia, unspecified: Secondary | ICD-10-CM | POA: Insufficient documentation

## 2012-12-28 NOTE — Telephone Encounter (Signed)
Let's get an updated iron and ferritin. I received an outside CBC to be abstracted, with Hgb improved about a gram since last drawn.  Awaiting ifobt.

## 2012-12-28 NOTE — Assessment & Plan Note (Addendum)
62 year old pleasant female with recent DVT/PE in July 2014 after back surgeries, now on Coumadin since July 2014 and likely to be on anticoagulation for 6 months. Presents with drifting Hgb, microcytic anemia since around time of back surgery, with evidence of low normal ferritin and low iron. This is in contrast to normal Hgb noted as recently as April 2014. Prior extensive surgical repair playing a part in some of her presentation, but as she is on Coumadin now, concern remains of occult GI bleeding. She has had negative hemoccult stools in July 2014 and no active signs of GI bleeding such as melena or hematochezia. She does endorse decreased appetite, early satiety as only GI symptoms.   With last EGD in 2006, would benefit from repeat EGD at some point. Last TCS in 2011, hyperplastic polyp, but she does have 2 family members with history of colon cancer; her brother was in his 30s and succumbed to the disease.   However, her recent diagnosis of DVT/PE and need for anticoagulation presents an interesting scenario. We will need to discuss this further with her cardiologist regarding timing of procedures due to recent blood clots. As she is not overtly bleeding and Hgb is stable, we will recheck an ifobt, iron, and ferritin now in the interim. At this point, I favor an observation approach until she has completed the course of Coumadin UNLESS there are signs of overt GI bleeding such as melena, hematochezia, further dropping Hgb, or positive hemoccults. Will discuss with Dr. Jena Gauss before final recommendations.

## 2012-12-29 ENCOUNTER — Other Ambulatory Visit: Payer: Self-pay

## 2012-12-29 DIAGNOSIS — D649 Anemia, unspecified: Secondary | ICD-10-CM

## 2012-12-29 LAB — CBC
HCT: 34 %
MCV: 76.5 fL — AB (ref 78–100)

## 2012-12-29 NOTE — Telephone Encounter (Signed)
Mailed letter and lab orders to pt. 

## 2012-12-29 NOTE — Progress Notes (Signed)
Briefly discussed with Dr. Jena Gauss. For now, we will observe; plans to pursue TCS/EGD in the future, at least 3 months from now. Will monitor for any overt signs of GI bleeding in the interim.   Please let pt know we need to update CBC, iron, and ferritin. Repeat ifobt as requested. Contact us if any changes in status. Otherwise, we can see her back sometime in November to set up procedures unless something drastically changes.

## 2012-12-29 NOTE — Progress Notes (Signed)
CC'd to PCP 

## 2012-12-30 ENCOUNTER — Ambulatory Visit (INDEPENDENT_AMBULATORY_CARE_PROVIDER_SITE_OTHER): Payer: Medicare Other | Admitting: Family Medicine

## 2012-12-30 VITALS — BP 134/80 | HR 80 | Temp 98.1°F | Resp 18 | Wt 231.4 lb

## 2012-12-30 DIAGNOSIS — Z23 Encounter for immunization: Secondary | ICD-10-CM

## 2012-12-30 DIAGNOSIS — D509 Iron deficiency anemia, unspecified: Secondary | ICD-10-CM

## 2012-12-30 DIAGNOSIS — R600 Localized edema: Secondary | ICD-10-CM

## 2012-12-30 DIAGNOSIS — R350 Frequency of micturition: Secondary | ICD-10-CM

## 2012-12-30 DIAGNOSIS — Z7901 Long term (current) use of anticoagulants: Secondary | ICD-10-CM

## 2012-12-30 DIAGNOSIS — N182 Chronic kidney disease, stage 2 (mild): Secondary | ICD-10-CM

## 2012-12-30 DIAGNOSIS — R609 Edema, unspecified: Secondary | ICD-10-CM

## 2012-12-30 DIAGNOSIS — R0602 Shortness of breath: Secondary | ICD-10-CM | POA: Insufficient documentation

## 2012-12-30 DIAGNOSIS — D649 Anemia, unspecified: Secondary | ICD-10-CM

## 2012-12-30 LAB — URINALYSIS, ROUTINE W REFLEX MICROSCOPIC
Glucose, UA: NEGATIVE mg/dL
Hgb urine dipstick: NEGATIVE
Leukocytes, UA: NEGATIVE
Nitrite: NEGATIVE
Protein, ur: NEGATIVE mg/dL
Urobilinogen, UA: 0.2 mg/dL (ref 0.0–1.0)
pH: 5 (ref 5.0–8.0)

## 2012-12-30 LAB — CBC WITH DIFFERENTIAL/PLATELET
Basophils Absolute: 0 10*3/uL (ref 0.0–0.1)
Eosinophils Absolute: 0 10*3/uL (ref 0.0–0.7)
Eosinophils Relative: 0 % (ref 0–5)
HCT: 36.4 % (ref 36.0–46.0)
Hemoglobin: 11.3 g/dL — ABNORMAL LOW (ref 12.0–15.0)
Lymphocytes Relative: 9 % — ABNORMAL LOW (ref 12–46)
Lymphs Abs: 1.1 10*3/uL (ref 0.7–4.0)
MCH: 24.1 pg — ABNORMAL LOW (ref 26.0–34.0)
MCV: 77.6 fL — ABNORMAL LOW (ref 78.0–100.0)
Monocytes Absolute: 1.1 10*3/uL — ABNORMAL HIGH (ref 0.1–1.0)
Monocytes Relative: 8 % (ref 3–12)
RDW: 19.3 % — ABNORMAL HIGH (ref 11.5–15.5)
WBC: 12.7 10*3/uL — ABNORMAL HIGH (ref 4.0–10.5)

## 2012-12-30 LAB — BASIC METABOLIC PANEL
BUN: 13 mg/dL (ref 6–23)
CO2: 26 mEq/L (ref 19–32)
Chloride: 102 mEq/L (ref 96–112)
Creat: 1.35 mg/dL — ABNORMAL HIGH (ref 0.50–1.10)
Glucose, Bld: 149 mg/dL — ABNORMAL HIGH (ref 70–99)
Potassium: 4.7 mEq/L (ref 3.5–5.3)

## 2012-12-30 MED ORDER — FUROSEMIDE 40 MG PO TABS: 40.0000 mg | ORAL_TABLET | Freq: Every day | ORAL | Status: DC

## 2012-12-30 MED ORDER — HYDROCODONE-ACETAMINOPHEN 10-325 MG PO TABS: 1.0000 | ORAL_TABLET | ORAL | Status: DC | PRN

## 2012-12-30 MED ORDER — PREGABALIN 50 MG PO CAPS: 50.0000 mg | ORAL_CAPSULE | Freq: Three times a day (TID) | ORAL | Status: DC

## 2012-12-30 MED ORDER — POTASSIUM CHLORIDE ER 10 MEQ PO TBCR: 10.0000 meq | EXTENDED_RELEASE_TABLET | Freq: Every day | ORAL | Status: DC

## 2012-12-30 MED ORDER — VALSARTAN 80 MG PO TABS: 80.0000 mg | ORAL_TABLET | Freq: Every day | ORAL | Status: DC

## 2012-12-30 NOTE — Patient Instructions (Addendum)
Stop the HCTZ Start lasix 1 tablet daily and elevate feet Call Monday and let us know legs are doing Labs to be done, we will call with results F/U as previous

## 2013-01-01 DIAGNOSIS — R350 Frequency of micturition: Secondary | ICD-10-CM | POA: Insufficient documentation

## 2013-01-01 NOTE — Progress Notes (Signed)
  Subjective:    Patient ID: Yesenia [name], female    DOB: 07-15-1950, 62 y.o.   MRN: [id number]  HPI  Pt here with leg swelling for the past 2 days, noticed it was increasing, no pain in legs, but feet feel tight. Urinary frequency past couple a days, denies pelvic or abd pain, denies dysuria or vaginal discharge Chills, SOB a few days ago, now resolved, no cough, no fever, no sinus drainage, +sick contacts with family members Request PT/INR had recent change, [facility] nurse to draw blood on Oct 6th   Review of Systems - per above  GEN- denies fatigue, fever, weight loss,weakness, recent illness, +chills HEENT- denies eye drainage, change in vision, nasal discharge, CVS- denies chest pain, palpitations RESP- denies SOB, cough, wheeze ABD- denies N/V, change in stools, abd pain GU- denies dysuria, hematuria, dribbling, incontinence, +frequency MSK- + joint pain, muscle aches, injury Neuro- denies headache, dizziness, syncope, seizure activity      Objective:   Physical Exam GEN- NAD, alert and oriented x3 HEENT- PERRL, EOMI, non injected sclera, pink conjunctiva, MMM, oropharynx clear Neck- Supple, no JVD CVS- RRR, no murmur RESP-CTAB ABD-NABS,soft,NT,ND, no suprapubic tenderness, no CVA tenderness EXT- 1+ bilat edema, no calf tenderness Pulses- Radial, DP- 2+        Assessment & Plan:

## 2013-01-01 NOTE — Assessment & Plan Note (Signed)
Has some diastolic dysfunction Recent DVT Multiple hospitalizations Elevate feet, start lasix

## 2013-01-01 NOTE — Assessment & Plan Note (Signed)
Denies currently Had Echo done last hospitlization showed mild diastolic dysfunction Will check BNP  Start lasix, Her oxygen saturation is normal She denies any cough, possible viral illness surfacing

## 2013-01-01 NOTE — Assessment & Plan Note (Signed)
Goal 2-3, check INR today

## 2013-01-01 NOTE — Assessment & Plan Note (Signed)
Has colonoscopy pending On iron supplement, recheck Hb

## 2013-01-01 NOTE — Assessment & Plan Note (Signed)
No sign of acute infection Check renal function CBG look good As acute and has leg swelling, will treat with lasix first, may consider anti-cholinergic if this does not improve

## 2013-01-01 NOTE — Assessment & Plan Note (Signed)
Check renal function, make sure not contributing to swelling

## 2013-01-02 ENCOUNTER — Telehealth: Payer: Self-pay | Admitting: Family Medicine

## 2013-01-02 NOTE — Telephone Encounter (Signed)
Pt states that swelling has went down quite a bit only symptom she is having is a cough.

## 2013-01-02 NOTE — Telephone Encounter (Signed)
Have her go back to HCTZ once a day, and stop the lasix, if the swelling returns call us If she has fever with cough, or thick sputum call us and antibiotic will be called in

## 2013-01-02 NOTE — Telephone Encounter (Signed)
Please call and see if leg swelling went down and if she has any other symptoms Also make sure [facility] received order for BMET to be drawn with next blood draw

## 2013-01-03 ENCOUNTER — Telehealth: Payer: Self-pay | Admitting: Family Medicine

## 2013-01-03 NOTE — Telephone Encounter (Signed)
Left message with [facility] to return my call today and yesterday will try again tomorrow

## 2013-01-04 ENCOUNTER — Ambulatory Visit (INDEPENDENT_AMBULATORY_CARE_PROVIDER_SITE_OTHER): Payer: PRIVATE HEALTH INSURANCE | Admitting: Gastroenterology

## 2013-01-04 DIAGNOSIS — D649 Anemia, unspecified: Secondary | ICD-10-CM

## 2013-01-04 NOTE — Progress Notes (Signed)
Tried to call pt- NA 

## 2013-01-04 NOTE — Telephone Encounter (Signed)
Left message with pt to return my call °

## 2013-01-05 ENCOUNTER — Telehealth: Payer: Self-pay | Admitting: Family Medicine

## 2013-01-05 NOTE — Progress Notes (Signed)
ifobt negative.   Lab Results  Component Value Date   WBC 12.7* 12/30/2012   HGB 11.3* 12/30/2012   HCT 36.4 12/30/2012   MCV 77.6* 12/30/2012   PLT 271 12/30/2012   Hgb with improvement. As patient is on anticoagulation due to DVT/PE, we will continue to monitor her blood work and any signs of overt GI bleeding.   Let's repeat CBC, iron, ferritin in 4-6 weeks.   Return in November to discuss EGD/TCS

## 2013-01-05 NOTE — Progress Notes (Signed)
Tried to call pt- LMOM 

## 2013-01-05 NOTE — Progress Notes (Signed)
Pt returned ifobt and it was negative.  

## 2013-01-05 NOTE — Telephone Encounter (Signed)
Pt said that there is a 4 lb dog at the shelter that she would like to get. She said everyone has approved that she can get it, but she needs a letter from you stating that she needs a companion/service dog. Can you write this for her?

## 2013-01-06 ENCOUNTER — Encounter: Payer: Self-pay | Admitting: Family Medicine

## 2013-01-06 NOTE — Telephone Encounter (Signed)
Letter written-mail to patient

## 2013-01-07 LAB — IRON: Iron: 38 ug/dL — ABNORMAL LOW (ref 42–145)

## 2013-01-07 LAB — FERRITIN: Ferritin: 44 ng/mL (ref 10–291)

## 2013-01-09 NOTE — Progress Notes (Signed)
Quick Note:  Ferritin improved, still normal.  Iron improving, low but still improved.  Will continue to monitor for now. ______

## 2013-01-09 NOTE — Telephone Encounter (Signed)
Pt aware of message

## 2013-01-10 ENCOUNTER — Other Ambulatory Visit: Payer: Self-pay | Admitting: Gastroenterology

## 2013-01-10 ENCOUNTER — Ambulatory Visit: Payer: PRIVATE HEALTH INSURANCE | Admitting: *Deleted

## 2013-01-10 DIAGNOSIS — Z7901 Long term (current) use of anticoagulants: Secondary | ICD-10-CM

## 2013-01-10 DIAGNOSIS — I82409 Acute embolism and thrombosis of unspecified deep veins of unspecified lower extremity: Secondary | ICD-10-CM

## 2013-01-10 DIAGNOSIS — D649 Anemia, unspecified: Secondary | ICD-10-CM

## 2013-01-10 DIAGNOSIS — I2699 Other pulmonary embolism without acute cor pulmonale: Secondary | ICD-10-CM

## 2013-01-10 NOTE — Progress Notes (Signed)
Pt aware.

## 2013-01-10 NOTE — Progress Notes (Signed)
Pt is aware. Lab order on file.  Darl Pikes please schedule ov in November.

## 2013-01-12 ENCOUNTER — Encounter: Payer: Self-pay | Admitting: Internal Medicine

## 2013-01-12 NOTE — Progress Notes (Signed)
Pt is aware of OV on 11/10 at 10 with [facility] and appt card was mailed

## 2013-01-20 ENCOUNTER — Ambulatory Visit (INDEPENDENT_AMBULATORY_CARE_PROVIDER_SITE_OTHER): Payer: PRIVATE HEALTH INSURANCE | Admitting: *Deleted

## 2013-01-20 DIAGNOSIS — I2699 Other pulmonary embolism without acute cor pulmonale: Secondary | ICD-10-CM

## 2013-01-20 DIAGNOSIS — Z7901 Long term (current) use of anticoagulants: Secondary | ICD-10-CM

## 2013-01-20 DIAGNOSIS — I82409 Acute embolism and thrombosis of unspecified deep veins of unspecified lower extremity: Secondary | ICD-10-CM

## 2013-01-20 LAB — POCT INR: INR: 3.6

## 2013-01-31 ENCOUNTER — Other Ambulatory Visit: Payer: Self-pay

## 2013-01-31 DIAGNOSIS — D649 Anemia, unspecified: Secondary | ICD-10-CM

## 2013-02-03 ENCOUNTER — Ambulatory Visit (INDEPENDENT_AMBULATORY_CARE_PROVIDER_SITE_OTHER): Payer: PRIVATE HEALTH INSURANCE | Admitting: *Deleted

## 2013-02-03 DIAGNOSIS — I2699 Other pulmonary embolism without acute cor pulmonale: Secondary | ICD-10-CM

## 2013-02-03 DIAGNOSIS — I82409 Acute embolism and thrombosis of unspecified deep veins of unspecified lower extremity: Secondary | ICD-10-CM

## 2013-02-03 DIAGNOSIS — Z7901 Long term (current) use of anticoagulants: Secondary | ICD-10-CM

## 2013-02-03 LAB — POCT INR: INR: 2.5

## 2013-02-07 LAB — CBC WITH DIFFERENTIAL/PLATELET
Basophils Absolute: 0 10*3/uL (ref 0.0–0.1)
Basophils Relative: 1 % (ref 0–1)
Eosinophils Absolute: 0.2 10*3/uL (ref 0.0–0.7)
Eosinophils Relative: 4 % (ref 0–5)
HCT: 36.8 % (ref 36.0–46.0)
Hemoglobin: 11.6 g/dL — ABNORMAL LOW (ref 12.0–15.0)
MCH: 24.7 pg — ABNORMAL LOW (ref 26.0–34.0)
MCHC: 31.5 g/dL (ref 30.0–36.0)
MCV: 78.3 fL (ref 78.0–100.0)
Monocytes Absolute: 0.4 10*3/uL (ref 0.1–1.0)
Monocytes Relative: 10 % (ref 3–12)
Neutro Abs: 1.8 10*3/uL (ref 1.7–7.7)
Platelets: 263 10*3/uL (ref 150–400)
RDW: 17.8 % — ABNORMAL HIGH (ref 11.5–15.5)

## 2013-02-07 LAB — FERRITIN: Ferritin: 17 ng/mL (ref 10–291)

## 2013-02-07 LAB — IRON: Iron: 30 ug/dL — ABNORMAL LOW (ref 42–145)

## 2013-02-09 ENCOUNTER — Other Ambulatory Visit: Payer: Self-pay

## 2013-02-12 NOTE — Progress Notes (Signed)
Quick Note:  Hgb stable, iron and ferritin drifting. ifobt negative.  To discuss procedures at upcoming appt. On Coumadin.   ______

## 2013-02-13 ENCOUNTER — Other Ambulatory Visit: Payer: Self-pay | Admitting: Family Medicine

## 2013-02-13 ENCOUNTER — Ambulatory Visit (INDEPENDENT_AMBULATORY_CARE_PROVIDER_SITE_OTHER): Payer: Medicaid Other | Admitting: Gastroenterology

## 2013-02-13 ENCOUNTER — Encounter: Payer: Self-pay | Admitting: Gastroenterology

## 2013-02-13 VITALS — BP 116/78 | HR 82 | Temp 97.6°F | Wt 228.0 lb

## 2013-02-13 DIAGNOSIS — D649 Anemia, unspecified: Secondary | ICD-10-CM

## 2013-02-13 DIAGNOSIS — K219 Gastro-esophageal reflux disease without esophagitis: Secondary | ICD-10-CM

## 2013-02-13 MED ORDER — RANITIDINE HCL 150 MG PO TABS: 150.0000 mg | ORAL_TABLET | Freq: Every day | ORAL | Status: DC

## 2013-02-13 MED ORDER — PROMETHAZINE HCL 25 MG PO TABS: 12.5000 mg | ORAL_TABLET | Freq: Four times a day (QID) | ORAL | Status: DC | PRN

## 2013-02-13 MED ORDER — RANITIDINE HCL 150 MG PO TABS: 300.0000 mg | ORAL_TABLET | Freq: Every day | ORAL | Status: DC

## 2013-02-13 NOTE — Assessment & Plan Note (Signed)
Several episodes of nausea and vomiting after eating high-fat, greasy foods (see HPI). Vague reports of food "sticking" in epigastric region but not clear if true esophageal dysphagia. With presence of Coumadin, would rather continue current plan of a supportive approach, with an EGD/ED in Jan/Feb, once off Coumadin. In interim, avoid fatty foods, add Zantac at night, continue Dexilant each morning. Follow-up with me in January 2015 to schedule procedures.

## 2013-02-13 NOTE — Assessment & Plan Note (Signed)
Multifactorial with an early IDA component likely. Continues to remain heme negative on multiple prior occasions. No overt signs of GI bleeding. With family history of colon cancer, would recommend early interval colonoscopy at time of EGD/ED. Need to wait until off Coumadin, which patient believes may be in January. Risk of bleeding increased with procedures on Coumadin; as she is stable, will continue careful observation.

## 2013-02-13 NOTE — Patient Instructions (Signed)
Continue Dexilant each morning. Add Zantac 150 mg each evening. I sent this to your pharmacy.  You may take Phenergan as needed for nausea. Do not drive while taking this. It can cause drowsiness.   We will see you back in January 2015. Hopefully, you will be off Coumadin then, and we can proceed with the procedures. Watch for any bright red blood or black, tarry stool.
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
## 2013-02-13 NOTE — Progress Notes (Signed)
cc'd to pcp 

## 2013-02-20 ENCOUNTER — Telehealth: Payer: Self-pay

## 2013-02-20 NOTE — Telephone Encounter (Signed)
Patient contacted on behalf of [facility] she is due for a mammogram and lipid panel.  She reports she will see Dr. Jeanice Lim in the am and discuss with her.

## 2013-02-21 ENCOUNTER — Encounter: Payer: Self-pay | Admitting: Family Medicine

## 2013-02-21 ENCOUNTER — Ambulatory Visit (INDEPENDENT_AMBULATORY_CARE_PROVIDER_SITE_OTHER): Payer: Medicare Other | Admitting: Family Medicine

## 2013-02-21 VITALS — BP 110/68 | HR 68 | Temp 97.7°F | Resp 18 | Ht 62.0 in | Wt 232.0 lb

## 2013-02-21 DIAGNOSIS — N182 Chronic kidney disease, stage 2 (mild): Secondary | ICD-10-CM

## 2013-02-21 DIAGNOSIS — E119 Type 2 diabetes mellitus without complications: Secondary | ICD-10-CM

## 2013-02-21 DIAGNOSIS — Z7901 Long term (current) use of anticoagulants: Secondary | ICD-10-CM

## 2013-02-21 DIAGNOSIS — I82409 Acute embolism and thrombosis of unspecified deep veins of unspecified lower extremity: Secondary | ICD-10-CM

## 2013-02-21 DIAGNOSIS — E039 Hypothyroidism, unspecified: Secondary | ICD-10-CM

## 2013-02-21 DIAGNOSIS — I10 Essential (primary) hypertension: Secondary | ICD-10-CM

## 2013-02-21 LAB — BASIC METABOLIC PANEL WITH GFR
CO2: 31 mEq/L (ref 19–32)
Calcium: 9.8 mg/dL (ref 8.4–10.5)
Creat: 1.04 mg/dL (ref 0.50–1.10)
GFR, Est African American: 67 mL/min
GFR, Est Non African American: 58 mL/min — ABNORMAL LOW
Sodium: 142 mEq/L (ref 135–145)

## 2013-02-21 LAB — PROTIME-INR
INR: 1.75 — ABNORMAL HIGH (ref <1.50)
Prothrombin Time: 20 seconds — ABNORMAL HIGH (ref 11.6–15.2)

## 2013-02-21 LAB — HEMOGLOBIN A1C
Hgb A1c MFr Bld: 6 % — ABNORMAL HIGH (ref <5.7)
Mean Plasma Glucose: 126 mg/dL — ABNORMAL HIGH (ref <117)

## 2013-02-21 LAB — TSH: TSH: 7.745 u[IU]/mL — ABNORMAL HIGH (ref 0.350–4.500)

## 2013-02-21 MED ORDER — DEXLANSOPRAZOLE 60 MG PO CPDR: DELAYED_RELEASE_CAPSULE | ORAL | Status: DC

## 2013-02-21 MED ORDER — HYDROCODONE-ACETAMINOPHEN 10-325 MG PO TABS: 1.0000 | ORAL_TABLET | ORAL | Status: DC | PRN

## 2013-02-21 MED ORDER — POTASSIUM CHLORIDE ER 10 MEQ PO TBCR: 10.0000 meq | EXTENDED_RELEASE_TABLET | Freq: Every day | ORAL | Status: DC

## 2013-02-21 MED ORDER — WARFARIN SODIUM 5 MG PO TABS: 5.0000 mg | ORAL_TABLET | Freq: Every day | ORAL | Status: DC

## 2013-02-21 NOTE — Assessment & Plan Note (Signed)
Will check PT/INR and forward to the Coumadin

## 2013-02-21 NOTE — Assessment & Plan Note (Signed)
I will recheck her A1c today. She's currently diet controlled

## 2013-02-21 NOTE — Assessment & Plan Note (Signed)
Check TSH 

## 2013-02-21 NOTE — Assessment & Plan Note (Signed)
Recheck her renal function.

## 2013-02-21 NOTE — Assessment & Plan Note (Signed)
Blood pressure looks okay we'll continue her current regimen

## 2013-02-21 NOTE — Progress Notes (Signed)
  Subjective:    Patient ID: Yesenia [name], female    DOB: 03/20/1951, 62 y.o.   MRN: [id number]  HPI Patient here to follow chronic medical problems. She is due for some medication refills today . She needs PT/INR checked secondary to her ongoing Coumadin for pulmonary embolism and DVT  Anemia-her hemoglobin has improved to 11.4. She was evaluated by GI they're waiting until she comes off the Coumadin before she has a colonoscopy  Diabetes mellitus diet controlled her fasting CBGs have been 120 or less. Her every once he was last 6.2%  Due for labs for renal function    Review of Systems  GEN- denies fatigue, fever, weight loss,weakness, recent illness HEENT- denies eye drainage, change in vision, nasal discharge, CVS- denies chest pain, palpitations RESP- denies SOB, cough, wheeze ABD- denies N/V, change in stools, abd pain GU- denies dysuria, hematuria, dribbling, incontinence MSK- + joint pain, muscle aches, injury Neuro- denies headache, dizziness, syncope, seizure activity      Objective:   Physical Exam GEN- NAD, alert and oriented x3 HEENT- PERRL, EOMI, non injected sclera, pink conjunctiva, MMM, oropharynx clear CVS- RRR, no murmur RESP-CTAB EXT- trace bilat edema, Pulses- Radial, DP- 2+        Assessment & Plan:

## 2013-02-21 NOTE — Patient Instructions (Signed)
We will continue current medications We will call with the INR Lab letter for others if normal F/U 3 months

## 2013-02-23 ENCOUNTER — Ambulatory Visit (INDEPENDENT_AMBULATORY_CARE_PROVIDER_SITE_OTHER): Payer: PRIVATE HEALTH INSURANCE | Admitting: *Deleted

## 2013-02-23 DIAGNOSIS — I2699 Other pulmonary embolism without acute cor pulmonale: Secondary | ICD-10-CM

## 2013-02-23 DIAGNOSIS — Z7901 Long term (current) use of anticoagulants: Secondary | ICD-10-CM

## 2013-02-23 DIAGNOSIS — I82409 Acute embolism and thrombosis of unspecified deep veins of unspecified lower extremity: Secondary | ICD-10-CM

## 2013-02-28 MED ORDER — LEVOTHYROXINE SODIUM 100 MCG PO TABS: 100.0000 ug | ORAL_TABLET | Freq: Every day | ORAL | Status: DC

## 2013-02-28 NOTE — Addendum Note (Signed)
Addended by: Elvina Mattes T on: 02/28/2013 12:08 PM   Modules accepted: Orders

## 2013-03-10 ENCOUNTER — Ambulatory Visit (INDEPENDENT_AMBULATORY_CARE_PROVIDER_SITE_OTHER): Payer: PRIVATE HEALTH INSURANCE | Admitting: *Deleted

## 2013-03-10 DIAGNOSIS — Z7901 Long term (current) use of anticoagulants: Secondary | ICD-10-CM

## 2013-03-10 DIAGNOSIS — I82409 Acute embolism and thrombosis of unspecified deep veins of unspecified lower extremity: Secondary | ICD-10-CM

## 2013-03-10 DIAGNOSIS — I2699 Other pulmonary embolism without acute cor pulmonale: Secondary | ICD-10-CM

## 2013-03-10 LAB — POCT INR: INR: 3.4

## 2013-03-24 ENCOUNTER — Ambulatory Visit (INDEPENDENT_AMBULATORY_CARE_PROVIDER_SITE_OTHER): Payer: PRIVATE HEALTH INSURANCE | Admitting: Pharmacist

## 2013-03-24 DIAGNOSIS — I2699 Other pulmonary embolism without acute cor pulmonale: Secondary | ICD-10-CM

## 2013-03-24 DIAGNOSIS — Z7901 Long term (current) use of anticoagulants: Secondary | ICD-10-CM

## 2013-04-14 ENCOUNTER — Ambulatory Visit (INDEPENDENT_AMBULATORY_CARE_PROVIDER_SITE_OTHER): Payer: PRIVATE HEALTH INSURANCE | Admitting: *Deleted

## 2013-04-14 DIAGNOSIS — I2699 Other pulmonary embolism without acute cor pulmonale: Secondary | ICD-10-CM

## 2013-04-14 DIAGNOSIS — Z7901 Long term (current) use of anticoagulants: Secondary | ICD-10-CM

## 2013-04-14 DIAGNOSIS — I82409 Acute embolism and thrombosis of unspecified deep veins of unspecified lower extremity: Secondary | ICD-10-CM

## 2013-04-14 LAB — POCT INR: INR: 1.2

## 2013-04-18 ENCOUNTER — Other Ambulatory Visit: Payer: Self-pay | Admitting: Family Medicine

## 2013-04-25 ENCOUNTER — Encounter: Payer: Self-pay | Admitting: Cardiovascular Disease

## 2013-04-25 ENCOUNTER — Ambulatory Visit (INDEPENDENT_AMBULATORY_CARE_PROVIDER_SITE_OTHER): Payer: PRIVATE HEALTH INSURANCE | Admitting: *Deleted

## 2013-04-25 ENCOUNTER — Ambulatory Visit (INDEPENDENT_AMBULATORY_CARE_PROVIDER_SITE_OTHER): Payer: PRIVATE HEALTH INSURANCE | Admitting: Cardiovascular Disease

## 2013-04-25 VITALS — BP 119/79 | HR 73 | Ht 61.0 in | Wt 240.0 lb

## 2013-04-25 DIAGNOSIS — I2699 Other pulmonary embolism without acute cor pulmonale: Secondary | ICD-10-CM

## 2013-04-25 DIAGNOSIS — Z7901 Long term (current) use of anticoagulants: Secondary | ICD-10-CM

## 2013-04-25 DIAGNOSIS — I10 Essential (primary) hypertension: Secondary | ICD-10-CM

## 2013-04-25 DIAGNOSIS — I82409 Acute embolism and thrombosis of unspecified deep veins of unspecified lower extremity: Secondary | ICD-10-CM

## 2013-04-25 DIAGNOSIS — I517 Cardiomegaly: Secondary | ICD-10-CM

## 2013-04-25 LAB — POCT INR: INR: 2.5

## 2013-04-25 NOTE — Patient Instructions (Signed)
Your physician recommends that you schedule a follow-up appointment in: 6 months with Dr. Bronson Ing. You should receive a letter in the mail in 4 months. If you do not receive this letter by May 2015 call our office to schedule this appointment.   Your physician recommends that you continue on your current medications as directed. Please refer to the Current Medication list given to you today.
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
## 2013-05-02 ENCOUNTER — Encounter: Payer: Self-pay | Admitting: Gastroenterology

## 2013-05-02 ENCOUNTER — Ambulatory Visit (INDEPENDENT_AMBULATORY_CARE_PROVIDER_SITE_OTHER): Payer: PRIVATE HEALTH INSURANCE | Admitting: Gastroenterology

## 2013-05-02 VITALS — BP 123/83 | HR 85 | Temp 98.1°F | Wt 241.0 lb

## 2013-05-02 DIAGNOSIS — D649 Anemia, unspecified: Secondary | ICD-10-CM

## 2013-05-02 LAB — FERRITIN: Ferritin: 13 ng/mL (ref 10–291)

## 2013-05-02 LAB — CBC
HCT: 36.6 % (ref 36.0–46.0)
Hemoglobin: 11.9 g/dL — ABNORMAL LOW (ref 12.0–15.0)
MCH: 26.2 pg (ref 26.0–34.0)
MCHC: 32.5 g/dL (ref 30.0–36.0)
MCV: 80.4 fL (ref 78.0–100.0)
PLATELETS: 245 10*3/uL (ref 150–400)
RBC: 4.55 MIL/uL (ref 3.87–5.11)
RDW: 17.3 % — ABNORMAL HIGH (ref 11.5–15.5)
WBC: 4.9 10*3/uL (ref 4.0–10.5)

## 2013-05-02 LAB — IRON: IRON: 33 ug/dL — AB (ref 42–145)

## 2013-05-02 MED ORDER — PEG 3350-KCL-NA BICARB-NACL 420 G PO SOLR: 4000.0000 mL | ORAL | Status: DC

## 2013-05-02 NOTE — Progress Notes (Addendum)
Pt is scheduled for 2/17/15at 9;30am.  Rx sent to the pharmacy and we will mail instructions once we receive directions about Lovenox bridge.  Addendum: as of 05/09/13, patient to remain ON COUMADIN for procedure. See assessment/plan.

## 2013-05-02 NOTE — Patient Instructions (Signed)
Please have blood work done today. We will call with results.  We have scheduled you for a colonoscopy and upper endoscopy with Dr. Gala Romney in the near future.   You will stop your Coumadin 4 days before the procedure, and you will need Lovenox shots. We will help arrange this with you and talk with cardiology about the best way to coordinate this.
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
## 2013-05-03 ENCOUNTER — Telehealth: Payer: Self-pay

## 2013-05-03 NOTE — Telephone Encounter (Signed)
Yesenia [name], this pt is schedule for a colonoscopy and egd with Dr. Gala Romney on 05/23/13. Is it ok for the pt to stop her coumadin for 4 days prior to procedure? Does she need lovenox bridging? If this is ok, do you want [place] to handle it?   Thank you,  Almyra Free

## 2013-05-04 MED ORDER — ENOXAPARIN SODIUM 150 MG/ML ~~LOC~~ SOLN: 150.0000 mg | SUBCUTANEOUS | Status: DC

## 2013-05-04 NOTE — Telephone Encounter (Signed)
Routing to Anna

## 2013-05-04 NOTE — Telephone Encounter (Signed)
Dr K, Please advise. 

## 2013-05-04 NOTE — Assessment & Plan Note (Addendum)
63 year old female with likely a mixed anemia, microcytic, with low normal ferritin and low iron, heme negative stools. Needs updated colonoscopy and upper endoscopy due to new finding of likely early IDA. No overt signs of GI bleeding. Her course has been somewhat complicated by DVT/PE in July of last year. GI evaluation has been put on hold until this year so she could safely come off Coumadin and be bridged with Lovenox. She has no significant GI symptoms of note. Last TCS in 2011 with hyperplastic polyp but +FH of colon cancer in both mother and brother. Last EGD in remote past.   Proceed with TCS/EGD with Dr. Gala Romney in near future: the risks, benefits, and alternatives have been discussed with the patient in detail. The patient states understanding and desires to proceed. Consider capsule study if no source for anemia UPDATE: PATIENT WILL REMAIN ON COUMADIN FOR PROCEDURE. NO LOVENOX BRIDGE, AS COUMADIN IS NOT BEING HELD. Patient informed of this; discussed the possibility of a second procedure if a significant lesion is noted that is not amenable to biopsy. She is well aware and agrees with this plan.

## 2013-05-04 NOTE — Telephone Encounter (Signed)
Procedure is 2/17.  Last dose of Coumadin Feb 12th.  Feb 13: Lovenox SQ in am Feb 14: Lovenox SQ in am Feb 15: Lovenox SQ in am Feb 16: Last dose of Lovenox in the am  Feb 17: PROCEDURE. No Lovenox or Coumadin. Coumadin to be resumed afternoon of procedure.

## 2013-05-04 NOTE — Telephone Encounter (Signed)
Would recommend lovenox bridging, which they can administer.

## 2013-05-05 ENCOUNTER — Encounter: Payer: Self-pay | Admitting: General Practice

## 2013-05-05 NOTE — Telephone Encounter (Signed)
Camille mailed instructions to the pt.

## 2013-05-09 NOTE — Progress Notes (Signed)
cc'd to pcp 

## 2013-05-09 NOTE — Telephone Encounter (Signed)
UPDATE: Patient to REMAIN on Coumadin for procedure. NO LOVENOX.  Discontinue Lovenox.  Yesenia [name], can we call pharmacy and make sure they DO NOT fill this.

## 2013-05-09 NOTE — Progress Notes (Signed)
Quick Note:  Hgb remaining stable in the 11 range.  Low iron at 33.  Ferritin continues to drift, now 13.  Proceed with TCS/EGD as planned ______

## 2013-05-10 NOTE — Telephone Encounter (Signed)
[place] and cancelled the rx for lovenox.

## 2013-05-11 ENCOUNTER — Encounter (HOSPITAL_COMMUNITY): Payer: Self-pay | Admitting: Pharmacy Technician

## 2013-05-23 NOTE — OR Nursing (Signed)
Pt. Called and cancelled surgery.   Mother went to Er yesterday evening and she did not do her prep. She stated she called the office and left a detailed message about cancelling

## 2013-05-24 ENCOUNTER — Ambulatory Visit: Payer: Medicare Other | Admitting: Family Medicine

## 2013-05-24 ENCOUNTER — Other Ambulatory Visit: Payer: Self-pay | Admitting: Internal Medicine

## 2013-05-24 MED ORDER — PEG 3350-KCL-NA BICARB-NACL 420 G PO SOLR: 4000.0000 mL | ORAL | Status: DC

## 2013-06-01 ENCOUNTER — Encounter (HOSPITAL_COMMUNITY): Admission: RE | Payer: Self-pay | Source: Ambulatory Visit

## 2013-06-01 ENCOUNTER — Ambulatory Visit (HOSPITAL_COMMUNITY)
Admission: RE | Admit: 2013-06-01 | Payer: PRIVATE HEALTH INSURANCE | Source: Ambulatory Visit | Admitting: Internal Medicine

## 2013-06-01 SURGERY — COLONOSCOPY WITH ESOPHAGOGASTRODUODENOSCOPY (EGD)
Anesthesia: Moderate Sedation

## 2013-06-06 ENCOUNTER — Ambulatory Visit (HOSPITAL_COMMUNITY)
Admission: RE | Admit: 2013-06-06 | Discharge: 2013-06-06 | Disposition: A | Discharge status: Home / Self Care | Payer: PRIVATE HEALTH INSURANCE | Source: Ambulatory Visit | Attending: Family Medicine | Admitting: Family Medicine

## 2013-06-06 ENCOUNTER — Encounter: Payer: Self-pay | Admitting: Family Medicine

## 2013-06-06 ENCOUNTER — Ambulatory Visit (INDEPENDENT_AMBULATORY_CARE_PROVIDER_SITE_OTHER): Payer: Medicare Other | Admitting: Family Medicine

## 2013-06-06 VITALS — BP 136/78 | HR 64 | Temp 97.8°F | Resp 12 | Ht 62.0 in | Wt 250.0 lb

## 2013-06-06 DIAGNOSIS — M79609 Pain in unspecified limb: Secondary | ICD-10-CM | POA: Insufficient documentation

## 2013-06-06 DIAGNOSIS — S99919A Unspecified injury of unspecified ankle, initial encounter: Secondary | ICD-10-CM

## 2013-06-06 DIAGNOSIS — E039 Hypothyroidism, unspecified: Secondary | ICD-10-CM

## 2013-06-06 DIAGNOSIS — S99929A Unspecified injury of unspecified foot, initial encounter: Secondary | ICD-10-CM

## 2013-06-06 DIAGNOSIS — I2699 Other pulmonary embolism without acute cor pulmonale: Secondary | ICD-10-CM

## 2013-06-06 DIAGNOSIS — F329 Major depressive disorder, single episode, unspecified: Secondary | ICD-10-CM

## 2013-06-06 DIAGNOSIS — S99922A Unspecified injury of left foot, initial encounter: Secondary | ICD-10-CM

## 2013-06-06 DIAGNOSIS — E1149 Type 2 diabetes mellitus with other diabetic neurological complication: Secondary | ICD-10-CM

## 2013-06-06 DIAGNOSIS — E119 Type 2 diabetes mellitus without complications: Secondary | ICD-10-CM

## 2013-06-06 DIAGNOSIS — F411 Generalized anxiety disorder: Secondary | ICD-10-CM

## 2013-06-06 DIAGNOSIS — S8990XA Unspecified injury of unspecified lower leg, initial encounter: Secondary | ICD-10-CM

## 2013-06-06 DIAGNOSIS — I10 Essential (primary) hypertension: Secondary | ICD-10-CM

## 2013-06-06 DIAGNOSIS — I82409 Acute embolism and thrombosis of unspecified deep veins of unspecified lower extremity: Secondary | ICD-10-CM

## 2013-06-06 DIAGNOSIS — F419 Anxiety disorder, unspecified: Secondary | ICD-10-CM

## 2013-06-06 DIAGNOSIS — E1142 Type 2 diabetes mellitus with diabetic polyneuropathy: Secondary | ICD-10-CM

## 2013-06-06 DIAGNOSIS — E1140 Type 2 diabetes mellitus with diabetic neuropathy, unspecified: Secondary | ICD-10-CM

## 2013-06-06 DIAGNOSIS — Z7901 Long term (current) use of anticoagulants: Secondary | ICD-10-CM

## 2013-06-06 LAB — COMPREHENSIVE METABOLIC PANEL
ALT: 10 U/L (ref 0–35)
AST: 17 U/L (ref 0–37)
Albumin: 3.8 g/dL (ref 3.5–5.2)
Alkaline Phosphatase: 72 U/L (ref 39–117)
BILIRUBIN TOTAL: 0.4 mg/dL (ref 0.2–1.2)
BUN: 12 mg/dL (ref 6–23)
CHLORIDE: 102 meq/L (ref 96–112)
CO2: 27 mEq/L (ref 19–32)
CREATININE: 1.02 mg/dL (ref 0.50–1.10)
Calcium: 9.6 mg/dL (ref 8.4–10.5)
Glucose, Bld: 93 mg/dL (ref 70–99)
Potassium: 3.8 mEq/L (ref 3.5–5.3)
Sodium: 139 mEq/L (ref 135–145)
Total Protein: 7.2 g/dL (ref 6.0–8.3)

## 2013-06-06 LAB — CBC WITH DIFFERENTIAL/PLATELET
BASOS ABS: 0.1 10*3/uL (ref 0.0–0.1)
Basophils Relative: 1 % (ref 0–1)
EOS ABS: 0.3 10*3/uL (ref 0.0–0.7)
Eosinophils Relative: 6 % — ABNORMAL HIGH (ref 0–5)
HCT: 36.2 % (ref 36.0–46.0)
Hemoglobin: 11.8 g/dL — ABNORMAL LOW (ref 12.0–15.0)
LYMPHS PCT: 38 % (ref 12–46)
Lymphs Abs: 1.9 10*3/uL (ref 0.7–4.0)
MCH: 26.9 pg (ref 26.0–34.0)
MCHC: 32.6 g/dL (ref 30.0–36.0)
MCV: 82.6 fL (ref 78.0–100.0)
Monocytes Absolute: 0.4 10*3/uL (ref 0.1–1.0)
Monocytes Relative: 8 % (ref 3–12)
NEUTROS PCT: 47 % (ref 43–77)
Neutro Abs: 2.4 10*3/uL (ref 1.7–7.7)
PLATELETS: 226 10*3/uL (ref 150–400)
RBC: 4.38 MIL/uL (ref 3.87–5.11)
RDW: 16.5 % — ABNORMAL HIGH (ref 11.5–15.5)
WBC: 5.1 10*3/uL (ref 4.0–10.5)

## 2013-06-06 LAB — PT WITH INR/FINGERSTICK
INR, fingerstick: 2 — ABNORMAL HIGH (ref 0.80–1.20)
PT FINGERSTICK: 23.7 s — AB (ref 10.4–12.5)

## 2013-06-06 LAB — LIPID PANEL
CHOLESTEROL: 214 mg/dL — AB (ref 0–200)
HDL: 61 mg/dL (ref >39)
LDL Cholesterol: 128 mg/dL — ABNORMAL HIGH (ref 0–99)
Total CHOL/HDL Ratio: 3.5 Ratio
Triglycerides: 123 mg/dL (ref <150)
VLDL: 25 mg/dL (ref 0–40)

## 2013-06-06 LAB — TSH: TSH: 12.211 u[IU]/mL — AB (ref 0.350–4.500)

## 2013-06-06 LAB — T4, FREE: Free T4: 0.89 ng/dL (ref 0.80–1.80)

## 2013-06-06 LAB — HEMOGLOBIN A1C, FINGERSTICK: HEMOGLOBIN A1C, FINGERSTICK: 5.8 % — AB (ref <5.7)

## 2013-06-06 LAB — T3, FREE: T3 FREE: 2.6 pg/mL (ref 2.3–4.2)

## 2013-06-06 MED ORDER — POTASSIUM CHLORIDE ER 10 MEQ PO TBCR
10.0000 meq | EXTENDED_RELEASE_TABLET | Freq: Every day | ORAL | Status: DC
Start: 2013-06-06 — End: 2013-10-10

## 2013-06-06 MED ORDER — VALSARTAN 80 MG PO TABS: 80.0000 mg | ORAL_TABLET | Freq: Every day | ORAL | Status: DC

## 2013-06-06 MED ORDER — HYDROCHLOROTHIAZIDE 25 MG PO TABS: ORAL_TABLET | ORAL | Status: DC

## 2013-06-06 MED ORDER — FUROSEMIDE 40 MG PO TABS: 40.0000 mg | ORAL_TABLET | Freq: Every day | ORAL | Status: DC | PRN

## 2013-06-06 MED ORDER — NYSTATIN 100000 UNIT/GM EX POWD: CUTANEOUS | Status: DC

## 2013-06-06 MED ORDER — PREGABALIN 50 MG PO CAPS: 50.0000 mg | ORAL_CAPSULE | Freq: Three times a day (TID) | ORAL | Status: DC

## 2013-06-06 MED ORDER — LORCASERIN HCL 10 MG PO TABS: ORAL_TABLET | ORAL | Status: DC

## 2013-06-06 MED ORDER — HYDROCODONE-ACETAMINOPHEN 10-325 MG PO TABS: 1.0000 | ORAL_TABLET | Freq: Four times a day (QID) | ORAL | Status: DC | PRN

## 2013-06-06 MED ORDER — LEVOTHYROXINE SODIUM 100 MCG PO TABS: 100.0000 ug | ORAL_TABLET | Freq: Every day | ORAL | Status: DC

## 2013-06-06 MED ORDER — WARFARIN SODIUM 5 MG PO TABS: 5.0000 mg | ORAL_TABLET | Freq: Every day | ORAL | Status: DC

## 2013-06-06 NOTE — Progress Notes (Signed)
Patient ID: Yesenia [name], female   DOB: [birth year], 63 y.o.   MRN: [id number]   Subjective:    Patient ID: Yesenia [name], female    DOB: Mar 30, 1951, 63 y.o.   MRN: [id number]  Patient presents for F/U and Check Coumadin Level  patient to follow chronic medical problems. She's due for INR check. She's currently on Coumadin for deep venous thrombosis  Diabetes mellitus is currently diet controlled her last A1c was 6%. Her blood sugars fasting have been increasing they have been in the 140s. She has gained some weight since her last visit Left ankle pain for the past 2 weeks. She was walking in her home when she tripped and hit her ankle. She initially had some swelling but then noticed bruising over the past couple of days. She also requests a refill on her pain medication.  Iron deficiency anemia her colonoscopy has been rescheduled do to her recent weather  Obesity-with her increasing weight as well as her risk factors for heart disease diabetes and hypertension she would like to drive the medication Belviq to assist with her appetite and weight loss.     Review Of Systems:  GEN- denies fatigue, fever, weight loss,weakness, recent illness HEENT- denies eye drainage, change in vision, nasal discharge, CVS- denies chest pain, palpitations RESP- denies SOB, cough, wheeze ABD- denies N/V, change in stools, abd pain GU- denies dysuria, hematuria, dribbling, incontinence MSK- + joint pain, muscle aches, injury Neuro- denies headache, dizziness, syncope, seizure activity       Objective:    BP 136/78  Pulse 64  Temp(Src) 97.8 F (36.6 C)  Resp 12  Ht 5\' 2"  (1.575 m)  Wt 250 lb (113.399 kg)  BMI 45.71 kg/m2 GEN- NAD, alert and oriented x3 HEENT- PERRL, EOMI, non injected sclera, pink conjunctiva, MMM, oropharynx clear Neck- Supple, no thyromegaly CVS- RRR, no murmur RESP-CTAB ABD-NABS,soft,NT,ND EXT- + left ankle edema - MSK- pain with ankle rotation and flexion  left foot, +squeeze test,brusing across top of foot Pulses- Radial, DP- 2+        Assessment & Plan:      Problem List Items Addressed This Visit   Type II or unspecified type diabetes mellitus without mention of complication, not stated as uncontrolled     Diet controlled, last A1C 6%    Relevant Medications      valsartan (DIOVAN) tablet   Other Relevant Orders      CBC with Differential (Completed)      Comprehensive metabolic panel (Completed)      Lipid panel (Completed)      Hemoglobin A1C, fingerstick (Completed)   PE (pulmonary embolism)   Relevant Medications      furosemide (LASIX) tablet      hydrochlorothiazide tablet      warfarin (COUMADIN) tablet   Long term (current) use of anticoagulants - Primary     INR therapuetic at 2.0, continue current coumadin level    Relevant Orders      PT with INR/Fingerstick (Completed)   Injury of left foot     Left foot injury, xray of foot to be done    Relevant Orders      DG Foot Complete Left (Completed)   Hypothyroidism     Recheck TFT     Relevant Medications      levothyroxine (SYNTHROID, LEVOTHROID) tablet   Other Relevant Orders      TSH (Completed)      T3, Free (Completed)  T4, Free (Completed)   Essential hypertension, benign     Well controlled    DVT (deep venous thrombosis)     Coumadin at goal    Diabetic neuropathy     Doing well on Lyrica       Note: This dictation was prepared with Dragon dictation along with smaller phrase technology. Any transcriptional errors that result from this process are unintentional.

## 2013-06-06 NOTE — Patient Instructions (Signed)
Pain medication refilled Get the xray of your left foot Start belviq, call if you would like full prescription  Continue current coumadin dose- go for repeat INR in 4 weeks We will call with lab results F/U 3 months

## 2013-06-06 NOTE — Assessment & Plan Note (Signed)
Pt request new psychiatrist, her old one has retired from [employer]

## 2013-06-06 NOTE — Addendum Note (Signed)
Addended by: Vic Blackbird F on: 06/06/2013 02:49 PM   Modules accepted: Orders

## 2013-06-06 NOTE — Assessment & Plan Note (Signed)
INR therapuetic at 2.0, continue current coumadin level

## 2013-06-06 NOTE — Assessment & Plan Note (Signed)
Trial of Belviq, she was given two-week coupon for free. She does not have any side effects will send a full 30 day supply which is 60 tablet

## 2013-06-06 NOTE — Assessment & Plan Note (Signed)
Well controlled 

## 2013-06-06 NOTE — Assessment & Plan Note (Signed)
Doing well on Lyrica.  

## 2013-06-06 NOTE — Assessment & Plan Note (Signed)
Coumadin at goal

## 2013-06-06 NOTE — Assessment & Plan Note (Signed)
Left foot injury, xray of foot to be done

## 2013-06-06 NOTE — Assessment & Plan Note (Signed)
Diet controlled, last A1C 6%

## 2013-06-06 NOTE — Assessment & Plan Note (Signed)
Recheck TFT 

## 2013-06-07 ENCOUNTER — Telehealth: Payer: Self-pay | Admitting: Family Medicine

## 2013-06-07 NOTE — Telephone Encounter (Signed)
Call back number is [phone] PT is calling back about her xray she had done yesterday

## 2013-06-07 NOTE — Telephone Encounter (Signed)
Call placed to patient and patient made aware of results.

## 2013-06-09 ENCOUNTER — Other Ambulatory Visit: Payer: Self-pay | Admitting: Family Medicine

## 2013-06-09 DIAGNOSIS — E039 Hypothyroidism, unspecified: Secondary | ICD-10-CM

## 2013-06-09 MED ORDER — SIMVASTATIN 20 MG PO TABS: 20.0000 mg | ORAL_TABLET | Freq: Every day | ORAL | Status: DC

## 2013-06-12 ENCOUNTER — Other Ambulatory Visit: Payer: Self-pay | Admitting: Family Medicine

## 2013-06-13 ENCOUNTER — Ambulatory Visit (INDEPENDENT_AMBULATORY_CARE_PROVIDER_SITE_OTHER): Payer: PRIVATE HEALTH INSURANCE | Admitting: *Deleted

## 2013-06-13 DIAGNOSIS — I2699 Other pulmonary embolism without acute cor pulmonale: Secondary | ICD-10-CM

## 2013-06-13 DIAGNOSIS — Z7901 Long term (current) use of anticoagulants: Secondary | ICD-10-CM

## 2013-06-13 DIAGNOSIS — I82409 Acute embolism and thrombosis of unspecified deep veins of unspecified lower extremity: Secondary | ICD-10-CM

## 2013-06-13 NOTE — Telephone Encounter (Signed)
Medication refilled per protocol. 

## 2013-06-19 ENCOUNTER — Ambulatory Visit: Payer: PRIVATE HEALTH INSURANCE | Admitting: Gastroenterology

## 2013-07-06 ENCOUNTER — Telehealth: Payer: Self-pay | Admitting: *Deleted

## 2013-07-06 NOTE — Telephone Encounter (Signed)
Received call from patient.   Requested refill on Hydrocodone.   Ok to refill??  Last office visit/ refill 06/06/2013.

## 2013-07-07 MED ORDER — HYDROCODONE-ACETAMINOPHEN 10-325 MG PO TABS: 1.0000 | ORAL_TABLET | Freq: Four times a day (QID) | ORAL | Status: DC | PRN

## 2013-07-07 NOTE — Telephone Encounter (Signed)
Prescription printed and patient made aware to come to office to pick up.  

## 2013-07-07 NOTE — Telephone Encounter (Signed)
Okay to refill? 

## 2013-07-11 ENCOUNTER — Encounter: Payer: Self-pay | Admitting: *Deleted

## 2013-07-13 ENCOUNTER — Ambulatory Visit: Payer: PRIVATE HEALTH INSURANCE | Admitting: Gastroenterology

## 2013-07-13 ENCOUNTER — Telehealth: Payer: Self-pay | Admitting: Gastroenterology

## 2013-07-13 NOTE — Telephone Encounter (Signed)
Pt was a no show

## 2013-07-13 NOTE — Telephone Encounter (Signed)
Please send letter.

## 2013-07-17 ENCOUNTER — Encounter: Payer: Self-pay | Admitting: Gastroenterology

## 2013-07-17 NOTE — Telephone Encounter (Signed)
MAILED LETTER °

## 2013-07-24 ENCOUNTER — Telehealth: Payer: Self-pay | Admitting: *Deleted

## 2013-07-24 ENCOUNTER — Other Ambulatory Visit: Payer: Self-pay | Admitting: Family Medicine

## 2013-07-24 NOTE — Telephone Encounter (Signed)
Message copied by Sheral Flow on Mon Jul 24, 2013  4:26 PM ------      Message from: Devoria Glassing      Created: Mon Jul 24, 2013 11:35 AM       Patient is calling to get her pain medication [phone] ------

## 2013-07-24 NOTE — Telephone Encounter (Signed)
Ok to refill??  Last office visit 06/06/2013.  Last refill 07/07/2013.

## 2013-07-25 NOTE — Telephone Encounter (Signed)
Medication refilled per protocol. 

## 2013-07-25 NOTE — Telephone Encounter (Signed)
Okay 

## 2013-07-26 MED ORDER — HYDROCODONE-ACETAMINOPHEN 10-325 MG PO TABS: 1.0000 | ORAL_TABLET | Freq: Four times a day (QID) | ORAL | Status: DC | PRN

## 2013-07-26 NOTE — Telephone Encounter (Signed)
Prescription printed and patient made aware to come to office to pick up.  

## 2013-09-06 ENCOUNTER — Ambulatory Visit (INDEPENDENT_AMBULATORY_CARE_PROVIDER_SITE_OTHER): Payer: Medicare Other | Admitting: Family Medicine

## 2013-09-06 ENCOUNTER — Encounter: Payer: Self-pay | Admitting: Family Medicine

## 2013-09-06 VITALS — BP 134/76 | HR 64 | Temp 97.4°F | Resp 12 | Ht 62.0 in | Wt 250.0 lb

## 2013-09-06 DIAGNOSIS — I10 Essential (primary) hypertension: Secondary | ICD-10-CM

## 2013-09-06 DIAGNOSIS — F329 Major depressive disorder, single episode, unspecified: Secondary | ICD-10-CM

## 2013-09-06 DIAGNOSIS — Z7901 Long term (current) use of anticoagulants: Secondary | ICD-10-CM

## 2013-09-06 DIAGNOSIS — E785 Hyperlipidemia, unspecified: Secondary | ICD-10-CM

## 2013-09-06 DIAGNOSIS — E039 Hypothyroidism, unspecified: Secondary | ICD-10-CM

## 2013-09-06 DIAGNOSIS — E1169 Type 2 diabetes mellitus with other specified complication: Secondary | ICD-10-CM | POA: Insufficient documentation

## 2013-09-06 LAB — PT WITH INR/FINGERSTICK
INR, fingerstick: 2.9 — ABNORMAL HIGH (ref 0.80–1.20)
PT, fingerstick: 34.4 seconds — ABNORMAL HIGH (ref 10.4–12.5)

## 2013-09-06 MED ORDER — PHENTERMINE HCL 37.5 MG PO TABS: 37.5000 mg | ORAL_TABLET | Freq: Every day | ORAL | Status: DC

## 2013-09-06 MED ORDER — HYDROCODONE-ACETAMINOPHEN 10-325 MG PO TABS: 1.0000 | ORAL_TABLET | Freq: Four times a day (QID) | ORAL | Status: DC | PRN

## 2013-09-06 NOTE — Assessment & Plan Note (Signed)
Followed by psychiatry I think that she slipped into a depression last month with concerns over her daughter's health. She's been followed by her psychiatrist continue current medications no suicidal ideations. The phentermine might actually give her more of a stimulant effect and increase her energy

## 2013-09-06 NOTE — Assessment & Plan Note (Signed)
Blood pressure well controlled

## 2013-09-06 NOTE — Progress Notes (Signed)
Patient ID: Yesenia [name], female   DOB: 1950/07/08, 63 y.o.   MRN: [id number]   Subjective:    Patient ID: Yesenia [name], female    DOB: 09-16-1950, 63 y.o.   MRN: [id number]  Patient presents for 3 month F/U and PT/INR check  patient to follow chronic medical problems. She's not follow with Coumadin clinic in about 2 months. States that she cannot get out of bed for about a month she is very down on herself she is worried about her daughter who is currently pregnant and was told that she cannot get pregnant however she is having major complications with the pregnancy and it is very high risk. She is taking her medications as prescribed by her psychiatrist there were actually some recent changes she is now on thallium twice a day.  She does request a change in her obesity medication. We did try thebELVIQ but she cannot afford even with patient assistance. She did go back on some old phentermine that she had in the past and has not had any difficulty with the medication will like to try this again.      Review Of Systems:  GEN- denies fatigue, fever, weight loss,weakness, recent illness HEENT- denies eye drainage, change in vision, nasal discharge, CVS- denies chest pain, palpitations RESP- denies SOB, cough, wheeze ABD- denies N/V, change in stools, abd pain GU- denies dysuria, hematuria, dribbling, incontinence MSK- + joint pain, muscle aches, injury Neuro- denies headache, dizziness, syncope, seizure activity       Objective:    BP 134/76  Pulse 64  Temp(Src) 97.4 F (36.3 C) (Oral)  Resp 12  Ht 5\' 2"  (1.575 m)  Wt 250 lb (113.399 kg)  BMI 45.71 kg/m2 GEN- NAD, alert and oriented x3 HEENT- PERRL, EOMI, non injected sclera, pink conjunctiva, MMM, oropharynx clear Neck- Supple, no thyromegaly CVS- RRR, no murmur RESP-CTAB EXT- No edema Psych- flat affect, not anxious appearing, good eye contact, no SI, normal speech Pulses- Radial, DP- 2+         Assessment & Plan:      Problem List Items Addressed This Visit   Morbid obesity     Will have her try phentermine and she has tolerated this in the past. She cannot afford the Belviq    Relevant Medications      ADIPEX-P 37.5 MG PO TABS   MDD (major depressive disorder)     Followed by psychiatry I think that she slipped into a depression last month with concerns over her daughter's health. She's been followed by her psychiatrist continue current medications no suicidal ideations. The phentermine might actually give her more of a stimulant effect and increase her energy    Relevant Medications      diazepam (VALIUM) 5 MG tablet   Long term (current) use of anticoagulants - Primary     INR is at goal no change the dose, she will followup with Coumadin clinic in 4 weeks    Relevant Orders      PT with INR/Fingerstick (Completed)   Hypothyroidism     Recheck thyroid function test and adjust medication    Hyperlipemia     Check direct LDL    Relevant Orders      LDL Cholesterol, Direct   Essential hypertension, benign     Blood pressure well controlled     Other Visit Diagnoses   Unspecified hypothyroidism           Note: This dictation was prepared with  Dragon dictation along with smaller Company secretary. Any transcriptional errors that result from this process are unintentional.

## 2013-09-06 NOTE — Patient Instructions (Signed)
Continue coumadin at current dose Take 1/2 tablet of phentermine once a day  Pain medication  We will call with lab results F/U 3 months

## 2013-09-06 NOTE — Assessment & Plan Note (Signed)
Will have her try phentermine and she has tolerated this in the past. She cannot afford the Belviq

## 2013-09-06 NOTE — Assessment & Plan Note (Signed)
Recheck thyroid function test and adjust medication

## 2013-09-06 NOTE — Assessment & Plan Note (Signed)
Check direct LDL 

## 2013-09-06 NOTE — Assessment & Plan Note (Signed)
INR is at goal no change the dose, she will followup with Coumadin clinic in 4 weeks

## 2013-09-07 LAB — TSH: TSH: 4.563 u[IU]/mL — ABNORMAL HIGH (ref 0.350–4.500)

## 2013-09-07 LAB — T4, FREE: Free T4: 0.9 ng/dL (ref 0.80–1.80)

## 2013-09-07 LAB — LDL CHOLESTEROL, DIRECT: Direct LDL: 131 mg/dL — ABNORMAL HIGH

## 2013-09-07 LAB — T3, FREE: T3, Free: 2.4 pg/mL (ref 2.3–4.2)

## 2013-09-11 ENCOUNTER — Other Ambulatory Visit: Payer: Self-pay | Admitting: *Deleted

## 2013-09-11 MED ORDER — SIMVASTATIN 40 MG PO TABS: 40.0000 mg | ORAL_TABLET | Freq: Every day | ORAL | Status: DC

## 2013-10-09 ENCOUNTER — Telehealth: Payer: Self-pay | Admitting: Family Medicine

## 2013-10-09 MED ORDER — HYDROCODONE-ACETAMINOPHEN 10-325 MG PO TABS: 1.0000 | ORAL_TABLET | Freq: Four times a day (QID) | ORAL | Status: DC | PRN

## 2013-10-09 NOTE — Telephone Encounter (Signed)
Prescription printed and patient made aware to come to office to pick up.  

## 2013-10-09 NOTE — Telephone Encounter (Signed)
Patient is calling for pain medication refill  [phone]

## 2013-10-09 NOTE — Telephone Encounter (Signed)
Ok to refill Hydrocodone??  Last office visit/ refill 09/06/2013.

## 2013-10-09 NOTE — Telephone Encounter (Signed)
Okay to refill? 

## 2013-10-10 ENCOUNTER — Encounter (HOSPITAL_COMMUNITY): Payer: Self-pay | Admitting: Emergency Medicine

## 2013-10-10 ENCOUNTER — Emergency Department (HOSPITAL_COMMUNITY): Payer: PRIVATE HEALTH INSURANCE

## 2013-10-10 ENCOUNTER — Other Ambulatory Visit: Payer: Self-pay

## 2013-10-10 ENCOUNTER — Emergency Department (HOSPITAL_COMMUNITY)
Admission: EM | Admit: 2013-10-10 | Discharge: 2013-10-10 | Disposition: A | Discharge status: Home / Self Care | Payer: PRIVATE HEALTH INSURANCE | Attending: Emergency Medicine | Admitting: Emergency Medicine

## 2013-10-10 DIAGNOSIS — E079 Disorder of thyroid, unspecified: Secondary | ICD-10-CM | POA: Insufficient documentation

## 2013-10-10 DIAGNOSIS — R079 Chest pain, unspecified: Secondary | ICD-10-CM | POA: Diagnosis present

## 2013-10-10 DIAGNOSIS — K219 Gastro-esophageal reflux disease without esophagitis: Secondary | ICD-10-CM | POA: Insufficient documentation

## 2013-10-10 DIAGNOSIS — M51379 Other intervertebral disc degeneration, lumbosacral region without mention of lumbar back pain or lower extremity pain: Secondary | ICD-10-CM | POA: Insufficient documentation

## 2013-10-10 DIAGNOSIS — E039 Hypothyroidism, unspecified: Secondary | ICD-10-CM | POA: Diagnosis not present

## 2013-10-10 DIAGNOSIS — Z8701 Personal history of pneumonia (recurrent): Secondary | ICD-10-CM | POA: Insufficient documentation

## 2013-10-10 DIAGNOSIS — E119 Type 2 diabetes mellitus without complications: Secondary | ICD-10-CM | POA: Diagnosis not present

## 2013-10-10 DIAGNOSIS — M810 Age-related osteoporosis without current pathological fracture: Secondary | ICD-10-CM | POA: Insufficient documentation

## 2013-10-10 DIAGNOSIS — Z86711 Personal history of pulmonary embolism: Secondary | ICD-10-CM | POA: Diagnosis not present

## 2013-10-10 DIAGNOSIS — IMO0001 Reserved for inherently not codable concepts without codable children: Secondary | ICD-10-CM | POA: Diagnosis not present

## 2013-10-10 DIAGNOSIS — Z9889 Other specified postprocedural states: Secondary | ICD-10-CM | POA: Diagnosis not present

## 2013-10-10 DIAGNOSIS — Z86718 Personal history of other venous thrombosis and embolism: Secondary | ICD-10-CM | POA: Insufficient documentation

## 2013-10-10 DIAGNOSIS — F411 Generalized anxiety disorder: Secondary | ICD-10-CM | POA: Insufficient documentation

## 2013-10-10 DIAGNOSIS — H409 Unspecified glaucoma: Secondary | ICD-10-CM | POA: Diagnosis not present

## 2013-10-10 DIAGNOSIS — M5137 Other intervertebral disc degeneration, lumbosacral region: Secondary | ICD-10-CM | POA: Diagnosis not present

## 2013-10-10 DIAGNOSIS — Z7901 Long term (current) use of anticoagulants: Secondary | ICD-10-CM | POA: Insufficient documentation

## 2013-10-10 DIAGNOSIS — Z87442 Personal history of urinary calculi: Secondary | ICD-10-CM | POA: Diagnosis not present

## 2013-10-10 DIAGNOSIS — Z79899 Other long term (current) drug therapy: Secondary | ICD-10-CM | POA: Diagnosis not present

## 2013-10-10 DIAGNOSIS — I10 Essential (primary) hypertension: Secondary | ICD-10-CM | POA: Insufficient documentation

## 2013-10-10 DIAGNOSIS — Z8669 Personal history of other diseases of the nervous system and sense organs: Secondary | ICD-10-CM | POA: Diagnosis not present

## 2013-10-10 LAB — COMPREHENSIVE METABOLIC PANEL
ALT: 9 U/L (ref 0–35)
AST: 18 U/L (ref 0–37)
Albumin: 3.4 g/dL — ABNORMAL LOW (ref 3.5–5.2)
Alkaline Phosphatase: 70 U/L (ref 39–117)
Anion gap: 10 (ref 5–15)
BUN: 13 mg/dL (ref 6–23)
CALCIUM: 10.1 mg/dL (ref 8.4–10.5)
CO2: 29 mEq/L (ref 19–32)
CREATININE: 1.05 mg/dL (ref 0.50–1.10)
Chloride: 100 mEq/L (ref 96–112)
GFR calc Af Amer: 65 mL/min — ABNORMAL LOW (ref >90)
GFR calc non Af Amer: 56 mL/min — ABNORMAL LOW (ref >90)
Glucose, Bld: 107 mg/dL — ABNORMAL HIGH (ref 70–99)
Potassium: 4 mEq/L (ref 3.7–5.3)
SODIUM: 139 meq/L (ref 137–147)
TOTAL PROTEIN: 7.2 g/dL (ref 6.0–8.3)
Total Bilirubin: 0.2 mg/dL — ABNORMAL LOW (ref 0.3–1.2)

## 2013-10-10 LAB — CBC WITH DIFFERENTIAL/PLATELET
BASOS ABS: 0 10*3/uL (ref 0.0–0.1)
Basophils Relative: 1 % (ref 0–1)
EOS PCT: 10 % — AB (ref 0–5)
Eosinophils Absolute: 0.5 10*3/uL (ref 0.0–0.7)
HCT: 36.8 % (ref 36.0–46.0)
Hemoglobin: 11.5 g/dL — ABNORMAL LOW (ref 12.0–15.0)
LYMPHS PCT: 42 % (ref 12–46)
Lymphs Abs: 2.1 10*3/uL (ref 0.7–4.0)
MCH: 26.3 pg (ref 26.0–34.0)
MCHC: 31.3 g/dL (ref 30.0–36.0)
MCV: 84.2 fL (ref 78.0–100.0)
Monocytes Absolute: 0.4 10*3/uL (ref 0.1–1.0)
Monocytes Relative: 9 % (ref 3–12)
Neutro Abs: 1.9 10*3/uL (ref 1.7–7.7)
Neutrophils Relative %: 38 % — ABNORMAL LOW (ref 43–77)
PLATELETS: 233 10*3/uL (ref 150–400)
RBC: 4.37 MIL/uL (ref 3.87–5.11)
RDW: 15.7 % — ABNORMAL HIGH (ref 11.5–15.5)
WBC: 4.9 10*3/uL (ref 4.0–10.5)

## 2013-10-10 LAB — TROPONIN I
Troponin I: <0.3 ng/mL (ref <0.30)
Troponin I: <0.3 ng/mL (ref <0.30)

## 2013-10-10 LAB — PROTIME-INR
INR: 1.42 (ref 0.00–1.49)
Prothrombin Time: 17.4 seconds — ABNORMAL HIGH (ref 11.6–15.2)

## 2013-10-10 LAB — PRO B NATRIURETIC PEPTIDE: Pro B Natriuretic peptide (BNP): 41.6 pg/mL (ref 0–125)

## 2013-10-10 MED ORDER — IOHEXOL 350 MG/ML SOLN
100.0000 mL | Freq: Once | INTRAVENOUS | Status: AC | PRN
  Administered 2013-10-10: 100 mL via INTRAVENOUS

## 2013-10-10 NOTE — ED Notes (Addendum)
Pt BIB EMS for c/o central sternal CP that radiates across both shoulders. EMS reported the pts home smells of strong bleach fumes where pt has been cleaning carpets, sofas and house. Pt reports pain started around 1300 today and has stayed constant. Has had SOB, nausea, denies vomiting. EMS gave 324 ASA PO prior to ED arrival

## 2013-10-10 NOTE — ED Notes (Signed)
MD at bedside. 
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
## 2013-10-10 NOTE — ED Notes (Signed)
Pt c/o chest pain that started around 1200 today. Pt states central chest pain that radiates to left and right arm with SOB.

## 2013-10-10 NOTE — ED Notes (Signed)
Patient verbalizes understanding of discharge instructions and follow up care with PCP. Patient ambulatory out of department at this time escorted by family.

## 2013-10-10 NOTE — Discharge Instructions (Signed)

## 2013-10-14 ENCOUNTER — Other Ambulatory Visit: Payer: Self-pay | Admitting: *Deleted

## 2013-10-14 DIAGNOSIS — I10 Essential (primary) hypertension: Secondary | ICD-10-CM

## 2013-10-14 DIAGNOSIS — E039 Hypothyroidism, unspecified: Secondary | ICD-10-CM

## 2013-10-14 DIAGNOSIS — E785 Hyperlipidemia, unspecified: Secondary | ICD-10-CM

## 2013-10-14 DIAGNOSIS — E119 Type 2 diabetes mellitus without complications: Secondary | ICD-10-CM

## 2013-11-03 ENCOUNTER — Other Ambulatory Visit: Payer: Self-pay | Admitting: Gastroenterology

## 2013-11-03 ENCOUNTER — Telehealth: Payer: Self-pay | Admitting: *Deleted

## 2013-11-03 MED ORDER — HYDROCODONE-ACETAMINOPHEN 10-325 MG PO TABS: 1.0000 | ORAL_TABLET | Freq: Four times a day (QID) | ORAL | Status: DC | PRN

## 2013-11-03 NOTE — Telephone Encounter (Signed)
Prescription printed and patient made aware to come to office to pick up.  

## 2013-11-03 NOTE — Telephone Encounter (Signed)
Okay to refill? 

## 2013-11-03 NOTE — Telephone Encounter (Signed)
Received fax from pharmacy requesting refill on Hydrocodone/ APAP.   Ok to refill??  Last office visit 09/06/2013.   Last refill 10/09/2013.

## 2013-11-06 ENCOUNTER — Other Ambulatory Visit: Payer: Self-pay | Admitting: *Deleted

## 2013-11-06 MED ORDER — RANITIDINE HCL 150 MG PO TABS: ORAL_TABLET | ORAL | Status: DC

## 2013-11-14 ENCOUNTER — Telehealth: Payer: Self-pay | Admitting: *Deleted

## 2013-11-14 ENCOUNTER — Telehealth: Payer: Self-pay | Admitting: Family Medicine

## 2013-11-14 NOTE — Telephone Encounter (Signed)
Call placed to patient.   States that she had appointments scheduled that she was not aware of on 10/10/2013. Advised that she was seen in ER on 10/10/2013 and the appointments were for the CT scans.   Verbalized understanding.

## 2013-11-14 NOTE — Telephone Encounter (Signed)
Received information from [employer], Pharmacist with [facility] that patient is not adhering to Somvastatin & Valsartan medication orders.   States that patient states she is forgetting doses at night.   Pharmacist encouraged patient to take meds with evening coumadin.   Also, pharmacist spoke with [facility] about having medications prepackaged in blister packs.

## 2013-11-14 NOTE — Telephone Encounter (Signed)
Patient calling in concern that her computer has been broke and when she got it fixed she went on her my chart account and she saw where she had missed some appointments that she know nothing about  Please call her back at [phone]

## 2013-12-05 ENCOUNTER — Telehealth: Payer: Self-pay | Admitting: *Deleted

## 2013-12-05 ENCOUNTER — Other Ambulatory Visit: Payer: Medicare Other

## 2013-12-05 DIAGNOSIS — E039 Hypothyroidism, unspecified: Secondary | ICD-10-CM

## 2013-12-05 DIAGNOSIS — E119 Type 2 diabetes mellitus without complications: Secondary | ICD-10-CM

## 2013-12-05 DIAGNOSIS — I10 Essential (primary) hypertension: Secondary | ICD-10-CM

## 2013-12-05 DIAGNOSIS — E785 Hyperlipidemia, unspecified: Secondary | ICD-10-CM

## 2013-12-05 LAB — COMPLETE METABOLIC PANEL WITH GFR
ALT: 8 U/L (ref 0–35)
AST: 16 U/L (ref 0–37)
Albumin: 3.7 g/dL (ref 3.5–5.2)
Alkaline Phosphatase: 59 U/L (ref 39–117)
BILIRUBIN TOTAL: 0.2 mg/dL (ref 0.2–1.2)
BUN: 20 mg/dL (ref 6–23)
CHLORIDE: 106 meq/L (ref 96–112)
CO2: 27 mEq/L (ref 19–32)
CREATININE: 1.06 mg/dL (ref 0.50–1.10)
Calcium: 9.8 mg/dL (ref 8.4–10.5)
GFR, EST NON AFRICAN AMERICAN: 56 mL/min — AB
GFR, Est African American: 65 mL/min
GLUCOSE: 96 mg/dL (ref 70–99)
Potassium: 4.1 mEq/L (ref 3.5–5.3)
Sodium: 143 mEq/L (ref 135–145)
Total Protein: 6.6 g/dL (ref 6.0–8.3)

## 2013-12-05 LAB — CBC WITH DIFFERENTIAL/PLATELET
Basophils Absolute: 0 10*3/uL (ref 0.0–0.1)
Basophils Relative: 0 % (ref 0–1)
EOS ABS: 0.3 10*3/uL (ref 0.0–0.7)
EOS PCT: 4 % (ref 0–5)
HEMATOCRIT: 35.2 % — AB (ref 36.0–46.0)
HEMOGLOBIN: 11 g/dL — AB (ref 12.0–15.0)
LYMPHS ABS: 1.5 10*3/uL (ref 0.7–4.0)
LYMPHS PCT: 21 % (ref 12–46)
MCH: 25.3 pg — AB (ref 26.0–34.0)
MCHC: 31.3 g/dL (ref 30.0–36.0)
MCV: 81.1 fL (ref 78.0–100.0)
MONO ABS: 0.7 10*3/uL (ref 0.1–1.0)
MONOS PCT: 9 % (ref 3–12)
Neutro Abs: 4.8 10*3/uL (ref 1.7–7.7)
Neutrophils Relative %: 66 % (ref 43–77)
Platelets: 204 10*3/uL (ref 150–400)
RBC: 4.34 MIL/uL (ref 3.87–5.11)
RDW: 16.9 % — ABNORMAL HIGH (ref 11.5–15.5)
WBC: 7.3 10*3/uL (ref 4.0–10.5)

## 2013-12-05 LAB — LIPID PANEL
Cholesterol: 118 mg/dL (ref 0–200)
HDL: 65 mg/dL (ref >39)
LDL CALC: 37 mg/dL (ref 0–99)
TRIGLYCERIDES: 78 mg/dL (ref <150)
Total CHOL/HDL Ratio: 1.8 Ratio
VLDL: 16 mg/dL (ref 0–40)

## 2013-12-05 LAB — HEMOGLOBIN A1C
HEMOGLOBIN A1C: 6.6 % — AB (ref <5.7)
Mean Plasma Glucose: 143 mg/dL — ABNORMAL HIGH (ref <117)

## 2013-12-05 LAB — TSH: TSH: 0.895 u[IU]/mL (ref 0.350–4.500)

## 2013-12-05 NOTE — Telephone Encounter (Signed)
Received fax from [facility] care with appointment information. Pt has appoiintment on Sept 28th at 9:40am. Per fax pt was notified.

## 2013-12-08 ENCOUNTER — Encounter: Payer: Self-pay | Admitting: Family Medicine

## 2013-12-08 ENCOUNTER — Ambulatory Visit (INDEPENDENT_AMBULATORY_CARE_PROVIDER_SITE_OTHER): Payer: Medicare Other | Admitting: Family Medicine

## 2013-12-08 VITALS — BP 136/64 | HR 64 | Temp 98.0°F | Resp 12 | Ht 64.0 in | Wt 257.0 lb

## 2013-12-08 DIAGNOSIS — Z7901 Long term (current) use of anticoagulants: Secondary | ICD-10-CM

## 2013-12-08 DIAGNOSIS — S90222A Contusion of left lesser toe(s) with damage to nail, initial encounter: Secondary | ICD-10-CM

## 2013-12-08 DIAGNOSIS — E119 Type 2 diabetes mellitus without complications: Secondary | ICD-10-CM

## 2013-12-08 DIAGNOSIS — S90129A Contusion of unspecified lesser toe(s) without damage to nail, initial encounter: Secondary | ICD-10-CM

## 2013-12-08 DIAGNOSIS — N182 Chronic kidney disease, stage 2 (mild): Secondary | ICD-10-CM

## 2013-12-08 DIAGNOSIS — S90229A Contusion of unspecified lesser toe(s) with damage to nail, initial encounter: Secondary | ICD-10-CM | POA: Insufficient documentation

## 2013-12-08 DIAGNOSIS — I10 Essential (primary) hypertension: Secondary | ICD-10-CM

## 2013-12-08 DIAGNOSIS — Z23 Encounter for immunization: Secondary | ICD-10-CM

## 2013-12-08 DIAGNOSIS — R791 Abnormal coagulation profile: Secondary | ICD-10-CM

## 2013-12-08 LAB — PT WITH INR/FINGERSTICK
INR, fingerstick: 3.7 — ABNORMAL HIGH (ref 0.80–1.20)
PT, fingerstick: 44 seconds — ABNORMAL HIGH (ref 10.4–12.5)

## 2013-12-08 MED ORDER — METFORMIN HCL 500 MG PO TABS: 500.0000 mg | ORAL_TABLET | Freq: Two times a day (BID) | ORAL | Status: DC

## 2013-12-08 MED ORDER — HYDROCODONE-ACETAMINOPHEN 10-325 MG PO TABS: 1.0000 | ORAL_TABLET | Freq: Four times a day (QID) | ORAL | Status: DC | PRN

## 2013-12-08 MED ORDER — WARFARIN SODIUM 5 MG PO TABS: 5.0000 mg | ORAL_TABLET | Freq: Every day | ORAL | Status: DC

## 2013-12-08 NOTE — Progress Notes (Signed)
Patient ID: Yesenia [name], female   DOB: [birth year], 63 y.o.   MRN: [id number]   Subjective:    Patient ID: Yesenia [name], female    DOB: 1950-10-03, 63 y.o.   MRN: [id number]  Patient presents for 3 month F/U and Medication Review  patient here follow chronic medical problems. She's history of diabetes hypertension chronic pain she is on Coumadin therapy secondary to DVT and pulmonary embolism unfortunately she has not had her Coumadin checked since July she has missed her cardiology appointment in her Coumadin clinic appointment.  Her fasting labs are reviewed with her her A1c dysuria to 6.6%.  Obesity she was placed on phentermine last visit however she is gaining 10 pounds she does not been watching her diet and has not been any significant exercise.  Her medications were reviewed she now has a bubble pack and she is actually missing 6 of her pills she's been out of her thyroid medicine in a couple of her blood pressure medicines for the past week and a half she states she did not notice that she was missing the pills  Also noticed that her nail was different on her left toe she states that she stumbles around at times she cannot remember she had any particular injury there has not been any pain.  Review Of Systems:  GEN- denies fatigue, fever, weight loss,weakness, recent illness HEENT- denies eye drainage, change in vision, nasal discharge, CVS- denies chest pain, palpitations RESP- denies SOB, cough, wheeze ABD- denies N/V, change in stools, abd pain GU- denies dysuria, hematuria, dribbling, incontinence MSK- +joint pain, muscle aches, injury Neuro- denies headache, dizziness, syncope, seizure activity       Objective:    BP 136/64  Pulse 64  Temp(Src) 98 F (36.7 C) (Oral)  Resp 12  Ht 5\' 4"  (1.626 m)  Wt 257 lb (116.574 kg)  BMI 44.09 kg/m2 GEN- NAD, alert and oriented x3 HEENT- PERRL, EOMI, non injected sclera, pink conjunctiva, MMM, oropharynx  clear Neck- Supple, no thyromegaly CVS- RRR, no murmur RESP-CTAB ABD-NABS,soft,NT,ND EXT- No edema Skin- Left Great toenail- ecchymosis beneath nail, NT, no erythema of toe Pulses- Radial, DP- 2+        Assessment & Plan:      Problem List Items Addressed This Visit   Long term (current) use of anticoagulants - Primary   Relevant Orders      PT with INR/Fingerstick (Completed)    Other Visit Diagnoses   Need for prophylactic vaccination and inoculation against influenza        Relevant Orders       Flu Vaccine QUAD 36+ mos IM (Completed)       Note: This dictation was prepared with Dragon dictation along with smaller phrase technology. Any transcriptional errors that result from this process are unintentional.

## 2013-12-08 NOTE — Assessment & Plan Note (Signed)
Renal disease stable

## 2013-12-08 NOTE — Assessment & Plan Note (Addendum)
Her diabetes has deteriorated she is gaining 10 pounds, but her back on metformin 500 mg once a day Urine Micro, next visit Given script for TDAP Given flu shot in office

## 2013-12-08 NOTE — Patient Instructions (Signed)
We will call pharmacy to fix your medications Keep appointment with cardiology Flu shot given  Try to get TDAP at pharmacy Do not take Coumadin for next 2 days, then resume 5mg  once a day On Sunday Return in 1 week for repeat lab CAP program - [phone]

## 2013-12-08 NOTE — Assessment & Plan Note (Signed)
Blood pressure looks okay she needs to restart her blood pressure medications we'll contact the pharmacy to get everything straightened out with her medication this vessel

## 2013-12-08 NOTE — Assessment & Plan Note (Signed)
Hold Comadin x 2 days, then restart 5 mg once a day on Sunday. Discussed the importance of her taking her Coumadin oand getting it checked on a regular basis she will followup with me in one week for Coumadin recheck as an appointment with cardiology the end of the month she was on this for DVT and PE hopefully she will be able to eat taken off of this as she has had significant treatment

## 2013-12-13 ENCOUNTER — Telehealth: Payer: Self-pay | Admitting: *Deleted

## 2013-12-13 MED ORDER — PREGABALIN 50 MG PO CAPS: 50.0000 mg | ORAL_CAPSULE | Freq: Three times a day (TID) | ORAL | Status: DC

## 2013-12-13 NOTE — Telephone Encounter (Signed)
Okay to refill? 

## 2013-12-13 NOTE — Telephone Encounter (Signed)
Medication called to pharmacy. 

## 2013-12-13 NOTE — Telephone Encounter (Signed)
Received fax requesting refill on Lyrica.  Ok to refill??  Last office visit 12/08/2013.  Last refill 06/06/2013, #3 refills.

## 2013-12-15 ENCOUNTER — Ambulatory Visit: Payer: Medicare Other | Admitting: Family Medicine

## 2014-01-01 ENCOUNTER — Encounter: Payer: Self-pay | Admitting: Cardiovascular Disease

## 2014-01-01 ENCOUNTER — Ambulatory Visit (INDEPENDENT_AMBULATORY_CARE_PROVIDER_SITE_OTHER): Payer: PRIVATE HEALTH INSURANCE | Admitting: Cardiovascular Disease

## 2014-01-01 VITALS — BP 96/67 | HR 93 | Ht 62.0 in | Wt 254.0 lb

## 2014-01-01 DIAGNOSIS — I2699 Other pulmonary embolism without acute cor pulmonale: Secondary | ICD-10-CM | POA: Diagnosis not present

## 2014-01-01 DIAGNOSIS — Z91199 Patient's noncompliance with other medical treatment and regimen due to unspecified reason: Secondary | ICD-10-CM

## 2014-01-01 DIAGNOSIS — Z9119 Patient's noncompliance with other medical treatment and regimen: Secondary | ICD-10-CM

## 2014-01-01 DIAGNOSIS — E119 Type 2 diabetes mellitus without complications: Secondary | ICD-10-CM

## 2014-01-01 DIAGNOSIS — I82409 Acute embolism and thrombosis of unspecified deep veins of unspecified lower extremity: Secondary | ICD-10-CM | POA: Diagnosis not present

## 2014-01-01 DIAGNOSIS — E1165 Type 2 diabetes mellitus with hyperglycemia: Secondary | ICD-10-CM

## 2014-01-01 DIAGNOSIS — I517 Cardiomegaly: Secondary | ICD-10-CM | POA: Diagnosis not present

## 2014-01-01 DIAGNOSIS — I10 Essential (primary) hypertension: Secondary | ICD-10-CM

## 2014-01-01 DIAGNOSIS — Z9114 Patient's other noncompliance with medication regimen: Secondary | ICD-10-CM

## 2014-01-01 DIAGNOSIS — Z7189 Other specified counseling: Secondary | ICD-10-CM

## 2014-01-01 MED ORDER — RIVAROXABAN 20 MG PO TABS: 20.0000 mg | ORAL_TABLET | Freq: Every day | ORAL | Status: DC
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
## 2014-01-01 NOTE — Patient Instructions (Addendum)
   Stop Coumadin tonight.    May begin Xarelto 20mg  daily with evening meal on Thursday, 01/04/2014 (#10 tabs samples given & RX sent to [facility]).   Continue all other medications.   Follow up in 1 month with anticoagulation nurse Your physician wants you to follow up in:  1 year.  You will receive a reminder letter in the mail one-two months in advance.  If you don't receive a letter, please call our office to schedule the follow up appointment

## 2014-01-10 ENCOUNTER — Telehealth: Payer: Self-pay | Admitting: Family Medicine

## 2014-01-10 NOTE — Telephone Encounter (Signed)
Patient says her nail is peeling off from left to right would like to know what to do about this  [phone]

## 2014-01-10 NOTE — Telephone Encounter (Signed)
Call placed to patient.   Reports that she was seen in September and MD noted that L great toe nail had bruising or blood under the nail.   States that now nail is beginning to pull away from nail bed from the L side to the R side and has dislodged about 2/3 of the way.  MD made aware and states that she should keep the nail bed clean and dry. Do not try to remove nail by self d/t anti-coagulation therapy. Advised to use Epsom salt soak for approximately 47min and then pat dry. Wrap toe with bandage to keep debris out of the nail bed and to decrease friction on nail.   Advised to F/U  With podiatrist or with office.   Patient made aware and states that she would prefer to F/U with MD.   Appointment scheduled.

## 2014-01-12 ENCOUNTER — Ambulatory Visit (INDEPENDENT_AMBULATORY_CARE_PROVIDER_SITE_OTHER): Payer: Medicare Other | Admitting: Family Medicine

## 2014-01-12 ENCOUNTER — Encounter: Payer: Self-pay | Admitting: Family Medicine

## 2014-01-12 VITALS — BP 130/78 | HR 68 | Temp 97.8°F | Resp 14 | Ht 62.0 in | Wt 258.0 lb

## 2014-01-12 DIAGNOSIS — S91209A Unspecified open wound of unspecified toe(s) with damage to nail, initial encounter: Secondary | ICD-10-CM

## 2014-01-12 MED ORDER — BLOOD GLUCOSE METER KIT: PACK | Status: DC

## 2014-01-12 MED ORDER — HYDROCODONE-ACETAMINOPHEN 10-325 MG PO TABS: 1.0000 | ORAL_TABLET | Freq: Four times a day (QID) | ORAL | Status: DC | PRN

## 2014-01-12 NOTE — Patient Instructions (Signed)
Keep bandage on for 24 hours, then remove and soak in epsom salt  Call for signs of infection F/u as previous

## 2014-01-14 NOTE — Progress Notes (Signed)
Patient ID: Yesenia [name], female   DOB: 1950/10/25, 63 y.o.   MRN: [id number]   Subjective:    Patient ID: Yesenia [name], female    DOB: May 18, 1950, 63 y.o.   MRN: [id number]  Patient presents for Assess L Great Toe Nail  Pt here to due to left great toe nail avulsion. Had bruising beneath nail noticed at last visit she does not remember traumatic event but stumps toe all the time, this week nail began to pull aprt from nailbed, no drainage, mild discomfort,. Request refill on pain meds    Review Of Systems:  GEN- denies fatigue, fever, weight loss,weakness, recent illness MSK- + joint pain, muscle aches, injury        Objective:    BP 130/78  Pulse 68  Temp(Src) 97.8 F (36.6 C) (Oral)  Resp 14  Ht 5\' 2"  (1.575 m)  Wt 258 lb (117.028 kg)  BMI 47.18 kg/m2 GEN- NAD, alert and oriented x3 Ext- trace edema Skin- Left great toenail- partial avulsion, attached at inferior bed on medial aspect of nail, dry blood beneath nail  Procedure- Partial Nail removal Procedure explained to patient questions answered benefits and risks discussed verbal consent obtained. Antiseptic-betadine Anesthesia-lidocaine 1% Nail split in middle, avulsion portion on medial aspect removed entirely.  Minimal blood loss, vaseline gauze applied, pressure dressing Patient tolerated procedure well Bandage applied         Assessment & Plan:      Problem List Items Addressed This Visit   None    Visit Diagnoses   Nail avulsion of toe, initial encounter    -  Primary    s/p partial removal and most of nail was already displaced from bed. No signs of infection. pain meds refilled, wound care instructions given       Note: This dictation was prepared with Dragon dictation along with smaller phrase technology. Any transcriptional errors that result from this process are unintentional.

## 2014-01-16 ENCOUNTER — Other Ambulatory Visit: Payer: Self-pay | Admitting: *Deleted

## 2014-01-16 MED ORDER — RIVAROXABAN 20 MG PO TABS: 20.0000 mg | ORAL_TABLET | Freq: Every day | ORAL | Status: DC

## 2014-01-30 ENCOUNTER — Ambulatory Visit (INDEPENDENT_AMBULATORY_CARE_PROVIDER_SITE_OTHER): Payer: PRIVATE HEALTH INSURANCE | Admitting: *Deleted

## 2014-01-30 DIAGNOSIS — I2699 Other pulmonary embolism without acute cor pulmonale: Secondary | ICD-10-CM

## 2014-01-30 DIAGNOSIS — I82409 Acute embolism and thrombosis of unspecified deep veins of unspecified lower extremity: Secondary | ICD-10-CM

## 2014-01-30 NOTE — Patient Instructions (Addendum)
Pt was started on Xarelto 20mg  daily by Dr Bronson Ing for DVT,PE on 01/01/14 after being noncompliant on coumadin.    Reviewed patients medication list.  Pt is not currently on any combined P-gp and strong CYP3A4 inhibitors/inducers (ketoconazole, traconazole, ritonavir, carbamazepine, phenytoin, rifampin, St. John's wort).  Reviewed labs from 01/31/14:  SCr 1.03 , Weight 255.4, CrCl 102.24 .  Dose is appropriate based on CrCl.   Hgb and HCT:  11.5/36.7  A full discussion of the nature of anticoagulants has been carried out.  A benefit/risk analysis has been presented to the patient, so that they understand the justification for choosing anticoagulation with Xarelto at this time.  The need for compliance is stressed.  Pt is aware to take the medication once daily with the largest meal of the day.  Side effects of potential bleeding are discussed, including unusual colored urine or stools, coughing up blood or coffee ground emesis, nose bleeds or serious fall or head trauma.  Discussed signs and symptoms of stroke. The patient should avoid any OTC items containing aspirin or ibuprofen.  Avoid alcohol consumption.   Call if any signs of abnormal bleeding.  Discussed financial obligations and resolved any difficulty in obtaining medication.  Next lab test test in 5 months.  Xarelto can be discontinued at that time per Dr Bronson Ing.

## 2014-02-21 ENCOUNTER — Telehealth: Payer: Self-pay | Admitting: *Deleted

## 2014-02-21 ENCOUNTER — Other Ambulatory Visit: Payer: Self-pay | Admitting: Family Medicine

## 2014-02-21 DIAGNOSIS — Z1231 Encounter for screening mammogram for malignant neoplasm of breast: Secondary | ICD-10-CM

## 2014-02-21 NOTE — Telephone Encounter (Signed)
-----   Message from Roney Marion sent at 02/21/2014 12:45 PM EST ----- Regarding: mammogram [place]  Patient needs mammogram.  Thank you Larene Beach

## 2014-02-21 NOTE — Telephone Encounter (Signed)
Order placed for Mammogram.

## 2014-02-26 ENCOUNTER — Encounter: Payer: Self-pay | Admitting: *Deleted

## 2014-02-27 ENCOUNTER — Encounter: Payer: Self-pay | Admitting: Family Medicine

## 2014-02-27 ENCOUNTER — Ambulatory Visit (INDEPENDENT_AMBULATORY_CARE_PROVIDER_SITE_OTHER): Payer: Medicare Other | Admitting: Family Medicine

## 2014-02-27 VITALS — BP 130/76 | HR 76 | Temp 97.6°F | Resp 12 | Ht 63.0 in | Wt 258.0 lb

## 2014-02-27 DIAGNOSIS — B029 Zoster without complications: Secondary | ICD-10-CM

## 2014-02-27 MED ORDER — VALACYCLOVIR HCL 1 G PO TABS: 1000.0000 mg | ORAL_TABLET | Freq: Three times a day (TID) | ORAL | Status: DC

## 2014-02-27 MED ORDER — HYDROCODONE-ACETAMINOPHEN 10-325 MG PO TABS: 1.0000 | ORAL_TABLET | Freq: Four times a day (QID) | ORAL | Status: DC | PRN

## 2014-02-27 NOTE — Progress Notes (Signed)
Patient ID: Yesenia [name], female   DOB: [birth year], 63 y.o.   MRN: [id number]   Subjective:    Patient ID: Yesenia [name], female    DOB: 12-18-1950, 63 y.o.   MRN: [id number]  Patient presents for Rash and F/U Nail Removal  patient here with rashes started in the middle the night. She's had a painful bumpy rash became up on her right side she states that it tingles and is painful and not itchy. She's not had any fever or other recent illness. There've been no significant change her medication. She noticed over the past couple hours it has been getting redder and spreading    Review Of Systems:  GEN- denies fatigue, fever, weight loss,weakness, recent illness HEENT- denies eye drainage, change in vision, nasal discharge, CVS- denies chest pain, palpitations RESP- denies SOB, cough, wheeze ABD- denies N/V, change in stools, abd pain GU- denies dysuria, hematuria, dribbling, incontinence MSK- denies joint pain, muscle aches, injury Neuro- denies headache, dizziness, syncope, seizure activity       Objective:    BP 130/76 mmHg  Pulse 76  Temp(Src) 97.6 F (36.4 C) (Oral)  Resp 12  Ht 5\' 3"  (1.6 m)  Wt 258 lb (117.028 kg)  BMI 45.71 kg/m2 GEN- NAD, alert and oriented x3 Skin- maculopapular lesions linear fashion right abdominal side wall toward flank, TTP, no warmth, no veiscules noted, no pustules Left great-toenail- growth noted at inferior nail plate, nail clear,        Assessment & Plan:      Problem List Items Addressed This Visit    None    Visit Diagnoses    Shingles    -  Primary    Concenred for shingles based on appearance and pain with rash, Treat with valtex 1gram TID x 7 days, pain meds refilled    Relevant Medications       valACYclovir (VALTREX) tablet       Note: This dictation was prepared with Dragon dictation along with smaller phrase technology. Any transcriptional errors that result from this process are unintentional.

## 2014-02-27 NOTE — Patient Instructions (Signed)
Start shingles medication Get the medication as prescribed

## 2014-03-14 ENCOUNTER — Other Ambulatory Visit: Payer: Self-pay | Admitting: Family Medicine

## 2014-03-14 ENCOUNTER — Telehealth: Payer: Self-pay | Admitting: *Deleted

## 2014-03-14 MED ORDER — PREGABALIN 50 MG PO CAPS
50.0000 mg | ORAL_CAPSULE | Freq: Three times a day (TID) | ORAL | Status: DC
Start: 2014-03-14 — End: 2014-03-16

## 2014-03-14 NOTE — Telephone Encounter (Addendum)
Received fax requesting refill on Lyrica.   Ok to refill??  Last office visit 02/27/2014.  Last refill 12/13/2013, #3 refills.

## 2014-03-14 NOTE — Telephone Encounter (Signed)
Okay to refill? 

## 2014-03-14 NOTE — Telephone Encounter (Signed)
Refill appropriate and filled per protocol. 

## 2014-03-14 NOTE — Telephone Encounter (Signed)
Medication called to pharmacy. 

## 2014-03-16 ENCOUNTER — Encounter: Payer: Self-pay | Admitting: Family Medicine

## 2014-03-16 ENCOUNTER — Ambulatory Visit (INDEPENDENT_AMBULATORY_CARE_PROVIDER_SITE_OTHER): Payer: Medicare Other | Admitting: Family Medicine

## 2014-03-16 VITALS — BP 128/72 | HR 78 | Temp 98.5°F | Resp 16 | Ht 63.0 in | Wt 261.0 lb

## 2014-03-16 DIAGNOSIS — E1143 Type 2 diabetes mellitus with diabetic autonomic (poly)neuropathy: Secondary | ICD-10-CM

## 2014-03-16 DIAGNOSIS — M4806 Spinal stenosis, lumbar region: Secondary | ICD-10-CM

## 2014-03-16 DIAGNOSIS — M5136 Other intervertebral disc degeneration, lumbar region: Secondary | ICD-10-CM

## 2014-03-16 DIAGNOSIS — B0229 Other postherpetic nervous system involvement: Secondary | ICD-10-CM

## 2014-03-16 DIAGNOSIS — E038 Other specified hypothyroidism: Secondary | ICD-10-CM

## 2014-03-16 DIAGNOSIS — E1149 Type 2 diabetes mellitus with other diabetic neurological complication: Secondary | ICD-10-CM

## 2014-03-16 DIAGNOSIS — N182 Chronic kidney disease, stage 2 (mild): Secondary | ICD-10-CM

## 2014-03-16 DIAGNOSIS — E1140 Type 2 diabetes mellitus with diabetic neuropathy, unspecified: Secondary | ICD-10-CM

## 2014-03-16 DIAGNOSIS — M48061 Spinal stenosis, lumbar region without neurogenic claudication: Secondary | ICD-10-CM

## 2014-03-16 LAB — CBC WITH DIFFERENTIAL/PLATELET
BASOS ABS: 0 10*3/uL (ref 0.0–0.1)
BASOS PCT: 1 % (ref 0–1)
Eosinophils Absolute: 0.3 10*3/uL (ref 0.0–0.7)
Eosinophils Relative: 8 % — ABNORMAL HIGH (ref 0–5)
HCT: 34.9 % — ABNORMAL LOW (ref 36.0–46.0)
Hemoglobin: 11.1 g/dL — ABNORMAL LOW (ref 12.0–15.0)
Lymphocytes Relative: 46 % (ref 12–46)
Lymphs Abs: 1.5 10*3/uL (ref 0.7–4.0)
MCH: 25.3 pg — ABNORMAL LOW (ref 26.0–34.0)
MCHC: 31.8 g/dL (ref 30.0–36.0)
MCV: 79.7 fL (ref 78.0–100.0)
MPV: 9.8 fL (ref 9.4–12.4)
Monocytes Absolute: 0.4 10*3/uL (ref 0.1–1.0)
Monocytes Relative: 13 % — ABNORMAL HIGH (ref 3–12)
NEUTROS ABS: 1.1 10*3/uL — AB (ref 1.7–7.7)
Neutrophils Relative %: 32 % — ABNORMAL LOW (ref 43–77)
PLATELETS: 218 10*3/uL (ref 150–400)
RBC: 4.38 MIL/uL (ref 3.87–5.11)
RDW: 16.9 % — AB (ref 11.5–15.5)
WBC: 3.3 10*3/uL — AB (ref 4.0–10.5)

## 2014-03-16 LAB — BASIC METABOLIC PANEL
BUN: 13 mg/dL (ref 6–23)
CALCIUM: 9.2 mg/dL (ref 8.4–10.5)
CO2: 29 mEq/L (ref 19–32)
CREATININE: 1.04 mg/dL (ref 0.50–1.10)
Chloride: 103 mEq/L (ref 96–112)
GLUCOSE: 105 mg/dL — AB (ref 70–99)
Potassium: 4.2 mEq/L (ref 3.5–5.3)
Sodium: 139 mEq/L (ref 135–145)

## 2014-03-16 LAB — HEMOGLOBIN A1C
HEMOGLOBIN A1C: 6.4 % — AB (ref <5.7)
Mean Plasma Glucose: 137 mg/dL — ABNORMAL HIGH (ref <117)

## 2014-03-16 MED ORDER — HYDROCODONE-ACETAMINOPHEN 10-325 MG PO TABS: 1.0000 | ORAL_TABLET | Freq: Four times a day (QID) | ORAL | Status: DC | PRN

## 2014-03-16 MED ORDER — PREGABALIN 75 MG PO CAPS: 75.0000 mg | ORAL_CAPSULE | Freq: Three times a day (TID) | ORAL | Status: DC

## 2014-03-16 NOTE — Assessment & Plan Note (Signed)
History of lumbar surgery, chronic back pain on Lyrica and hydrocodone. I will send her to a pain clinic I'm not sure if an epidural injection would help or not she overall just needs better pain control.

## 2014-03-16 NOTE — Patient Instructions (Signed)
Lyrica increase to 75mg  TID  Pain meds given for December Referral to Dr. Lyla Son We will call with lab results F/U 3 months

## 2014-03-16 NOTE — Assessment & Plan Note (Signed)
Increase lyrica to 75mg  TID

## 2014-03-16 NOTE — Progress Notes (Signed)
Patient ID: Yesenia [name], female   DOB: [birth year], 63 y.o.   MRN: [id number]   Subjective:    Patient ID: Yesenia [name], female    DOB: February 14, 1951, 63 y.o.   MRN: [id number]  Patient presents for 3 month F/U and Shingles Pain  patient in follow-up chronic medical problems. She was diagnosed with shingles about 2 weeks ago the rash is now dried up and she has a lot of tingling and burning pain with the rash produced he was as well as down her right leg. She also continues to have back pain with radicular symptoms. She will like to be referred to a pain clinic to try an epidural injection. She's currently on Lyrica 50 mg 3 times a day as well as hydrocodone. History of back surgery in past with chronic pain  She was started on metformin at her previous visits secondary to A1c increasing she states that her fasting sugars are now in about 115 to 120    Review Of Systems:  GEN- denies fatigue, fever, weight loss,weakness, recent illness HEENT- denies eye drainage, change in vision, nasal discharge, CVS- denies chest pain, palpitations RESP- denies SOB, cough, wheeze ABD- denies N/V, change in stools, abd pain GU- denies dysuria, hematuria, dribbling, incontinence MSK- +joint pain, +muscle aches, injury Neuro- denies headache, dizziness, syncope, seizure activity       Objective:    BP 128/72 mmHg  Pulse 78  Temp(Src) 98.5 F (36.9 C) (Oral)  Resp 16  Ht 5\' 3"  (1.6 m)  Wt 261 lb (118.389 kg)  BMI 46.25 kg/m2 GEN- NAD, alert and oriented x3 HEENT- PERRL, EOMI, non injected sclera, pink conjunctiva, MMM, oropharynx clear Neck- Supple, no thyromegaly CVS- RRR, no murmur RESP-CTAB Skin- scab previous shingles rash, TTP over area, no indurated lesions MSK- TTP lumbar spine, decreased ROM spine, Strength 4+/5 RLE compared to left  EXT- No edema Pulses- Radial, DP- 2+        Assessment & Plan:      Problem List Items Addressed This Visit    None      Note:  This dictation was prepared with Dragon dictation along with smaller phrase technology. Any transcriptional errors that result from this process are unintentional.

## 2014-03-16 NOTE — Assessment & Plan Note (Signed)
Improved fasting CBG, recheck A1C

## 2014-04-02 ENCOUNTER — Other Ambulatory Visit: Payer: Self-pay | Admitting: Family Medicine

## 2014-04-12 ENCOUNTER — Other Ambulatory Visit: Payer: Self-pay | Admitting: Family Medicine

## 2014-04-12 NOTE — Telephone Encounter (Signed)
Refill appropriate and filled per protocol. 

## 2014-04-17 ENCOUNTER — Other Ambulatory Visit: Payer: Self-pay | Admitting: Family Medicine

## 2014-04-18 NOTE — Telephone Encounter (Signed)
Refill appropriate and filled per protocol. 

## 2014-05-07 ENCOUNTER — Telehealth: Payer: Self-pay | Admitting: Family Medicine

## 2014-05-07 MED ORDER — HYDROCODONE-ACETAMINOPHEN 10-325 MG PO TABS: 1.0000 | ORAL_TABLET | Freq: Four times a day (QID) | ORAL | Status: DC | PRN

## 2014-05-07 NOTE — Telephone Encounter (Signed)
Prescription printed and patient made aware to come to office to pick up.  

## 2014-05-07 NOTE — Telephone Encounter (Signed)
Okay to refill? 

## 2014-05-07 NOTE — Telephone Encounter (Signed)
Ok to refill??  Last office visit/ refill 03/16/2014.

## 2014-05-07 NOTE — Telephone Encounter (Signed)
[phone]  PT is needing a refill on HYDROcodone-acetaminophen (NORCO) 10-325 MG per tablet

## 2014-05-11 ENCOUNTER — Other Ambulatory Visit: Payer: Self-pay | Admitting: Family Medicine

## 2014-05-11 NOTE — Telephone Encounter (Signed)
Refill is too early for Lyrica. Patient just had filled on 05/11/2014.  Refill for Metformin sent.

## 2014-06-12 ENCOUNTER — Other Ambulatory Visit: Payer: Self-pay | Admitting: Family Medicine

## 2014-06-12 NOTE — Telephone Encounter (Signed)
Refill appropriate and filled per protocol. 

## 2014-06-13 ENCOUNTER — Other Ambulatory Visit: Payer: Self-pay | Admitting: Family Medicine

## 2014-06-13 NOTE — Telephone Encounter (Signed)
Okay to refill? 

## 2014-06-13 NOTE — Telephone Encounter (Signed)
Ok to refill??  Last office visit/ refill 03/16/2014, #2 refills.

## 2014-06-13 NOTE — Telephone Encounter (Signed)
Medication called to pharmacy. 

## 2014-06-22 ENCOUNTER — Encounter: Payer: Self-pay | Admitting: Family Medicine

## 2014-06-22 ENCOUNTER — Ambulatory Visit (INDEPENDENT_AMBULATORY_CARE_PROVIDER_SITE_OTHER): Payer: Medicare Other | Admitting: Family Medicine

## 2014-06-22 VITALS — BP 126/74 | HR 76 | Temp 97.7°F | Resp 16 | Ht 63.0 in | Wt 262.0 lb

## 2014-06-22 DIAGNOSIS — N182 Chronic kidney disease, stage 2 (mild): Secondary | ICD-10-CM | POA: Diagnosis not present

## 2014-06-22 DIAGNOSIS — R05 Cough: Secondary | ICD-10-CM

## 2014-06-22 DIAGNOSIS — M5136 Other intervertebral disc degeneration, lumbar region: Secondary | ICD-10-CM | POA: Diagnosis not present

## 2014-06-22 DIAGNOSIS — E1140 Type 2 diabetes mellitus with diabetic neuropathy, unspecified: Secondary | ICD-10-CM

## 2014-06-22 DIAGNOSIS — E785 Hyperlipidemia, unspecified: Secondary | ICD-10-CM | POA: Diagnosis not present

## 2014-06-22 DIAGNOSIS — R058 Other specified cough: Secondary | ICD-10-CM

## 2014-06-22 DIAGNOSIS — I10 Essential (primary) hypertension: Secondary | ICD-10-CM | POA: Diagnosis not present

## 2014-06-22 DIAGNOSIS — E1149 Type 2 diabetes mellitus with other diabetic neurological complication: Secondary | ICD-10-CM

## 2014-06-22 DIAGNOSIS — E1143 Type 2 diabetes mellitus with diabetic autonomic (poly)neuropathy: Secondary | ICD-10-CM

## 2014-06-22 DIAGNOSIS — E038 Other specified hypothyroidism: Secondary | ICD-10-CM | POA: Diagnosis not present

## 2014-06-22 LAB — COMPREHENSIVE METABOLIC PANEL
ALT: 8 U/L (ref 0–35)
AST: 14 U/L (ref 0–37)
Albumin: 3.6 g/dL (ref 3.5–5.2)
Alkaline Phosphatase: 78 U/L (ref 39–117)
BUN: 12 mg/dL (ref 6–23)
CALCIUM: 9.4 mg/dL (ref 8.4–10.5)
CHLORIDE: 103 meq/L (ref 96–112)
CO2: 24 meq/L (ref 19–32)
CREATININE: 0.8 mg/dL (ref 0.50–1.10)
Glucose, Bld: 96 mg/dL (ref 70–99)
Potassium: 4.4 mEq/L (ref 3.5–5.3)
Sodium: 140 mEq/L (ref 135–145)
TOTAL PROTEIN: 7 g/dL (ref 6.0–8.3)
Total Bilirubin: 0.2 mg/dL (ref 0.2–1.2)

## 2014-06-22 LAB — CBC WITH DIFFERENTIAL/PLATELET
BASOS PCT: 1 % (ref 0–1)
Basophils Absolute: 0 10*3/uL (ref 0.0–0.1)
Eosinophils Absolute: 0.3 10*3/uL (ref 0.0–0.7)
Eosinophils Relative: 7 % — ABNORMAL HIGH (ref 0–5)
HEMATOCRIT: 34.9 % — AB (ref 36.0–46.0)
Hemoglobin: 10.7 g/dL — ABNORMAL LOW (ref 12.0–15.0)
Lymphocytes Relative: 42 % (ref 12–46)
Lymphs Abs: 2 10*3/uL (ref 0.7–4.0)
MCH: 25.1 pg — ABNORMAL LOW (ref 26.0–34.0)
MCHC: 30.7 g/dL (ref 30.0–36.0)
MCV: 81.7 fL (ref 78.0–100.0)
MPV: 9.5 fL (ref 8.6–12.4)
Monocytes Absolute: 0.3 10*3/uL (ref 0.1–1.0)
Monocytes Relative: 7 % (ref 3–12)
NEUTROS ABS: 2 10*3/uL (ref 1.7–7.7)
NEUTROS PCT: 43 % (ref 43–77)
PLATELETS: 291 10*3/uL (ref 150–400)
RBC: 4.27 MIL/uL (ref 3.87–5.11)
RDW: 15.8 % — AB (ref 11.5–15.5)
WBC: 4.7 10*3/uL (ref 4.0–10.5)

## 2014-06-22 LAB — LIPID PANEL
Cholesterol: 148 mg/dL (ref 0–200)
HDL: 50 mg/dL (ref >=46)
LDL CALC: 72 mg/dL (ref 0–99)
Total CHOL/HDL Ratio: 3 Ratio
Triglycerides: 132 mg/dL (ref <150)
VLDL: 26 mg/dL (ref 0–40)

## 2014-06-22 LAB — HEMOGLOBIN A1C
Hgb A1c MFr Bld: 6.3 % — ABNORMAL HIGH (ref <5.7)
MEAN PLASMA GLUCOSE: 134 mg/dL — AB (ref <117)

## 2014-06-22 LAB — TSH: TSH: 2.719 u[IU]/mL (ref 0.350–4.500)

## 2014-06-22 MED ORDER — PHENTERMINE HCL 37.5 MG PO TABS: 37.5000 mg | ORAL_TABLET | Freq: Every day | ORAL | Status: DC

## 2014-06-22 MED ORDER — HYDROCODONE-ACETAMINOPHEN 10-325 MG PO TABS: 1.0000 | ORAL_TABLET | Freq: Four times a day (QID) | ORAL | Status: DC | PRN

## 2014-06-22 NOTE — Progress Notes (Signed)
Patient ID: Yesenia [name], female   DOB: September 10, 1950, 64 y.o.   MRN: [id number]   Subjective:    Patient ID: Yesenia [name], female    DOB: [birth year], 64 y.o.   MRN: [id number]  Patient presents for 3 month F/U  patient in follow-up chronic medical problems. She is concerned about her weight she tends to overeat in the afternoons and skips breakfast. She states that she stress eats. She's been trying to cut back but has not had any significant weight loss. She wants to go back on the phentermine I had her on this back in 2014 and she lost about 30 pounds but then started to overeat in she was not following her diet anymore therefore I discontinued it as she had gained weight.  Diabetes mellitus her blood sugars have been less than 120 fasting she has not had any hypoglycemia symptoms.  She also complains of a mostly dry cough that has been going on for the past few months it is worse at night times feel more like a hacking cough she does get occasionally during the day. She does not feel sick. She denies any acid reflux symptoms. She denies any wheezing or shortness of breath associated  Medications reviewed she has follow-up with her cardiologist same, she also has establishing  visit with Dr. Francesco Runner for pain management    Review Of Systems:  GEN- denies fatigue, fever, weight loss,weakness, recent illness HEENT- denies eye drainage, change in vision, nasal discharge, CVS- denies chest pain, palpitations RESP- denies SOB, +cough, wheeze ABD- denies N/V, change in stools, abd pain GU- denies dysuria, hematuria, dribbling, incontinence MSK- + joint pain, muscle aches, injury Neuro- denies headache, dizziness, syncope, seizure activity       Objective:    BP 126/74 mmHg  Pulse 76  Temp(Src) 97.7 F (36.5 C) (Oral)  Resp 16  Ht 5\' 3"  (1.6 m)  Wt 262 lb (118.842 kg)  BMI 46.42 kg/m2 GEN- NAD, alert and oriented x3 HEENT- PERRL, EOMI, non injected sclera, pink  conjunctiva, MMM, oropharynx clear, nares clear Neck- Supple, no thyromegaly CVS- RRR, no murmur RESP-CTAB EXT- pedal edema Pulses- Radial, DP- 2+        Assessment & Plan:      Problem List Items Addressed This Visit      Unprioritized   Type II diabetes mellitus with neurological manifestations    Recheck A1c her diabetes has been well-controlled. Regarding her dry cough I'm that I have her stop her Diovan for the next 2 weeks to see if this helps her cough      Relevant Orders   CBC with Differential/Platelet   Comprehensive metabolic panel   Hemoglobin A1c   HM DIABETES FOOT EXAM (Completed)   Microalbumin / creatinine urine ratio   Morbid obesity    We'll start her on phentermine again discussed a low-fat low carb diet which she has done in the past and had good results with. She will follow up in 2 months for weight check she will start with one half tablet to minimize any side effects of nausea vomiting headache dizziness, then move up to one full tablet after one week      Relevant Medications   phentermine (ADIPEX-P) 37.5 MG tablet   Hypothyroidism - Primary   Relevant Orders   TSH   Hyperlipemia   Relevant Orders   Lipid panel   Essential hypertension, benign    She did have cough with ACE inhibitor this was discontinued  back in 2013 , I'm going to see if the ARB is causing any cough      Dry cough    Trial off of her ARB to see if this is causing the cough she is already being treated for acid reflux with Zantac. No signs of any infection      Diabetic neuropathy   DDD (degenerative disc disease), lumbar    Pain medication refilled      Relevant Medications   HYDROcodone-acetaminophen (NORCO) 10-325 MG per tablet   CKD (chronic kidney disease), stage II    Recheck renal function, and teens to work on risk factors of diabetes mellitus and hypertension         Note: This dictation was prepared with Diplomatic Services operational officer dictation along with smaller Tree surgeon. Any transcriptional errors that result from this process are unintentional.

## 2014-06-22 NOTE — Assessment & Plan Note (Signed)
Recheck A1c her diabetes has been well-controlled. Regarding her dry cough I'm that I have her stop her Diovan for the next 2 weeks to see if this helps her cough

## 2014-06-22 NOTE — Patient Instructions (Signed)
We will call with lab results  Hold the diovan for 2 weeks then see if cough improves- call me if it does  Try the phentermine take 1/2 tablet for 1 week for then increase to 1 tablet F/U 3 months

## 2014-06-22 NOTE — Assessment & Plan Note (Signed)
Pain medication refilled

## 2014-06-22 NOTE — Assessment & Plan Note (Signed)
Recheck renal function, and teens to work on risk factors of diabetes mellitus and hypertension

## 2014-06-22 NOTE — Assessment & Plan Note (Signed)
She did have cough with ACE inhibitor this was discontinued back in 2013 , I'm going to see if the ARB is causing any cough

## 2014-06-22 NOTE — Assessment & Plan Note (Signed)
We'll start her on phentermine again discussed a low-fat low carb diet which she has done in the past and had good results with. She will follow up in 2 months for weight check she will start with one half tablet to minimize any side effects of nausea vomiting headache dizziness, then move up to one full tablet after one week

## 2014-06-22 NOTE — Assessment & Plan Note (Signed)
Trial off of her ARB to see if this is causing the cough she is already being treated for acid reflux with Zantac. No signs of any infection

## 2014-06-23 LAB — MICROALBUMIN / CREATININE URINE RATIO
Creatinine, Urine: 133.2 mg/dL
Microalb Creat Ratio: 2.3 mg/g (ref 0.0–30.0)
Microalb, Ur: 0.3 mg/dL (ref <2.0)

## 2014-06-25 ENCOUNTER — Encounter: Payer: Self-pay | Admitting: *Deleted

## 2014-06-25 ENCOUNTER — Ambulatory Visit: Payer: Self-pay | Admitting: *Deleted

## 2014-06-25 DIAGNOSIS — I2699 Other pulmonary embolism without acute cor pulmonale: Secondary | ICD-10-CM

## 2014-06-25 DIAGNOSIS — I82409 Acute embolism and thrombosis of unspecified deep veins of unspecified lower extremity: Secondary | ICD-10-CM

## 2014-07-03 DIAGNOSIS — M47816 Spondylosis without myelopathy or radiculopathy, lumbar region: Secondary | ICD-10-CM | POA: Diagnosis not present

## 2014-07-03 DIAGNOSIS — M4316 Spondylolisthesis, lumbar region: Secondary | ICD-10-CM | POA: Diagnosis not present

## 2014-07-03 DIAGNOSIS — M4806 Spinal stenosis, lumbar region: Secondary | ICD-10-CM | POA: Diagnosis not present

## 2014-07-03 DIAGNOSIS — M5136 Other intervertebral disc degeneration, lumbar region: Secondary | ICD-10-CM | POA: Diagnosis not present

## 2014-07-10 ENCOUNTER — Other Ambulatory Visit: Payer: Self-pay | Admitting: Family Medicine

## 2014-07-10 ENCOUNTER — Ambulatory Visit (INDEPENDENT_AMBULATORY_CARE_PROVIDER_SITE_OTHER): Payer: Medicare Other | Admitting: *Deleted

## 2014-07-10 ENCOUNTER — Other Ambulatory Visit: Payer: Self-pay | Admitting: Cardiovascular Disease

## 2014-07-10 DIAGNOSIS — I82409 Acute embolism and thrombosis of unspecified deep veins of unspecified lower extremity: Secondary | ICD-10-CM | POA: Diagnosis not present

## 2014-07-10 DIAGNOSIS — Z7189 Other specified counseling: Secondary | ICD-10-CM

## 2014-07-10 NOTE — Telephone Encounter (Signed)
Refill appropriate and filled per protocol. 

## 2014-07-10 NOTE — Progress Notes (Signed)
Pt saw Dr Bronson Ing 9/28 15.  Per his office note: DVT and pulmonary embolism: Noncompliant with INR monitoring. I will switch to Xarelto 20 mg daily and treat for another 6 months and then stop altogether.  Saw pt in follow up today.  She has tolerated Xarelto well without any complications.  Reviewed labs from 06/22/14 ordered by Dr Buelah Manis.  SrCr 0.80  CrCl 137.61  Hgb 10.7  Hct  34.9  (stable)  Weight 267 Pt is pending epidural injection by Dr Lyla Son 07/31/14.  Pt to discontinue Xarelto today for good according to Dr Court Joy note.  Clearance letter faxed back to Dr Franki Cabot office stating patient will be off Xarelto effective today and is cleared for epidural injection on 07/31/14.  Pt is scheduled to follow up with Dr Raliegh Ip in 9/17  Dr Raliegh Ip made aware of this visit.

## 2014-07-25 ENCOUNTER — Telehealth: Payer: Self-pay | Admitting: Family Medicine

## 2014-07-25 MED ORDER — HYDROCODONE-ACETAMINOPHEN 10-325 MG PO TABS: 1.0000 | ORAL_TABLET | Freq: Four times a day (QID) | ORAL | Status: DC | PRN

## 2014-07-25 NOTE — Telephone Encounter (Signed)
Ok to refill??  Last office visit/ refill 06/22/2014.

## 2014-07-25 NOTE — Telephone Encounter (Signed)
Prescription printed.   Call placed to patient. No answer. No VM. 

## 2014-07-25 NOTE — Telephone Encounter (Signed)
patient is calling to get refill on pain medication  [phone]

## 2014-07-25 NOTE — Telephone Encounter (Signed)
Okay 

## 2014-07-26 NOTE — Telephone Encounter (Signed)
Call placed to patient. No answer. No VM.  

## 2014-07-27 NOTE — Telephone Encounter (Signed)
Multiple calls placed to patient with no answer and no return call.   Message to be closed.  

## 2014-08-09 ENCOUNTER — Telehealth: Payer: Self-pay | Admitting: Family Medicine

## 2014-08-09 NOTE — Telephone Encounter (Signed)
Ok to refill Lyrica??  Last office visit 06/22/2014.  Last refill 06/13/2014, #2 refills.

## 2014-08-10 NOTE — Telephone Encounter (Signed)
Medication called to pharmacy. 

## 2014-08-10 NOTE — Telephone Encounter (Signed)
Okay to refill? 

## 2014-08-22 ENCOUNTER — Ambulatory Visit (INDEPENDENT_AMBULATORY_CARE_PROVIDER_SITE_OTHER): Payer: Medicare Other | Admitting: Family Medicine

## 2014-08-22 ENCOUNTER — Encounter: Payer: Self-pay | Admitting: Family Medicine

## 2014-08-22 DIAGNOSIS — E1140 Type 2 diabetes mellitus with diabetic neuropathy, unspecified: Secondary | ICD-10-CM | POA: Diagnosis not present

## 2014-08-22 DIAGNOSIS — E1149 Type 2 diabetes mellitus with other diabetic neurological complication: Secondary | ICD-10-CM

## 2014-08-22 MED ORDER — HYDROCODONE-ACETAMINOPHEN 10-325 MG PO TABS: 1.0000 | ORAL_TABLET | Freq: Four times a day (QID) | ORAL | Status: DC | PRN

## 2014-08-22 NOTE — Assessment & Plan Note (Signed)
Continue MTF, A1C looks good, needs weight loss

## 2014-08-22 NOTE — Assessment & Plan Note (Signed)
Restart phetermine, will send to dietitican again for diabetes and nutrition education at [place] around her building which equals 1 mile= 5 days a week

## 2014-08-22 NOTE — Patient Instructions (Addendum)
Restart the weight loss medication Start Aspirin 81mg  once a day  Referral to nutritionist  Get the walk in the morning  F/U 2 months

## 2014-08-22 NOTE — Progress Notes (Signed)
Patient ID: Yesenia [name], female   DOB: [birth year], 64 y.o.   MRN: [id number]   Subjective:    Patient ID: Yesenia [name], female    DOB: 07/07/1950, 64 y.o.   MRN: [id number]  Patient presents for 2 month F/U  Pt here to f/u weight loss, she took phetermine for about 1 month, she cut out soda and bread, but then she had an alteraction with her mother which led to severe emotional eating. 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 She wants to go back on the medication and willing to follow a diet plan. Her daughter is with her today who states she will help with exercise.   Request refill on pain meds Seen by cardiology- off xarelto now  Review Of Systems:  GEN- denies fatigue, fever, weight loss,weakness, recent illness HEENT- denies eye drainage, change in vision, nasal discharge, CVS- denies chest pain, palpitations RESP- denies SOB, cough, wheeze ABD- denies N/V, change in stools, abd pain GU- denies dysuria, hematuria, dribbling, incontinence MSK- + joint pain, muscle aches, injury Neuro- denies headache, dizziness, syncope, seizure activity       Objective:    BP 128/70 mmHg  Pulse 76  Temp(Src) 97.5 F (36.4 C) (Oral)  Resp 14  Ht 5\' 3"  (1.6 m)  Wt 260 lb (117.935 kg)  BMI 46.07 kg/m2 GEN- NAD, alert and oriented x3 HEENT- PERRL, EOMI, non injected sclera, pink conjunctiva, MMM, oropharynx clear CVS- RRR, no murmur RESP-CTAB EXT- No edema Pulses- Radial  2+        Assessment & Plan:      Problem List Items Addressed This Visit    None      Note: This dictation was prepared with Dragon dictation along with smaller phrase technology. Any transcriptional errors that result from this process are unintentional.

## 2014-08-30 DIAGNOSIS — E1140 Type 2 diabetes mellitus with diabetic neuropathy, unspecified: Secondary | ICD-10-CM | POA: Diagnosis not present

## 2014-08-30 DIAGNOSIS — F329 Major depressive disorder, single episode, unspecified: Secondary | ICD-10-CM | POA: Diagnosis not present

## 2014-08-30 DIAGNOSIS — Z885 Allergy status to narcotic agent status: Secondary | ICD-10-CM | POA: Diagnosis not present

## 2014-08-30 DIAGNOSIS — G8929 Other chronic pain: Secondary | ICD-10-CM | POA: Diagnosis not present

## 2014-08-30 DIAGNOSIS — G4730 Sleep apnea, unspecified: Secondary | ICD-10-CM | POA: Diagnosis not present

## 2014-08-30 DIAGNOSIS — M797 Fibromyalgia: Secondary | ICD-10-CM | POA: Diagnosis not present

## 2014-08-30 DIAGNOSIS — Z6841 Body Mass Index (BMI) 40.0 and over, adult: Secondary | ICD-10-CM | POA: Diagnosis not present

## 2014-08-30 DIAGNOSIS — F419 Anxiety disorder, unspecified: Secondary | ICD-10-CM | POA: Diagnosis not present

## 2014-08-30 DIAGNOSIS — Z86711 Personal history of pulmonary embolism: Secondary | ICD-10-CM | POA: Diagnosis not present

## 2014-08-30 DIAGNOSIS — M4806 Spinal stenosis, lumbar region: Secondary | ICD-10-CM | POA: Diagnosis not present

## 2014-08-30 DIAGNOSIS — Z86718 Personal history of other venous thrombosis and embolism: Secondary | ICD-10-CM | POA: Diagnosis not present

## 2014-08-30 DIAGNOSIS — E039 Hypothyroidism, unspecified: Secondary | ICD-10-CM | POA: Diagnosis not present

## 2014-08-30 DIAGNOSIS — M4316 Spondylolisthesis, lumbar region: Secondary | ICD-10-CM | POA: Diagnosis not present

## 2014-08-30 DIAGNOSIS — M5136 Other intervertebral disc degeneration, lumbar region: Secondary | ICD-10-CM | POA: Diagnosis not present

## 2014-08-30 DIAGNOSIS — Z79899 Other long term (current) drug therapy: Secondary | ICD-10-CM | POA: Diagnosis not present

## 2014-08-30 DIAGNOSIS — Z888 Allergy status to other drugs, medicaments and biological substances status: Secondary | ICD-10-CM | POA: Diagnosis not present

## 2014-08-30 DIAGNOSIS — E785 Hyperlipidemia, unspecified: Secondary | ICD-10-CM | POA: Diagnosis not present

## 2014-08-30 DIAGNOSIS — M47816 Spondylosis without myelopathy or radiculopathy, lumbar region: Secondary | ICD-10-CM | POA: Diagnosis not present

## 2014-08-30 DIAGNOSIS — E669 Obesity, unspecified: Secondary | ICD-10-CM | POA: Diagnosis not present

## 2014-08-30 DIAGNOSIS — Z7901 Long term (current) use of anticoagulants: Secondary | ICD-10-CM | POA: Diagnosis not present

## 2014-08-30 DIAGNOSIS — I10 Essential (primary) hypertension: Secondary | ICD-10-CM | POA: Diagnosis not present

## 2014-09-20 ENCOUNTER — Telehealth: Payer: Self-pay | Admitting: *Deleted

## 2014-09-20 NOTE — Telephone Encounter (Signed)
Received fax requesting refill on Phentermine.   Ok to refill??  Last office visit 08/22/2014.  Last refill  06/22/2014, #1 refill.

## 2014-09-21 MED ORDER — PHENTERMINE HCL 37.5 MG PO TABS: 37.5000 mg | ORAL_TABLET | Freq: Every day | ORAL | Status: DC

## 2014-09-21 NOTE — Telephone Encounter (Signed)
Medication to be called to pharmacy after 9am.

## 2014-09-21 NOTE — Telephone Encounter (Signed)
Okay to refill? 

## 2014-09-21 NOTE — Telephone Encounter (Signed)
Medication called to pharmacy. 

## 2014-09-26 DIAGNOSIS — M5136 Other intervertebral disc degeneration, lumbar region: Secondary | ICD-10-CM | POA: Diagnosis not present

## 2014-09-26 DIAGNOSIS — M47816 Spondylosis without myelopathy or radiculopathy, lumbar region: Secondary | ICD-10-CM | POA: Diagnosis not present

## 2014-09-26 DIAGNOSIS — M4316 Spondylolisthesis, lumbar region: Secondary | ICD-10-CM | POA: Diagnosis not present

## 2014-09-26 DIAGNOSIS — M4806 Spinal stenosis, lumbar region: Secondary | ICD-10-CM | POA: Diagnosis not present

## 2014-10-05 ENCOUNTER — Other Ambulatory Visit: Payer: Self-pay | Admitting: Family Medicine

## 2014-10-05 ENCOUNTER — Other Ambulatory Visit: Payer: Self-pay | Admitting: Gastroenterology

## 2014-10-24 ENCOUNTER — Ambulatory Visit: Payer: Medicare Other | Admitting: Nutrition

## 2014-10-24 ENCOUNTER — Encounter: Payer: Self-pay | Admitting: Family Medicine

## 2014-10-24 ENCOUNTER — Ambulatory Visit (INDEPENDENT_AMBULATORY_CARE_PROVIDER_SITE_OTHER): Payer: Medicare Other | Admitting: Family Medicine

## 2014-10-24 VITALS — BP 130/78 | HR 76 | Temp 98.5°F | Resp 12 | Ht 63.0 in | Wt 268.0 lb

## 2014-10-24 DIAGNOSIS — E1149 Type 2 diabetes mellitus with other diabetic neurological complication: Secondary | ICD-10-CM

## 2014-10-24 DIAGNOSIS — E1143 Type 2 diabetes mellitus with diabetic autonomic (poly)neuropathy: Secondary | ICD-10-CM

## 2014-10-24 DIAGNOSIS — J069 Acute upper respiratory infection, unspecified: Secondary | ICD-10-CM

## 2014-10-24 DIAGNOSIS — I10 Essential (primary) hypertension: Secondary | ICD-10-CM

## 2014-10-24 DIAGNOSIS — E1140 Type 2 diabetes mellitus with diabetic neuropathy, unspecified: Secondary | ICD-10-CM | POA: Diagnosis not present

## 2014-10-24 DIAGNOSIS — N182 Chronic kidney disease, stage 2 (mild): Secondary | ICD-10-CM | POA: Diagnosis not present

## 2014-10-24 LAB — CBC WITH DIFFERENTIAL/PLATELET
Basophils Absolute: 0.1 10*3/uL (ref 0.0–0.1)
Basophils Relative: 1 % (ref 0–1)
Eosinophils Absolute: 0.5 10*3/uL (ref 0.0–0.7)
Eosinophils Relative: 8 % — ABNORMAL HIGH (ref 0–5)
HEMATOCRIT: 36.7 % (ref 36.0–46.0)
Hemoglobin: 11.1 g/dL — ABNORMAL LOW (ref 12.0–15.0)
LYMPHS ABS: 2.1 10*3/uL (ref 0.7–4.0)
Lymphocytes Relative: 35 % (ref 12–46)
MCH: 23.7 pg — ABNORMAL LOW (ref 26.0–34.0)
MCHC: 30.2 g/dL (ref 30.0–36.0)
MCV: 78.3 fL (ref 78.0–100.0)
MONO ABS: 0.4 10*3/uL (ref 0.1–1.0)
MPV: 9.8 fL (ref 8.6–12.4)
Monocytes Relative: 6 % (ref 3–12)
NEUTROS ABS: 3 10*3/uL (ref 1.7–7.7)
Neutrophils Relative %: 50 % (ref 43–77)
Platelets: 297 10*3/uL (ref 150–400)
RBC: 4.69 MIL/uL (ref 3.87–5.11)
RDW: 17.8 % — ABNORMAL HIGH (ref 11.5–15.5)
WBC: 6 10*3/uL (ref 4.0–10.5)

## 2014-10-24 LAB — COMPREHENSIVE METABOLIC PANEL
ALBUMIN: 3.5 g/dL (ref 3.5–5.2)
ALK PHOS: 68 U/L (ref 39–117)
ALT: 9 U/L (ref 0–35)
AST: 20 U/L (ref 0–37)
BILIRUBIN TOTAL: 0.3 mg/dL (ref 0.2–1.2)
BUN: 11 mg/dL (ref 6–23)
CO2: 23 mEq/L (ref 19–32)
Calcium: 9.9 mg/dL (ref 8.4–10.5)
Chloride: 103 mEq/L (ref 96–112)
Creat: 0.9 mg/dL (ref 0.50–1.10)
Glucose, Bld: 104 mg/dL — ABNORMAL HIGH (ref 70–99)
POTASSIUM: 4.5 meq/L (ref 3.5–5.3)
Sodium: 142 mEq/L (ref 135–145)
Total Protein: 7.1 g/dL (ref 6.0–8.3)

## 2014-10-24 LAB — HEMOGLOBIN A1C
Hgb A1c MFr Bld: 6.4 % — ABNORMAL HIGH (ref <5.7)
MEAN PLASMA GLUCOSE: 137 mg/dL — AB (ref <117)

## 2014-10-24 MED ORDER — AZITHROMYCIN 250 MG PO TABS: ORAL_TABLET | ORAL | Status: DC

## 2014-10-24 MED ORDER — HYDROCODONE-ACETAMINOPHEN 10-325 MG PO TABS: 1.0000 | ORAL_TABLET | Freq: Four times a day (QID) | ORAL | Status: DC | PRN

## 2014-10-24 MED ORDER — PHENTERMINE HCL 37.5 MG PO TABS: 37.5000 mg | ORAL_TABLET | Freq: Every day | ORAL | Status: DC

## 2014-10-24 NOTE — Patient Instructions (Signed)
Take antibiotics Mucinex DM for cough Pain medication refilled Restart phentermine  Continue all other meds We will call with lab results F/U 3 months

## 2014-10-24 NOTE — Assessment & Plan Note (Signed)
She would benefit significantly from at least 50-60 pounds of weight loss. She is very high risk for stroke or cardiovascular disease and I discussed this with her.  He is not adherent to diabetic or heart healthy diet. She did lose some weight with phentermine in the past and she needs some type of intervention as her weight continues to go up. Im goint to  try her on the phentermine again we have called in the prescription for her

## 2014-10-24 NOTE — Progress Notes (Signed)
Patient ID: Yesenia [name], female   DOB: 1951-03-15, 64 y.o.   MRN: [id number]   Subjective:    Patient ID: Yesenia [name], female    DOB: May 16, 1950, 64 y.o.   MRN: [id number]  Patient presents for 2 month F/U and Cough    Patient here for interim follow-up visit on her weight. She was supposed to start phentermine again however she states she lost the prescription her weight is actually up 8 pounds today. She eats a lot of fast food she has not been walking like she should. She states that her blood sugar went up after she had an epidural injection about 6 weeks ago her blood sugar was up into the 400s and she felt a little delirious she did not go to the emergency room at that time of the past few weeks her blood sugar has come down and the highest has been 140. She denies any hypoglycemia symptoms. She states she is taken off her medication as prescribed. She does not have an appointment with the nutritionist until August   Her other concern today is cough for the past 6 days. Her cough is minimal production she has not had any wheezing no fever but feels very fatigued. She has not taken any over-the-counter medications. She does tell me that her mother was recently put and hospice and she had been visiting her at the hospital.    Review Of Systems:  GEN- + fatigue, denies fever, weight loss,weakness, recent illness HEENT- denies eye drainage, change in vision, nasal discharge, CVS- denies chest pain, palpitations RESP- denies SOB, +cough, wheeze ABD- denies N/V, change in stools, abd pain GU- denies dysuria, hematuria, dribbling, incontinence MSK- denies joint pain, muscle aches, injury Neuro- denies headache, dizziness, syncope, seizure activity       Objective:    BP 130/78 mmHg  Pulse 76  Temp(Src) 98.5 F (36.9 C) (Oral)  Resp 12  Ht 5\' 3"  (1.6 m)  Wt 268 lb (121.564 kg)  BMI 47.49 kg/m2 GEN- NAD, alert and oriented x3,fatigued appearing , + 8lbs  HEENT- PERRL,  EOMI, non injected sclera, pink conjunctiva, MMM, oropharynx clear Neck- Supple, no LAD CVS- RRR, no murmur RESP- bilat rhonchi, no wheeze, normal WOB, harsh cough EXT- No edema Pulses- Radial  2+        Assessment & Plan:      Problem List Items Addressed This Visit    Type II diabetes mellitus with neurological manifestations - Primary   Relevant Orders   CBC with Differential/Platelet   Comprehensive metabolic panel   Hemoglobin A1c   Morbid obesity   Relevant Medications   phentermine (ADIPEX-P) 37.5 MG tablet   Essential hypertension, benign    Other Visit Diagnoses    Acute URI        Based on exams, comorbidites, will cover with Zpak, mucinex DM    Relevant Medications    azithromycin (ZITHROMAX) 250 MG tablet       Note: This dictation was prepared with Dragon dictation along with smaller phrase technology. Any transcriptional errors that result from this process are unintentional.

## 2014-10-24 NOTE — Assessment & Plan Note (Signed)
Diabetes has been well controlled. I'll recheck her A1c along with her other fasting labs today. Continue current medications: His A1c less than 7%

## 2014-10-26 ENCOUNTER — Encounter: Payer: Self-pay | Admitting: *Deleted

## 2014-10-29 ENCOUNTER — Encounter: Payer: Self-pay | Admitting: *Deleted

## 2014-11-03 ENCOUNTER — Encounter (HOSPITAL_COMMUNITY): Payer: Self-pay | Admitting: Emergency Medicine

## 2014-11-03 ENCOUNTER — Emergency Department (HOSPITAL_COMMUNITY)
Admission: EM | Admit: 2014-11-03 | Discharge: 2014-11-03 | Disposition: A | Discharge status: Home / Self Care | Payer: Medicare Other | Attending: Emergency Medicine | Admitting: Emergency Medicine

## 2014-11-03 ENCOUNTER — Emergency Department (HOSPITAL_COMMUNITY): Payer: Medicare Other

## 2014-11-03 DIAGNOSIS — E119 Type 2 diabetes mellitus without complications: Secondary | ICD-10-CM | POA: Insufficient documentation

## 2014-11-03 DIAGNOSIS — S838X1A Sprain of other specified parts of right knee, initial encounter: Secondary | ICD-10-CM | POA: Diagnosis not present

## 2014-11-03 DIAGNOSIS — M797 Fibromyalgia: Secondary | ICD-10-CM | POA: Diagnosis not present

## 2014-11-03 DIAGNOSIS — Z87442 Personal history of urinary calculi: Secondary | ICD-10-CM | POA: Diagnosis not present

## 2014-11-03 DIAGNOSIS — Z8701 Personal history of pneumonia (recurrent): Secondary | ICD-10-CM | POA: Insufficient documentation

## 2014-11-03 DIAGNOSIS — Y9289 Other specified places as the place of occurrence of the external cause: Secondary | ICD-10-CM | POA: Insufficient documentation

## 2014-11-03 DIAGNOSIS — Z79899 Other long term (current) drug therapy: Secondary | ICD-10-CM | POA: Insufficient documentation

## 2014-11-03 DIAGNOSIS — Z86718 Personal history of other venous thrombosis and embolism: Secondary | ICD-10-CM | POA: Diagnosis not present

## 2014-11-03 DIAGNOSIS — X58XXXA Exposure to other specified factors, initial encounter: Secondary | ICD-10-CM | POA: Insufficient documentation

## 2014-11-03 DIAGNOSIS — Y998 Other external cause status: Secondary | ICD-10-CM | POA: Diagnosis not present

## 2014-11-03 DIAGNOSIS — K219 Gastro-esophageal reflux disease without esophagitis: Secondary | ICD-10-CM | POA: Insufficient documentation

## 2014-11-03 DIAGNOSIS — S8991XA Unspecified injury of right lower leg, initial encounter: Secondary | ICD-10-CM | POA: Diagnosis present

## 2014-11-03 DIAGNOSIS — F419 Anxiety disorder, unspecified: Secondary | ICD-10-CM | POA: Insufficient documentation

## 2014-11-03 DIAGNOSIS — S8391XA Sprain of unspecified site of right knee, initial encounter: Secondary | ICD-10-CM | POA: Diagnosis not present

## 2014-11-03 DIAGNOSIS — Z86711 Personal history of pulmonary embolism: Secondary | ICD-10-CM | POA: Insufficient documentation

## 2014-11-03 DIAGNOSIS — S8392XA Sprain of unspecified site of left knee, initial encounter: Secondary | ICD-10-CM | POA: Diagnosis not present

## 2014-11-03 DIAGNOSIS — S82141A Displaced bicondylar fracture of right tibia, initial encounter for closed fracture: Secondary | ICD-10-CM | POA: Diagnosis not present

## 2014-11-03 DIAGNOSIS — E039 Hypothyroidism, unspecified: Secondary | ICD-10-CM | POA: Diagnosis not present

## 2014-11-03 DIAGNOSIS — Z792 Long term (current) use of antibiotics: Secondary | ICD-10-CM | POA: Insufficient documentation

## 2014-11-03 DIAGNOSIS — Y9389 Activity, other specified: Secondary | ICD-10-CM | POA: Diagnosis not present

## 2014-11-03 DIAGNOSIS — S82831A Other fracture of upper and lower end of right fibula, initial encounter for closed fracture: Secondary | ICD-10-CM | POA: Insufficient documentation

## 2014-11-03 DIAGNOSIS — T148XXA Other injury of unspecified body region, initial encounter: Secondary | ICD-10-CM

## 2014-11-03 MED ORDER — OXYCODONE-ACETAMINOPHEN 5-325 MG PO TABS: 1.0000 | ORAL_TABLET | ORAL | Status: DC | PRN

## 2014-11-03 MED ORDER — OXYCODONE-ACETAMINOPHEN 5-325 MG PO TABS
1.0000 | ORAL_TABLET | Freq: Once | ORAL | Status: AC
  Administered 2014-11-03: 1 via ORAL
  Filled 2014-11-03: qty 1

## 2014-11-03 NOTE — Discharge Instructions (Signed)
You have sprained your knee joint. Use the immobilizer for comfort, when walking. Remove the immobilizer and apply ice to the sore area 3 or 4 times a day for 1-2 days, after that, use heat. Follow-up with the orthopedic doctor for checkup, next week.   Joint Sprain A sprain is a tear or stretch in the ligaments that hold a joint together. Severe sprains may need as long as 3-6 weeks of immobilization and/or exercises to heal completely. Sprained joints should be rested and protected. If not, they can become unstable and prone to re-injury. Proper treatment can reduce your pain, shorten the period of disability, and reduce the risk of repeated injuries. TREATMENT   Rest and elevate the injured joint to reduce pain and swelling.  Apply ice packs to the injury for 20-30 minutes every 2-3 hours for the next 2-3 days.  Keep the injury wrapped in a compression bandage or splint as long as the joint is painful or as instructed by your caregiver.  Do not use the injured joint until it is completely healed to prevent re-injury and chronic instability. Follow the instructions of your caregiver.  Long-term sprain management may require exercises and/or treatment by a physical therapist. Taping or special braces may help stabilize the joint until it is completely better. SEEK MEDICAL CARE IF:   You develop increased pain or swelling of the joint.  You develop increasing redness and warmth of the joint.  You develop a fever.  It becomes stiff.  Your hand or foot gets cold or numb. Document Released: 04/30/2004 Document Revised: 06/15/2011 Document Reviewed: 04/09/2008 [facility] Patient Information 2015 [place], [place]. This information is not intended to replace advice given to you by your health care provider. Make sure you discuss any questions you have with your health care provider.
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
## 2014-11-03 NOTE — ED Notes (Signed)
Pt states that she twisted her right knee after she missed a step on Thursday.

## 2014-11-06 ENCOUNTER — Other Ambulatory Visit: Payer: Self-pay | Admitting: Family Medicine

## 2014-11-06 NOTE — Telephone Encounter (Signed)
Medication refilled per protocol. 

## 2014-11-06 NOTE — Telephone Encounter (Signed)
Okay to refill give 4

## 2014-11-06 NOTE — Telephone Encounter (Signed)
LRF 08/10/14 #90 + 2  LOV 10/24/14  OK refill?

## 2014-11-28 ENCOUNTER — Ambulatory Visit (HOSPITAL_COMMUNITY): Payer: Medicare Other

## 2014-11-30 ENCOUNTER — Encounter: Payer: Medicare Other | Attending: Family Medicine | Admitting: Nutrition

## 2014-11-30 ENCOUNTER — Encounter: Payer: Self-pay | Admitting: Nutrition

## 2014-11-30 VITALS — Ht 61.5 in | Wt 255.4 lb

## 2014-11-30 DIAGNOSIS — Z6841 Body Mass Index (BMI) 40.0 and over, adult: Secondary | ICD-10-CM | POA: Diagnosis not present

## 2014-11-30 DIAGNOSIS — E118 Type 2 diabetes mellitus with unspecified complications: Secondary | ICD-10-CM

## 2014-11-30 DIAGNOSIS — E1140 Type 2 diabetes mellitus with diabetic neuropathy, unspecified: Secondary | ICD-10-CM | POA: Insufficient documentation

## 2014-11-30 NOTE — Patient Instructions (Addendum)
Goals 1. Follow My Plate 2. Cut out sodas. 3. Drink only water-5 bottles per day. 4. Cut out snacks between meals unless a low blood sugar. 5. Increase fresh fruits and vegetables. 6. Eat 2-3 carb choices per meal (30-45 grams per meal) 7. Get A1C below 6%. 8. Lose 1-2 lbs per week til next visit.

## 2014-11-30 NOTE — Progress Notes (Signed)
  Medical Nutrition Therapy:  Appt start time: 1330 end time:  1430.  Assessment:  Primary concerns today: Diabetes and obesity. Lives by herself. She doesn't cook and eats out mostly with her sister.. A1C 6.4%. Wants to lose weight. Admits to drinking a lot of sodas-Coke or Dr. Malachi Bonds. She lost her mom recently and admits to depression and having lots of anger issues. Would like to schedule appt with PCP and get on medications and see therapist/counselor. Eats only 1 meal per day. PMH: DM, morbid obesity, HTN and Hyperlipidemia. On Metformin 500 mg BID.  Physical activity: none but has joined Systems developer at [employer].. Diet is inadequate in all foods groups and excessive in fat, sugar and salt. Low in fresh fruits, vegetables, fiber and whole grains. She is willing to improve eating habits and start exercising to improve blood sugars and lose weight.  Preferred Learning Style:   Visual  Learning Readiness:   Ready  Change in progress  MEDICATIONS: See list   DIETARY INTAKE:  24-hr recall:  B (10-11 am AM): skips  Snk ( AM): water Coke L ( PM): skips, Coke Snk ( PM): water D ( PM):1 box of broccoli and cheese, water Snk ( PM): Coke  Beverages:water, coke  Usual physical activity: ADL's.  Estimated energy needs: 1500 calories 170 g carbohydrates 112 g protein 42 g fat  Progress Towards Goal(s):  In progress.   Nutritional Diagnosis:  NB-1.1 Food and nutrition-related knowledge deficit As related to Diabetes and morbid obesity  As evidenced by A1C 6.4% and BMI > 40..    Intervention:  Nutrition and diabetes education provided on low sodium low fat high fiber diet, My Plate, portion sizes, meal planning, target ranges for blood sugars, signs/symptoms of hyper/hypoglcyemia and treatment of both, complications related to diabetes and benefits of exercise and weight loss, importance of foot, eye and dental care.  Goals 1. Follow My Plate 2. Cut out sodas. 3. Drink only  water-5 bottles per day. 4. Cut out snacks between meals unless a low blood sugar. 5. Increase fresh fruits and vegetables. 6. Eat 2-3 carb choices per meal (30-45 grams per meal) 7. Get A1C below 6%. 8. Lose 1-2 lbs per week til next visit. 9. Keep a food journal and BS log.  Teaching Method Utilized:  Visual Auditory Hands on  Handouts given during visit include:  The Plate Method  Meal Plan Card  Diabetes Instructions   Barriers to learning/adherence to lifestyle change: None  Demonstrated degree of understanding via:  Teach Back   Monitoring/Evaluation:  Dietary intake, exercise, meal planning, SBG, and body weight in 1 month(s).

## 2014-12-04 ENCOUNTER — Telehealth: Payer: Self-pay | Admitting: Family Medicine

## 2014-12-04 MED ORDER — HYDROCODONE-ACETAMINOPHEN 10-325 MG PO TABS: 1.0000 | ORAL_TABLET | Freq: Four times a day (QID) | ORAL | Status: DC | PRN

## 2014-12-04 NOTE — Telephone Encounter (Signed)
Okay to refill Norco 10-325mg  #60

## 2014-12-04 NOTE — Telephone Encounter (Signed)
Patient requesting prescription for her :oxyCODONE-acetaminophen (PERCOCET) 5-325 MG

## 2014-12-04 NOTE — Telephone Encounter (Signed)
Patient seen in ED on 11/03/2014 and given Percocet 5/325mg , #20. Patient has not received Percocet from [facility] since 2014.  Last OV 10/24/2014 and patient was given prescription for Norco 10/325 q 6hrs PRN #60 on that date.   MD please advise.

## 2014-12-04 NOTE — Telephone Encounter (Signed)
Prescription printed and patient made aware to come to office to pick up after 2pm on 12/04/2014. 

## 2014-12-07 ENCOUNTER — Telehealth: Payer: Self-pay | Admitting: Family Medicine

## 2014-12-07 MED ORDER — GLUCOSE BLOOD VI STRP: ORAL_STRIP | Status: DC

## 2014-12-07 NOTE — Telephone Encounter (Signed)
Will call patient and advised that insurance may not pay for prescriptions if recently filled.   Ok to refill??

## 2014-12-07 NOTE — Telephone Encounter (Signed)
Medication called to pharmacy. 
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
## 2014-12-07 NOTE — Telephone Encounter (Signed)
Okay to refill these?

## 2014-12-17 ENCOUNTER — Ambulatory Visit (HOSPITAL_COMMUNITY)
Admission: RE | Admit: 2014-12-17 | Discharge: 2014-12-17 | Disposition: A | Discharge status: Home / Self Care | Payer: Medicare Other | Source: Ambulatory Visit | Attending: Family Medicine | Admitting: Family Medicine

## 2014-12-17 DIAGNOSIS — Z1231 Encounter for screening mammogram for malignant neoplasm of breast: Secondary | ICD-10-CM | POA: Diagnosis not present

## 2014-12-18 ENCOUNTER — Encounter: Payer: Self-pay | Admitting: Internal Medicine

## 2014-12-19 ENCOUNTER — Ambulatory Visit (INDEPENDENT_AMBULATORY_CARE_PROVIDER_SITE_OTHER): Payer: Medicare Other | Admitting: Gastroenterology

## 2014-12-19 ENCOUNTER — Other Ambulatory Visit: Payer: Self-pay

## 2014-12-19 ENCOUNTER — Encounter: Payer: Self-pay | Admitting: Gastroenterology

## 2014-12-19 VITALS — BP 136/90 | HR 85 | Temp 97.0°F | Ht 61.0 in | Wt 251.8 lb

## 2014-12-19 DIAGNOSIS — Z800 Family history of malignant neoplasm of digestive organs: Secondary | ICD-10-CM

## 2014-12-19 DIAGNOSIS — D649 Anemia, unspecified: Secondary | ICD-10-CM

## 2014-12-19 LAB — IRON AND TIBC
%SAT: 12 % (ref 11–50)
IRON: 49 ug/dL (ref 45–160)
TIBC: 420 ug/dL (ref 250–450)
UIBC: 371 ug/dL (ref 125–400)

## 2014-12-19 LAB — FERRITIN: Ferritin: 11 ng/mL (ref 10–291)

## 2014-12-19 MED ORDER — PEG 3350-KCL-NA BICARB-NACL 420 G PO SOLR: 4000.0000 mL | ORAL | Status: DC
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
## 2014-12-19 NOTE — Patient Instructions (Signed)
Please have labs done today.   We have scheduled you for a colonoscopy, upper endoscopy, and dilation with Dr. Gala Romney in the near future!

## 2014-12-21 NOTE — Assessment & Plan Note (Signed)
Both mother and brother. Due for high risk screening colonoscopy now.

## 2014-12-21 NOTE — Progress Notes (Signed)
cc'ed to pcp °

## 2014-12-21 NOTE — Assessment & Plan Note (Signed)
64 year old female with history of IDA, last TCS in 2011 with hyperplastic polyp and last EGD in 2006. Previously set up for TCS/EGD to evaluate for evolving IDA but had to cancel due to mother's illness. Positive family history of colon cancer in both mother and brother (mother recently passed away). Due to IDA, proceed with colonoscopy and EGD in near future. As of note, reports vague globus sensation. EGD +/- dilation as indicated. Heme negative on file.   Proceed with TCS/EGD/dilation with Dr. Gala Romney in near future: the risks, benefits, and alternatives have been discussed with the patient in detail. The patient states understanding and desires to proceed. Check iron studies now

## 2014-12-23 NOTE — Progress Notes (Signed)
Quick Note:  Ferritin 11. Iron normal. Anemia likely multifactorial. ______

## 2015-01-02 ENCOUNTER — Encounter: Payer: Self-pay | Admitting: Family Medicine

## 2015-01-02 ENCOUNTER — Ambulatory Visit (INDEPENDENT_AMBULATORY_CARE_PROVIDER_SITE_OTHER): Payer: Medicare Other | Admitting: Family Medicine

## 2015-01-02 VITALS — BP 132/88 | HR 86 | Temp 97.7°F | Resp 14 | Ht 61.0 in | Wt 256.0 lb

## 2015-01-02 DIAGNOSIS — T148 Other injury of unspecified body region: Secondary | ICD-10-CM

## 2015-01-02 DIAGNOSIS — Z23 Encounter for immunization: Secondary | ICD-10-CM | POA: Diagnosis not present

## 2015-01-02 DIAGNOSIS — R600 Localized edema: Secondary | ICD-10-CM | POA: Diagnosis not present

## 2015-01-02 DIAGNOSIS — E1149 Type 2 diabetes mellitus with other diabetic neurological complication: Secondary | ICD-10-CM

## 2015-01-02 DIAGNOSIS — E1143 Type 2 diabetes mellitus with diabetic autonomic (poly)neuropathy: Secondary | ICD-10-CM | POA: Diagnosis not present

## 2015-01-02 DIAGNOSIS — W57XXXA Bitten or stung by nonvenomous insect and other nonvenomous arthropods, initial encounter: Secondary | ICD-10-CM | POA: Diagnosis not present

## 2015-01-02 DIAGNOSIS — N182 Chronic kidney disease, stage 2 (mild): Secondary | ICD-10-CM | POA: Diagnosis not present

## 2015-01-02 DIAGNOSIS — L03116 Cellulitis of left lower limb: Secondary | ICD-10-CM

## 2015-01-02 DIAGNOSIS — E1140 Type 2 diabetes mellitus with diabetic neuropathy, unspecified: Secondary | ICD-10-CM | POA: Diagnosis not present

## 2015-01-02 DIAGNOSIS — Z0000 Encounter for general adult medical examination without abnormal findings: Secondary | ICD-10-CM

## 2015-01-02 DIAGNOSIS — I10 Essential (primary) hypertension: Secondary | ICD-10-CM | POA: Diagnosis not present

## 2015-01-02 LAB — CBC WITH DIFFERENTIAL/PLATELET
BASOS ABS: 0 10*3/uL (ref 0.0–0.1)
BASOS PCT: 0 % (ref 0–1)
EOS ABS: 0.6 10*3/uL (ref 0.0–0.7)
Eosinophils Relative: 8 % — ABNORMAL HIGH (ref 0–5)
HCT: 36.9 % (ref 36.0–46.0)
Hemoglobin: 11.5 g/dL — ABNORMAL LOW (ref 12.0–15.0)
Lymphocytes Relative: 24 % (ref 12–46)
Lymphs Abs: 1.7 10*3/uL (ref 0.7–4.0)
MCH: 24 pg — ABNORMAL LOW (ref 26.0–34.0)
MCHC: 31.2 g/dL (ref 30.0–36.0)
MCV: 76.9 fL — ABNORMAL LOW (ref 78.0–100.0)
MPV: 9.8 fL (ref 8.6–12.4)
Monocytes Absolute: 0.6 10*3/uL (ref 0.1–1.0)
Monocytes Relative: 9 % (ref 3–12)
NEUTROS PCT: 59 % (ref 43–77)
Neutro Abs: 4.1 10*3/uL (ref 1.7–7.7)
PLATELETS: 310 10*3/uL (ref 150–400)
RBC: 4.8 MIL/uL (ref 3.87–5.11)
RDW: 17.1 % — ABNORMAL HIGH (ref 11.5–15.5)
WBC: 6.9 10*3/uL (ref 4.0–10.5)

## 2015-01-02 LAB — LIPID PANEL
CHOL/HDL RATIO: 2.9 ratio (ref <=5.0)
Cholesterol: 157 mg/dL (ref 125–200)
HDL: 54 mg/dL (ref >=46)
LDL CALC: 80 mg/dL (ref <130)
TRIGLYCERIDES: 115 mg/dL (ref <150)
VLDL: 23 mg/dL (ref <30)

## 2015-01-02 LAB — BASIC METABOLIC PANEL
BUN: 10 mg/dL (ref 7–25)
CALCIUM: 10 mg/dL (ref 8.6–10.4)
CHLORIDE: 91 mmol/L — AB (ref 98–110)
CO2: 34 mmol/L — AB (ref 20–31)
Creat: 0.96 mg/dL (ref 0.50–0.99)
Glucose, Bld: 98 mg/dL (ref 70–99)
Potassium: 3.3 mmol/L — ABNORMAL LOW (ref 3.5–5.3)
SODIUM: 135 mmol/L (ref 135–146)

## 2015-01-02 MED ORDER — PHENTERMINE HCL 37.5 MG PO TABS: 37.5000 mg | ORAL_TABLET | Freq: Every day | ORAL | Status: DC

## 2015-01-02 MED ORDER — SULFAMETHOXAZOLE-TRIMETHOPRIM 800-160 MG PO TABS: 1.0000 | ORAL_TABLET | Freq: Two times a day (BID) | ORAL | Status: DC

## 2015-01-02 MED ORDER — HYDROCODONE-ACETAMINOPHEN 10-325 MG PO TABS: 1.0000 | ORAL_TABLET | Freq: Four times a day (QID) | ORAL | Status: DC | PRN

## 2015-01-02 MED ORDER — PERMETHRIN 5 % EX CREA: TOPICAL_CREAM | Freq: Once | CUTANEOUS | Status: DC

## 2015-01-02 NOTE — Patient Instructions (Addendum)
Continue current medication  Diabetic shoes Compression hose We will call with labs Take antibiotics, use permethrin at bedtime rinse in the morning  FLu shot given  Continue phentermine  F/U 3 months

## 2015-01-02 NOTE — Progress Notes (Signed)
Patient ID: Yesenia [name], female   DOB: 1950/10/20, 64 y.o.   MRN: [id number]  Subjective:   Patient presents for Medicare Annual/Subsequent preventive examination.   Patient here for wellness exam. She has a rash that continues to come on whenever she does her mother's home to clean it out. She states it is very tender all either. She continues to get bit by something. She scratches a lot at nighttime and she noticed redness and drainage from some spots. She has not had any fever.  She requests diabetic shoes  Requesting medication refill  She is followed by her psychiatrist she was taken off of Valium and her trazodone was decreased.  She was seen by gastroenterology she has an EGD and colonoscopy scheduled   Obesity- taking phentermine- weight down 15lbs followed by nutritionist, no concerns with the medication    Review Past Medical/Family/Social: Per EMR   Risk Factors  Current exercise habits: yes Dietary issues discussed: yes  Cardiac risk factors: Obesity (BMI >= 30 kg/m2). DM, HTN  Depression Screen - Current treatment  (Note: if answer to either of the following is "Yes", a more complete depression screening is indicated)  Over the past two weeks, have you felt down, depressed or hopeless? No Over the past two weeks, have you felt little interest or pleasure in doing things? No Have you lost interest or pleasure in daily life? No Do you often feel hopeless? No Do you cry easily over simple problems? No   Activities of Daily Living  In your present state of health, do you have any difficulty performing the following activities?:  Driving? No  Managing money? No  Feeding yourself? No  Getting from bed to chair? No  Climbing a flight of stairs? yes  Preparing food and eating?: No  Bathing or showering? No  Getting dressed: No  Getting to the toilet? No  Using the toilet:No  Moving around from place to place: No  In the past year have you fallen or had a near  fall?:No  Are you sexually active? No  Do you have more than one partner? No   Hearing Difficulties: No  Do you often ask people to speak up or repeat themselves? No  Do you experience ringing or noises in your ears? No Do you have difficulty understanding soft or whispered voices? No  Do you feel that you have a problem with memory? No Do you often misplace items? No  Do you feel safe at home? Yes  Cognitive Testing  Alert? Yes Normal Appearance?Yes  Oriented to person? Yes Place? Yes  Time? Yes  Recall of three objects? Yes  Can perform simple calculations? Yes  Displays appropriate judgment?Yes  Can read the correct time from a watch face?Yes   List the Names of Other Physician/Practitioners you currently use:  Dr. Francesco Runner, GI , Pyschiatry- [facility]      Screening Tests / Date Colonoscopy  - Scheduled                  Zostavax -UTD Mammogram -UTD Influenza Vaccine Due Tetanus/tdap - UTD  Pneumonia vaccine- UTD  GEN- denies fatigue, fever, weight loss,weakness, recent illness HEENT- denies eye drainage, change in vision, nasal discharge, CVS- denies chest pain, palpitations RESP- denies SOB, cough, wheeze ABD- denies N/V, change in stools, abd pain GU- denies dysuria, hematuria, dribbling, incontinence MSK- denies joint pain, muscle aches, injury Neuro- denies headache, dizziness, syncope, seizure activity  PHYSICAL GEN- NAD, alert and oriented x3  HEENT- PERRL, EOMI, non injected sclera, pink conjunctiva, MMM, oropharynx clear Neck- Supple, no thryomegaly CVS- RRR, no murmur RESP-CTAB Skin- Multiple erythematous maculoaapular lesions with scabs at center on abdomen- large one beneath umbilicus- mild yellow drainage, no induratiuon, no fluctuance, bilat forearm, left shin with erythema over shin EXT- 1+ edema Pulses- Radial, DP- 2+    Assessment:    Annual wellness medicare exam   Plan:    During the course of the visit the patient was educated and  counseled about appropriate screening and preventive services including:  Screening mammography  Colorectal cancer screening    Diet review for nutrition referral? Yes _Seeing already___ Not Indicated ____  Patient Instructions (the written plan) was given to the patient.  Medicare Attestation  I have personally reviewed:  The patient's medical and social history  Their use of alcohol, tobacco or illicit drugs  Their current medications and supplements  The patient's functional ability including ADLs,fall risks, home safety risks, cognitive, and hearing and visual impairment  Diet and physical activities  Evidence for depression or mood disorders  The patient's weight, height, BMI, and visual acuity have been recorded in the chart. I have made referrals, counseling, and provided education to the patient based on review of the above and I have provided the patient with a written personalized care plan for preventive services.

## 2015-01-03 ENCOUNTER — Telehealth: Payer: Self-pay | Admitting: Nutrition

## 2015-01-03 ENCOUNTER — Encounter: Payer: Self-pay | Admitting: Family Medicine

## 2015-01-03 ENCOUNTER — Ambulatory Visit: Payer: Medicare Other | Admitting: Nutrition

## 2015-01-03 DIAGNOSIS — L03116 Cellulitis of left lower limb: Secondary | ICD-10-CM | POA: Insufficient documentation

## 2015-01-03 DIAGNOSIS — W57XXXA Bitten or stung by nonvenomous insect and other nonvenomous arthropods, initial encounter: Secondary | ICD-10-CM | POA: Insufficient documentation

## 2015-01-03 NOTE — Telephone Encounter (Signed)
Called and rescheduled missed appointment. PC

## 2015-01-03 NOTE — Assessment & Plan Note (Signed)
Well controlled,  weight loss is great, continue with nutrition, goal < 7

## 2015-01-03 NOTE — Assessment & Plan Note (Signed)
Continue phenetermine no SE with medication

## 2015-01-03 NOTE — Assessment & Plan Note (Signed)
Sodium intake still an issue, discussed the frozen meals and canned goods to avoid  will also give compression hose to use  Continue lasix

## 2015-01-03 NOTE — Assessment & Plan Note (Signed)
Diabetic shoes ordered

## 2015-01-03 NOTE — Assessment & Plan Note (Signed)
Multiple bug bites on skin with superinfection, signs of celluliits on left lower leg, bactrim given Also treat with permethrin all over

## 2015-01-03 NOTE — Assessment & Plan Note (Signed)
Well controlled 

## 2015-01-04 ENCOUNTER — Other Ambulatory Visit: Payer: Self-pay | Admitting: *Deleted

## 2015-01-09 ENCOUNTER — Other Ambulatory Visit: Payer: Self-pay | Admitting: Family Medicine

## 2015-01-09 ENCOUNTER — Encounter (HOSPITAL_COMMUNITY)
Admission: RE | Disposition: A | Discharge status: Home Health | Payer: Self-pay | Source: Ambulatory Visit | Attending: Internal Medicine

## 2015-01-09 ENCOUNTER — Ambulatory Visit (HOSPITAL_COMMUNITY)
Admission: RE | Admit: 2015-01-09 | Discharge: 2015-01-09 | Disposition: A | Discharge status: Home Health | Payer: Medicare Other | Source: Ambulatory Visit | Attending: Internal Medicine | Admitting: Internal Medicine

## 2015-01-09 ENCOUNTER — Encounter (HOSPITAL_COMMUNITY): Payer: Self-pay

## 2015-01-09 DIAGNOSIS — Z86711 Personal history of pulmonary embolism: Secondary | ICD-10-CM | POA: Insufficient documentation

## 2015-01-09 DIAGNOSIS — E119 Type 2 diabetes mellitus without complications: Secondary | ICD-10-CM | POA: Insufficient documentation

## 2015-01-09 DIAGNOSIS — Z86010 Personal history of colonic polyps: Secondary | ICD-10-CM | POA: Insufficient documentation

## 2015-01-09 DIAGNOSIS — R1310 Dysphagia, unspecified: Secondary | ICD-10-CM | POA: Diagnosis not present

## 2015-01-09 DIAGNOSIS — E079 Disorder of thyroid, unspecified: Secondary | ICD-10-CM | POA: Diagnosis not present

## 2015-01-09 DIAGNOSIS — Z7984 Long term (current) use of oral hypoglycemic drugs: Secondary | ICD-10-CM | POA: Insufficient documentation

## 2015-01-09 DIAGNOSIS — Z79899 Other long term (current) drug therapy: Secondary | ICD-10-CM | POA: Insufficient documentation

## 2015-01-09 DIAGNOSIS — K219 Gastro-esophageal reflux disease without esophagitis: Secondary | ICD-10-CM | POA: Insufficient documentation

## 2015-01-09 DIAGNOSIS — Z800 Family history of malignant neoplasm of digestive organs: Secondary | ICD-10-CM | POA: Diagnosis not present

## 2015-01-09 DIAGNOSIS — D509 Iron deficiency anemia, unspecified: Secondary | ICD-10-CM

## 2015-01-09 DIAGNOSIS — K229 Disease of esophagus, unspecified: Secondary | ICD-10-CM | POA: Diagnosis not present

## 2015-01-09 DIAGNOSIS — I10 Essential (primary) hypertension: Secondary | ICD-10-CM | POA: Insufficient documentation

## 2015-01-09 DIAGNOSIS — K317 Polyp of stomach and duodenum: Secondary | ICD-10-CM | POA: Diagnosis not present

## 2015-01-09 DIAGNOSIS — E039 Hypothyroidism, unspecified: Secondary | ICD-10-CM | POA: Insufficient documentation

## 2015-01-09 DIAGNOSIS — K2289 Other specified disease of esophagus: Secondary | ICD-10-CM | POA: Insufficient documentation

## 2015-01-09 DIAGNOSIS — Q438 Other specified congenital malformations of intestine: Secondary | ICD-10-CM | POA: Diagnosis not present

## 2015-01-09 DIAGNOSIS — K319 Disease of stomach and duodenum, unspecified: Secondary | ICD-10-CM | POA: Insufficient documentation

## 2015-01-09 DIAGNOSIS — K5730 Diverticulosis of large intestine without perforation or abscess without bleeding: Secondary | ICD-10-CM | POA: Diagnosis not present

## 2015-01-09 DIAGNOSIS — K3189 Other diseases of stomach and duodenum: Secondary | ICD-10-CM | POA: Diagnosis not present

## 2015-01-09 DIAGNOSIS — K2100 Gastro-esophageal reflux disease with esophagitis, without bleeding: Secondary | ICD-10-CM | POA: Insufficient documentation

## 2015-01-09 DIAGNOSIS — D123 Benign neoplasm of transverse colon: Secondary | ICD-10-CM | POA: Diagnosis not present

## 2015-01-09 DIAGNOSIS — F419 Anxiety disorder, unspecified: Secondary | ICD-10-CM | POA: Insufficient documentation

## 2015-01-09 DIAGNOSIS — K208 Other esophagitis: Secondary | ICD-10-CM | POA: Diagnosis not present

## 2015-01-09 DIAGNOSIS — D649 Anemia, unspecified: Secondary | ICD-10-CM

## 2015-01-09 HISTORY — PX: ESOPHAGOGASTRODUODENOSCOPY: SHX5428

## 2015-01-09 HISTORY — PX: COLONOSCOPY: SHX5424

## 2015-01-09 HISTORY — PX: ESOPHAGEAL DILATION: SHX303

## 2015-01-09 LAB — GLUCOSE, CAPILLARY: Glucose-Capillary: 91 mg/dL (ref 65–99)

## 2015-01-09 SURGERY — COLONOSCOPY
Anesthesia: Moderate Sedation

## 2015-01-09 MED ORDER — MEPERIDINE HCL 100 MG/ML IJ SOLN
INTRAMUSCULAR | Status: DC | PRN
  Administered 2015-01-09 (×5): 25 mg via INTRAVENOUS

## 2015-01-09 MED ORDER — LIDOCAINE VISCOUS 2 % MT SOLN
OROMUCOSAL | Status: AC
  Filled 2015-01-09: qty 15

## 2015-01-09 MED ORDER — SIMETHICONE 40 MG/0.6ML PO SUSP
ORAL | Status: DC | PRN
  Administered 2015-01-09: 11:00:00

## 2015-01-09 MED ORDER — MIDAZOLAM HCL 5 MG/5ML IJ SOLN
INTRAMUSCULAR | Status: AC
  Filled 2015-01-09: qty 10

## 2015-01-09 MED ORDER — ONDANSETRON HCL 4 MG/2ML IJ SOLN
INTRAMUSCULAR | Status: AC
  Filled 2015-01-09: qty 2

## 2015-01-09 MED ORDER — ONDANSETRON HCL 4 MG/2ML IJ SOLN
INTRAMUSCULAR | Status: DC | PRN
Start: 2015-01-09 — End: 2015-01-09
  Administered 2015-01-09: 4 mg via INTRAVENOUS

## 2015-01-09 MED ORDER — SODIUM CHLORIDE 0.9 % IV SOLN
INTRAVENOUS | Status: DC
  Administered 2015-01-09: 10:00:00 via INTRAVENOUS

## 2015-01-09 MED ORDER — DEXTROSE 50 % IV SOLN
INTRAVENOUS | Status: AC
  Filled 2015-01-09: qty 50

## 2015-01-09 MED ORDER — DEXTROSE 250 MG/ML IV SOLN
25.0000 mg | Freq: Once | INTRAVENOUS | Status: AC
  Administered 2015-01-09: 25 mg via INTRAVENOUS
  Filled 2015-01-09: qty 10

## 2015-01-09 MED ORDER — MEPERIDINE HCL 100 MG/ML IJ SOLN
INTRAMUSCULAR | Status: AC
  Filled 2015-01-09: qty 2

## 2015-01-09 MED ORDER — LIDOCAINE VISCOUS 2 % MT SOLN
OROMUCOSAL | Status: DC | PRN
  Administered 2015-01-09: 3 mL via OROMUCOSAL

## 2015-01-09 MED ORDER — MIDAZOLAM HCL 5 MG/5ML IJ SOLN
INTRAMUSCULAR | Status: DC | PRN
  Administered 2015-01-09: 1 mg via INTRAVENOUS
  Administered 2015-01-09: 2 mg via INTRAVENOUS
  Administered 2015-01-09 (×3): 1 mg via INTRAVENOUS
  Administered 2015-01-09: 2 mg via INTRAVENOUS

## 2015-01-09 NOTE — Op Note (Signed)
[facility] [address] [place], [address]   ENDOSCOPY PROCEDURE REPORT  PATIENT: Yesenia, [name]  MR#: [id number] BIRTHDATE: 03-30-1951 , 64  yrs. old GENDER: female ENDOSCOPIST: R.  Garfield Cornea, MD FACP FACG REFERRED BY:  Vic Blackbird, M.D. PROCEDURE DATE:  February 02, 2015 PROCEDURE:  EGD w/ biopsy and Maloney dilation of esophagus INDICATIONS:  esophageal dysphagia; GERD. MEDICATIONS: Versed 6 mg IV and Demerol 75 mg IV in divided doses. Zofran 4 mg IV.  Xylocaine gel orally. ASA CLASS:      Class III  CONSENT: The risks, benefits, limitations, alternatives and imponderables have been discussed.  The potential for biopsy, esophogeal dilation, etc. have also been reviewed.  Questions have been answered.  All parties agreeable.  Please see the history and physical in the medical record for more information.  DESCRIPTION OF PROCEDURE: After the risks benefits and alternatives of the procedure were thoroughly explained, informed consent was obtained.  The EG-[id number] ([id number]) endoscope was introduced through the mouth and advanced to the second portion of the duodenum , limited by Without limitations. The instrument was slowly withdrawn as the mucosa was fully examined. Estimated blood loss is zero unless otherwise noted in this procedure report.    Patent tubular esophagus.  Distal esophageal erosions. Salmon-colored epithelium coming up 2 cm in (2) broad "tongues" on opposite walls of the esophagus.  5 mm distal esophageal erosions proximal to this abnormality.  No nodularity.  Stomach empty.  3 cm hiatal hernia.  Polypoid antral mucosa noted. No ulcer or infiltrating process.  Patent pylorus. Normal-appearing first and second portion of the duodenum  A 54 French Maloney dilator was passed to full insertion easily.  A look back revealed no apparent complication related to this maneuver.  The polypoid gastric mucosa was biopsied.  The abnormal distal  esophagus was also biopsied separately.         The scope was then withdrawn from the patient and the procedure completed.  COMPLICATIONS: There were no immediate complications. EBL 5 mL ENDOSCOPIC IMPRESSION: Abnormal distal esophagus suspicious for short segment Barrett's esophagus. Mild erosive reflux esophagitis. Status post passage of a Maloney dilator. Hiatal hernia. Gastric nodularity?"status post gastric and esophageal biopsy  RECOMMENDATIONS: Continue Dexilant 60 mg daily. Follow up on pathology. See colonoscopy report.  REPEAT EXAM:  eSigned:  R. Garfield Cornea, MD [facility] 02/02/15 11:05 AM    CC:  CPT CODES: ICD CODES:  The ICD and CPT codes recommended by this software are interpretations from the data that the clinical staff has captured with the software.  The verification of the translation of this report to the ICD and CPT codes and modifiers is the sole responsibility of the health care institution and practicing physician where this report was generated.  [employer] will not be held responsible for the validity of the ICD and CPT codes included on this report.  AMA assumes no liability for data contained or not contained herein. CPT is a Designer, television/film set of the [employer].  PATIENT NAME:  Yesenia, [name] MR#: [id number]
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
## 2015-01-09 NOTE — Op Note (Signed)
[facility] [address] [place], [address]   COLONOSCOPY PROCEDURE REPORT  PATIENT: Yesenia [name]  MR#: [id number] BIRTHDATE: 1951/04/01 , 64  yrs. old GENDER: female ENDOSCOPIST: R.  Garfield Cornea, MD FACP [facility] REFERRED [id number] Leonore, M.D. PROCEDURE DATE:  Jan 23, 2015 PROCEDURE:   Colonoscopy with snare polypectomy INDICATIONS:iron deficiency anemia; positive family history of colon cancer. MEDICATIONS: Versed 8 mg IV and Demerol 125 mg IV in divided doses. Zofran 4 mg IV. ASA CLASS:       Class III  CONSENT: The risks, benefits, alternatives and imponderables including but not limited to bleeding, perforation as well as the possibility of a missed lesion have been reviewed.  The potential for biopsy, lesion removal, etc. have also been discussed. Questions have been answered.  All parties agreeable.  Please see the history and physical in the medical record for more information.  DESCRIPTION OF PROCEDURE:   After the risks benefits and alternatives of the procedure were thoroughly explained, informed consent was obtained.  The digital rectal exam revealed no abnormalities of the rectum.   The [id number] ([id number])  endoscope was introduced through the anus and advanced to the terminal ileum which was intubated for a short distance. No adverse events experienced.   The quality of the prep was adequate  The instrument was then slowly withdrawn as the colon was fully examined. Estimated blood loss is zero unless otherwise noted in this procedure report.      COLON FINDINGS: Internal hemorrhoids and anal papilla; otherwise, normal rectum.  Redundant colon.  Numerous left-sided diverticula; (1) 6 mm polyp at the splenic flexure; otherwise, the remainder of the colonic mucosa appeared normal.  The distal 15 cm of terminal ileal mucosa also appeared normal.  The above-mentioned polyp was cold snare removed and recovered for the pathologist.   Retroflexion was performed. .  Withdrawal time=9 minutes 0 seconds.  The scope was withdrawn and the procedure completed. COMPLICATIONS: There were no immediate complications. EBL 5 mL ENDOSCOPIC IMPRESSION: Colonic diverticulosis. Redundant colon. Single colonic polyp?"removed as described above  RECOMMENDATIONS: Follow up on pathology. See EGD report.  eSigned:  R. Garfield Cornea, MD [facility]al[facility] 01-23-2015 11:31 AM   cc:  CPT CODES: ICD CODES:  The ICD and CPT codes recommended by this software are interpretations from the data that the clinical staff has captured with the software.  The verification of the translation of this report to the ICD and CPT codes and modifiers is the sole responsibility of the health care institution and practicing physician where this report was generated.  [employer]. will not be held responsible for the validity of the ICD and CPT codes included on this report.  AMA assumes no liability for data contained or not contained herein. CPT is a Designer, television/film set of the [employer].  PATIENT NAME:  Yesenia [name], Yesenia [name] MR#: [id number]

## 2015-01-09 NOTE — Interval H&P Note (Signed)
History and Physical Interval Note:  01/09/2015 10:29 AM  Yesenia [name]  has presented today for surgery, with the diagnosis of family history of colon cancer/anemia  The various methods of treatment have been discussed with the patient and family. After consideration of risks, benefits and other options for treatment, the patient has consented to  Procedure(s) with comments: COLONOSCOPY (N/A) - 930 - moved to 9:55 - office to notify ESOPHAGOGASTRODUODENOSCOPY (EGD) (N/A) ESOPHAGEAL DILATION (N/A) as a surgical intervention .  The patient's history has been reviewed, patient examined, no change in status, stable for surgery.  I have reviewed the patient's chart and labs.  Questions were answered to the patient's satisfaction.     Yesenia [name]  No change. EGD with ED and colonoscopy per plan.  The risks, benefits, limitations, imponderables and alternatives regarding both EGD and colonoscopy have been reviewed with the patient. Questions have been answered. All parties agreeable.

## 2015-01-09 NOTE — Telephone Encounter (Signed)
Refill appropriate and filled per protocol. 

## 2015-01-09 NOTE — Progress Notes (Signed)
Spoke to Dr. Gala Romney about patient's blood glucose. Gave a regular Coke to drink. Patient instructed to eat when returning home. Patient alert with no symptoms upon leaving.

## 2015-01-09 NOTE — Discharge Instructions (Signed)
Colonoscopy Discharge Instructions  Read the instructions outlined below and refer to this sheet in the next few weeks. These discharge instructions provide you with general information on caring for yourself after you leave the hospital. Your doctor may also give you specific instructions. While your treatment has been planned according to the most current medical practices available, unavoidable complications occasionally occur. If you have any problems or questions after discharge, call Dr. Gala Romney at [phone]. ACTIVITY  You may resume your regular activity, but move at a slower pace for the next 24 hours.   Take frequent rest periods for the next 24 hours.   Walking will help get rid of the air and reduce the bloated feeling in your belly (abdomen).   No driving for 24 hours (because of the medicine (anesthesia) used during the test).    Do not sign any important legal documents or operate any machinery for 24 hours (because of the anesthesia used during the test).  NUTRITION  Drink plenty of fluids.   You may resume your normal diet as instructed by your doctor.   Begin with a light meal and progress to your normal diet. Heavy or fried foods are harder to digest and may make you feel sick to your stomach (nauseated).   Avoid alcoholic beverages for 24 hours or as instructed.  MEDICATIONS  You may resume your normal medications unless your doctor tells you otherwise.  WHAT YOU CAN EXPECT TODAY  Some feelings of bloating in the abdomen.   Passage of more gas than usual.   Spotting of blood in your stool or on the toilet paper.  IF YOU HAD POLYPS REMOVED DURING THE COLONOSCOPY:  No aspirin products for 7 days or as instructed.   No alcohol for 7 days or as instructed.   Eat a soft diet for the next 24 hours.  FINDING OUT THE RESULTS OF YOUR TEST Not all test results are available during your visit. If your test results are not back during the visit, make an appointment  with your caregiver to find out the results. Do not assume everything is normal if you have not heard from your caregiver or the medical facility. It is important for you to follow up on all of your test results.  SEEK IMMEDIATE MEDICAL ATTENTION IF:  You have more than a spotting of blood in your stool.   Your belly is swollen (abdominal distention).   You are nauseated or vomiting.   You have a temperature over 101.  You have abdominal pain or discomfort that is severe or gets worse throughout the day. EGD Discharge instructions Please read the instructions outlined below and refer to this sheet in the next few weeks. These discharge instructions provide you with general information on caring for yourself after you leave the hospital. Your doctor may also give you specific instructions. While your treatment has been planned according to the most current medical practices available, unavoidable complications occasionally occur. If you have any problems or questions after discharge, please call your doctor. ACTIVITY You may resume your regular activity but move at a slower pace for the next 24 hours.  Take frequent rest periods for the next 24 hours.  Walking will help expel (get rid of) the air and reduce the bloated feeling in your abdomen.  No driving for 24 hours (because of the anesthesia (medicine) used during the test).  You may shower.  Do not sign any important legal documents or operate any machinery for 24  hours (because of the anesthesia used during the test).  NUTRITION Drink plenty of fluids.  You may resume your normal diet.  Begin with a light meal and progress to your normal diet.  Avoid alcoholic beverages for 24 hours or as instructed by your caregiver.  MEDICATIONS You may resume your normal medications unless your caregiver tells you otherwise.  WHAT YOU CAN EXPECT TODAY You may experience abdominal discomfort such as a feeling of fullness or gas pains.   FOLLOW-UP Your doctor will discuss the results of your test with you.  SEEK IMMEDIATE MEDICAL ATTENTION IF ANY OF THE FOLLOWING OCCUR: Excessive nausea (feeling sick to your stomach) and/or vomiting.  Severe abdominal pain and distention (swelling).  Trouble swallowing.  Temperature over 101 F (37.8 C).  Rectal bleeding or vomiting of blood.    Colon polyp, diverticulosis and GERD information provided  Continue Dexilant60 mg daily  Further recommendations to follow pending review of pathology report  Gastroesophageal Reflux Disease, Adult Normally, food travels down the esophagus and stays in the stomach to be digested. However, when a person has gastroesophageal reflux disease (GERD), food and stomach acid move back up into the esophagus. When this happens, the esophagus becomes sore and inflamed. Over time, GERD can create small holes (ulcers) in the lining of the esophagus.  CAUSES This condition is caused by a problem with the muscle between the esophagus and the stomach (lower esophageal sphincter, or LES). Normally, the LES muscle closes after food passes through the esophagus to the stomach. When the LES is weakened or abnormal, it does not close properly, and that allows food and stomach acid to go back up into the esophagus. The LES can be weakened by certain dietary substances, medicines, and medical conditions, including:  Tobacco use.  Pregnancy.  Having a hiatal hernia.  Heavy alcohol use.  Certain foods and beverages, such as coffee, chocolate, onions, and peppermint. RISK FACTORS This condition is more likely to develop in:  People who have an increased body weight.  People who have connective tissue disorders.  People who use NSAID medicines. SYMPTOMS Symptoms of this condition include:  Heartburn.  Difficult or painful swallowing.  The feeling of having a lump in the throat.  Abitter taste in the mouth.  Bad breath.  Having a large amount of  saliva.  Having an upset or bloated stomach.  Belching.  Chest pain.  Shortness of breath or wheezing.  Ongoing (chronic) cough or a night-time cough.  Wearing away of tooth enamel.  Weight loss. Different conditions can cause chest pain. Make sure to see your health care provider if you experience chest pain. DIAGNOSIS Your health care provider will take a medical history and perform a physical exam. To determine if you have mild or severe GERD, your health care provider may also monitor how you respond to treatment. You may also have other tests, including:  An endoscopy toexamine your stomach and esophagus with a small camera.  A test thatmeasures the acidity level in your esophagus.  A test thatmeasures how much pressure is on your esophagus.  A barium swallow or modified barium swallow to show the shape, size, and functioning of your esophagus. TREATMENT The goal of treatment is to help relieve your symptoms and to prevent complications. Treatment for this condition may vary depending on how severe your symptoms are. Your health care provider may recommend:  Changes to your diet.  Medicine.  Surgery. HOME CARE INSTRUCTIONS Diet  Follow a diet as recommended  by your health care provider. This may involve avoiding foods and drinks such as:  Coffee and tea (with or without caffeine).  Drinks that containalcohol.  Energy drinks and sports drinks.  Carbonated drinks or sodas.  Chocolate and cocoa.  Peppermint and mint flavorings.  Garlic and onions.  Horseradish.  Spicy and acidic foods, including peppers, chili powder, curry powder, vinegar, hot sauces, and barbecue sauce.  Citrus fruit juices and citrus fruits, such as oranges, lemons, and limes.  Tomato-based foods, such as red sauce, chili, salsa, and pizza with red sauce.  Fried and fatty foods, such as donuts, french fries, potato chips, and high-fat dressings.  High-fat meats, such as hot dogs  and fatty cuts of red and white meats, such as rib eye steak, sausage, ham, and bacon.  High-fat dairy items, such as whole milk, butter, and cream cheese.  Eat small, frequent meals instead of large meals.  Avoid drinking large amounts of liquid with your meals.  Avoid eating meals during the 2-3 hours before bedtime.  Avoid lying down right after you eat.  Do not exercise right after you eat. General Instructions  Pay attention to any changes in your symptoms.  Take over-the-counter and prescription medicines only as told by your health care provider. Do not take aspirin, ibuprofen, or other NSAIDs unless your health care provider told you to do so.  Do not use any tobacco products, including cigarettes, chewing tobacco, and e-cigarettes. If you need help quitting, ask your health care provider.  Wear loose-fitting clothing. Do not wear anything tight around your waist that causes pressure on your abdomen.  Raise (elevate) the head of your bed 6 inches (15cm).  Try to reduce your stress, such as with yoga or meditation. If you need help reducing stress, ask your health care provider.  If you are overweight, reduce your weight to an amount that is healthy for you. Ask your health care provider for guidance about a safe weight loss goal.  Keep all follow-up visits as told by your health care provider. This is important. SEEK MEDICAL CARE IF:  You have new symptoms.  You have unexplained weight loss.  You have difficulty swallowing, or it hurts to swallow.  You have wheezing or a persistent cough.  Your symptoms do not improve with treatment.  You have a hoarse voice. SEEK IMMEDIATE MEDICAL CARE IF:  You have pain in your arms, neck, jaw, teeth, or back.  You feel sweaty, dizzy, or light-headed.  You have chest pain or shortness of breath.  You vomit and your vomit looks like blood or coffee grounds.  You faint.  Your stool is bloody or black.  You cannot  swallow, drink, or eat.   This information is not intended to replace advice given to you by your health care provider. Make sure you discuss any questions you have with your health care provider.   Document Released: 12/31/2004 Document Revised: 12/12/2014 Document Reviewed: 07/18/2014 Elsevier Interactive Patient Education [employer].  Diverticulosis Diverticulosis is the condition that develops when small pouches (diverticula) form in the wall of your colon. Your colon, or large intestine, is where water is absorbed and stool is formed. The pouches form when the inside layer of your colon pushes through weak spots in the outer layers of your colon. CAUSES  No one knows exactly what causes diverticulosis. RISK FACTORS  Being older than 73. Your risk for this condition increases with age. Diverticulosis is rare in people younger than 53  years. By age 23, almost everyone has it.  Eating a low-fiber diet.  Being frequently constipated.  Being overweight.  Not getting enough exercise.  Smoking.  Taking over-the-counter pain medicines, like aspirin and ibuprofen. SYMPTOMS  Most people with diverticulosis do not have symptoms. DIAGNOSIS  Because diverticulosis often has no symptoms, health care providers often discover the condition during an exam for other colon problems. In many cases, a health care provider will diagnose diverticulosis while using a flexible scope to examine the colon (colonoscopy). TREATMENT  If you have never developed an infection related to diverticulosis, you may not need treatment. If you have had an infection before, treatment may include:  Eating more fruits, vegetables, and grains.  Taking a fiber supplement.  Taking a live bacteria supplement (probiotic).  Taking medicine to relax your colon. HOME CARE INSTRUCTIONS   Drink at least 6-8 glasses of water each day to prevent constipation.  Try not to strain when you have a bowel  movement.  Keep all follow-up appointments. If you have had an infection before:  Increase the fiber in your diet as directed by your health care provider or dietitian.  Take a dietary fiber supplement if your health care provider approves.  Only take medicines as directed by your health care provider. SEEK MEDICAL CARE IF:   You have abdominal pain.  You have bloating.  You have cramps.  You have not gone to the bathroom in 3 days. SEEK IMMEDIATE MEDICAL CARE IF:   Your pain gets worse.  Yourbloating becomes very bad.  You have a fever or chills, and your symptoms suddenly get worse.  You begin vomiting.  You have bowel movements that are bloody or black. MAKE SURE YOU:  Understand these instructions.  Will watch your condition.  Will get help right away if you are not doing well or get worse.   This information is not intended to replace advice given to you by your health care provider. Make sure you discuss any questions you have with your health care provider.   Document Released: 12/19/2003 Document Revised: 03/28/2013 Document Reviewed: 02/15/2013 Elsevier Interactive Patient Education 2016 [employer]  Colon Polyps Polyps are lumps of extra tissue growing inside the body. Polyps can grow in the large intestine (colon). Most colon polyps are noncancerous (benign). However, some colon polyps can become cancerous over time. Polyps that are larger than a pea may be harmful. To be safe, caregivers remove and test all polyps. CAUSES  Polyps form when mutations in the genes cause your cells to grow and divide even though no more tissue is needed. RISK FACTORS There are a number of risk factors that can increase your chances of getting colon polyps. They include:  Being older than 50 years.  Family history of colon polyps or colon cancer.  Long-term colon diseases, such as colitis or Crohn disease.  Being overweight.  Smoking.  Being  inactive.  Drinking too much alcohol. SYMPTOMS  Most small polyps do not cause symptoms. If symptoms are present, they may include:  Blood in the stool. The stool may look dark red or black.  Constipation or diarrhea that lasts longer than 1 week. DIAGNOSIS People often do not know they have polyps until their caregiver finds them during a regular checkup. Your caregiver can use 4 tests to check for polyps:  Digital rectal exam. The caregiver wears gloves and feels inside the rectum. This test would find polyps only in the rectum.  Barium enema. The caregiver  puts a liquid called barium into your rectum before taking X-rays of your colon. Barium makes your colon look white. Polyps are dark, so they are easy to see in the X-ray pictures.  Sigmoidoscopy. A thin, flexible tube (sigmoidoscope) is placed into your rectum. The sigmoidoscope has a light and tiny camera in it. The caregiver uses the sigmoidoscope to look at the last third of your colon.  Colonoscopy. This test is like sigmoidoscopy, but the caregiver looks at the entire colon. This is the most common method for finding and removing polyps. TREATMENT  Any polyps will be removed during a sigmoidoscopy or colonoscopy. The polyps are then tested for cancer. PREVENTION  To help lower your risk of getting more colon polyps:  Eat plenty of fruits and vegetables. Avoid eating fatty foods.  Do not smoke.  Avoid drinking alcohol.  Exercise every day.  Lose weight if recommended by your caregiver.  Eat plenty of calcium and folate. Foods that are rich in calcium include milk, cheese, and broccoli. Foods that are rich in folate include chickpeas, kidney beans, and spinach. HOME CARE INSTRUCTIONS Keep all follow-up appointments as directed by your caregiver. You may need periodic exams to check for polyps. SEEK MEDICAL CARE IF: You notice bleeding during a bowel movement.   This information is not intended to replace advice given  to you by your health care provider. Make sure you discuss any questions you have with your health care provider.   Document Released: 12/18/2003 Document Revised: 04/13/2014 Document Reviewed: 06/02/2011 Elsevier Interactive Patient Education [employer].

## 2015-01-10 NOTE — Telephone Encounter (Signed)
Refill appropriate and filled per protocol. 

## 2015-01-11 ENCOUNTER — Other Ambulatory Visit: Payer: Self-pay | Admitting: Family Medicine

## 2015-01-11 ENCOUNTER — Encounter: Payer: Self-pay | Admitting: Internal Medicine

## 2015-01-11 NOTE — Telephone Encounter (Signed)
Refill appropriate and filled per protocol. 

## 2015-01-11 NOTE — Telephone Encounter (Signed)
Medication refilled per protocol. 

## 2015-01-15 ENCOUNTER — Encounter: Payer: Self-pay | Admitting: Internal Medicine

## 2015-01-15 ENCOUNTER — Encounter (HOSPITAL_COMMUNITY): Payer: Self-pay | Admitting: Internal Medicine

## 2015-01-15 ENCOUNTER — Telehealth: Payer: Self-pay

## 2015-01-15 NOTE — Telephone Encounter (Signed)
Letter mailed to the pt.  Lab order on file. Please schedule ov

## 2015-01-15 NOTE — Telephone Encounter (Signed)
Per RMR- Send letter to patient.  Send copy of letter with path to referring provider and PCP.   Needs CBC and office visit with extender in 3 months to reassess symptoms/anemia

## 2015-01-15 NOTE — Telephone Encounter (Signed)
APPOINTMENT MADE AND LETTER SENT °

## 2015-01-21 DIAGNOSIS — M5136 Other intervertebral disc degeneration, lumbar region: Secondary | ICD-10-CM | POA: Diagnosis not present

## 2015-01-21 DIAGNOSIS — M47816 Spondylosis without myelopathy or radiculopathy, lumbar region: Secondary | ICD-10-CM | POA: Diagnosis not present

## 2015-01-21 DIAGNOSIS — M797 Fibromyalgia: Secondary | ICD-10-CM | POA: Diagnosis not present

## 2015-01-22 ENCOUNTER — Other Ambulatory Visit: Payer: Self-pay | Admitting: Family Medicine

## 2015-01-22 NOTE — Telephone Encounter (Signed)
Refill appropriate and filled per protocol. 

## 2015-01-25 ENCOUNTER — Ambulatory Visit: Payer: Medicare Other | Admitting: Family Medicine

## 2015-02-06 ENCOUNTER — Other Ambulatory Visit: Payer: Self-pay | Admitting: Family Medicine

## 2015-02-06 NOTE — Telephone Encounter (Signed)
Ok to refill Lyrica??  Last office visit 01/02/2015.  Last refill 11/06/2014, #3 refills.

## 2015-02-06 NOTE — Telephone Encounter (Signed)
Medication called to pharmacy. 

## 2015-02-06 NOTE — Telephone Encounter (Signed)
okay

## 2015-02-08 ENCOUNTER — Ambulatory Visit: Payer: Medicare Other | Admitting: Nutrition

## 2015-02-12 DIAGNOSIS — M797 Fibromyalgia: Secondary | ICD-10-CM | POA: Diagnosis not present

## 2015-02-12 DIAGNOSIS — Z885 Allergy status to narcotic agent status: Secondary | ICD-10-CM | POA: Diagnosis not present

## 2015-02-12 DIAGNOSIS — M5136 Other intervertebral disc degeneration, lumbar region: Secondary | ICD-10-CM | POA: Diagnosis not present

## 2015-02-12 DIAGNOSIS — E785 Hyperlipidemia, unspecified: Secondary | ICD-10-CM | POA: Diagnosis not present

## 2015-02-12 DIAGNOSIS — E1140 Type 2 diabetes mellitus with diabetic neuropathy, unspecified: Secondary | ICD-10-CM | POA: Diagnosis not present

## 2015-02-12 DIAGNOSIS — M47816 Spondylosis without myelopathy or radiculopathy, lumbar region: Secondary | ICD-10-CM | POA: Diagnosis not present

## 2015-02-12 DIAGNOSIS — I10 Essential (primary) hypertension: Secondary | ICD-10-CM | POA: Diagnosis not present

## 2015-02-12 DIAGNOSIS — E039 Hypothyroidism, unspecified: Secondary | ICD-10-CM | POA: Diagnosis not present

## 2015-02-12 DIAGNOSIS — Z86718 Personal history of other venous thrombosis and embolism: Secondary | ICD-10-CM | POA: Diagnosis not present

## 2015-02-12 DIAGNOSIS — Z86711 Personal history of pulmonary embolism: Secondary | ICD-10-CM | POA: Diagnosis not present

## 2015-02-12 DIAGNOSIS — F419 Anxiety disorder, unspecified: Secondary | ICD-10-CM | POA: Diagnosis not present

## 2015-02-12 DIAGNOSIS — G8929 Other chronic pain: Secondary | ICD-10-CM | POA: Diagnosis not present

## 2015-02-12 DIAGNOSIS — F329 Major depressive disorder, single episode, unspecified: Secondary | ICD-10-CM | POA: Diagnosis not present

## 2015-02-12 DIAGNOSIS — Z79899 Other long term (current) drug therapy: Secondary | ICD-10-CM | POA: Diagnosis not present

## 2015-02-12 DIAGNOSIS — Z7984 Long term (current) use of oral hypoglycemic drugs: Secondary | ICD-10-CM | POA: Diagnosis not present

## 2015-02-12 DIAGNOSIS — Z888 Allergy status to other drugs, medicaments and biological substances status: Secondary | ICD-10-CM | POA: Diagnosis not present

## 2015-02-20 ENCOUNTER — Ambulatory Visit (INDEPENDENT_AMBULATORY_CARE_PROVIDER_SITE_OTHER): Payer: Medicare Other | Admitting: Family Medicine

## 2015-02-20 ENCOUNTER — Encounter: Payer: Self-pay | Admitting: Family Medicine

## 2015-02-20 VITALS — BP 132/64 | HR 74 | Temp 97.9°F | Resp 16 | Ht 61.0 in | Wt 245.0 lb

## 2015-02-20 DIAGNOSIS — R0981 Nasal congestion: Secondary | ICD-10-CM | POA: Diagnosis not present

## 2015-02-20 DIAGNOSIS — R21 Rash and other nonspecific skin eruption: Secondary | ICD-10-CM

## 2015-02-20 DIAGNOSIS — H8110 Benign paroxysmal vertigo, unspecified ear: Secondary | ICD-10-CM

## 2015-02-20 MED ORDER — HYDROXYZINE HCL 25 MG PO TABS: 25.0000 mg | ORAL_TABLET | Freq: Three times a day (TID) | ORAL | Status: DC | PRN

## 2015-02-20 MED ORDER — HYDROCODONE-ACETAMINOPHEN 10-325 MG PO TABS: 1.0000 | ORAL_TABLET | Freq: Four times a day (QID) | ORAL | Status: DC | PRN

## 2015-02-20 MED ORDER — SULFAMETHOXAZOLE-TRIMETHOPRIM 800-160 MG PO TABS: 1.0000 | ORAL_TABLET | Freq: Two times a day (BID) | ORAL | Status: DC

## 2015-02-20 MED ORDER — DULOXETINE HCL 20 MG PO CPEP: 20.0000 mg | ORAL_CAPSULE | Freq: Two times a day (BID) | ORAL | Status: DC

## 2015-02-20 NOTE — Progress Notes (Signed)
Patient ID: Yesenia [name], female   DOB: [birth year], 64 y.o.   MRN: [id number]   Subjective:    Patient ID: Yesenia [name], female    DOB: 1951-03-31, 64 y.o.   MRN: [id number]  Patient presents for Skin Irritation   patient here with worsening rash with scabs. She finds herself scratching even in her sleep when I saw her back in September she thinks that she picked up something from her mother's house at that time she had what look like bites on her legs with a few scabs in her abdomen now she has lesions of bilateral arms as well as upper part of her back in order for hit. She states that she just calls all the time because she is itching. I did prescribe her Bactrim would help dry up everything she also used permethrin and has been using topical cortisone with minimal improvement. He is not on any new medications no changes in detergent.  She has noticed a couple episodes if she bends over very quickly and stands up quick she will get a little dizzy headed. This does not last very long and nausea vomiting or headache associated.  She's had some nasal congestion for the past 2 days. No fever no cough associated.    Review Of Systems:  GEN- denies fatigue, fever, weight loss,weakness, recent illness HEENT- denies eye drainage, change in vision,+ nasal discharge, CVS- denies chest pain, palpitations RESP- denies SOB, cough, wheeze ABD- denies N/V, change in stools, abd pain GU- denies dysuria, hematuria, dribbling, incontinence MSK- + joint pain, muscle aches, injury Neuro- denies headache, dizziness, syncope, seizure activity       Objective:    BP 132/64 mmHg  Pulse 74  Temp(Src) 97.9 F (36.6 C) (Oral)  Resp 16  Ht 5\' 1"  (1.549 m)  Wt 245 lb (111.131 kg)  BMI 46.32 kg/m2 GEN- NAD, alert and oriented x3 HEENT- PERRL, EOMI, non injected sclera, pink conjunctiva, MMM, oropharynx clear, nares clear rhinorrhea, TM clear bilat no effusion, no maxillary sinus tenderness   Neck- Supple, CVS- RRR, no murmur RESP-CTAB Skin- Multiple erythematous maculoaapular lesions with scabs at center on abdomen- large one beneath umbilicus, Bilat arms, upper, back, small lesions on forehead, left lower leg- , no induratiuon, no fluctuance, bilat forearm, left shin with erythema over shin, 2 lesions on left forearm, open with significant erythema, no drainage, multiple excoriations EXT- non pitting edema Neuro-CNII-XII intact  Pulses- Radial, DP- 2+        Assessment & Plan:      Problem List Items Addressed This Visit    None    Visit Diagnoses    Rash and nonspecific skin eruption    -  Primary    unclear cause, initially though, she had bug bites, everything dried up after antibiotics but now with excoriations and more lesions, not ill appearing,no new medications. Unclear if just non healing from the persistent scratching at scabs. Some areas with significant redness, with her DM and history will give a few more days of antibiotics. Add atarax for itching, she has topical cream of vaseline/zinc and anti-bacterial soap. Refer to dermatology    Nasal congestion        nasal saline or flonase    BPV (benign positional vertigo), unspecified laterality        Mild vertigo, though she has many meds that can also contribute to dizziness. for now more precautions as short lived,meclizine prn if worsens  Note: This dictation was prepared with Dragon dictation along with smaller phrase technology. Any transcriptional errors that result from this process are unintentional.

## 2015-02-20 NOTE — Patient Instructions (Addendum)
Referral to dermatology- Dr. Tarri Glenn  Take the hydroxyzine for itching  Take antibiotics COntinue anti-bacterial wash Use the nasal saline or flonase for the sinuses F/U as previous

## 2015-02-21 ENCOUNTER — Telehealth: Payer: Self-pay | Admitting: *Deleted

## 2015-02-21 NOTE — Telephone Encounter (Signed)
Pt aware of appt.

## 2015-02-21 NOTE — Telephone Encounter (Signed)
Pt has appt scheduled for Mon Nov 21 at 2:15pm at [facility] with Dr. Nevada Crane, pt needs to be sure she goes to this appt, pt has been a NO SHOW couple times and if does not show or cancel they will not be able to schedule for future appt.   Tried calling on both mobile and home number no answer.

## 2015-02-25 DIAGNOSIS — L281 Prurigo nodularis: Secondary | ICD-10-CM | POA: Diagnosis not present

## 2015-03-02 ENCOUNTER — Emergency Department (HOSPITAL_COMMUNITY)
Admission: EM | Admit: 2015-03-02 | Discharge: 2015-03-02 | Disposition: A | Discharge status: Home / Self Care | Payer: Medicare Other | Attending: Emergency Medicine | Admitting: Emergency Medicine

## 2015-03-02 ENCOUNTER — Encounter (HOSPITAL_COMMUNITY): Payer: Self-pay | Admitting: Emergency Medicine

## 2015-03-02 DIAGNOSIS — E039 Hypothyroidism, unspecified: Secondary | ICD-10-CM | POA: Diagnosis not present

## 2015-03-02 DIAGNOSIS — Z87442 Personal history of urinary calculi: Secondary | ICD-10-CM | POA: Insufficient documentation

## 2015-03-02 DIAGNOSIS — R1110 Vomiting, unspecified: Secondary | ICD-10-CM | POA: Diagnosis not present

## 2015-03-02 DIAGNOSIS — Z79899 Other long term (current) drug therapy: Secondary | ICD-10-CM | POA: Insufficient documentation

## 2015-03-02 DIAGNOSIS — K219 Gastro-esophageal reflux disease without esophagitis: Secondary | ICD-10-CM | POA: Diagnosis not present

## 2015-03-02 DIAGNOSIS — M797 Fibromyalgia: Secondary | ICD-10-CM | POA: Diagnosis not present

## 2015-03-02 DIAGNOSIS — E119 Type 2 diabetes mellitus without complications: Secondary | ICD-10-CM | POA: Insufficient documentation

## 2015-03-02 DIAGNOSIS — Z8701 Personal history of pneumonia (recurrent): Secondary | ICD-10-CM | POA: Insufficient documentation

## 2015-03-02 DIAGNOSIS — I10 Essential (primary) hypertension: Secondary | ICD-10-CM | POA: Diagnosis not present

## 2015-03-02 DIAGNOSIS — Z86718 Personal history of other venous thrombosis and embolism: Secondary | ICD-10-CM | POA: Insufficient documentation

## 2015-03-02 DIAGNOSIS — Z8669 Personal history of other diseases of the nervous system and sense organs: Secondary | ICD-10-CM | POA: Insufficient documentation

## 2015-03-02 DIAGNOSIS — Z86711 Personal history of pulmonary embolism: Secondary | ICD-10-CM | POA: Diagnosis not present

## 2015-03-02 DIAGNOSIS — Z792 Long term (current) use of antibiotics: Secondary | ICD-10-CM | POA: Insufficient documentation

## 2015-03-02 DIAGNOSIS — K6289 Other specified diseases of anus and rectum: Secondary | ICD-10-CM | POA: Diagnosis present

## 2015-03-02 DIAGNOSIS — F419 Anxiety disorder, unspecified: Secondary | ICD-10-CM | POA: Diagnosis not present

## 2015-03-02 DIAGNOSIS — K5641 Fecal impaction: Secondary | ICD-10-CM | POA: Insufficient documentation

## 2015-03-02 MED ORDER — DOCUSATE SODIUM 100 MG PO CAPS: 100.0000 mg | ORAL_CAPSULE | Freq: Two times a day (BID) | ORAL | Status: DC
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
## 2015-03-02 NOTE — ED Notes (Signed)
Patient given sprite per patient's request and EDP's approval.

## 2015-03-02 NOTE — Discharge Instructions (Signed)
Fecal Impaction You should take a laxative such as Senokot or MiraLAX each day while you are on opiate pain medicine such as hydrocodone. Also take the stool softener Colace as prescribed. Follow up with your primary care physician as needed. A fecal impaction happens when there is a large, firm amount of stool (or feces) that cannot be passed. The impacted stool is usually in the rectum, which is the lowest part of the large bowel. The impacted stool can block the colon and cause significant problems. CAUSES  The longer stool stays in the rectum, the harder it gets. Anything that slows down your bowel movements can lead to fecal impaction, such as:  Constipation. This can be a long-standing (chronic) problem or can happen suddenly (acute).  Painful conditions of the rectum, such as hemorrhoids or anal fissures. The pain of these conditions can make you try to avoid having bowel movements.  Narcotic pain-relieving medicines, such as methadone, morphine, or codeine.  Not drinking enough fluids.  Inactivity and bed rest over long periods of time.  Diseases of the brain or nervous system that damage the nerves controlling the muscles of the intestines. SIGNS AND SYMPTOMS   Lack of normal bowel movements or changes in bowel patterns.  Sense of fullness in the rectum but unable to pass stool.  Pain or cramps in the abdominal area (often after meals).  Thin, watery discharge from the rectum. DIAGNOSIS  Your health care provider may suspect that you have a fecal impaction based on your symptoms and a physical exam. This will include an exam of your rectum. Sometimes X-rays or lab testing may be needed to confirm the diagnosis and to be sure there are no other problems.  TREATMENT   Initially an impaction can be removed manually. Using a gloved finger, your health care provider can remove hard stool from your rectum.  Medicine is sometimes needed. A suppository or enema can be given in the  rectum to soften the stool, which can stimulate a bowel movement. Medicines can also be given by mouth (orally).  Though rare, surgery may be needed if the colon has torn (perforated) due to blockage. HOME CARE INSTRUCTIONS   Develop regular bowel habits. This could include getting in the habit of having a bowel movement after your morning cup of coffee or after eating. Be sure to allow yourself enough time on the toilet.  Maintain a high-fiber diet.  Drink enough fluids to keep your urine clear or pale yellow as directed by your health care provider.  Exercise regularly.  If you begin to get constipated, increase the amount of fiber in your diet. Eat plenty of fruits, vegetables, whole wheat breads, bran, oatmeal, and similar products.  Take natural fiber laxatives or other laxatives only as directed by your health care provider. SEEK MEDICAL CARE IF:   You have ongoing rectal pain.  You require enemas or suppositories more than twice a week.  You have rectal bleeding.  You have continued problems, or you develop abdominal pain.  You have thin, pencil-like stools. SEEK IMMEDIATE MEDICAL CARE IF:  You have black or tarry stools. MAKE SURE YOU:   Understand these instructions.  Will watch your condition.  Will get help right away if you are not doing well or get worse.   This information is not intended to replace advice given to you by your health care provider. Make sure you discuss any questions you have with your health care provider.   Document Released: 12/14/2003  Document Revised: 01/11/2013 Document Reviewed: 09/27/2012 Elsevier Interactive Patient Education [employer].

## 2015-03-02 NOTE — ED Notes (Signed)
Patient reports constipation since Wednesday. Unknown when last normal bowel movement was, but that Wednesday was the last day any feces came out. Patient reports using laxatives, 2 enemas, and doing her own digital disimpaction with very minimal effect. Reports rectal pain and burning.

## 2015-03-06 ENCOUNTER — Other Ambulatory Visit: Payer: Self-pay | Admitting: Family Medicine

## 2015-03-06 ENCOUNTER — Other Ambulatory Visit: Payer: Self-pay | Admitting: Gastroenterology

## 2015-03-06 NOTE — Telephone Encounter (Signed)
Refill for Lyrica denied. Has been called in and verified by pharmacy on 02/06/2015 with #3 refills.   All other meds filled per protocol.

## 2015-03-07 ENCOUNTER — Ambulatory Visit: Payer: Medicare Other | Admitting: Nutrition

## 2015-03-08 ENCOUNTER — Other Ambulatory Visit: Payer: Self-pay | Admitting: Family Medicine

## 2015-03-08 NOTE — Telephone Encounter (Signed)
Refill appropriate and filled per protocol. 

## 2015-03-12 DIAGNOSIS — K449 Diaphragmatic hernia without obstruction or gangrene: Secondary | ICD-10-CM | POA: Insufficient documentation

## 2015-03-12 DIAGNOSIS — G4700 Insomnia, unspecified: Secondary | ICD-10-CM | POA: Insufficient documentation

## 2015-03-12 DIAGNOSIS — D573 Sickle-cell trait: Secondary | ICD-10-CM | POA: Insufficient documentation

## 2015-03-12 DIAGNOSIS — Z9109 Other allergy status, other than to drugs and biological substances: Secondary | ICD-10-CM | POA: Insufficient documentation

## 2015-03-12 DIAGNOSIS — Z780 Asymptomatic menopausal state: Secondary | ICD-10-CM | POA: Insufficient documentation

## 2015-03-12 DIAGNOSIS — I8393 Asymptomatic varicose veins of bilateral lower extremities: Secondary | ICD-10-CM | POA: Insufficient documentation

## 2015-03-12 DIAGNOSIS — L02211 Cutaneous abscess of abdominal wall: Secondary | ICD-10-CM | POA: Insufficient documentation

## 2015-03-12 DIAGNOSIS — E559 Vitamin D deficiency, unspecified: Secondary | ICD-10-CM | POA: Insufficient documentation

## 2015-03-12 DIAGNOSIS — F4321 Adjustment disorder with depressed mood: Secondary | ICD-10-CM | POA: Insufficient documentation

## 2015-03-12 DIAGNOSIS — M7662 Achilles tendinitis, left leg: Secondary | ICD-10-CM | POA: Insufficient documentation

## 2015-04-02 ENCOUNTER — Other Ambulatory Visit: Payer: Self-pay | Admitting: Family Medicine

## 2015-04-02 NOTE — Telephone Encounter (Signed)
Refill appropriate and filled per protocol. 

## 2015-04-03 ENCOUNTER — Ambulatory Visit: Payer: Medicaid Other | Admitting: Family Medicine

## 2015-04-04 ENCOUNTER — Other Ambulatory Visit: Payer: Self-pay | Admitting: *Deleted

## 2015-04-04 MED ORDER — POTASSIUM CHLORIDE CRYS ER 10 MEQ PO TBCR: EXTENDED_RELEASE_TABLET | ORAL | Status: DC

## 2015-04-04 NOTE — Telephone Encounter (Signed)
Received fax requesting refill on K+.  Refill appropriate and filled per protocol. 

## 2015-04-17 ENCOUNTER — Ambulatory Visit: Payer: Medicare Other | Admitting: Gastroenterology

## 2015-04-22 ENCOUNTER — Encounter: Payer: Self-pay | Admitting: Cardiovascular Disease

## 2015-04-22 ENCOUNTER — Ambulatory Visit (INDEPENDENT_AMBULATORY_CARE_PROVIDER_SITE_OTHER): Payer: Medicare Other | Admitting: Cardiovascular Disease

## 2015-04-22 VITALS — BP 104/70 | HR 78 | Ht 62.0 in | Wt 250.0 lb

## 2015-04-22 DIAGNOSIS — I82409 Acute embolism and thrombosis of unspecified deep veins of unspecified lower extremity: Secondary | ICD-10-CM | POA: Diagnosis not present

## 2015-04-22 DIAGNOSIS — I2699 Other pulmonary embolism without acute cor pulmonale: Secondary | ICD-10-CM

## 2015-04-22 DIAGNOSIS — I10 Essential (primary) hypertension: Secondary | ICD-10-CM

## 2015-04-22 DIAGNOSIS — I517 Cardiomegaly: Secondary | ICD-10-CM | POA: Diagnosis not present
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
## 2015-04-22 NOTE — Patient Instructions (Signed)
Continue all current medications. Follow up as needed  

## 2015-04-30 ENCOUNTER — Ambulatory Visit: Payer: Medicaid Other | Admitting: Family Medicine

## 2015-04-30 ENCOUNTER — Other Ambulatory Visit: Payer: Self-pay | Admitting: Family Medicine

## 2015-04-30 NOTE — Telephone Encounter (Signed)
Refill appropriate and filled per protocol. 

## 2015-05-02 ENCOUNTER — Other Ambulatory Visit: Payer: Self-pay | Admitting: Family Medicine

## 2015-05-02 NOTE — Telephone Encounter (Signed)
Refill appropriate and filled per protocol. 

## 2015-05-07 ENCOUNTER — Other Ambulatory Visit: Payer: Self-pay | Admitting: Family Medicine

## 2015-05-07 NOTE — Telephone Encounter (Signed)
Refill appropriate and filled per protocol. 

## 2015-05-09 ENCOUNTER — Ambulatory Visit (INDEPENDENT_AMBULATORY_CARE_PROVIDER_SITE_OTHER): Payer: Medicare Other | Admitting: Gastroenterology

## 2015-05-09 ENCOUNTER — Other Ambulatory Visit: Payer: Self-pay

## 2015-05-09 ENCOUNTER — Encounter: Payer: Self-pay | Admitting: Gastroenterology

## 2015-05-09 VITALS — BP 128/80 | HR 79 | Temp 97.3°F | Ht 62.0 in | Wt 245.8 lb

## 2015-05-09 DIAGNOSIS — D509 Iron deficiency anemia, unspecified: Secondary | ICD-10-CM | POA: Diagnosis not present

## 2015-05-09 DIAGNOSIS — K5900 Constipation, unspecified: Secondary | ICD-10-CM

## 2015-05-09 MED ORDER — LINACLOTIDE 145 MCG PO CAPS: 145.0000 ug | ORAL_CAPSULE | Freq: Every day | ORAL | Status: DC
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
## 2015-05-09 NOTE — Assessment & Plan Note (Signed)
64 year old female with mild anemia, low ferritin and low normal iron, with colonoscopy and EGD overall unrevealing. I feel to be thorough, we should pursue a capsule study to wrap up the GI evaluation. I doubt there will be an occult etiology, but this will complete the GI evaluation for IDA. She will then need to start iron BID, and recheck CBC, iron, and ferritin in 3 months. If no significant improvement, may need feraheme.   Return in 3 months for labs.

## 2015-05-09 NOTE — Patient Instructions (Signed)
Please start taking Linzess 1 capsule each morning, 30 minutes before breakfast.   Start taking iron twice a day. I sent this to the pharmacy.  We have scheduled you for a capsule study of your small intestine.   I will see you in 3 months.   Iron-Rich Diet Iron is a mineral that helps your body to produce hemoglobin. Hemoglobin is a protein in your red blood cells that carries oxygen to your body's tissues. Eating too little iron may cause you to feel weak and tired, and it can increase your risk for infection. Eating enough iron is necessary for your body's metabolism, muscle function, and nervous system. Iron is naturally found in many foods. It can also be added to foods or fortified in foods. There are two types of dietary iron:  Heme iron. Heme iron is absorbed by the body more easily than nonheme iron. Heme iron is found in meat, poultry, and fish.  Nonheme iron. Nonheme iron is found in dietary supplements, iron-fortified grains, beans, and vegetables. You may need to follow an iron-rich diet if:  You have been diagnosed with iron deficiency or iron-deficiency anemia.  You have a condition that prevents you from absorbing dietary iron, such as:  Infection in your intestines.  Celiac disease. This involves long-lasting (chronic) inflammation of your intestines.  You do not eat enough iron.  You eat a diet that is high in foods that impair iron absorption.  You have lost a lot of blood.  You have heavy bleeding during your menstrual cycle.  You are pregnant. WHAT IS MY PLAN? Your health care provider may help you to determine how much iron you need per day based on your condition. Generally, when a person consumes sufficient amounts of iron in the diet, the following iron needs are met:  Men.  37-61 years old: 11 mg per day.  40-44 years old: 8 mg per day.  Women.   73-32 years old: 15 mg per day.  59-54 years old: 18 mg per day.  Over 35 years old: 8 mg per  day.  Pregnant women: 27 mg per day.  Breastfeeding women: 9 mg per day. WHAT DO I NEED TO KNOW ABOUT AN IRON-RICH DIET?  Eat fresh fruits and vegetables that are high in vitamin C along with foods that are high in iron. This will help increase the amount of iron that your body absorbs from food, especially with foods containing nonheme iron. Foods that are high in vitamin C include oranges, peppers, tomatoes, and mango.  Take iron supplements only as directed by your health care provider. Overdose of iron can be life-threatening. If you were prescribed iron supplements, take them with orange juice or a vitamin C supplement.  Cook foods in pots and pans that are made from iron.   Eat nonheme iron-containing foods alongside foods that are high in heme iron. This helps to improve your iron absorption.   Certain foods and drinks contain compounds that impair iron absorption. Avoid eating these foods in the same meal as iron-rich foods or with iron supplements. These include:  Coffee, black tea, and red wine.  Milk, dairy products, and foods that are high in calcium.  Beans, soybeans, and peas.  Whole grains.  When eating foods that contain both nonheme iron and compounds that impair iron absorption, follow these tips to absorb iron better.   Soak beans overnight before cooking.  Soak whole grains overnight and drain them before using.  Ferment flours before  baking, such as using yeast in bread dough. WHAT FOODS CAN I EAT? Grains Iron-fortified breakfast cereal. Iron-fortified whole-wheat bread. Enriched rice. Sprouted grains. Vegetables Spinach. Potatoes with skin. Green peas. Broccoli. Red and green bell peppers. Fermented vegetables. Fruits Prunes. Raisins. Oranges. Strawberries. Mango. Grapefruit. Meats and Other Protein Sources Beef liver. Oysters. Beef. Shrimp. [place]. Chicken. [place]. Sardines. Chickpeas. Nuts. Tofu. Beverages Tomato juice. Fresh orange juice. Prune  juice. Hibiscus tea. Fortified instant breakfast shakes. Condiments Tahini. Fermented soy sauce. Sweets and Desserts Black-strap molasses.  Other Wheat germ. The items listed above may not be a complete list of recommended foods or beverages. Contact your dietitian for more options. WHAT FOODS ARE NOT RECOMMENDED? Grains Whole grains. Bran cereal. Bran flour. Oats. Vegetables Artichokes. Brussels sprouts. Kale. Fruits Blueberries. Raspberries. Strawberries. Figs. Meats and Other Protein Sources Soybeans. Products made from soy protein. Dairy Milk. Cream. Cheese. Yogurt. Cottage cheese. Beverages Coffee. Black tea. Red wine. Sweets and Desserts Cocoa. Chocolate. Ice cream. Other Basil. Oregano. Parsley. The items listed above may not be a complete list of foods and beverages to avoid. Contact your dietitian for more information.   This information is not intended to replace advice given to you by your health care provider. Make sure you discuss any questions you have with your health care provider.   Document Released: 11/04/2004 Document Revised: 04/13/2014 Document Reviewed: 10/18/2013 Elsevier Interactive Patient Education [employer].

## 2015-05-09 NOTE — Assessment & Plan Note (Signed)
Likely opioid induced. Start linzess 145 mcg once daily. Return in 3 months.

## 2015-05-10 NOTE — Progress Notes (Signed)
cc'ed to pcp °

## 2015-05-13 ENCOUNTER — Encounter: Payer: Self-pay | Admitting: Family Medicine

## 2015-05-13 ENCOUNTER — Ambulatory Visit (INDEPENDENT_AMBULATORY_CARE_PROVIDER_SITE_OTHER): Payer: Medicare Other | Admitting: Family Medicine

## 2015-05-13 VITALS — BP 128/68 | HR 72 | Temp 98.4°F | Resp 14 | Ht 62.0 in | Wt 242.0 lb

## 2015-05-13 DIAGNOSIS — E1149 Type 2 diabetes mellitus with other diabetic neurological complication: Secondary | ICD-10-CM

## 2015-05-13 DIAGNOSIS — L981 Factitial dermatitis: Secondary | ICD-10-CM | POA: Diagnosis not present

## 2015-05-13 DIAGNOSIS — N182 Chronic kidney disease, stage 2 (mild): Secondary | ICD-10-CM

## 2015-05-13 DIAGNOSIS — E1143 Type 2 diabetes mellitus with diabetic autonomic (poly)neuropathy: Secondary | ICD-10-CM

## 2015-05-13 DIAGNOSIS — I10 Essential (primary) hypertension: Secondary | ICD-10-CM | POA: Diagnosis not present

## 2015-05-13 DIAGNOSIS — E038 Other specified hypothyroidism: Secondary | ICD-10-CM | POA: Diagnosis not present

## 2015-05-13 LAB — COMPREHENSIVE METABOLIC PANEL
ALBUMIN: 3.7 g/dL (ref 3.6–5.1)
ALK PHOS: 64 U/L (ref 33–130)
ALT: 8 U/L (ref 6–29)
AST: 17 U/L (ref 10–35)
BUN: 15 mg/dL (ref 7–25)
CHLORIDE: 98 mmol/L (ref 98–110)
CO2: 31 mmol/L (ref 20–31)
CREATININE: 1.03 mg/dL — AB (ref 0.50–0.99)
Calcium: 9.9 mg/dL (ref 8.6–10.4)
Glucose, Bld: 93 mg/dL (ref 70–99)
POTASSIUM: 3.8 mmol/L (ref 3.5–5.3)
Sodium: 138 mmol/L (ref 135–146)
TOTAL PROTEIN: 6.9 g/dL (ref 6.1–8.1)
Total Bilirubin: 0.3 mg/dL (ref 0.2–1.2)

## 2015-05-13 LAB — TSH: TSH: 9.27 mIU/L — ABNORMAL HIGH

## 2015-05-13 LAB — LIPID PANEL
CHOL/HDL RATIO: 2.5 ratio (ref <=5.0)
CHOLESTEROL: 178 mg/dL (ref 125–200)
HDL: 71 mg/dL (ref >=46)
LDL Cholesterol: 90 mg/dL (ref <130)
TRIGLYCERIDES: 85 mg/dL (ref <150)
VLDL: 17 mg/dL (ref <30)

## 2015-05-13 LAB — T3, FREE: T3, Free: 2.5 pg/mL (ref 2.3–4.2)

## 2015-05-13 LAB — HEMOGLOBIN A1C
Hgb A1c MFr Bld: 6.4 % — ABNORMAL HIGH (ref <5.7)
Mean Plasma Glucose: 137 mg/dL — ABNORMAL HIGH (ref <117)

## 2015-05-13 LAB — T4, FREE: Free T4: 0.9 ng/dL (ref 0.8–1.8)

## 2015-05-13 MED ORDER — HYDROCODONE-ACETAMINOPHEN 10-325 MG PO TABS: 1.0000 | ORAL_TABLET | Freq: Four times a day (QID) | ORAL | Status: DC | PRN

## 2015-05-13 MED ORDER — PREGABALIN 100 MG PO CAPS
100.0000 mg | ORAL_CAPSULE | Freq: Three times a day (TID) | ORAL | Status: DC
Start: 2015-05-13 — End: 2015-09-04

## 2015-05-13 NOTE — Progress Notes (Signed)
Patient ID: Yesenia [name], female   DOB: 1950/10/16, 65 y.o.   MRN: [id number]   Subjective:    Patient ID: Yesenia [name], female    DOB: 11/19/1950, 65 y.o.   MRN: [id number]  Patient presents for 3 month F/U  she here for follow-up. She is no particular concerns today. She is taking her medications as prescribed. I did note that she has more excoriations on her arms and on her face she states that this is happening in the middle of the night sometimes when she gets stressed out that she will pick at her skin. She is still following with her psychiatrist. She states that her blood sugars have been good her weight is down 8 pounds intentionally.  She did request increase in Lyrica, pain clinic also suggested this, currently on 75mg   This does help her neuropathy and chronic pain  DM- last A1C 6.6%, on MTF  REVIEWED cardiology and GI note     Review Of Systems:  GEN- denies fatigue, fever, weight loss,weakness, recent illness HEENT- denies eye drainage, change in vision, nasal discharge, CVS- denies chest pain, palpitations RESP- denies SOB, cough, wheeze ABD- denies N/V, change in stools, abd pain GU- denies dysuria, hematuria, dribbling, incontinence MSK- denies joint pain, muscle aches, injury Neuro- denies headache, dizziness, syncope, seizure activity       Objective:    BP 128/68 mmHg  Pulse 72  Temp(Src) 98.4 F (36.9 C) (Oral)  Resp 14  Ht 5\' 2"  (1.575 m)  Wt 242 lb (109.77 kg)  BMI 44.25 kg/m2 GEN- NAD, alert and oriented x3 HEENT- PERRL, EOMI, non injected sclera, pink conjunctiva, MMM, oropharynx clear Neck- Supple, no thyromegaly CVS- RRR, no murmur RESP-CTAB ABD-NABS,soft,NT,ND Skin- exocriations and scabs on face, arms, bilat legs  EXT- No edema Pulses- Radial - 2+        Assessment & Plan:      Problem List Items Addressed This Visit    Type II diabetes mellitus with neurological manifestations (HCC)    Pt  has been well controlled  we'll recheck  Labs  goals A1c less than 7% she is on statin drug and ARB      Relevant Orders   Comprehensive metabolic panel (Completed)   Hemoglobin A1c (Completed)   Lipid panel (Completed)   Neurotic excoriations     His follow-up for psychiatry for her depression and anxiety. Im going to try hydroxyzine at bedtime as most of her excoriations occur during the night but she is high risk for superinfection.      Hypothyroidism   Relevant Orders   TSH (Completed)   T3, free (Completed)   T4, free (Completed)   Essential hypertension, benign     Blood pressure well controlled      Diabetic neuropathy (HCC)     Increase Lyrica 200 mg 3 times a day      CKD (chronic kidney disease), stage II - Primary   Relevant Orders   Comprehensive metabolic panel (Completed)      Note: This dictation was prepared with Dragon dictation along with smaller phrase technology. Any transcriptional errors that result from this process are unintentional.

## 2015-05-13 NOTE — Patient Instructions (Addendum)
F/U 4 months for physical Lyrica increased to 100mg   three times a day  Work on diet  Try vistaril at bedtime 50mg 

## 2015-05-14 ENCOUNTER — Encounter: Payer: Self-pay | Admitting: Family Medicine

## 2015-05-14 DIAGNOSIS — L981 Factitial dermatitis: Secondary | ICD-10-CM | POA: Insufficient documentation

## 2015-05-14 NOTE — Assessment & Plan Note (Signed)
Increase Lyrica 200 mg 3 times a day

## 2015-05-14 NOTE — Assessment & Plan Note (Addendum)
Pt  has been well controlled we'll recheck  Labs  goals A1c less than 7% she is on statin drug and ARB

## 2015-05-14 NOTE — Assessment & Plan Note (Signed)
His follow-up for psychiatry for her depression and anxiety. Im going to try hydroxyzine at bedtime as most of her excoriations occur during the night but she is high risk for superinfection.

## 2015-05-14 NOTE — Assessment & Plan Note (Signed)
Blood pressure well controlled

## 2015-05-16 ENCOUNTER — Other Ambulatory Visit: Payer: Self-pay | Admitting: *Deleted

## 2015-05-16 MED ORDER — LEVOTHYROXINE SODIUM 112 MCG PO TABS: 112.0000 ug | ORAL_TABLET | Freq: Every day | ORAL | Status: DC

## 2015-05-23 ENCOUNTER — Ambulatory Visit (HOSPITAL_COMMUNITY)
Admission: RE | Admit: 2015-05-23 | Discharge: 2015-05-23 | Disposition: A | Discharge status: Home / Self Care | Payer: Medicare Other | Source: Ambulatory Visit | Attending: Internal Medicine | Admitting: Internal Medicine

## 2015-05-23 ENCOUNTER — Encounter (HOSPITAL_COMMUNITY)
Admission: RE | Disposition: A | Discharge status: Home / Self Care | Payer: Self-pay | Source: Ambulatory Visit | Attending: Internal Medicine

## 2015-05-23 ENCOUNTER — Encounter (HOSPITAL_COMMUNITY): Payer: Self-pay | Admitting: *Deleted

## 2015-05-23 DIAGNOSIS — Z7984 Long term (current) use of oral hypoglycemic drugs: Secondary | ICD-10-CM | POA: Diagnosis not present

## 2015-05-23 DIAGNOSIS — Z79899 Other long term (current) drug therapy: Secondary | ICD-10-CM | POA: Insufficient documentation

## 2015-05-23 DIAGNOSIS — K259 Gastric ulcer, unspecified as acute or chronic, without hemorrhage or perforation: Secondary | ICD-10-CM | POA: Diagnosis not present

## 2015-05-23 DIAGNOSIS — E119 Type 2 diabetes mellitus without complications: Secondary | ICD-10-CM | POA: Insufficient documentation

## 2015-05-23 DIAGNOSIS — K56609 Unspecified intestinal obstruction, unspecified as to partial versus complete obstruction: Secondary | ICD-10-CM | POA: Insufficient documentation

## 2015-05-23 DIAGNOSIS — I110 Hypertensive heart disease with heart failure: Secondary | ICD-10-CM | POA: Insufficient documentation

## 2015-05-23 DIAGNOSIS — K639 Disease of intestine, unspecified: Secondary | ICD-10-CM | POA: Diagnosis not present

## 2015-05-23 DIAGNOSIS — Z86718 Personal history of other venous thrombosis and embolism: Secondary | ICD-10-CM | POA: Insufficient documentation

## 2015-05-23 DIAGNOSIS — D509 Iron deficiency anemia, unspecified: Secondary | ICD-10-CM

## 2015-05-23 HISTORY — PX: GIVENS CAPSULE STUDY: SHX5432

## 2015-05-23 LAB — GLUCOSE, CAPILLARY: GLUCOSE-CAPILLARY: 85 mg/dL (ref 65–99)

## 2015-05-23 SURGERY — IMAGING PROCEDURE, GI TRACT, INTRALUMINAL, VIA CAPSULE

## 2015-05-24 ENCOUNTER — Telehealth: Payer: Self-pay | Admitting: Gastroenterology

## 2015-05-24 ENCOUNTER — Encounter (HOSPITAL_COMMUNITY): Payer: Self-pay | Admitting: Internal Medicine

## 2015-05-24 DIAGNOSIS — D509 Iron deficiency anemia, unspecified: Secondary | ICD-10-CM | POA: Insufficient documentation

## 2015-05-24 DIAGNOSIS — K639 Disease of intestine, unspecified: Secondary | ICD-10-CM | POA: Insufficient documentation

## 2015-05-24 DIAGNOSIS — K56609 Unspecified intestinal obstruction, unspecified as to partial versus complete obstruction: Secondary | ICD-10-CM | POA: Insufficient documentation

## 2015-05-24 NOTE — Telephone Encounter (Signed)
Please let patient know that her small bowel capsule study showed:  Couple of gastric erosions and small bowel erosions, nonbleeding. Otherwise unremarkable study.   Would recommend update CBC, iron/TIBC, ferritin.  Keep office visit planned for May 2017.

## 2015-05-24 NOTE — Op Note (Signed)
      Small Bowel Givens Capsule Study Procedure date:  05/23/2015  Referring Provider:  Laban Emperor, NP PCP:  Dr. Vic Blackbird, MD  Indication for procedure:  Iron deficiency anemia. Work up in 01/2015 with colonoscopy which showed internal hemorrhoids, left-sided diverticula, 6 mm tubular adenoma, normal terminal ileum up to 15 cm. EGD showed mild erosive reflux esophagitis, polypoid gastric mucosa with reactive triopathy. No H pylori. Small bowel capsule endoscopy being done to complete GI workup of IDA.  Patient data:  Wt: 241 pounds Ht: 55 inches Waist: 30 inches  Findings:  Complete small bowel capsule endoscopy. Couple of gastric erosions (7 minutes 35 seconds, 7 minutes 58 seconds) 2 small bowel erosions (2 hour 6 minutes 39 seconds, 4 hours 9 minutes 33 seconds) but otherwise unremarkable study.  First Gastric image:  2 minutes First Duodenal image: 10 minutes 57 seconds First Ileo-Cecal Valve image: 6 hours 28 minutes 25 seconds First Cecal image: 6 hours 28 minutes 55 seconds Gastric Passage time: 8 minutes Small Bowel Passage time:  6 hours 17 minutes   Summary & Recommendations: Couple of gastric erosions and small bowel erosions, nonbleeding. Otherwise unremarkable study. Would recommend update CBC, iron/TIBC, ferritin. Keep office visit planned for May 2017.  Laureen Ochs. Bernarda Caffey [facility] [phone] 2/17/20171:38 PM    Attending note:  Pertinent images reviewed. Agree with above assessment and recommendations.

## 2015-05-28 ENCOUNTER — Other Ambulatory Visit: Payer: Self-pay | Admitting: Family Medicine

## 2015-05-28 NOTE — Telephone Encounter (Signed)
Medication refilled per protocol. 

## 2015-05-30 NOTE — Telephone Encounter (Signed)
Tried to call- NA 

## 2015-06-03 ENCOUNTER — Other Ambulatory Visit: Payer: Self-pay | Admitting: Family Medicine

## 2015-06-03 ENCOUNTER — Other Ambulatory Visit: Payer: Self-pay | Admitting: Nurse Practitioner

## 2015-06-03 NOTE — Telephone Encounter (Signed)
Medication refilled per protocol. 

## 2015-06-04 NOTE — Telephone Encounter (Signed)
Pt is aware. Lab order mailed to her per her request. Pt is already scheduled for May appt and she is aware of that appt.

## 2015-06-04 NOTE — Telephone Encounter (Signed)
Tried to call- NA 

## 2015-06-06 ENCOUNTER — Telehealth: Payer: Self-pay | Admitting: Family Medicine

## 2015-06-06 NOTE — Telephone Encounter (Signed)
Patient calling to get rx for her hydrocodone  [phone]

## 2015-06-11 MED ORDER — HYDROCODONE-ACETAMINOPHEN 10-325 MG PO TABS: 1.0000 | ORAL_TABLET | Freq: Four times a day (QID) | ORAL | Status: DC | PRN

## 2015-06-11 NOTE — Telephone Encounter (Signed)
Okay to refill? 

## 2015-06-11 NOTE — Telephone Encounter (Signed)
Prescription printed and patient made aware to come to office to pick up after 2pm on 06/11/2014.

## 2015-06-11 NOTE — Telephone Encounter (Signed)
Ok to refill??  Last office visit/ refill 05/12/2014.

## 2015-06-12 DIAGNOSIS — D509 Iron deficiency anemia, unspecified: Secondary | ICD-10-CM | POA: Diagnosis not present

## 2015-06-13 DIAGNOSIS — M47816 Spondylosis without myelopathy or radiculopathy, lumbar region: Secondary | ICD-10-CM | POA: Diagnosis not present

## 2015-06-13 DIAGNOSIS — M5136 Other intervertebral disc degeneration, lumbar region: Secondary | ICD-10-CM | POA: Diagnosis not present

## 2015-06-13 DIAGNOSIS — Z981 Arthrodesis status: Secondary | ICD-10-CM | POA: Diagnosis not present

## 2015-06-13 LAB — CBC WITH DIFFERENTIAL/PLATELET
BASOS ABS: 0 10*3/uL (ref 0.0–0.1)
BASOS PCT: 0 % (ref 0–1)
Eosinophils Absolute: 0.5 10*3/uL (ref 0.0–0.7)
Eosinophils Relative: 6 % — ABNORMAL HIGH (ref 0–5)
HEMATOCRIT: 34.6 % — AB (ref 36.0–46.0)
HEMOGLOBIN: 10.8 g/dL — AB (ref 12.0–15.0)
LYMPHS PCT: 31 % (ref 12–46)
Lymphs Abs: 2.3 10*3/uL (ref 0.7–4.0)
MCH: 24.5 pg — ABNORMAL LOW (ref 26.0–34.0)
MCHC: 31.2 g/dL (ref 30.0–36.0)
MCV: 78.5 fL (ref 78.0–100.0)
MONO ABS: 0.6 10*3/uL (ref 0.1–1.0)
MPV: 9.9 fL (ref 8.6–12.4)
Monocytes Relative: 8 % (ref 3–12)
NEUTROS ABS: 4.1 10*3/uL (ref 1.7–7.7)
NEUTROS PCT: 55 % (ref 43–77)
Platelets: 336 10*3/uL (ref 150–400)
RBC: 4.41 MIL/uL (ref 3.87–5.11)
RDW: 16.5 % — ABNORMAL HIGH (ref 11.5–15.5)
WBC: 7.5 10*3/uL (ref 4.0–10.5)

## 2015-06-13 LAB — IRON AND TIBC
%SAT: 5 % — ABNORMAL LOW (ref 11–50)
Iron: 22 ug/dL — ABNORMAL LOW (ref 45–160)
TIBC: 421 ug/dL (ref 250–450)
UIBC: 399 ug/dL (ref 125–400)

## 2015-06-13 LAB — FERRITIN: Ferritin: 9 ng/mL — ABNORMAL LOW (ref 20–288)

## 2015-06-14 ENCOUNTER — Other Ambulatory Visit: Payer: Self-pay | Admitting: Family Medicine

## 2015-06-14 NOTE — Telephone Encounter (Signed)
Medication refilled per protocol. 

## 2015-06-17 NOTE — Telephone Encounter (Signed)
Quick Note:  Slight decline in her Hgb, iron.  Is she taking iron supplement? If so, what is she taking and dosage. ______

## 2015-06-17 NOTE — Telephone Encounter (Signed)
Quick Note:  . ______ 

## 2015-06-20 ENCOUNTER — Telehealth: Payer: Self-pay | Admitting: Family Medicine

## 2015-06-20 NOTE — Telephone Encounter (Signed)
Pt was seen by Dr. Isabelle Course NP, In[facility] Catalina today at the [facility] in [place]. She was advised that she should increase her Hydrocodone from 2 pills per day to 6 pills per day. Pt does not feel like this increase is necessary.  Please advise. [id number]

## 2015-06-21 NOTE — Telephone Encounter (Signed)
Call placed to patient.   States that [place], NP with pain clinic, had suggested taking over pain prescription and having MD continue with routine medications.  Advised that when we refer to pain clinic, we defer to their specialty.

## 2015-07-08 ENCOUNTER — Ambulatory Visit (INDEPENDENT_AMBULATORY_CARE_PROVIDER_SITE_OTHER): Payer: Medicare Other | Admitting: Family Medicine

## 2015-07-08 ENCOUNTER — Encounter: Payer: Self-pay | Admitting: Family Medicine

## 2015-07-08 VITALS — BP 126/70 | HR 88 | Temp 98.1°F | Resp 16 | Ht 62.0 in | Wt 254.0 lb

## 2015-07-08 DIAGNOSIS — F3340 Major depressive disorder, recurrent, in remission, unspecified: Secondary | ICD-10-CM | POA: Diagnosis not present

## 2015-07-08 DIAGNOSIS — H8110 Benign paroxysmal vertigo, unspecified ear: Secondary | ICD-10-CM | POA: Diagnosis not present

## 2015-07-08 DIAGNOSIS — E559 Vitamin D deficiency, unspecified: Secondary | ICD-10-CM

## 2015-07-08 DIAGNOSIS — E1149 Type 2 diabetes mellitus with other diabetic neurological complication: Secondary | ICD-10-CM

## 2015-07-08 DIAGNOSIS — E1143 Type 2 diabetes mellitus with diabetic autonomic (poly)neuropathy: Secondary | ICD-10-CM | POA: Diagnosis not present

## 2015-07-08 DIAGNOSIS — E038 Other specified hypothyroidism: Secondary | ICD-10-CM | POA: Diagnosis not present

## 2015-07-08 MED ORDER — HYDROCODONE-ACETAMINOPHEN 10-325 MG PO TABS: 1.0000 | ORAL_TABLET | Freq: Four times a day (QID) | ORAL | Status: DC | PRN

## 2015-07-08 MED ORDER — MECLIZINE HCL 12.5 MG PO TABS: 12.5000 mg | ORAL_TABLET | Freq: Three times a day (TID) | ORAL | Status: DC | PRN

## 2015-07-08 NOTE — Patient Instructions (Addendum)
Try the asprecreme for your hands  Diabetic Shoes to Apothecary  Decrease cymbalta to 20mg  once a day for week, then stop  Continue Lyrica  Pain medication  F/U as previous

## 2015-07-09 NOTE — Progress Notes (Signed)
Patient ID: Yesenia [name], female   DOB: October 24, 1950, 65 y.o.   MRN: [id number]   Subjective:    Patient ID: Yesenia [name], female    DOB: 08-Jul-1950, 65 y.o.   MRN: [id number]  Patient presents for Diabetic Foot Exam Patient here for diabetic foot exam. She needs new diabetic shoes. She does have underlying diabetic neuropathy. Her diabetes is well controlled.  He is also concerned about a couple of vertigo spells that she has had the past few weeks. She states that she bends over to tie her shoes and gets up quickly or she moves suddenly at times things will start spinning. She does have some mild nausea associated with no headache she has not had any syncope.  He is being followed by psychiatry however she's been stable on her trazodone and BuSpar and they want to release her to my care.  She will like to have her vitamin D level checked again.      Review Of Systems:  GEN- denies fatigue, fever, weight loss,weakness, recent illness HEENT- denies eye drainage, change in vision, nasal discharge, CVS- denies chest pain, palpitations RESP- denies SOB, cough, wheeze ABD- denies N/V, change in stools, abd pain GU- denies dysuria, hematuria, dribbling, incontinence MSK- + joint pain, muscle aches, injury Neuro- denies headache, dizziness, syncope, seizure activity       Objective:    BP 126/70 mmHg  Pulse 88  Temp(Src) 98.1 F (36.7 C) (Oral)  Resp 16  Ht 5\' 2"  (1.575 m)  Wt 254 lb (115.214 kg)  BMI 46.45 kg/m2 GEN- NAD, alert and oriented x3 HEENT- PERRL, EOMI, non injected sclera, pink conjunctiva, MMM, oropharynx clear, TM Clear no effusion  Neck- Supple, no thyromegaly CVS- RRR, no murmur RESP-CTAB Neuro-CNIO-XII intact, no focal deficits  EXT- No edema Pulses- Radial, DP- 2+        Assessment & Plan:      Problem List Items Addressed This Visit    Type II diabetes mellitus with neurological manifestations ([place])    Diabetes is well-controlled she does  have neuropathy. Since she is on the Lyrica within a taper off of the Cymbalta she was decreased to 20 mg for week and then discontinue      Relevant Orders   HM DIABETES FOOT EXAM (Completed)   Microalbumin / creatinine urine ratio   MDD (major depressive disorder) ([place])    I will fill her psychiatric medications.      Hypothyroidism   Relevant Orders   TSH   T3, free   T4, free   Diabetic neuropathy ([place])    Other Visit Diagnoses    Vitamin D deficiency    -  Primary    Recheck Vitamin D    Relevant Orders    Vitamin D, 25-hydroxy    BPV (benign positional vertigo), unspecified laterality        Meclizine prn, no focal deficits        Note: This dictation was prepared with Dragon dictation along with smaller phrase technology. Any transcriptional errors that result from this process are unintentional.

## 2015-07-10 NOTE — Assessment & Plan Note (Addendum)
Diabetes is well-controlled she does have neuropathy. Since she is on the Lyrica within a taper off of the Cymbalta she was decreased to 20 mg for week and then discontinue

## 2015-07-10 NOTE — Assessment & Plan Note (Signed)
I will fill her psychiatric medications.

## 2015-07-11 ENCOUNTER — Other Ambulatory Visit: Payer: Self-pay | Admitting: Gastroenterology

## 2015-07-11 DIAGNOSIS — D509 Iron deficiency anemia, unspecified: Secondary | ICD-10-CM

## 2015-07-11 NOTE — Telephone Encounter (Signed)
Quick Note:  Needs repeat CBC, iron/tibc, ferritin in four months. ______

## 2015-07-22 ENCOUNTER — Other Ambulatory Visit: Payer: Self-pay | Admitting: Family Medicine

## 2015-07-22 NOTE — Telephone Encounter (Signed)
Refill appropriate and filled per protocol. 

## 2015-07-25 ENCOUNTER — Ambulatory Visit (INDEPENDENT_AMBULATORY_CARE_PROVIDER_SITE_OTHER): Payer: Medicare Other | Admitting: Physician Assistant

## 2015-07-25 ENCOUNTER — Encounter: Payer: Self-pay | Admitting: Physician Assistant

## 2015-07-25 VITALS — BP 116/84 | HR 76 | Temp 98.4°F | Resp 18 | Wt 268.0 lb

## 2015-07-25 DIAGNOSIS — E1149 Type 2 diabetes mellitus with other diabetic neurological complication: Secondary | ICD-10-CM | POA: Diagnosis not present

## 2015-07-25 DIAGNOSIS — R609 Edema, unspecified: Secondary | ICD-10-CM | POA: Diagnosis not present

## 2015-07-25 DIAGNOSIS — M47816 Spondylosis without myelopathy or radiculopathy, lumbar region: Secondary | ICD-10-CM | POA: Diagnosis not present

## 2015-07-25 DIAGNOSIS — M797 Fibromyalgia: Secondary | ICD-10-CM | POA: Diagnosis not present

## 2015-07-25 DIAGNOSIS — N182 Chronic kidney disease, stage 2 (mild): Secondary | ICD-10-CM | POA: Diagnosis not present

## 2015-07-25 DIAGNOSIS — R600 Localized edema: Secondary | ICD-10-CM | POA: Diagnosis not present

## 2015-07-25 DIAGNOSIS — M5136 Other intervertebral disc degeneration, lumbar region: Secondary | ICD-10-CM | POA: Diagnosis not present

## 2015-07-25 DIAGNOSIS — M4316 Spondylolisthesis, lumbar region: Secondary | ICD-10-CM | POA: Diagnosis not present

## 2015-07-25 LAB — COMPLETE METABOLIC PANEL WITH GFR
ALT: 6 U/L (ref 6–29)
AST: 16 U/L (ref 10–35)
Albumin: 3.3 g/dL — ABNORMAL LOW (ref 3.6–5.1)
Alkaline Phosphatase: 69 U/L (ref 33–130)
BUN: 13 mg/dL (ref 7–25)
CALCIUM: 9.1 mg/dL (ref 8.6–10.4)
CHLORIDE: 103 mmol/L (ref 98–110)
CO2: 28 mmol/L (ref 20–31)
Creat: 0.87 mg/dL (ref 0.50–0.99)
GFR, Est African American: 81 mL/min (ref >=60)
GFR, Est Non African American: 71 mL/min (ref >=60)
Glucose, Bld: 99 mg/dL (ref 70–99)
POTASSIUM: 4.2 mmol/L (ref 3.5–5.3)
SODIUM: 139 mmol/L (ref 135–146)
Total Bilirubin: 0.3 mg/dL (ref 0.2–1.2)
Total Protein: 5.9 g/dL — ABNORMAL LOW (ref 6.1–8.1)

## 2015-07-25 NOTE — Progress Notes (Signed)
Patient ID: Yesenia [name] MRN: [id number], DOB: 11/05/1950, 65 y.o. Date of Encounter: 07/25/2015, 3:13 PM    Chief Complaint:  Chief Complaint  Patient presents with  . c/o swelling    face ,hands ,feet   friend told her it looked like she was brreathiong hard, has had less urine output     HPI: 65 y.o. year oldwhite female presents with above.   She states that yesterday she went to get her nails done. Says that at that time she noted that her right hand was swollen compared to the left. Says that she said something about it and then the person doing her nails said that they noticed while she was sitting in the chair that she "looked like she was breathing hard". Patient says then when she got back to her house she looked at her legs and noted that her left lower leg was swollen. Also says that this pair of pants that she is wearing today are usually loose but today they are feeling tight. She also noted that her weight here today is higher than her weight was when checked here on the same set of scales at recent visit with Dr. Buelah Manis. Weight today----------- 268 Weight on 07/08/15 was 254  At end of visit reviewed that her Lasix is to be taken PRN. She states that yesterday after she noticed this swelling she took 2 of her Lasix. Prior to that-- she had used none for about one week.  She states that she has not been feeling short of breath. Has noticed no dyspnea on exertion. She sleeps on one pillow at night and has had no orthopnea. No chest pressure heaviness tightness squeezing. No PND.     Home Meds:   Outpatient Prescriptions Prior to Visit  Medication Sig Dispense Refill  . ACCU-CHEK SOFTCLIX LANCETS lancets USE TO TEST BLOOD SUGAR ONCE DAILY. 100 each 3  . busPIRone (BUSPAR) 10 MG tablet Take 1 tablet by mouth 3 (three) times daily.    [name] DEXILANT 60 MG capsule TAKE 1 CAPSULE BY MOUTH ONCE A DAY. 30 capsule 0  . furosemide (LASIX) 40 MG tablet TAKE 1 TABLET ONCE A  DAY AS NEEDED FOR FLUID. 30 tablet 3  . hydrochlorothiazide (HYDRODIURIL) 25 MG tablet TAKE (1) TABLET BY MOUTH ONCE DAILY. 30 tablet 6  . HYDROcodone-acetaminophen (NORCO) 10-325 MG tablet Take 1 tablet by mouth every 6 (six) hours as needed. 60 tablet 0  . levothyroxine (SYNTHROID, LEVOTHROID) 112 MCG tablet Take 1 tablet (112 mcg total) by mouth daily. 30 tablet 3  . Linaclotide (LINZESS) 145 MCG CAPS capsule Take 1 capsule (145 mcg total) by mouth daily. 30 minutes before breakfast 30 capsule 5  . meclizine (ANTIVERT) 12.5 MG tablet Take 1 tablet (12.5 mg total) by mouth 3 (three) times daily as needed for dizziness. 30 tablet 0  . metFORMIN (GLUCOPHAGE-XR) 500 MG 24 hr tablet TAKE (1) TABLET TWICE A DAY WITH MEALS. 60 tablet 0  . potassium chloride (K-DUR) 10 MEQ tablet TAKE (1) TABLET BY MOUTH ONCE DAILY. 30 tablet 6  . pregabalin (LYRICA) 100 MG capsule Take 1 capsule (100 mg total) by mouth 3 (three) times daily. 90 capsule 3  . ranitidine (ZANTAC) 150 MG tablet TAKE 1 TABLET BY MOUTH AT BEDTIME. 30 tablet 3  . simvastatin (ZOCOR) 40 MG tablet TAKE 1 TABLET BY MOUTH AT BEDTIME. 30 tablet 3  . traZODone (DESYREL) 100 MG tablet Take 100 mg by mouth at bedtime.     [name]  valsartan (DIOVAN) 80 MG tablet TAKE (1) TABLET BY MOUTH ONCE DAILY. 30 tablet 6  . DULoxetine (CYMBALTA) 20 MG capsule Take 1 capsule (20 mg total) by mouth 2 (two) times daily.  3  . hydrOXYzine (VISTARIL) 25 MG capsule TAKE (1) CAPSULE BY MOUTH THREE TIMES DAILY AS NEEDED. 30 capsule 1   No facility-administered medications prior to visit.    Allergies:  Allergies  Allergen Reactions  . Morphine Anaphylaxis and Other (See Comments)    Caused patient to CODE .  [name] Aspirin Other (See Comments)    G.I. Upset   . Nalbuphine Itching, Swelling and Rash      Review of Systems: See HPI for pertinent ROS. All other ROS negative.    Physical Exam: Blood pressure 116/84, pulse 76, temperature 98.4 F (36.9 C), temperature  source Oral, resp. rate 18, weight 268 lb (121.564 kg), SpO2 98 %., Body mass index is 49.01 kg/(m^2). General:  Obese WF. Appears in no acute distress. Neck: Supple. No thyromegaly. No lymphadenopathy. Lungs: Clear bilaterally to auscultation without wheezes, rales, or rhonchi. Breathing is unlabored. Heart: Regular rhythm. No murmurs, rubs, or gallops. Msk:  Strength and tone normal for age. Extremities/Skin: Warm and dry. 2+ pitting edema Bilateral lower extremities. Neuro: Alert and oriented X 3. Moves all extremities spontaneously. Gait is normal. CNII-XII grossly in tact. Psych:  Responds to questions appropriately with a normal affect.     ASSESSMENT AND PLAN:  65 y.o. year old female with  1. Bilateral edema of lower extremity Will check labs and f/u with her with these results.  Yesterday she took 2 of her Lasix. So far today she has not taken any Lasix yet. She is to go home and take 2 of her Lasix today and take 2 of her potassium today. Tomorrow she is to take one Lasix and 1 potassium. The following day which is Saturday she is to take one Lasix and 1 potassium. She is to follow-up with [place] if swelling not resolved after this.  - COMPLETE METABOLIC PANEL WITH GFR - Brain natriuretic peptide  2. CKD (chronic kidney disease), stage II - COMPLETE METABOLIC PANEL WITH GFR  3. Morbid obesity, unspecified obesity type ([place])  4. Type II diabetes mellitus with neurological manifestations [facility])   Signed, [address] [place], [place], [facility] 07/25/2015 3:13 PM

## 2015-07-26 LAB — BRAIN NATRIURETIC PEPTIDE: BRAIN NATRIURETIC PEPTIDE: 50.7 pg/mL (ref <100)

## 2015-07-30 ENCOUNTER — Telehealth: Payer: Self-pay | Admitting: Family Medicine

## 2015-07-30 DIAGNOSIS — M797 Fibromyalgia: Secondary | ICD-10-CM

## 2015-07-30 NOTE — Telephone Encounter (Signed)
PATIENT'S PAIN CLINIC IS CLOSING, AND WOULD LIKE REFERRAL TO [facility] IF POSSIBLE  [phone]

## 2015-08-02 ENCOUNTER — Telehealth: Payer: Self-pay | Admitting: Family Medicine

## 2015-08-02 ENCOUNTER — Other Ambulatory Visit: Payer: Self-pay | Admitting: Family Medicine

## 2015-08-02 NOTE — Telephone Encounter (Signed)
Needs to pick up new Rx for her pain medicine next Thursday.

## 2015-08-02 NOTE — Telephone Encounter (Signed)
Order faxed to [facility] in [place].

## 2015-08-02 NOTE — Telephone Encounter (Signed)
Needs to send Rx for Diabetic shoes to [facility] in [place].  Said she discussed this with you at visit a few weeks ago.  Says pharmacy still does not have order.

## 2015-08-02 NOTE — Telephone Encounter (Signed)
Please send order for diabetic shoes

## 2015-08-02 NOTE — Telephone Encounter (Signed)
Okay to refill? 

## 2015-08-06 ENCOUNTER — Telehealth: Payer: Self-pay | Admitting: Family Medicine

## 2015-08-06 MED ORDER — HYDROCODONE-ACETAMINOPHEN 10-325 MG PO TABS: 1.0000 | ORAL_TABLET | Freq: Four times a day (QID) | ORAL | Status: DC | PRN

## 2015-08-06 NOTE — Telephone Encounter (Signed)
Okay to refill? 

## 2015-08-06 NOTE — Telephone Encounter (Signed)
Ok to refill??  Last office visit 07/25/2015.  Last refill 07/08/2015.

## 2015-08-06 NOTE — Telephone Encounter (Signed)
Prescription printed and patient made aware to come to office to pick up on 08/26/2015.

## 2015-08-06 NOTE — Telephone Encounter (Signed)
Patient is calling to get rx for her hydrocodone  [phone]

## 2015-08-07 MED ORDER — HYDROCODONE-ACETAMINOPHEN 10-325 MG PO TABS: 1.0000 | ORAL_TABLET | Freq: Four times a day (QID) | ORAL | Status: DC | PRN

## 2015-08-07 NOTE — Telephone Encounter (Signed)
rx printed for pick up Thursday.

## 2015-08-12 NOTE — Telephone Encounter (Signed)
Ok to place referral.

## 2015-08-13 NOTE — Telephone Encounter (Signed)
Okay, send over notes from Dr. Lyla Son if we have them

## 2015-08-13 NOTE — Telephone Encounter (Signed)
Referral orders placed

## 2015-08-29 ENCOUNTER — Other Ambulatory Visit: Payer: Self-pay | Admitting: Family Medicine

## 2015-08-29 NOTE — Telephone Encounter (Signed)
Refill appropriate and filled per protocol. 

## 2015-09-03 ENCOUNTER — Telehealth: Payer: Self-pay | Admitting: *Deleted

## 2015-09-03 ENCOUNTER — Telehealth: Payer: Self-pay | Admitting: Family Medicine

## 2015-09-03 NOTE — Telephone Encounter (Signed)
Received fax requesting refill on Lyrica, Mclizine and Atarax.   Ok to refill??  Last office visit 07/25/2015.  Last refill on Lyrica 05/13/2015, #3 refills.

## 2015-09-03 NOTE — Telephone Encounter (Signed)
Ok to refill??  Last office visit 07/25/2015.  Last refill 08/07/2015.

## 2015-09-03 NOTE — Telephone Encounter (Signed)
Okay to refill? 

## 2015-09-03 NOTE — Telephone Encounter (Signed)
Patient requesting rx for her hydrocodone  [phone]

## 2015-09-04 ENCOUNTER — Ambulatory Visit (INDEPENDENT_AMBULATORY_CARE_PROVIDER_SITE_OTHER): Payer: Medicare Other | Admitting: Gastroenterology

## 2015-09-04 ENCOUNTER — Encounter: Payer: Self-pay | Admitting: Gastroenterology

## 2015-09-04 VITALS — BP 132/80 | HR 79 | Temp 97.2°F | Ht 62.0 in | Wt 264.2 lb

## 2015-09-04 DIAGNOSIS — K5900 Constipation, unspecified: Secondary | ICD-10-CM

## 2015-09-04 DIAGNOSIS — K760 Fatty (change of) liver, not elsewhere classified: Secondary | ICD-10-CM | POA: Diagnosis not present

## 2015-09-04 DIAGNOSIS — D509 Iron deficiency anemia, unspecified: Secondary | ICD-10-CM | POA: Diagnosis not present

## 2015-09-04 MED ORDER — HYDROCODONE-ACETAMINOPHEN 10-325 MG PO TABS: 1.0000 | ORAL_TABLET | Freq: Four times a day (QID) | ORAL | Status: DC | PRN

## 2015-09-04 MED ORDER — PREGABALIN 100 MG PO CAPS: 100.0000 mg | ORAL_CAPSULE | Freq: Three times a day (TID) | ORAL | Status: DC

## 2015-09-04 MED ORDER — HYDROXYZINE PAMOATE 25 MG PO CAPS: ORAL_CAPSULE | ORAL | Status: DC

## 2015-09-04 MED ORDER — MECLIZINE HCL 12.5 MG PO TABS: ORAL_TABLET | ORAL | Status: DC

## 2015-09-04 NOTE — Patient Instructions (Signed)
Stop the Linzess 145. Start Linzess 72 mcg once each morning, 30 minutes before breakfast. I provided samples.  I would like to give you an iron infusion to "juice" you up. I will be talking with hematology about this, and we will arrange it for you.  I will see you in 6 months!

## 2015-09-04 NOTE — Progress Notes (Signed)
CC'ED TO PCP 

## 2015-09-04 NOTE — Telephone Encounter (Signed)
Medication called to pharmacy. 

## 2015-09-04 NOTE — Assessment & Plan Note (Signed)
Linzess 145 mcg overshooting the mark. Decrease to new dosage of 72 mcg. Samples provided. Patient to call if she would like a prescription. Return in 6 months.

## 2015-09-04 NOTE — Assessment & Plan Note (Signed)
With thorough work-up from a GI perspective. Last ferritin 9, iron 22, Hgb 10.8. I feel she would do best with an iron infusion. Will arrange Injactafer infusion in the near future. May need referral to Hematology thereafter to follow chronically.

## 2015-09-04 NOTE — Assessment & Plan Note (Signed)
Recent LFTs normal. Discussed diet/exercise. Applauded on cessation of soft drinks. May want to consider US abdomen in future; however, will follow LFTs for now. 6 month return.
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
## 2015-09-04 NOTE — Telephone Encounter (Signed)
Prescription printed and patient made aware to come to office to pick up after 2pm on 09/04/2015.

## 2015-09-10 ENCOUNTER — Encounter: Payer: Medicaid Other | Admitting: Family Medicine

## 2015-09-11 ENCOUNTER — Other Ambulatory Visit: Payer: Self-pay | Admitting: Gastroenterology

## 2015-09-11 DIAGNOSIS — D649 Anemia, unspecified: Secondary | ICD-10-CM

## 2015-09-11 NOTE — Progress Notes (Signed)
Pt is aware. Orders done and faxed to short stay. Lab orders on file.

## 2015-09-11 NOTE — Progress Notes (Signed)
Discussed with hematology.   1. Please arrange iron infusion for patient: needs 1 dose of Injectafer 750 mg.  2. 6 weeks after dose, we need to recheck CBC, iron, ferritin, TIBC.

## 2015-09-17 ENCOUNTER — Encounter (HOSPITAL_COMMUNITY)
Admission: RE | Admit: 2015-09-17 | Discharge: 2015-09-17 | Disposition: A | Discharge status: Home / Self Care | Payer: Medicare Other | Source: Ambulatory Visit | Attending: Gastroenterology | Admitting: Gastroenterology

## 2015-09-17 ENCOUNTER — Encounter (HOSPITAL_COMMUNITY): Payer: Self-pay

## 2015-09-17 DIAGNOSIS — D509 Iron deficiency anemia, unspecified: Secondary | ICD-10-CM | POA: Diagnosis not present

## 2015-09-17 MED ORDER — SODIUM CHLORIDE 0.9 % IV SOLN
Freq: Once | INTRAVENOUS | Status: AC
  Administered 2015-09-17: 10:00:00 via INTRAVENOUS

## 2015-09-17 MED ORDER — SODIUM CHLORIDE 0.9 % IV SOLN
750.0000 mg | Freq: Once | INTRAVENOUS | Status: AC
  Administered 2015-09-17: 750 mg via INTRAVENOUS
  Filled 2015-09-17: qty 15

## 2015-09-24 ENCOUNTER — Other Ambulatory Visit: Payer: Self-pay | Admitting: Family Medicine

## 2015-09-24 NOTE — Telephone Encounter (Signed)
Refill appropriate and filled per protocol. 

## 2015-09-25 ENCOUNTER — Other Ambulatory Visit: Payer: Self-pay | Admitting: Family Medicine

## 2015-09-25 ENCOUNTER — Other Ambulatory Visit: Payer: Self-pay | Admitting: Gastroenterology

## 2015-09-26 ENCOUNTER — Telehealth: Payer: Self-pay | Admitting: Gastroenterology

## 2015-09-26 MED ORDER — LINACLOTIDE 72 MCG PO CAPS: 72.0000 ug | ORAL_CAPSULE | Freq: Every day | ORAL | Status: DC

## 2015-09-26 NOTE — Telephone Encounter (Signed)
RX done. 

## 2015-09-26 NOTE — Telephone Encounter (Signed)
I spoke with the pt and she wants the 32mcg.

## 2015-09-26 NOTE — Telephone Encounter (Signed)
Refill request came over for Linzess 168mcg. Vicente Males switched her to 29mcg at last OV. Please verify which dose patient prefers.

## 2015-10-01 ENCOUNTER — Telehealth: Payer: Self-pay | Admitting: Family Medicine

## 2015-10-01 MED ORDER — HYDROCODONE-ACETAMINOPHEN 10-325 MG PO TABS: 1.0000 | ORAL_TABLET | Freq: Four times a day (QID) | ORAL | Status: DC | PRN

## 2015-10-01 NOTE — Telephone Encounter (Signed)
Okay to refill? 

## 2015-10-01 NOTE — Telephone Encounter (Signed)
Prescription printed and patient made aware to come to office to pick up on 10/02/2015.

## 2015-10-01 NOTE — Telephone Encounter (Signed)
Ok to refill??  Last office visit 08/04/2015.  Last refill 09/04/2015.

## 2015-10-01 NOTE — Telephone Encounter (Signed)
Patient calling requesting a refill on her HYDROcodone-acetaminophen ([place]) 10-325 MG    CB# [phone]

## 2015-10-14 ENCOUNTER — Other Ambulatory Visit: Payer: Self-pay

## 2015-10-14 DIAGNOSIS — D509 Iron deficiency anemia, unspecified: Secondary | ICD-10-CM

## 2015-10-18 ENCOUNTER — Encounter: Payer: Medicare Other | Admitting: Family Medicine

## 2015-10-28 ENCOUNTER — Telehealth: Payer: Self-pay | Admitting: Internal Medicine

## 2015-10-28 NOTE — Telephone Encounter (Signed)
I have put them in the mail to her.

## 2015-10-28 NOTE — Telephone Encounter (Signed)
[phone]  Patient called and stated that she can not find the papers she needs to take to the lab. Can more be sent to her?  Please advise

## 2015-10-30 ENCOUNTER — Other Ambulatory Visit: Payer: Self-pay | Admitting: *Deleted

## 2015-10-30 ENCOUNTER — Other Ambulatory Visit: Payer: Self-pay | Admitting: Family Medicine

## 2015-10-30 MED ORDER — DEXLANSOPRAZOLE 60 MG PO CPDR: 1.0000 | DELAYED_RELEASE_CAPSULE | Freq: Every day | ORAL | 3 refills | Status: DC

## 2015-10-30 NOTE — Telephone Encounter (Signed)
Received fax requesting refill on Dexilant.   Refill appropriate and filled per protocol. 

## 2015-10-30 NOTE — Telephone Encounter (Signed)
Refill appropriate and filled per protocol. 

## 2015-10-31 ENCOUNTER — Telehealth: Payer: Self-pay | Admitting: Family Medicine

## 2015-10-31 NOTE — Telephone Encounter (Signed)
CB# [phone] Patient requesting a refill on her  HYDROcodone-acetaminophen (NORCO) 10-325 MG tablet

## 2015-10-31 NOTE — Telephone Encounter (Signed)
Ok to refill??  Last office visit 07/25/2015.  Last refill 10/01/2015.

## 2015-11-01 MED ORDER — HYDROCODONE-ACETAMINOPHEN 10-325 MG PO TABS: 1.0000 | ORAL_TABLET | Freq: Four times a day (QID) | ORAL | 0 refills | Status: DC | PRN

## 2015-11-01 MED ORDER — HYDROCHLOROTHIAZIDE 25 MG PO TABS: ORAL_TABLET | ORAL | 3 refills | Status: DC

## 2015-11-01 MED ORDER — METFORMIN HCL ER 500 MG PO TB24: ORAL_TABLET | ORAL | 3 refills | Status: DC

## 2015-11-01 NOTE — Telephone Encounter (Signed)
Prescription printed and patient made aware to come to office to pick up after 2pm on  11/01/2015.  Received fax requesting refill on MTD and HCTZ.   Refill appropriate and filled per protocol.

## 2015-11-01 NOTE — Telephone Encounter (Signed)
okay

## 2015-11-21 ENCOUNTER — Other Ambulatory Visit: Payer: Self-pay | Admitting: Family Medicine

## 2015-11-21 MED ORDER — BLOOD GLUCOSE MONITOR KIT: PACK | 0 refills | Status: DC

## 2015-11-21 MED ORDER — GLUCOSE BLOOD VI STRP: 1.0000 | ORAL_STRIP | Freq: Every day | 2 refills | Status: DC

## 2015-11-21 MED ORDER — LANCETS MISC. MISC: 1.0000 | Freq: Every day | 2 refills | Status: DC

## 2015-11-21 NOTE — Telephone Encounter (Signed)
Pt requested new order for diabetic supplies to [facility].  Orders sent

## 2015-11-28 ENCOUNTER — Telehealth: Payer: Self-pay | Admitting: *Deleted

## 2015-11-28 ENCOUNTER — Other Ambulatory Visit: Payer: Self-pay | Admitting: Family Medicine

## 2015-11-28 NOTE — Telephone Encounter (Signed)
Records indicate prescription refill appropriate for Norco.  Ok to refill??  Last office visit 07/25/2015.  Last refill 11/01/2015.

## 2015-11-28 NOTE — Telephone Encounter (Signed)
Refill appropriate and filled per protocol. 

## 2015-11-29 MED ORDER — HYDROCODONE-ACETAMINOPHEN 10-325 MG PO TABS: 1.0000 | ORAL_TABLET | Freq: Four times a day (QID) | ORAL | 0 refills | Status: DC | PRN

## 2015-11-29 NOTE — Telephone Encounter (Signed)
okay

## 2015-11-29 NOTE — Telephone Encounter (Signed)
Prescription printed.   Call placed to patient. No answer. No VM. 

## 2015-12-23 ENCOUNTER — Other Ambulatory Visit: Payer: Self-pay | Admitting: Family Medicine

## 2015-12-26 ENCOUNTER — Other Ambulatory Visit: Payer: Self-pay | Admitting: *Deleted

## 2015-12-26 MED ORDER — PREGABALIN 100 MG PO CAPS: 100.0000 mg | ORAL_CAPSULE | Freq: Three times a day (TID) | ORAL | 3 refills | Status: DC

## 2016-01-01 ENCOUNTER — Telehealth: Payer: Self-pay | Admitting: Family Medicine

## 2016-01-01 MED ORDER — HYDROCODONE-ACETAMINOPHEN 10-325 MG PO TABS: 1.0000 | ORAL_TABLET | Freq: Four times a day (QID) | ORAL | 0 refills | Status: DC | PRN

## 2016-01-01 NOTE — Telephone Encounter (Signed)
Ok to refill??  Last office visit 07/25/2015.  Last refill 11/29/2015.

## 2016-01-01 NOTE — Telephone Encounter (Signed)
Patient calling to get rx for her hydrocodone  [phone]

## 2016-01-01 NOTE — Telephone Encounter (Signed)
okay

## 2016-01-01 NOTE — Telephone Encounter (Signed)
Prescription printed and patient made aware to come to office to pick up on 01/02/2016.

## 2016-01-07 ENCOUNTER — Other Ambulatory Visit: Payer: Self-pay | Admitting: Family Medicine

## 2016-01-09 ENCOUNTER — Emergency Department (HOSPITAL_COMMUNITY): Payer: Medicare Other

## 2016-01-09 ENCOUNTER — Emergency Department (HOSPITAL_COMMUNITY)
Admission: EM | Admit: 2016-01-09 | Discharge: 2016-01-09 | Disposition: A | Discharge status: Home / Self Care | Payer: Medicare Other | Attending: Emergency Medicine | Admitting: Emergency Medicine

## 2016-01-09 ENCOUNTER — Encounter (HOSPITAL_COMMUNITY): Payer: Self-pay

## 2016-01-09 DIAGNOSIS — E119 Type 2 diabetes mellitus without complications: Secondary | ICD-10-CM | POA: Insufficient documentation

## 2016-01-09 DIAGNOSIS — Y92009 Unspecified place in unspecified non-institutional (private) residence as the place of occurrence of the external cause: Secondary | ICD-10-CM | POA: Insufficient documentation

## 2016-01-09 DIAGNOSIS — S4992XA Unspecified injury of left shoulder and upper arm, initial encounter: Secondary | ICD-10-CM | POA: Diagnosis present

## 2016-01-09 DIAGNOSIS — S42202A Unspecified fracture of upper end of left humerus, initial encounter for closed fracture: Secondary | ICD-10-CM | POA: Diagnosis not present

## 2016-01-09 DIAGNOSIS — S42292A Other displaced fracture of upper end of left humerus, initial encounter for closed fracture: Secondary | ICD-10-CM | POA: Diagnosis not present

## 2016-01-09 DIAGNOSIS — M25532 Pain in left wrist: Secondary | ICD-10-CM | POA: Diagnosis not present

## 2016-01-09 DIAGNOSIS — S43015A Anterior dislocation of left humerus, initial encounter: Secondary | ICD-10-CM | POA: Insufficient documentation

## 2016-01-09 DIAGNOSIS — Y9389 Activity, other specified: Secondary | ICD-10-CM | POA: Insufficient documentation

## 2016-01-09 DIAGNOSIS — Y999 Unspecified external cause status: Secondary | ICD-10-CM | POA: Diagnosis not present

## 2016-01-09 DIAGNOSIS — W19XXXA Unspecified fall, initial encounter: Secondary | ICD-10-CM

## 2016-01-09 DIAGNOSIS — W1839XA Other fall on same level, initial encounter: Secondary | ICD-10-CM | POA: Insufficient documentation

## 2016-01-09 DIAGNOSIS — Z79899 Other long term (current) drug therapy: Secondary | ICD-10-CM | POA: Diagnosis not present

## 2016-01-09 DIAGNOSIS — Z7984 Long term (current) use of oral hypoglycemic drugs: Secondary | ICD-10-CM | POA: Insufficient documentation

## 2016-01-09 DIAGNOSIS — M25531 Pain in right wrist: Secondary | ICD-10-CM | POA: Diagnosis not present

## 2016-01-09 DIAGNOSIS — E039 Hypothyroidism, unspecified: Secondary | ICD-10-CM | POA: Diagnosis not present

## 2016-01-09 DIAGNOSIS — N182 Chronic kidney disease, stage 2 (mild): Secondary | ICD-10-CM | POA: Diagnosis not present

## 2016-01-09 DIAGNOSIS — S42291A Other displaced fracture of upper end of right humerus, initial encounter for closed fracture: Secondary | ICD-10-CM | POA: Insufficient documentation

## 2016-01-09 DIAGNOSIS — M25512 Pain in left shoulder: Secondary | ICD-10-CM | POA: Diagnosis not present

## 2016-01-09 DIAGNOSIS — I129 Hypertensive chronic kidney disease with stage 1 through stage 4 chronic kidney disease, or unspecified chronic kidney disease: Secondary | ICD-10-CM | POA: Insufficient documentation

## 2016-01-09 DIAGNOSIS — M25521 Pain in right elbow: Secondary | ICD-10-CM | POA: Diagnosis not present

## 2016-01-09 DIAGNOSIS — S42351A Displaced comminuted fracture of shaft of humerus, right arm, initial encounter for closed fracture: Secondary | ICD-10-CM | POA: Diagnosis not present

## 2016-01-09 DIAGNOSIS — S42201A Unspecified fracture of upper end of right humerus, initial encounter for closed fracture: Secondary | ICD-10-CM | POA: Diagnosis not present

## 2016-01-09 DIAGNOSIS — M25522 Pain in left elbow: Secondary | ICD-10-CM | POA: Diagnosis not present

## 2016-01-09 DIAGNOSIS — S4292XA Fracture of left shoulder girdle, part unspecified, initial encounter for closed fracture: Secondary | ICD-10-CM

## 2016-01-09 DIAGNOSIS — M25511 Pain in right shoulder: Secondary | ICD-10-CM | POA: Diagnosis not present

## 2016-01-09 MED ORDER — PROPOFOL 10 MG/ML IV BOLUS
0.5000 mg/kg | Freq: Once | INTRAVENOUS | Status: DC
  Filled 2016-01-09: qty 20

## 2016-01-09 MED ORDER — DIAZEPAM 5 MG PO TABS
5.0000 mg | ORAL_TABLET | Freq: Once | ORAL | Status: AC
  Administered 2016-01-09: 5 mg via ORAL
  Filled 2016-01-09: qty 1

## 2016-01-09 MED ORDER — KETOROLAC TROMETHAMINE 60 MG/2ML IM SOLN
30.0000 mg | Freq: Once | INTRAMUSCULAR | Status: AC
  Administered 2016-01-09: 30 mg via INTRAMUSCULAR
  Filled 2016-01-09: qty 2

## 2016-01-09 MED ORDER — PROMETHAZINE HCL 25 MG/ML IJ SOLN
12.5000 mg | Freq: Once | INTRAMUSCULAR | Status: AC
  Administered 2016-01-09: 12.5 mg via INTRAMUSCULAR
  Filled 2016-01-09: qty 1

## 2016-01-09 MED ORDER — HYDROMORPHONE HCL 1 MG/ML IJ SOLN
1.0000 mg | Freq: Once | INTRAMUSCULAR | Status: DC
  Filled 2016-01-09: qty 1

## 2016-01-09 MED ORDER — PROPOFOL 10 MG/ML IV BOLUS
INTRAVENOUS | Status: AC | PRN
  Administered 2016-01-09: 50 mg via INTRAVENOUS

## 2016-01-09 MED ORDER — HYDROMORPHONE HCL 1 MG/ML IJ SOLN
1.0000 mg | Freq: Once | INTRAMUSCULAR | Status: AC
  Administered 2016-01-09: 1 mg via INTRAMUSCULAR

## 2016-01-09 MED ORDER — KETAMINE HCL 10 MG/ML IJ SOLN
INTRAMUSCULAR | Status: AC | PRN
  Administered 2016-01-09: 50 mg via INTRAVENOUS

## 2016-01-09 MED ORDER — ONDANSETRON HCL 4 MG/2ML IJ SOLN
4.0000 mg | Freq: Once | INTRAMUSCULAR | Status: DC
  Filled 2016-01-09: qty 2

## 2016-01-09 MED ORDER — HYDROMORPHONE HCL 1 MG/ML IJ SOLN
1.0000 mg | Freq: Once | INTRAMUSCULAR | Status: AC
  Administered 2016-01-09: 1 mg via INTRAMUSCULAR
  Filled 2016-01-09: qty 1

## 2016-01-09 MED ORDER — OXYCODONE-ACETAMINOPHEN 5-325 MG PO TABS
1.0000 | ORAL_TABLET | Freq: Once | ORAL | Status: AC
  Administered 2016-01-09: 1 via ORAL
  Filled 2016-01-09: qty 1

## 2016-01-09 MED ORDER — OXYCODONE-ACETAMINOPHEN 5-325 MG PO TABS: 1.0000 | ORAL_TABLET | ORAL | 0 refills | Status: DC | PRN

## 2016-01-09 MED ORDER — KETAMINE HCL 10 MG/ML IJ SOLN
1.0000 mg/kg | Freq: Once | INTRAMUSCULAR | Status: DC
Start: 2016-01-09 — End: 2016-01-09
  Filled 2016-01-09: qty 1

## 2016-01-09 NOTE — ED Triage Notes (Signed)
Pt says she lost her balance and fell at home this morning approx 40 min ago. C/O pain in upper chest and across her shoulder blades in her back.  Also c/o pain in both arms and bilateral hip pain.
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
## 2016-01-09 NOTE — ED Notes (Signed)
Informed consent not signed patient received 2 mg of Hydromorphone prior to consent. No family present to sign consent.

## 2016-01-09 NOTE — ED Notes (Signed)
Pt states she fell and as she was going down she tried to grab a door knob. Pt states she hurts from collar bone to collar bone, bilateral arms, left knee, and her hips are aching. Pt states she cannot move her arms and cannot move her elbows. Pt denies any LOC.

## 2016-01-10 ENCOUNTER — Ambulatory Visit (INDEPENDENT_AMBULATORY_CARE_PROVIDER_SITE_OTHER): Payer: Medicare Other | Admitting: Orthopedic Surgery

## 2016-01-10 ENCOUNTER — Encounter: Payer: Self-pay | Admitting: Orthopedic Surgery

## 2016-01-10 VITALS — Resp 16 | Ht 62.0 in | Wt 264.0 lb

## 2016-01-10 DIAGNOSIS — S4292XA Fracture of left shoulder girdle, part unspecified, initial encounter for closed fracture: Secondary | ICD-10-CM

## 2016-01-10 DIAGNOSIS — S42291A Other displaced fracture of upper end of right humerus, initial encounter for closed fracture: Secondary | ICD-10-CM | POA: Diagnosis not present
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
## 2016-01-11 ENCOUNTER — Emergency Department (HOSPITAL_COMMUNITY): Payer: Medicare Other

## 2016-01-11 ENCOUNTER — Emergency Department (HOSPITAL_COMMUNITY)
Admission: EM | Admit: 2016-01-11 | Discharge: 2016-01-11 | Disposition: A | Discharge status: Home / Self Care | Payer: Medicare Other | Source: Home / Self Care | Attending: Emergency Medicine | Admitting: Emergency Medicine

## 2016-01-11 ENCOUNTER — Encounter (HOSPITAL_COMMUNITY): Payer: Self-pay | Admitting: Emergency Medicine

## 2016-01-11 DIAGNOSIS — S42201D Unspecified fracture of upper end of right humerus, subsequent encounter for fracture with routine healing: Secondary | ICD-10-CM

## 2016-01-11 DIAGNOSIS — E039 Hypothyroidism, unspecified: Secondary | ICD-10-CM | POA: Insufficient documentation

## 2016-01-11 DIAGNOSIS — M810 Age-related osteoporosis without current pathological fracture: Secondary | ICD-10-CM | POA: Diagnosis not present

## 2016-01-11 DIAGNOSIS — S42401D Unspecified fracture of lower end of right humerus, subsequent encounter for fracture with routine healing: Secondary | ICD-10-CM | POA: Insufficient documentation

## 2016-01-11 DIAGNOSIS — K219 Gastro-esophageal reflux disease without esophagitis: Secondary | ICD-10-CM | POA: Diagnosis not present

## 2016-01-11 DIAGNOSIS — R0789 Other chest pain: Secondary | ICD-10-CM

## 2016-01-11 DIAGNOSIS — W19XXXD Unspecified fall, subsequent encounter: Secondary | ICD-10-CM

## 2016-01-11 DIAGNOSIS — Z800 Family history of malignant neoplasm of digestive organs: Secondary | ICD-10-CM | POA: Diagnosis not present

## 2016-01-11 DIAGNOSIS — I129 Hypertensive chronic kidney disease with stage 1 through stage 4 chronic kidney disease, or unspecified chronic kidney disease: Secondary | ICD-10-CM | POA: Insufficient documentation

## 2016-01-11 DIAGNOSIS — S42491A Other displaced fracture of lower end of right humerus, initial encounter for closed fracture: Secondary | ICD-10-CM | POA: Diagnosis not present

## 2016-01-11 DIAGNOSIS — N182 Chronic kidney disease, stage 2 (mild): Secondary | ICD-10-CM | POA: Diagnosis not present

## 2016-01-11 DIAGNOSIS — K5730 Diverticulosis of large intestine without perforation or abscess without bleeding: Secondary | ICD-10-CM | POA: Diagnosis not present

## 2016-01-11 DIAGNOSIS — Z8249 Family history of ischemic heart disease and other diseases of the circulatory system: Secondary | ICD-10-CM | POA: Diagnosis not present

## 2016-01-11 DIAGNOSIS — Z79899 Other long term (current) drug therapy: Secondary | ICD-10-CM | POA: Insufficient documentation

## 2016-01-11 DIAGNOSIS — E1122 Type 2 diabetes mellitus with diabetic chronic kidney disease: Secondary | ICD-10-CM | POA: Insufficient documentation

## 2016-01-11 DIAGNOSIS — M25511 Pain in right shoulder: Secondary | ICD-10-CM | POA: Diagnosis not present

## 2016-01-11 DIAGNOSIS — M25512 Pain in left shoulder: Secondary | ICD-10-CM | POA: Diagnosis not present

## 2016-01-11 DIAGNOSIS — D649 Anemia, unspecified: Secondary | ICD-10-CM | POA: Diagnosis not present

## 2016-01-11 DIAGNOSIS — Z86711 Personal history of pulmonary embolism: Secondary | ICD-10-CM | POA: Diagnosis not present

## 2016-01-11 DIAGNOSIS — S42291D Other displaced fracture of upper end of right humerus, subsequent encounter for fracture with routine healing: Secondary | ICD-10-CM | POA: Diagnosis not present

## 2016-01-11 DIAGNOSIS — Z23 Encounter for immunization: Secondary | ICD-10-CM | POA: Diagnosis not present

## 2016-01-11 DIAGNOSIS — S42351A Displaced comminuted fracture of shaft of humerus, right arm, initial encounter for closed fracture: Secondary | ICD-10-CM | POA: Diagnosis not present

## 2016-01-11 DIAGNOSIS — Z86718 Personal history of other venous thrombosis and embolism: Secondary | ICD-10-CM | POA: Diagnosis not present

## 2016-01-11 DIAGNOSIS — Z7984 Long term (current) use of oral hypoglycemic drugs: Secondary | ICD-10-CM | POA: Insufficient documentation

## 2016-01-11 DIAGNOSIS — M797 Fibromyalgia: Secondary | ICD-10-CM | POA: Diagnosis not present

## 2016-01-11 DIAGNOSIS — R110 Nausea: Secondary | ICD-10-CM

## 2016-01-11 DIAGNOSIS — S42255A Nondisplaced fracture of greater tuberosity of left humerus, initial encounter for closed fracture: Secondary | ICD-10-CM | POA: Diagnosis not present

## 2016-01-11 DIAGNOSIS — M25519 Pain in unspecified shoulder: Secondary | ICD-10-CM | POA: Diagnosis not present

## 2016-01-11 DIAGNOSIS — E785 Hyperlipidemia, unspecified: Secondary | ICD-10-CM | POA: Diagnosis not present

## 2016-01-11 DIAGNOSIS — K210 Gastro-esophageal reflux disease with esophagitis: Secondary | ICD-10-CM | POA: Diagnosis not present

## 2016-01-11 DIAGNOSIS — H409 Unspecified glaucoma: Secondary | ICD-10-CM | POA: Diagnosis not present

## 2016-01-11 DIAGNOSIS — E1142 Type 2 diabetes mellitus with diabetic polyneuropathy: Secondary | ICD-10-CM | POA: Diagnosis not present

## 2016-01-11 LAB — CBC WITH DIFFERENTIAL/PLATELET
BASOS ABS: 0 10*3/uL (ref 0.0–0.1)
Basophils Relative: 0 %
Eosinophils Absolute: 0.3 10*3/uL (ref 0.0–0.7)
Eosinophils Relative: 4 %
HEMATOCRIT: 37.1 % (ref 36.0–46.0)
Hemoglobin: 11.7 g/dL — ABNORMAL LOW (ref 12.0–15.0)
LYMPHS PCT: 19 %
Lymphs Abs: 1.3 10*3/uL (ref 0.7–4.0)
MCH: 28.3 pg (ref 26.0–34.0)
MCHC: 31.5 g/dL (ref 30.0–36.0)
MCV: 89.8 fL (ref 78.0–100.0)
Monocytes Absolute: 0.7 10*3/uL (ref 0.1–1.0)
Monocytes Relative: 10 %
NEUTROS ABS: 4.6 10*3/uL (ref 1.7–7.7)
NEUTROS PCT: 67 %
Platelets: 169 10*3/uL (ref 150–400)
RBC: 4.13 MIL/uL (ref 3.87–5.11)
RDW: 15.3 % (ref 11.5–15.5)
WBC: 7 10*3/uL (ref 4.0–10.5)

## 2016-01-11 LAB — TROPONIN I: Troponin I: <0.03 ng/mL (ref <0.03)

## 2016-01-11 LAB — BASIC METABOLIC PANEL
Anion gap: 6 (ref 5–15)
BUN: 11 mg/dL (ref 6–20)
CO2: 27 mmol/L (ref 22–32)
Calcium: 9.2 mg/dL (ref 8.9–10.3)
Chloride: 102 mmol/L (ref 101–111)
Creatinine, Ser: 0.88 mg/dL (ref 0.44–1.00)
GFR calc Af Amer: >60 mL/min (ref >60)
Glucose, Bld: 126 mg/dL — ABNORMAL HIGH (ref 65–99)
POTASSIUM: 3.6 mmol/L (ref 3.5–5.1)
Sodium: 135 mmol/L (ref 135–145)

## 2016-01-11 MED ORDER — HYDROMORPHONE HCL 1 MG/ML IJ SOLN
1.0000 mg | Freq: Once | INTRAMUSCULAR | Status: AC
  Administered 2016-01-11: 1 mg via INTRAVENOUS
  Filled 2016-01-11: qty 1

## 2016-01-11 MED ORDER — ONDANSETRON HCL 4 MG/2ML IJ SOLN
4.0000 mg | Freq: Once | INTRAMUSCULAR | Status: AC
  Administered 2016-01-11: 4 mg via INTRAVENOUS
  Filled 2016-01-11: qty 2

## 2016-01-11 MED ORDER — LORAZEPAM 2 MG/ML IJ SOLN
1.0000 mg | Freq: Once | INTRAMUSCULAR | Status: AC
  Administered 2016-01-11: 1 mg via INTRAVENOUS
  Filled 2016-01-11: qty 1

## 2016-01-11 MED ORDER — IOPAMIDOL (ISOVUE-370) INJECTION 76%
100.0000 mL | Freq: Once | INTRAVENOUS | Status: AC | PRN
  Administered 2016-01-11: 100 mL via INTRAVENOUS

## 2016-01-11 MED ORDER — HYDROMORPHONE HCL 2 MG PO TABS
2.0000 mg | ORAL_TABLET | ORAL | 0 refills | Status: DC | PRN
Start: 2016-01-11 — End: 2016-01-21

## 2016-01-11 NOTE — ED Notes (Signed)
Pt unable to sign d/c instructions but was able to place an X, witnessed by this nurse.  Pt verbalized understanding of d/c instructions.

## 2016-01-11 NOTE — ED Notes (Signed)
Pt just returned from CT.

## 2016-01-11 NOTE — Care Management (Signed)
CM faxed information for review to start [facility] services with [place].

## 2016-01-11 NOTE — Care Management Note (Signed)
Case Management Note  Patient Details  Name: Yesenia [name] MRN: [id number] Date of Birth: 30-Apr-1950  Subjective/Objective:                    Action/Plan: Patient to discharge home from the ED and will need home health services.  Patient currently has walker and bedside commode.  Services for home health: RN, PT, Nurse aide, Social Worker to follow up with other services for patient at home.  May qualify for in home aide from Social Services.    Expected Discharge Date: 01/11/16                 Expected Discharge Plan:  [place]  In-House Referral:  NA  Discharge planning Services     Post Acute Care Choice:    Choice offered to:  Patient  DME Arranged:    DME Agency:     HH Arranged:  RN, Nurse's Aide, Social Work, PT [place] Agency:  [facility]  Status of Service:  Completed, signed off  If discussed at [employer], dates discussed:    Additional Comments:  Briant Sites, RN 01/11/2016, 2:10 PM
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
## 2016-01-11 NOTE — Discharge Instructions (Signed)
Call your primary doctor on Monday for recheck.  Stop your other pain medications while taking the dilaudid.  Someone for case management or home health should contact you tomorrow

## 2016-01-11 NOTE — ED Notes (Signed)
Pt's sats dropped after pain med given to as low as 85%.  O2 applied [place] at 3 L/M.  sats increased to 93%

## 2016-01-11 NOTE — ED Triage Notes (Signed)
Pt reports continued pain in bilateral shoulders and reports left rib pain starting this am. Pt denies any other injury than fall last week. Pt alert and oriented. Pt reports rib cage pain is worse with inspiration and movement. nad noted. Pt denies being on any blood thinners. Pt reports took percocet prior to arrival with no relief of pain.

## 2016-01-11 NOTE — ED Notes (Signed)
Patient transported to CT 

## 2016-01-12 ENCOUNTER — Encounter (HOSPITAL_COMMUNITY): Payer: Self-pay | Admitting: Emergency Medicine

## 2016-01-12 ENCOUNTER — Emergency Department (HOSPITAL_COMMUNITY): Payer: Medicare Other

## 2016-01-12 ENCOUNTER — Inpatient Hospital Stay (HOSPITAL_COMMUNITY)
Admission: EM | Admit: 2016-01-12 | Discharge: 2016-01-21 | DRG: 493 | Disposition: A | Discharge status: Skilled Nursing Facility | Payer: Medicare Other | Attending: Internal Medicine | Admitting: Internal Medicine

## 2016-01-12 DIAGNOSIS — N182 Chronic kidney disease, stage 2 (mild): Secondary | ICD-10-CM | POA: Diagnosis not present

## 2016-01-12 DIAGNOSIS — Z86711 Personal history of pulmonary embolism: Secondary | ICD-10-CM

## 2016-01-12 DIAGNOSIS — Z7984 Long term (current) use of oral hypoglycemic drugs: Secondary | ICD-10-CM | POA: Diagnosis not present

## 2016-01-12 DIAGNOSIS — S42009A Fracture of unspecified part of unspecified clavicle, initial encounter for closed fracture: Secondary | ICD-10-CM | POA: Diagnosis not present

## 2016-01-12 DIAGNOSIS — K5790 Diverticulosis of intestine, part unspecified, without perforation or abscess without bleeding: Secondary | ICD-10-CM | POA: Diagnosis not present

## 2016-01-12 DIAGNOSIS — S42309A Unspecified fracture of shaft of humerus, unspecified arm, initial encounter for closed fracture: Secondary | ICD-10-CM | POA: Diagnosis not present

## 2016-01-12 DIAGNOSIS — M810 Age-related osteoporosis without current pathological fracture: Secondary | ICD-10-CM | POA: Diagnosis not present

## 2016-01-12 DIAGNOSIS — S42209A Unspecified fracture of upper end of unspecified humerus, initial encounter for closed fracture: Secondary | ICD-10-CM | POA: Diagnosis present

## 2016-01-12 DIAGNOSIS — E1142 Type 2 diabetes mellitus with diabetic polyneuropathy: Secondary | ICD-10-CM | POA: Diagnosis not present

## 2016-01-12 DIAGNOSIS — K210 Gastro-esophageal reflux disease with esophagitis: Secondary | ICD-10-CM | POA: Diagnosis not present

## 2016-01-12 DIAGNOSIS — E039 Hypothyroidism, unspecified: Secondary | ICD-10-CM | POA: Diagnosis not present

## 2016-01-12 DIAGNOSIS — Z833 Family history of diabetes mellitus: Secondary | ICD-10-CM

## 2016-01-12 DIAGNOSIS — R262 Difficulty in walking, not elsewhere classified: Secondary | ICD-10-CM | POA: Diagnosis not present

## 2016-01-12 DIAGNOSIS — Z6841 Body Mass Index (BMI) 40.0 and over, adult: Secondary | ICD-10-CM | POA: Diagnosis not present

## 2016-01-12 DIAGNOSIS — Z23 Encounter for immunization: Secondary | ICD-10-CM | POA: Diagnosis not present

## 2016-01-12 DIAGNOSIS — Z7901 Long term (current) use of anticoagulants: Secondary | ICD-10-CM | POA: Diagnosis not present

## 2016-01-12 DIAGNOSIS — Z86718 Personal history of other venous thrombosis and embolism: Secondary | ICD-10-CM

## 2016-01-12 DIAGNOSIS — M797 Fibromyalgia: Secondary | ICD-10-CM | POA: Diagnosis present

## 2016-01-12 DIAGNOSIS — D649 Anemia, unspecified: Secondary | ICD-10-CM | POA: Diagnosis present

## 2016-01-12 DIAGNOSIS — E785 Hyperlipidemia, unspecified: Secondary | ICD-10-CM | POA: Diagnosis present

## 2016-01-12 DIAGNOSIS — S42211A Unspecified displaced fracture of surgical neck of right humerus, initial encounter for closed fracture: Secondary | ICD-10-CM | POA: Diagnosis not present

## 2016-01-12 DIAGNOSIS — W010XXA Fall on same level from slipping, tripping and stumbling without subsequent striking against object, initial encounter: Secondary | ICD-10-CM | POA: Diagnosis present

## 2016-01-12 DIAGNOSIS — J9811 Atelectasis: Secondary | ICD-10-CM | POA: Diagnosis not present

## 2016-01-12 DIAGNOSIS — E1122 Type 2 diabetes mellitus with diabetic chronic kidney disease: Secondary | ICD-10-CM | POA: Diagnosis not present

## 2016-01-12 DIAGNOSIS — E662 Morbid (severe) obesity with alveolar hypoventilation: Secondary | ICD-10-CM | POA: Diagnosis present

## 2016-01-12 DIAGNOSIS — Z419 Encounter for procedure for purposes other than remedying health state, unspecified: Secondary | ICD-10-CM

## 2016-01-12 DIAGNOSIS — F410 Panic disorder [episodic paroxysmal anxiety] without agoraphobia: Secondary | ICD-10-CM | POA: Diagnosis present

## 2016-01-12 DIAGNOSIS — S42202A Unspecified fracture of upper end of left humerus, initial encounter for closed fracture: Secondary | ICD-10-CM | POA: Diagnosis not present

## 2016-01-12 DIAGNOSIS — H409 Unspecified glaucoma: Secondary | ICD-10-CM | POA: Diagnosis present

## 2016-01-12 DIAGNOSIS — S4290XA Fracture of unspecified shoulder girdle, part unspecified, initial encounter for closed fracture: Secondary | ICD-10-CM

## 2016-01-12 DIAGNOSIS — E1140 Type 2 diabetes mellitus with diabetic neuropathy, unspecified: Secondary | ICD-10-CM | POA: Diagnosis present

## 2016-01-12 DIAGNOSIS — K5900 Constipation, unspecified: Secondary | ICD-10-CM | POA: Diagnosis not present

## 2016-01-12 DIAGNOSIS — S42255A Nondisplaced fracture of greater tuberosity of left humerus, initial encounter for closed fracture: Secondary | ICD-10-CM | POA: Diagnosis not present

## 2016-01-12 DIAGNOSIS — E669 Obesity, unspecified: Secondary | ICD-10-CM | POA: Diagnosis present

## 2016-01-12 DIAGNOSIS — Z8249 Family history of ischemic heart disease and other diseases of the circulatory system: Secondary | ICD-10-CM | POA: Diagnosis not present

## 2016-01-12 DIAGNOSIS — I10 Essential (primary) hypertension: Secondary | ICD-10-CM | POA: Diagnosis not present

## 2016-01-12 DIAGNOSIS — S42491A Other displaced fracture of lower end of right humerus, initial encounter for closed fracture: Secondary | ICD-10-CM | POA: Diagnosis not present

## 2016-01-12 DIAGNOSIS — I129 Hypertensive chronic kidney disease with stage 1 through stage 4 chronic kidney disease, or unspecified chronic kidney disease: Secondary | ICD-10-CM | POA: Diagnosis not present

## 2016-01-12 DIAGNOSIS — E038 Other specified hypothyroidism: Secondary | ICD-10-CM

## 2016-01-12 DIAGNOSIS — S42201A Unspecified fracture of upper end of right humerus, initial encounter for closed fracture: Secondary | ICD-10-CM

## 2016-01-12 DIAGNOSIS — R52 Pain, unspecified: Secondary | ICD-10-CM | POA: Diagnosis not present

## 2016-01-12 DIAGNOSIS — E1169 Type 2 diabetes mellitus with other specified complication: Secondary | ICD-10-CM | POA: Diagnosis present

## 2016-01-12 DIAGNOSIS — M48061 Spinal stenosis, lumbar region without neurogenic claudication: Secondary | ICD-10-CM | POA: Diagnosis not present

## 2016-01-12 DIAGNOSIS — Z825 Family history of asthma and other chronic lower respiratory diseases: Secondary | ICD-10-CM

## 2016-01-12 DIAGNOSIS — Z821 Family history of blindness and visual loss: Secondary | ICD-10-CM

## 2016-01-12 DIAGNOSIS — Z800 Family history of malignant neoplasm of digestive organs: Secondary | ICD-10-CM | POA: Diagnosis not present

## 2016-01-12 DIAGNOSIS — E66813 Obesity, class 3: Secondary | ICD-10-CM | POA: Diagnosis present

## 2016-01-12 DIAGNOSIS — M25511 Pain in right shoulder: Secondary | ICD-10-CM | POA: Diagnosis present

## 2016-01-12 DIAGNOSIS — Z818 Family history of other mental and behavioral disorders: Secondary | ICD-10-CM

## 2016-01-12 DIAGNOSIS — K5730 Diverticulosis of large intestine without perforation or abscess without bleeding: Secondary | ICD-10-CM | POA: Diagnosis present

## 2016-01-12 DIAGNOSIS — I878 Other specified disorders of veins: Secondary | ICD-10-CM

## 2016-01-12 DIAGNOSIS — E1149 Type 2 diabetes mellitus with other diabetic neurological complication: Secondary | ICD-10-CM | POA: Diagnosis present

## 2016-01-12 DIAGNOSIS — S42252D Displaced fracture of greater tuberosity of left humerus, subsequent encounter for fracture with routine healing: Secondary | ICD-10-CM | POA: Diagnosis not present

## 2016-01-12 DIAGNOSIS — S42293P Other displaced fracture of upper end of unspecified humerus, subsequent encounter for fracture with malunion: Secondary | ICD-10-CM | POA: Diagnosis not present

## 2016-01-12 DIAGNOSIS — M6281 Muscle weakness (generalized): Secondary | ICD-10-CM | POA: Diagnosis not present

## 2016-01-12 DIAGNOSIS — S42351A Displaced comminuted fracture of shaft of humerus, right arm, initial encounter for closed fracture: Principal | ICD-10-CM | POA: Diagnosis present

## 2016-01-12 DIAGNOSIS — Z801 Family history of malignant neoplasm of trachea, bronchus and lung: Secondary | ICD-10-CM

## 2016-01-12 DIAGNOSIS — S42391A Other fracture of shaft of right humerus, initial encounter for closed fracture: Secondary | ICD-10-CM | POA: Diagnosis not present

## 2016-01-12 DIAGNOSIS — K219 Gastro-esophageal reflux disease without esophagitis: Secondary | ICD-10-CM | POA: Diagnosis not present

## 2016-01-12 DIAGNOSIS — S42212A Unspecified displaced fracture of surgical neck of left humerus, initial encounter for closed fracture: Secondary | ICD-10-CM | POA: Diagnosis not present

## 2016-01-12 DIAGNOSIS — M66322 Spontaneous rupture of flexor tendons, left upper arm: Secondary | ICD-10-CM | POA: Diagnosis not present

## 2016-01-12 DIAGNOSIS — M25512 Pain in left shoulder: Secondary | ICD-10-CM | POA: Diagnosis not present

## 2016-01-12 DIAGNOSIS — Z8262 Family history of osteoporosis: Secondary | ICD-10-CM

## 2016-01-12 DIAGNOSIS — R279 Unspecified lack of coordination: Secondary | ICD-10-CM | POA: Diagnosis not present

## 2016-01-12 DIAGNOSIS — M25519 Pain in unspecified shoulder: Secondary | ICD-10-CM | POA: Diagnosis not present

## 2016-01-12 LAB — URINALYSIS, ROUTINE W REFLEX MICROSCOPIC
Glucose, UA: NEGATIVE mg/dL
HGB URINE DIPSTICK: NEGATIVE
Leukocytes, UA: NEGATIVE
Nitrite: NEGATIVE
Protein, ur: NEGATIVE mg/dL
SPECIFIC GRAVITY, URINE: 1.025 (ref 1.005–1.030)
pH: 6 (ref 5.0–8.0)

## 2016-01-12 LAB — GLUCOSE, CAPILLARY: GLUCOSE-CAPILLARY: 121 mg/dL — AB (ref 65–99)

## 2016-01-12 MED ORDER — LORAZEPAM 2 MG/ML IJ SOLN
1.0000 mg | Freq: Once | INTRAMUSCULAR | Status: AC
  Administered 2016-01-12: 1 mg via INTRAVENOUS
  Filled 2016-01-12: qty 1

## 2016-01-12 MED ORDER — FENTANYL CITRATE (PF) 100 MCG/2ML IJ SOLN
50.0000 ug | Freq: Once | INTRAMUSCULAR | Status: AC
  Administered 2016-01-12: 50 ug via INTRAMUSCULAR

## 2016-01-12 MED ORDER — LINACLOTIDE 72 MCG PO CAPS: 72.0000 ug | ORAL_CAPSULE | Freq: Every day | ORAL | Status: DC

## 2016-01-12 MED ORDER — ONDANSETRON 4 MG PO TBDP
4.0000 mg | ORAL_TABLET | Freq: Once | ORAL | Status: AC
  Administered 2016-01-12: 4 mg via ORAL

## 2016-01-12 MED ORDER — HYDROMORPHONE 1 MG/ML IV SOLN
INTRAVENOUS | Status: DC
  Administered 2016-01-12 – 2016-01-13 (×2): 1 mg via INTRAVENOUS
  Administered 2016-01-13: 0.2 mg via INTRAVENOUS
  Administered 2016-01-13: 1 mg via INTRAVENOUS
  Administered 2016-01-13: 0.4 mg via INTRAVENOUS
  Administered 2016-01-13: 2.6 mg via INTRAVENOUS
  Administered 2016-01-14: 0.6 mg via INTRAVENOUS
  Administered 2016-01-14: 1 mg via INTRAVENOUS
  Filled 2016-01-12: qty 25

## 2016-01-12 MED ORDER — ONDANSETRON HCL 4 MG PO TABS
4.0000 mg | ORAL_TABLET | Freq: Once | ORAL | Status: DC
  Filled 2016-01-12: qty 1

## 2016-01-12 MED ORDER — FENTANYL CITRATE (PF) 100 MCG/2ML IJ SOLN
50.0000 ug | Freq: Once | INTRAMUSCULAR | Status: DC
  Filled 2016-01-12: qty 2

## 2016-01-12 MED ORDER — ONDANSETRON 4 MG PO TBDP
ORAL_TABLET | ORAL | Status: AC
  Filled 2016-01-12: qty 1

## 2016-01-12 MED ORDER — ACETAMINOPHEN 325 MG PO TABS: 650.0000 mg | ORAL_TABLET | Freq: Four times a day (QID) | ORAL | Status: DC | PRN

## 2016-01-12 MED ORDER — BUSPIRONE HCL 10 MG PO TABS
10.0000 mg | ORAL_TABLET | Freq: Three times a day (TID) | ORAL | Status: DC
  Administered 2016-01-13 – 2016-01-21 (×21): 10 mg via ORAL
  Filled 2016-01-12 (×22): qty 1

## 2016-01-12 MED ORDER — SIMVASTATIN 40 MG PO TABS
40.0000 mg | ORAL_TABLET | Freq: Every day | ORAL | Status: DC
  Administered 2016-01-13 – 2016-01-19 (×6): 40 mg via ORAL
  Filled 2016-01-12 (×6): qty 1

## 2016-01-12 MED ORDER — ONDANSETRON HCL 4 MG PO TABS: 4.0000 mg | ORAL_TABLET | Freq: Four times a day (QID) | ORAL | Status: DC | PRN

## 2016-01-12 MED ORDER — DOCUSATE SODIUM 100 MG PO CAPS
100.0000 mg | ORAL_CAPSULE | Freq: Two times a day (BID) | ORAL | Status: DC
  Administered 2016-01-13 – 2016-01-21 (×12): 100 mg via ORAL
  Filled 2016-01-12 (×15): qty 1

## 2016-01-12 MED ORDER — HYDROMORPHONE HCL 2 MG PO TABS
2.0000 mg | ORAL_TABLET | Freq: Once | ORAL | Status: AC
  Administered 2016-01-12: 2 mg via ORAL
  Filled 2016-01-12: qty 1

## 2016-01-12 MED ORDER — ENOXAPARIN SODIUM 60 MG/0.6ML ~~LOC~~ SOLN
60.0000 mg | SUBCUTANEOUS | Status: DC
  Administered 2016-01-13 – 2016-01-14 (×2): 60 mg via SUBCUTANEOUS
  Filled 2016-01-12 (×2): qty 0.6

## 2016-01-12 MED ORDER — DIPHENHYDRAMINE HCL 12.5 MG/5ML PO ELIX: 12.5000 mg | ORAL_SOLUTION | Freq: Four times a day (QID) | ORAL | Status: DC | PRN

## 2016-01-12 MED ORDER — TRAZODONE HCL 100 MG PO TABS
100.0000 mg | ORAL_TABLET | Freq: Every day | ORAL | Status: DC
  Administered 2016-01-13 – 2016-01-19 (×6): 100 mg via ORAL
  Filled 2016-01-12 (×6): qty 1

## 2016-01-12 MED ORDER — ONDANSETRON HCL 4 MG/2ML IJ SOLN
4.0000 mg | Freq: Four times a day (QID) | INTRAMUSCULAR | Status: DC | PRN
  Administered 2016-01-13 – 2016-01-16 (×4): 4 mg via INTRAVENOUS
  Filled 2016-01-12 (×4): qty 2

## 2016-01-12 MED ORDER — DIPHENHYDRAMINE HCL 50 MG/ML IJ SOLN: 12.5000 mg | Freq: Four times a day (QID) | INTRAMUSCULAR | Status: DC | PRN

## 2016-01-12 MED ORDER — INSULIN ASPART 100 UNIT/ML ~~LOC~~ SOLN
0.0000 [IU] | SUBCUTANEOUS | Status: DC
  Administered 2016-01-13: 3 [IU] via SUBCUTANEOUS
  Administered 2016-01-14: 4 [IU] via SUBCUTANEOUS
  Administered 2016-01-15 – 2016-01-16 (×3): 3 [IU] via SUBCUTANEOUS

## 2016-01-12 MED ORDER — PREGABALIN 100 MG PO CAPS
100.0000 mg | ORAL_CAPSULE | Freq: Three times a day (TID) | ORAL | Status: DC
  Administered 2016-01-13 – 2016-01-21 (×21): 100 mg via ORAL
  Filled 2016-01-12 (×21): qty 1

## 2016-01-12 MED ORDER — FAMOTIDINE 20 MG PO TABS
20.0000 mg | ORAL_TABLET | Freq: Every day | ORAL | Status: DC
  Administered 2016-01-13 – 2016-01-21 (×8): 20 mg via ORAL
  Filled 2016-01-12 (×8): qty 1

## 2016-01-12 MED ORDER — LACTATED RINGERS IV SOLN
INTRAVENOUS | Status: DC
  Administered 2016-01-13: 05:00:00 via INTRAVENOUS

## 2016-01-12 MED ORDER — ACETAMINOPHEN 650 MG RE SUPP: 650.0000 mg | Freq: Four times a day (QID) | RECTAL | Status: DC | PRN

## 2016-01-12 MED ORDER — LEVOTHYROXINE SODIUM 112 MCG PO TABS
112.0000 ug | ORAL_TABLET | Freq: Every day | ORAL | Status: DC
  Administered 2016-01-13 – 2016-01-21 (×10): 112 ug via ORAL
  Filled 2016-01-12 (×9): qty 1

## 2016-01-12 MED ORDER — PANTOPRAZOLE SODIUM 40 MG PO TBEC
40.0000 mg | DELAYED_RELEASE_TABLET | Freq: Every day | ORAL | Status: DC
  Administered 2016-01-13 – 2016-01-21 (×8): 40 mg via ORAL
  Filled 2016-01-12 (×8): qty 1

## 2016-01-12 MED ORDER — SODIUM CHLORIDE 0.9% FLUSH
9.0000 mL | INTRAVENOUS | Status: DC | PRN
Start: 2016-01-12 — End: 2016-01-14

## 2016-01-12 MED ORDER — HYDROCHLOROTHIAZIDE 25 MG PO TABS
25.0000 mg | ORAL_TABLET | Freq: Every day | ORAL | Status: DC
  Administered 2016-01-13 – 2016-01-18 (×6): 25 mg via ORAL
  Filled 2016-01-12 (×6): qty 1

## 2016-01-12 MED ORDER — POTASSIUM CHLORIDE CRYS ER 10 MEQ PO TBCR
10.0000 meq | EXTENDED_RELEASE_TABLET | Freq: Every day | ORAL | Status: DC
  Administered 2016-01-13 – 2016-01-21 (×8): 10 meq via ORAL
  Filled 2016-01-12 (×8): qty 1

## 2016-01-12 MED ORDER — NALOXONE HCL 0.4 MG/ML IJ SOLN: 0.4000 mg | INTRAMUSCULAR | Status: DC | PRN
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
## 2016-01-12 NOTE — ED Triage Notes (Signed)
Per EMS-pt sent to ED by [facility] Nurse for pain management and admission. Pt states she knocked her pills under a cabinet and can not get to them. Pt states her son has MS and is unable to care for her at home. Pt fell last Thursday and has bilateral humerus fractures. Pt seen in ED last night for same.

## 2016-01-12 NOTE — ED Notes (Signed)
Home Health nurse from [facility] called and stated she was at the pt's home to admit her to home health. States when she got there pt stated she had no one that could help her with her medications, meals or bathing. Home Health nurse stated she was sending the pt back to the ER for admission and pain management.
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
## 2016-01-12 NOTE — ED Notes (Signed)
Pt ambulated in hallway approximately 50 ft to and from nurse's station in hallway with stand by assist. Steady gait. 1 assist transfer to and from stretcher.

## 2016-01-12 NOTE — Progress Notes (Signed)
Patient ID: Yesenia [name], female   DOB: May 14, 1950, 65 y.o.   MRN: [id number] The humeral head is not dislocated   This is a common finding in shoulder injury and can be proven with CT

## 2016-01-12 NOTE — ED Notes (Signed)
Per Dr Wyvonnia Dusky iv may be placed in left arm,

## 2016-01-12 NOTE — ED Notes (Signed)
Pt sleepy after receiving pain medication, O2 saturation decreased to 85%. Pt placed on O2 at 2L [place].

## 2016-01-12 NOTE — ED Notes (Signed)
Pt up to bedside commode with 1 assist. Steady gait. Pt able to lift left hand to face and use cell phone.

## 2016-01-13 ENCOUNTER — Inpatient Hospital Stay (HOSPITAL_COMMUNITY): Payer: Medicare Other

## 2016-01-13 DIAGNOSIS — S42212A Unspecified displaced fracture of surgical neck of left humerus, initial encounter for closed fracture: Secondary | ICD-10-CM

## 2016-01-13 DIAGNOSIS — S42211A Unspecified displaced fracture of surgical neck of right humerus, initial encounter for closed fracture: Secondary | ICD-10-CM

## 2016-01-13 LAB — CBC
HCT: 34.9 % — ABNORMAL LOW (ref 36.0–46.0)
HEMOGLOBIN: 10.6 g/dL — AB (ref 12.0–15.0)
MCH: 28.1 pg (ref 26.0–34.0)
MCHC: 30.4 g/dL (ref 30.0–36.0)
MCV: 92.6 fL (ref 78.0–100.0)
Platelets: 196 10*3/uL (ref 150–400)
RBC: 3.77 MIL/uL — AB (ref 3.87–5.11)
RDW: 15.5 % (ref 11.5–15.5)
WBC: 7.1 10*3/uL (ref 4.0–10.5)

## 2016-01-13 LAB — BASIC METABOLIC PANEL
ANION GAP: 8 (ref 5–15)
BUN: 8 mg/dL (ref 6–20)
CHLORIDE: 101 mmol/L (ref 101–111)
CO2: 28 mmol/L (ref 22–32)
Calcium: 9 mg/dL (ref 8.9–10.3)
Creatinine, Ser: 0.91 mg/dL (ref 0.44–1.00)
GFR calc non Af Amer: >60 mL/min (ref >60)
Glucose, Bld: 103 mg/dL — ABNORMAL HIGH (ref 65–99)
Potassium: 3.5 mmol/L (ref 3.5–5.1)
Sodium: 137 mmol/L (ref 135–145)

## 2016-01-13 LAB — GLUCOSE, CAPILLARY
GLUCOSE-CAPILLARY: 99 mg/dL (ref 65–99)
GLUCOSE-CAPILLARY: 121 mg/dL — AB (ref 65–99)
GLUCOSE-CAPILLARY: 118 mg/dL — AB (ref 65–99)
Glucose-Capillary: 122 mg/dL — ABNORMAL HIGH (ref 65–99)
Glucose-Capillary: 94 mg/dL (ref 65–99)
Glucose-Capillary: 94 mg/dL (ref 65–99)

## 2016-01-13 LAB — MRSA PCR SCREENING: MRSA BY PCR: NEGATIVE

## 2016-01-13 LAB — TSH: TSH: 4.859 u[IU]/mL — ABNORMAL HIGH (ref 0.350–4.500)

## 2016-01-13 MED ORDER — INFLUENZA VAC SPLIT QUAD 0.5 ML IM SUSY
0.5000 mL | PREFILLED_SYRINGE | INTRAMUSCULAR | Status: AC
  Administered 2016-01-18: 0.5 mL via INTRAMUSCULAR
  Filled 2016-01-13: qty 0.5

## 2016-01-13 MED ORDER — LINACLOTIDE 145 MCG PO CAPS
145.0000 ug | ORAL_CAPSULE | Freq: Every day | ORAL | Status: DC
  Administered 2016-01-13 – 2016-01-21 (×8): 145 ug via ORAL
  Filled 2016-01-13 (×10): qty 1

## 2016-01-13 MED ORDER — LORAZEPAM 2 MG/ML IJ SOLN
0.5000 mg | INTRAMUSCULAR | Status: AC | PRN
  Administered 2016-01-13 – 2016-01-19 (×2): 0.5 mg via INTRAVENOUS
  Filled 2016-01-13 (×2): qty 1
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
## 2016-01-13 NOTE — Progress Notes (Signed)
PROGRESS NOTE  Yesenia [name] [id number] DOB: 1951-03-29 DOA: 01/12/2016 PCP: Vic Blackbird, MD   LOS: 1 day   Brief Narrative:  Yesenia [name] is a 65 y.o. female with medical history significant of DM, hypothyrdoism, remote h/o DVT/PE, HTN, fibromylagia, and GERD presenting for her 4th ED visit in as many days following a fall, was diagnosed with bilateral humerus fracture. Orthopedic surgery consulted.   Assessment & Plan: Principal Problem:   Humerus fracture Active Problems:   GERD   Essential hypertension, benign   Hypothyroidism   Morbid obesity (HCC)   Fibromyalgia   Type II diabetes mellitus with neurological manifestations (HCC)   CKD (chronic kidney disease), stage II   Hyperlipemia   Anemia   Bilateral humerus fractures - Very unfortunate situation with humerus fractures of both arms - Given the circumstances, patient is a relative invalid - she cannot comb her hair, brush her teeth, wipe her bottom, transfer from bed to chair or even stand up - She has had 3 prior visits (2 ER, 1 ortho) in the last 3 days and is clearly unable to care for herself at home - discussed with Dr. Marlou Sa today, will see in consultation, appreciate input.  - continue Dilaudid PCA  HTN - controlled, continue current regimen   DM - A1c in 2/17 was 6.4 (and this is true since 2015), so patient has good outpatient control - Will hold PO medications and cover with SSI  Hypothyroidism - TSH was elevated in 2/17 with normal T3 and free T4 - repeat TSH pending  CKD II - Cr stable  GERD - Continue Dexilant and Zantac (formulary substitution)  HLD - Continue Zocor  Anemia - Stable/better than baseline - Will trend  Fibromylagia - pain regimen as above for #1   DVT prophylaxis: Lovenox Code Status: Full Family Communication: no family bedside Disposition Plan: TBD  Consultants:   Orthopedic surgery, Dr. Marlou Sa   Procedures:   None    Antimicrobials:  None    Subjective: - complains of pain in her shoulders. No chest pain, no abdominal pain, no nausea or vomiting.   Objective: Vitals:   01/13/16 0049 01/13/16 0400 01/13/16 0432 01/13/16 0814  BP:   (!) 110/55   Pulse: 90  76   Resp:  18 12 17   Temp:   98.4 F (36.9 C)   TempSrc:   Oral   SpO2: 95% 95% 96% 96%  Weight: 119.7 kg (263 lb 14.3 oz)     Height: 5\' 1"  (1.549 m)       Intake/Output Summary (Last 24 hours) at 01/13/16 1049 Last data filed at 01/13/16 0900  Gross per 24 hour  Intake           794.75 ml  Output                0 ml  Net           794.75 ml   Filed Weights   01/12/16 1419 01/13/16 0049  Weight: 119.7 kg (264 lb) 119.7 kg (263 lb 14.3 oz)    Examination: Constitutional: NAD Vitals:   01/13/16 0049 01/13/16 0400 01/13/16 0432 01/13/16 0814  BP:   (!) 110/55   Pulse: 90  76   Resp:  18 12 17   Temp:   98.4 F (36.9 C)   TempSrc:   Oral   SpO2: 95% 95% 96% 96%  Weight: 119.7 kg (263 lb 14.3 oz)     Height: 5\' 1"  (  1.549 m)      Eyes: lids and conjunctivae normal ENMT: Mucous membranes are moist.  Respiratory: clear to auscultation bilaterally, no wheezing, no crackles.  Cardiovascular: Regular rate and rhythm, no murmurs / rubs / gallops. Abdomen: no tenderness. Bowel sounds positive.  Musculoskeletal: no clubbing / cyanosis.  Skin: no rashes, lesions, ulcers. No induration Neurologic: non focal   Data Reviewed: I have personally reviewed following labs and imaging studies  CBC:  Recent Labs Lab 01/11/16 1003 01/13/16 0409  WBC 7.0 7.1  NEUTROABS 4.6  --   HGB 11.7* 10.6*  HCT 37.1 34.9*  MCV 89.8 92.6  PLT 169 [id number]   Basic Metabolic Panel:  Recent Labs Lab 01/11/16 1003 01/13/16 0409  NA 135 137  K 3.6 3.5  CL 102 101  CO2 27 28  GLUCOSE 126* 103*  BUN 11 8  CREATININE 0.88 0.91  CALCIUM 9.2 9.0   GFR: Estimated Creatinine Clearance: 74.5 mL/min (by C-G formula based on SCr of 0.91  mg/dL). Liver Function Tests: No results for input(s): AST, ALT, ALKPHOS, BILITOT, PROT, ALBUMIN in the last 168 hours. No results for input(s): LIPASE, AMYLASE in the last 168 hours. No results for input(s): AMMONIA in the last 168 hours. Coagulation Profile: No results for input(s): INR, PROTIME in the last 168 hours. Cardiac Enzymes:  Recent Labs Lab 01/11/16 1003  TROPONINI <0.03   BNP (last 3 results) No results for input(s): PROBNP in the last 8760 hours. HbA1C: No results for input(s): HGBA1C in the last 72 hours. CBG:  Recent Labs Lab 01/12/16 2304 01/13/16 0017 01/13/16 0429 01/13/16 0754  GLUCAP 121* 122* 94 99   Lipid Profile: No results for input(s): CHOL, HDL, LDLCALC, TRIG, CHOLHDL, LDLDIRECT in the last 72 hours. Thyroid Function Tests: No results for input(s): TSH, T4TOTAL, FREET4, T3FREE, THYROIDAB in the last 72 hours. Anemia Panel: No results for input(s): VITAMINB12, FOLATE, FERRITIN, TIBC, IRON, RETICCTPCT in the last 72 hours. Urine analysis:    Component Value Date/Time   COLORURINE YELLOW 01/12/2016 1830   APPEARANCEUR CLEAR 01/12/2016 1830   LABSPEC 1.025 01/12/2016 1830   PHURINE 6.0 01/12/2016 1830   GLUCOSEU NEGATIVE 01/12/2016 1830   HGBUR NEGATIVE 01/12/2016 1830   BILIRUBINUR SMALL (A) 01/12/2016 1830   KETONESUR TRACE (A) 01/12/2016 1830   PROTEINUR NEGATIVE 01/12/2016 1830   UROBILINOGEN 0.2 12/30/2012 1300   NITRITE NEGATIVE 01/12/2016 1830   LEUKOCYTESUR NEGATIVE 01/12/2016 1830   Sepsis Labs: Invalid input(s): PROCALCITONIN, LACTICIDVEN  Recent Results (from the past 240 hour(s))  MRSA PCR Screening     Status: None   Collection Time: 01/12/16 11:11 PM  Result Value Ref Range Status   MRSA by PCR NEGATIVE NEGATIVE Final    Comment:        The GeneXpert MRSA Assay (FDA approved for NASAL specimens only), is one component of a comprehensive MRSA colonization surveillance program. It is not intended to diagnose  MRSA infection nor to guide or monitor treatment for MRSA infections.       Radiology Studies: Dg Shoulder Right  Result Date: 01/12/2016 CLINICAL DATA:  Patient presents for pain management an admission. EXAM: RIGHT SHOULDER - 2+ VIEW COMPARISON:  None. FINDINGS: There is an inferior dislocation of the right humeral head on the glenoid, with mild posterior component. There is a comminuted impacted fracture of the proximal right humeral shaft with mild anterior displacement of the distal fracture fragment. IMPRESSION: Comminuted impacted fracture of the proximal right humeral shaft. Inferior dislocation of  the right humeral head in relation to the glenoid with mild posterior component. Electronically Signed   By: Fidela Salisbury M.D.   On: 01/12/2016 16:08   Ct Angio Chest Pe W And/or Wo Contrast  Result Date: 01/11/2016 CLINICAL DATA:  Continued pain in both shoulders and left arm, particularly over the last week. EXAM: CT ANGIOGRAPHY CHEST WITH CONTRAST TECHNIQUE: Multidetector CT imaging of the chest was performed using the standard protocol during bolus administration of intravenous contrast. Multiplanar CT image reconstructions and MIPs were obtained to evaluate the vascular anatomy. CONTRAST:  100 cc Isovue 370 COMPARISON:  10/10/2013 FINDINGS: Cardiovascular: Pulmonary arterial opacification is good. No pulmonary emboli. There is aortic atherosclerosis. No aneurysm or dissection. No coronary artery calcification is seen. The heart is mildly enlarged. No pericardial fluid. Mediastinum/Nodes: No hilar or mediastinal mass or lymphadenopathy. Lungs/Pleura: Mild scarring or atelectasis at both lung bases. No sign of active infiltrate. No effusion. Upper Abdomen: Negative. Previous cholecystectomy. Benign right renal cyst. Musculoskeletal: Ordinary degenerative changes affect the spine. Review of the MIP images confirms the above findings. IMPRESSION: No pulmonary emboli. No acute aortic disease.  Aortic atherosclerosis without aneurysm or dissection. Mild scarring or atelectasis at both lung bases. Electronically Signed   By: Nelson Chimes M.D.   On: 01/11/2016 13:11   Ct Shoulder Right Wo Contrast  Result Date: 01/12/2016 CLINICAL DATA:  Evaluate proximal humerus fracture. EXAM: CT OF THE RIGHT SHOULDER WITHOUT CONTRAST TECHNIQUE: Multidetector CT imaging was performed according to the standard protocol. Multiplanar CT image reconstructions were also generated. COMPARISON:  Radiographs same date. FINDINGS: There is a comminuted displaced fracture involving the proximal shaft of the humerus. This does involve the humeral neck but this portion of the fracture is not displaced. No fracture of the greater tuberosity or lesser tuberosity. The humeral head is normally located in the glenoid fossa. The [facility] joint is intact. No clavicle or scapular fracture. The visualized right ribs are intact. The visualized right lung is grossly clear. IMPRESSION: Complex comminuted proximal humeral shaft fracture. There is involvement of the humeral neck but no displacement. No involvement of the greater or lesser tuberosities. Electronically Signed   By: Marijo Sanes M.D.   On: 01/12/2016 18:26   Dg Shoulder Left  Addendum Date: 01/12/2016   ADDENDUM REPORT: 01/12/2016 16:10 ADDENDUM: The bony fragments seen superior and lateral to the humeral head represent a known acute fracture, rather than an old injury as previously described. There is no residual dislocation. Electronically Signed   By: Fidela Salisbury M.D.   On: 01/12/2016 16:10   Result Date: 01/12/2016 CLINICAL DATA:  Bilateral shoulder pain. EXAM: LEFT SHOULDER - 2+ VIEW COMPARISON:  01/09/2016 FINDINGS: There is no evidence of acute fracture or dislocation. Well corticated calcific fragments from previous fracture dislocation of the left humeral head are seen along the lateral and superior margin of the left femoral head. IMPRESSION: No evidence of acute  fracture or dislocation of the left shoulder. Well corticated calcific fragment from previous healed fracture dislocation of the left humeral head are present. Electronically Signed: By: Fidela Salisbury M.D. On: 01/12/2016 15:58     Scheduled Meds: . busPIRone  10 mg Oral TID  . docusate sodium  100 mg Oral BID  . enoxaparin (LOVENOX) injection  60 mg Subcutaneous Q24H  . famotidine  20 mg Oral Daily  . hydrochlorothiazide  25 mg Oral Daily  . HYDROmorphone   Intravenous Q4H  . [START ON 01/14/2016] Influenza vac split quadrivalent PF  0.5 mL Intramuscular Tomorrow-1000  . insulin aspart  0-20 Units Subcutaneous Q4H  . levothyroxine  112 mcg Oral QAC breakfast  . linaclotide  145 mcg Oral QAC breakfast  . pantoprazole  40 mg Oral Daily  . potassium chloride  10 mEq Oral Daily  . pregabalin  100 mg Oral TID  . simvastatin  40 mg Oral QHS  . traZODone  100 mg Oral QHS   Continuous Infusions:    Marzetta Board, MD, PhD [facility] Pager [phone]  If 7PM-7AM, please contact night-coverage [email or web address] Password [email or web address] 01/13/2016, 10:49 AM

## 2016-01-13 NOTE — Clinical Social Work Note (Signed)
Clinical Social Work Assessment  Patient Details  Name: Yesenia [name] MRN: [id number] Date of Birth: 10/15/1950  Date of referral:  01/13/16               Reason for consult:  Facility Placement                Permission sought to share information with:   (Facilities) Permission granted to share information::   (Son)  Name::        Agency::     Relationship::     Contact Information:     Housing/Transportation Living arrangements for the past 2 months:  Single Family Home Source of Information:  Patient Patient Interpreter Needed:  None Criminal Activity/Legal Involvement Pertinent to Current Situation/Hospitalization:  No - Comment as needed Significant Relationships:  Adult Children (Son/Michael [phone]) Lives with:  Self Do you feel safe going back to the place where you live?  Yes Need for family participation in patient care:  Yes (Comment) (States son is primary support)  Care giving concerns:  Patient states that if rehab is needed she is not sure if she wants rehab. Patient was tearful when talking about SNF. Patient states that she would like to go home. SW reached out to son with patient's permission. Patient states that she wants to speak with son.   Social Worker assessment / plan:  SW met with patient at bedside. Patient was alert and oriented. There was no family present. SW spoke with pt about SNF. However,she declined at this time.   Employment status:  Retired Insurance information:   ([employer]) PT Recommendations:    Information / Referral to community resources:   (Patient does not give SW permission to reach out to facilities. Patient states that she would like to go home upon discharge. Patient states that she needs to speeak with son. SW reached out to son. However, he didnt answer.)  Patient/Family's Response to care:  Tearful  Patient/Family's Understanding of and Emotional Response to Diagnosis, Current Treatment, and Prognosis:  No  questions.  Emotional Assessment Appearance:  Appears stated age Attitude/Demeanor/Rapport:  Crying Affect (typically observed):  Apprehensive Orientation:  Oriented to Self, Oriented to Place, Oriented to  Time Alcohol / Substance use:  Not Applicable Psych involvement (Current and /or in the community):  No (Comment)  Discharge Needs  Concerns to be addressed:  Adjustment to Illness Readmission within the last 30 days:  No Current discharge risk:  None Barriers to Discharge:  No Barriers Identified   Whitaker, Brittney R 01/13/2016, 4:32 PM  
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
## 2016-01-14 LAB — BASIC METABOLIC PANEL
ANION GAP: 10 (ref 5–15)
BUN: 9 mg/dL (ref 6–20)
CHLORIDE: 100 mmol/L — AB (ref 101–111)
CO2: 26 mmol/L (ref 22–32)
Calcium: 9.2 mg/dL (ref 8.9–10.3)
Creatinine, Ser: 0.97 mg/dL (ref 0.44–1.00)
GFR calc Af Amer: >60 mL/min (ref >60)
GFR, EST NON AFRICAN AMERICAN: 60 mL/min — AB (ref >60)
GLUCOSE: 97 mg/dL (ref 65–99)
POTASSIUM: 3.6 mmol/L (ref 3.5–5.1)
Sodium: 136 mmol/L (ref 135–145)

## 2016-01-14 LAB — GLUCOSE, CAPILLARY
GLUCOSE-CAPILLARY: 112 mg/dL — AB (ref 65–99)
GLUCOSE-CAPILLARY: 113 mg/dL — AB (ref 65–99)
Glucose-Capillary: 106 mg/dL — ABNORMAL HIGH (ref 65–99)
Glucose-Capillary: 102 mg/dL — ABNORMAL HIGH (ref 65–99)
Glucose-Capillary: 138 mg/dL — ABNORMAL HIGH (ref 65–99)
Glucose-Capillary: 118 mg/dL — ABNORMAL HIGH (ref 65–99)

## 2016-01-14 LAB — CBC
HCT: 36.1 % (ref 36.0–46.0)
HEMOGLOBIN: 11 g/dL — AB (ref 12.0–15.0)
MCH: 28.6 pg (ref 26.0–34.0)
MCHC: 30.5 g/dL (ref 30.0–36.0)
MCV: 93.8 fL (ref 78.0–100.0)
Platelets: 195 10*3/uL (ref 150–400)
RBC: 3.85 MIL/uL — ABNORMAL LOW (ref 3.87–5.11)
RDW: 15.7 % — ABNORMAL HIGH (ref 11.5–15.5)
WBC: 6.1 10*3/uL (ref 4.0–10.5)

## 2016-01-14 LAB — T4, FREE: FREE T4: 0.78 ng/dL (ref 0.61–1.12)

## 2016-01-14 MED ORDER — HYDROMORPHONE HCL 1 MG/ML IJ SOLN
1.0000 mg | INTRAMUSCULAR | Status: DC | PRN
  Administered 2016-01-14 – 2016-01-21 (×33): 1 mg via INTRAVENOUS
  Filled 2016-01-14 (×33): qty 1

## 2016-01-14 MED ORDER — OXYCODONE-ACETAMINOPHEN 5-325 MG PO TABS
1.0000 | ORAL_TABLET | Freq: Four times a day (QID) | ORAL | Status: DC | PRN
Start: 2016-01-14 — End: 2016-01-16
  Administered 2016-01-14 – 2016-01-16 (×5): 2 via ORAL
  Filled 2016-01-14 (×6): qty 2

## 2016-01-14 MED FILL — Fentanyl Citrate Preservative Free (PF) Inj 100 MCG/2ML: INTRAMUSCULAR | Qty: 2 | Status: AC

## 2016-01-14 NOTE — Progress Notes (Addendum)
PROGRESS NOTE  AZRIEL MCKELLER [id number] DOB: July 29, 1950 DOA: 01/12/2016 PCP: Vic Blackbird, MD   LOS: 2 days   Brief Narrative: Yesenia [name] is a 65 y.o. female with medical history significant of DM, hypothyrdoism, remote h/o DVT/PE, HTN, fibromylagia, and GERD presenting for her 4th ED visit in as many days following a fall, was diagnosed with bilateral humerus fracture. Orthopedic surgery consulted, plan to be taken for operative repair on 10/11.  Assessment & Plan: Principal Problem:   Humerus fracture Active Problems:   GERD   Essential hypertension, benign   Hypothyroidism   Morbid obesity (HCC)   Fibromyalgia   Type II diabetes mellitus with neurological manifestations (HCC)   CKD (chronic kidney disease), stage II   Hyperlipemia   Anemia   Bilateral humerus fractures - Very unfortunate situation with humerus fractures of both arms - Given the circumstances, patient is a relative invalid - she cannot comb her hair, brush her teeth, wipe her bottom, transfer from bed to chair or even stand up - discussed with Dr. Marlou Sa, plan for operative repair on right shoulder tomorrow and potential left repair as well - patient not getting enough pain relief with dilaudid PCA, discontinued, place on dilaudid IV prn and percocet for breakthrough  HTN - fair control, continue current regimen   DM - A1c in 2/17 was 6.4 (and this is true since 2015), so patient has good outpatient control - Will hold PO medications and cover with SSI  Hypothyroidism - TSH was elevated in 2/17 with normal T3 and free T4 - repeat TSH elevated at 4.8, normal free T4. No change in regimen, recheck TSH in 3-4 weeks as an outpatient  CKD II - Cr stable  GERD - Continue Dexilant and Zantac (formulary substitution)  HLD - Continue Zocor  Anemia - Stable/better than baseline - Will trend  Fibromylagia - pain regimen as above for #1   DVT prophylaxis: Lovenox Code Status:  Full Family Communication: d/w son at bedside Disposition Plan: TBD  Consultants:   Orthopedic surgery, Dr. Marlou Sa   Procedures:   None   Antimicrobials:  None    Subjective: - complains of pain in her shoulders. No chest pain, no abdominal pain, no nausea or vomiting.   Objective: Vitals:   01/14/16 0400 01/14/16 0434 01/14/16 0800 01/14/16 1200  BP:  136/66    Pulse:  66    Resp: 20 17 (!) 9 10  Temp:  97.6 F (36.4 C)    TempSrc:  Oral    SpO2: 96% 98% 96% 97%  Weight:      Height:        Intake/Output Summary (Last 24 hours) at 01/14/16 1329 Last data filed at 01/14/16 0900  Gross per 24 hour  Intake              120 ml  Output                0 ml  Net              120 ml   Filed Weights   01/12/16 1419 01/13/16 0049  Weight: 119.7 kg (264 lb) 119.7 kg (263 lb 14.3 oz)    Examination: Constitutional: NAD Vitals:   01/14/16 0400 01/14/16 0434 01/14/16 0800 01/14/16 1200  BP:  136/66    Pulse:  66    Resp: 20 17 (!) 9 10  Temp:  97.6 F (36.4 C)    TempSrc:  Oral  SpO2: 96% 98% 96% 97%  Weight:      Height:       Eyes: lids and conjunctivae normal ENMT: Mucous membranes are moist.  Respiratory: clear to auscultation bilaterally, no wheezing, no crackles.  Cardiovascular: Regular rate and rhythm, no murmurs / rubs / gallops. Abdomen: no tenderness. Bowel sounds positive.  Musculoskeletal: no clubbing / cyanosis.  Skin: no rashes, lesions, ulcers. No induration Neurologic: non focal   Data Reviewed: I have personally reviewed following labs and imaging studies  CBC:  Recent Labs Lab 01/11/16 1003 01/13/16 0409 01/14/16 0725  WBC 7.0 7.1 6.1  NEUTROABS 4.6  --   --   HGB 11.7* 10.6* 11.0*  HCT 37.1 34.9* 36.1  MCV 89.8 92.6 93.8  PLT 169 196 0000000   Basic Metabolic Panel:  Recent Labs Lab 01/11/16 1003 01/13/16 0409 01/14/16 0432  NA 135 137 136  K 3.6 3.5 3.6  CL 102 101 100*  CO2 27 28 26   GLUCOSE 126* 103* 97  BUN 11 8 9     CREATININE 0.88 0.91 0.97  CALCIUM 9.2 9.0 9.2   GFR: Estimated Creatinine Clearance: 69.9 mL/min (by C-G formula based on SCr of 0.97 mg/dL). Liver Function Tests: No results for input(s): AST, ALT, ALKPHOS, BILITOT, PROT, ALBUMIN in the last 168 hours. No results for input(s): LIPASE, AMYLASE in the last 168 hours. No results for input(s): AMMONIA in the last 168 hours. Coagulation Profile: No results for input(s): INR, PROTIME in the last 168 hours. Cardiac Enzymes:  Recent Labs Lab 01/11/16 1003  TROPONINI <0.03   BNP (last 3 results) No results for input(s): PROBNP in the last 8760 hours. HbA1C: No results for input(s): HGBA1C in the last 72 hours. CBG:  Recent Labs Lab 01/13/16 2029 01/14/16 0013 01/14/16 0425 01/14/16 0804 01/14/16 1146  GLUCAP 118* 113* 106* 112* 102*   Lipid Profile: No results for input(s): CHOL, HDL, LDLCALC, TRIG, CHOLHDL, LDLDIRECT in the last 72 hours. Thyroid Function Tests:  Recent Labs  01/12/16 1003  TSH 4.859*   Anemia Panel: No results for input(s): VITAMINB12, FOLATE, FERRITIN, TIBC, IRON, RETICCTPCT in the last 72 hours. Urine analysis:    Component Value Date/Time   COLORURINE YELLOW 01/12/2016 1830   APPEARANCEUR CLEAR 01/12/2016 1830   LABSPEC 1.025 01/12/2016 1830   PHURINE 6.0 01/12/2016 1830   GLUCOSEU NEGATIVE 01/12/2016 1830   HGBUR NEGATIVE 01/12/2016 1830   BILIRUBINUR SMALL (A) 01/12/2016 1830   KETONESUR TRACE (A) 01/12/2016 1830   PROTEINUR NEGATIVE 01/12/2016 1830   UROBILINOGEN 0.2 12/30/2012 1300   NITRITE NEGATIVE 01/12/2016 1830   LEUKOCYTESUR NEGATIVE 01/12/2016 1830   Sepsis Labs: Invalid input(s): PROCALCITONIN, LACTICIDVEN  Recent Results (from the past 240 hour(s))  MRSA PCR Screening     Status: None   Collection Time: 01/12/16 11:11 PM  Result Value Ref Range Status   MRSA by PCR NEGATIVE NEGATIVE Final    Comment:        The GeneXpert MRSA Assay (FDA approved for NASAL  specimens only), is one component of a comprehensive MRSA colonization surveillance program. It is not intended to diagnose MRSA infection nor to guide or monitor treatment for MRSA infections.       Radiology Studies: Dg Shoulder Right  Result Date: 01/12/2016 CLINICAL DATA:  Patient presents for pain management an admission. EXAM: RIGHT SHOULDER - 2+ VIEW COMPARISON:  None. FINDINGS: There is an inferior dislocation of the right humeral head on the glenoid, with mild posterior component. There  is a comminuted impacted fracture of the proximal right humeral shaft with mild anterior displacement of the distal fracture fragment. IMPRESSION: Comminuted impacted fracture of the proximal right humeral shaft. Inferior dislocation of the right humeral head in relation to the glenoid with mild posterior component. Electronically Signed   By: Fidela Salisbury M.D.   On: 01/12/2016 16:08   Ct Humerus Left Wo Contrast  Result Date: 01/13/2016 CLINICAL DATA:  Left proximal humerus fracture/dislocation on 01/09/2016. EXAM: CT OF THE LEFT SHOULDER WITHOUT CONTRAST; CT OF THE LEFT HUMERUS WITHOUT CONTRAST TECHNIQUE: Multidetector CT imaging was performed according to the standard protocol. Multiplanar CT image reconstructions were also generated. COMPARISON:  Radiographs dated 01/12/2016 and 01/09/2016 FINDINGS: CT SCAN OF THE LEFT SHOULDER: There is a displaced 3.5 by 2.7 cm comminuted fracture of the greater tuberosity of the proximal left humerus. This involves the insertions of supraspinous, infraspinatus, and teres minor tendons. Fragments are displaced posteriorly as well as anterosuperiorly, best seen on the sagittal images. Dislocation has been reduced. Subscapularis insertion appears to be intact. There is a tiny hairline nondisplaced fracture of the anterior inferior aspect of the glenoid best seen on image 51 of series 10. AC joint is normal. Scapula is normal. CT SCAN OF THE LEFT HUMERUS: Again  noted is the comminuted avulsion fracture of the greater tuberosity of the proximal left humerus. Displaced fragments are seen in posteriorly and anteriorly superiorly as described above. The humeral shaft is intact. Distal humeral metaphysis is not included on the CT scan. IMPRESSION: 1. Comminuted avulsion fracture of the greater tuberosity of the proximal humerus involving the insertions of the supraspinous, infraspinatus and teres minor tendons. 2. Humeral shaft is intact. 3. Dislocation has been reduced. 4. Tiny nondisplaced hairline fracture of the anterior inferior aspect of the glenoid. Electronically Signed   By: Lorriane Shire M.D.   On: 01/13/2016 13:54   Ct Shoulder Left Wo Contrast  Result Date: 01/13/2016 CLINICAL DATA:  Left proximal humerus fracture/dislocation on 01/09/2016. EXAM: CT OF THE LEFT SHOULDER WITHOUT CONTRAST; CT OF THE LEFT HUMERUS WITHOUT CONTRAST TECHNIQUE: Multidetector CT imaging was performed according to the standard protocol. Multiplanar CT image reconstructions were also generated. COMPARISON:  Radiographs dated 01/12/2016 and 01/09/2016 FINDINGS: CT SCAN OF THE LEFT SHOULDER: There is a displaced 3.5 by 2.7 cm comminuted fracture of the greater tuberosity of the proximal left humerus. This involves the insertions of supraspinous, infraspinatus, and teres minor tendons. Fragments are displaced posteriorly as well as anterosuperiorly, best seen on the sagittal images. Dislocation has been reduced. Subscapularis insertion appears to be intact. There is a tiny hairline nondisplaced fracture of the anterior inferior aspect of the glenoid best seen on image 51 of series 10. AC joint is normal. Scapula is normal. CT SCAN OF THE LEFT HUMERUS: Again noted is the comminuted avulsion fracture of the greater tuberosity of the proximal left humerus. Displaced fragments are seen in posteriorly and anteriorly superiorly as described above. The humeral shaft is intact. Distal humeral  metaphysis is not included on the CT scan. IMPRESSION: 1. Comminuted avulsion fracture of the greater tuberosity of the proximal humerus involving the insertions of the supraspinous, infraspinatus and teres minor tendons. 2. Humeral shaft is intact. 3. Dislocation has been reduced. 4. Tiny nondisplaced hairline fracture of the anterior inferior aspect of the glenoid. Electronically Signed   By: Lorriane Shire M.D.   On: 01/13/2016 13:54   Ct Shoulder Right Wo Contrast  Result Date: 01/12/2016 CLINICAL DATA:  Evaluate proximal  humerus fracture. EXAM: CT OF THE RIGHT SHOULDER WITHOUT CONTRAST TECHNIQUE: Multidetector CT imaging was performed according to the standard protocol. Multiplanar CT image reconstructions were also generated. COMPARISON:  Radiographs same date. FINDINGS: There is a comminuted displaced fracture involving the proximal shaft of the humerus. This does involve the humeral neck but this portion of the fracture is not displaced. No fracture of the greater tuberosity or lesser tuberosity. The humeral head is normally located in the glenoid fossa. The [facility] joint is intact. No clavicle or scapular fracture. The visualized right ribs are intact. The visualized right lung is grossly clear. IMPRESSION: Complex comminuted proximal humeral shaft fracture. There is involvement of the humeral neck but no displacement. No involvement of the greater or lesser tuberosities. Electronically Signed   By: Marijo Sanes M.D.   On: 01/12/2016 18:26   Dg Shoulder Left  Addendum Date: 01/12/2016   ADDENDUM REPORT: 01/12/2016 16:10 ADDENDUM: The bony fragments seen superior and lateral to the humeral head represent a known acute fracture, rather than an old injury as previously described. There is no residual dislocation. Electronically Signed   By: Fidela Salisbury M.D.   On: 01/12/2016 16:10   Result Date: 01/12/2016 CLINICAL DATA:  Bilateral shoulder pain. EXAM: LEFT SHOULDER - 2+ VIEW COMPARISON:   01/09/2016 FINDINGS: There is no evidence of acute fracture or dislocation. Well corticated calcific fragments from previous fracture dislocation of the left humeral head are seen along the lateral and superior margin of the left femoral head. IMPRESSION: No evidence of acute fracture or dislocation of the left shoulder. Well corticated calcific fragment from previous healed fracture dislocation of the left humeral head are present. Electronically Signed: By: Fidela Salisbury M.D. On: 01/12/2016 15:58     Scheduled Meds: . busPIRone  10 mg Oral TID  . docusate sodium  100 mg Oral BID  . enoxaparin (LOVENOX) injection  60 mg Subcutaneous Q24H  . famotidine  20 mg Oral Daily  . hydrochlorothiazide  25 mg Oral Daily  . HYDROmorphone   Intravenous Q4H  . Influenza vac split quadrivalent PF  0.5 mL Intramuscular Tomorrow-1000  . insulin aspart  0-20 Units Subcutaneous Q4H  . levothyroxine  112 mcg Oral QAC breakfast  . linaclotide  145 mcg Oral QAC breakfast  . pantoprazole  40 mg Oral Daily  . potassium chloride  10 mEq Oral Daily  . pregabalin  100 mg Oral TID  . simvastatin  40 mg Oral QHS  . traZODone  100 mg Oral QHS   Continuous Infusions:    Marzetta Board, MD, PhD [facility] Pager [phone]  If 7PM-7AM, please contact night-coverage [email or web address] Password [email or web address] 01/14/2016, 1:29 PM

## 2016-01-14 NOTE — Progress Notes (Signed)
Left shoulder also may require surgery.  Comminuted fracture of the greater tuberosity and possibly part of the lesser tuberosity indicating significant rotator cuff pathology is noted on CT scan  As far as the right shoulder goes surgery planned for tomorrow with open reduction internal fixation and plating.  We may perform left surgery at the same time or potentially wait until she becomes somewhat functional with the right arm to do the second surgery.  Next week  Nothing by mouth after 9 o'clock a.m. Tomorrow Hold Lovenox dose tomorrow morning All QUESTIONS answered

## 2016-01-14 NOTE — Consult Note (Signed)
[facility] CM Primary Care Navigator  01/14/2016  Yesenia [name] 08-Feb-1951 [id number]  Met with patient at the bedside to identify possible discharge needs. Patient mentioned having a fall at home and injured or fractured both upper extremities/ shoulder which led to this admission and surgery at a later date as stated. She confirms Dr. Vic [name] at [name] as the primary care provider.    Patient verbalized using Yesenia [name]'s pharmacy in [name] to obtain medications without difficulty. She is managing her own medicines at home from "blister pack" system provided by pharmacy.  Patient transports self prior to this admission. Her son Yesenia [name]) will be able to provide transportation to doctors' appointments and he will be the primary caregiver at home per patient.  Discharge plan still not decided upon at this point per patient but she was receptive and states will be open and flexible to recommendations if it is for her best benefit.   Patient expressed understanding to call primary care provider's office for a post discharge follow-up appointment within a week or sooner if needs arise when she gets home.  Patient had verbally expressed interest for [facility] referral for disease management, diet education and monitoring especially with DM and HTN.   For additional questions please contact:  Yesenia [name] A. Yesenia [name], BSN, RN-BC [facility] PRIMARY CARE Navigator Cell: [phone]

## 2016-01-14 NOTE — Progress Notes (Signed)
Pt PCA d/ced per MD order. Pt repts that while on PCA pain not "well controlled". Will continue to monitor.

## 2016-01-15 ENCOUNTER — Inpatient Hospital Stay (HOSPITAL_COMMUNITY): Payer: Medicare Other | Admitting: Certified Registered Nurse Anesthetist

## 2016-01-15 ENCOUNTER — Inpatient Hospital Stay (HOSPITAL_COMMUNITY): Payer: Medicare Other

## 2016-01-15 ENCOUNTER — Encounter (HOSPITAL_COMMUNITY): Payer: Self-pay | Admitting: Surgery

## 2016-01-15 ENCOUNTER — Encounter (HOSPITAL_COMMUNITY)
Admission: EM | Disposition: A | Discharge status: Skilled Nursing Facility | Payer: Self-pay | Source: Home / Self Care | Attending: Internal Medicine

## 2016-01-15 DIAGNOSIS — K210 Gastro-esophageal reflux disease with esophagitis: Secondary | ICD-10-CM

## 2016-01-15 DIAGNOSIS — S42201A Unspecified fracture of upper end of right humerus, initial encounter for closed fracture: Secondary | ICD-10-CM

## 2016-01-15 DIAGNOSIS — S42211A Unspecified displaced fracture of surgical neck of right humerus, initial encounter for closed fracture: Secondary | ICD-10-CM

## 2016-01-15 HISTORY — PX: ORIF HUMERUS FRACTURE: SHX2126

## 2016-01-15 LAB — GLUCOSE, CAPILLARY
GLUCOSE-CAPILLARY: 97 mg/dL (ref 65–99)
GLUCOSE-CAPILLARY: 130 mg/dL — AB (ref 65–99)
Glucose-Capillary: 106 mg/dL — ABNORMAL HIGH (ref 65–99)
Glucose-Capillary: 132 mg/dL — ABNORMAL HIGH (ref 65–99)
Glucose-Capillary: 146 mg/dL — ABNORMAL HIGH (ref 65–99)
Glucose-Capillary: 146 mg/dL — ABNORMAL HIGH (ref 65–99)
Glucose-Capillary: 89 mg/dL (ref 65–99)

## 2016-01-15 SURGERY — OPEN REDUCTION INTERNAL FIXATION (ORIF) PROXIMAL HUMERUS FRACTURE
Anesthesia: General | Site: Arm Upper | Laterality: Right

## 2016-01-15 MED ORDER — ACETAMINOPHEN 325 MG PO TABS: 650.0000 mg | ORAL_TABLET | Freq: Four times a day (QID) | ORAL | Status: DC | PRN

## 2016-01-15 MED ORDER — ONDANSETRON HCL 4 MG/2ML IJ SOLN: 4.0000 mg | Freq: Once | INTRAMUSCULAR | Status: DC | PRN

## 2016-01-15 MED ORDER — PHENOL 1.4 % MT LIQD: 1.0000 | OROMUCOSAL | Status: DC | PRN

## 2016-01-15 MED ORDER — PHENYLEPHRINE 40 MCG/ML (10ML) SYRINGE FOR IV PUSH (FOR BLOOD PRESSURE SUPPORT)
PREFILLED_SYRINGE | INTRAVENOUS | Status: AC
  Filled 2016-01-15: qty 10

## 2016-01-15 MED ORDER — 0.9 % SODIUM CHLORIDE (POUR BTL) OPTIME
TOPICAL | Status: DC | PRN
  Administered 2016-01-15: 1000 mL

## 2016-01-15 MED ORDER — SUCCINYLCHOLINE CHLORIDE 200 MG/10ML IV SOSY
PREFILLED_SYRINGE | INTRAVENOUS | Status: AC
  Filled 2016-01-15: qty 10

## 2016-01-15 MED ORDER — CEFAZOLIN SODIUM 1 G IJ SOLR
INTRAMUSCULAR | Status: DC | PRN
  Administered 2016-01-15: 2 g via INTRAMUSCULAR

## 2016-01-15 MED ORDER — ENOXAPARIN SODIUM 40 MG/0.4ML ~~LOC~~ SOLN
40.0000 mg | SUBCUTANEOUS | Status: DC
  Administered 2016-01-16 – 2016-01-18 (×3): 40 mg via SUBCUTANEOUS
  Filled 2016-01-15 (×3): qty 0.4

## 2016-01-15 MED ORDER — SUGAMMADEX SODIUM 500 MG/5ML IV SOLN
INTRAVENOUS | Status: AC
  Filled 2016-01-15: qty 5

## 2016-01-15 MED ORDER — LACTATED RINGERS IV SOLN
INTRAVENOUS | Status: DC | PRN
  Administered 2016-01-15 – 2016-01-20 (×4): via INTRAVENOUS

## 2016-01-15 MED ORDER — ROCURONIUM BROMIDE 10 MG/ML (PF) SYRINGE
PREFILLED_SYRINGE | INTRAVENOUS | Status: AC
  Filled 2016-01-15: qty 10

## 2016-01-15 MED ORDER — FENTANYL CITRATE (PF) 100 MCG/2ML IJ SOLN
INTRAMUSCULAR | Status: DC | PRN
  Administered 2016-01-15 – 2016-01-20 (×7): 50 ug via INTRAVENOUS
  Administered 2016-01-20: 100 ug via INTRAVENOUS
  Administered 2016-01-20: 50 ug via INTRAVENOUS

## 2016-01-15 MED ORDER — FENTANYL CITRATE (PF) 100 MCG/2ML IJ SOLN
INTRAMUSCULAR | Status: AC
  Filled 2016-01-15: qty 2

## 2016-01-15 MED ORDER — SODIUM CHLORIDE 0.9% FLUSH
10.0000 mL | INTRAVENOUS | Status: DC | PRN
  Administered 2016-01-17: 30 mL
  Administered 2016-01-17 – 2016-01-19 (×3): 10 mL
  Filled 2016-01-15 (×4): qty 40

## 2016-01-15 MED ORDER — ONDANSETRON HCL 4 MG PO TABS: 4.0000 mg | ORAL_TABLET | Freq: Four times a day (QID) | ORAL | Status: DC | PRN

## 2016-01-15 MED ORDER — SODIUM CHLORIDE 0.9 % IR SOLN
Status: DC | PRN
  Administered 2016-01-15 (×2): 3000 mL

## 2016-01-15 MED ORDER — LACTATED RINGERS IV SOLN: INTRAVENOUS | Status: DC | PRN

## 2016-01-15 MED ORDER — LIDOCAINE 2% (20 MG/ML) 5 ML SYRINGE
INTRAMUSCULAR | Status: AC
  Filled 2016-01-15: qty 5

## 2016-01-15 MED ORDER — ONDANSETRON HCL 4 MG/2ML IJ SOLN: 4.0000 mg | Freq: Four times a day (QID) | INTRAMUSCULAR | Status: DC | PRN

## 2016-01-15 MED ORDER — SODIUM CHLORIDE 0.9 % IV SOLN
INTRAVENOUS | Status: AC
  Administered 2016-01-15: 23:00:00 via INTRAVENOUS

## 2016-01-15 MED ORDER — PROPOFOL 10 MG/ML IV BOLUS
INTRAVENOUS | Status: DC | PRN
  Administered 2016-01-15: 20 mg via INTRAVENOUS
  Administered 2016-01-15: 10 mg via INTRAVENOUS
  Administered 2016-01-15: 170 mg via INTRAVENOUS
  Administered 2016-01-20: 140 mg via INTRAVENOUS

## 2016-01-15 MED ORDER — MIDAZOLAM HCL 2 MG/2ML IJ SOLN
INTRAMUSCULAR | Status: AC
  Filled 2016-01-15: qty 2

## 2016-01-15 MED ORDER — ONDANSETRON HCL 4 MG/2ML IJ SOLN
INTRAMUSCULAR | Status: AC
  Filled 2016-01-15: qty 2

## 2016-01-15 MED ORDER — PHENYLEPHRINE HCL 10 MG/ML IJ SOLN
INTRAMUSCULAR | Status: DC | PRN
  Administered 2016-01-15: 40 ug via INTRAVENOUS

## 2016-01-15 MED ORDER — FENTANYL CITRATE (PF) 100 MCG/2ML IJ SOLN
INTRAMUSCULAR | Status: AC
  Filled 2016-01-15: qty 4

## 2016-01-15 MED ORDER — ACETAMINOPHEN 650 MG RE SUPP: 650.0000 mg | Freq: Four times a day (QID) | RECTAL | Status: DC | PRN

## 2016-01-15 MED ORDER — METOCLOPRAMIDE HCL 5 MG PO TABS: 5.0000 mg | ORAL_TABLET | Freq: Three times a day (TID) | ORAL | Status: DC | PRN

## 2016-01-15 MED ORDER — ROCURONIUM BROMIDE 100 MG/10ML IV SOLN
INTRAVENOUS | Status: DC | PRN
  Administered 2016-01-15: 20 mg via INTRAVENOUS
  Administered 2016-01-15: 10 mg via INTRAVENOUS
  Administered 2016-01-15: 50 mg via INTRAVENOUS
  Administered 2016-01-20: 60 mg via INTRAVENOUS
  Administered 2016-01-20 (×2): 20 mg via INTRAVENOUS

## 2016-01-15 MED ORDER — SUGAMMADEX SODIUM 500 MG/5ML IV SOLN
INTRAVENOUS | Status: DC | PRN
  Administered 2016-01-15: 239.4 mg via INTRAVENOUS

## 2016-01-15 MED ORDER — PHENYLEPHRINE HCL 10 MG/ML IJ SOLN
INTRAMUSCULAR | Status: DC | PRN
  Administered 2016-01-15 – 2016-01-20 (×2): 10 ug/min via INTRAVENOUS

## 2016-01-15 MED ORDER — FENTANYL CITRATE (PF) 100 MCG/2ML IJ SOLN
25.0000 ug | INTRAMUSCULAR | Status: DC | PRN
  Administered 2016-01-15 (×2): 50 ug via INTRAVENOUS

## 2016-01-15 MED ORDER — MENTHOL 3 MG MT LOZG: 1.0000 | LOZENGE | OROMUCOSAL | Status: DC | PRN

## 2016-01-15 MED ORDER — PROPOFOL 10 MG/ML IV BOLUS
INTRAVENOUS | Status: AC
  Filled 2016-01-15: qty 20

## 2016-01-15 MED ORDER — SUCCINYLCHOLINE CHLORIDE 20 MG/ML IJ SOLN
INTRAMUSCULAR | Status: DC | PRN
  Administered 2016-01-15: 100 mg via INTRAVENOUS

## 2016-01-15 MED ORDER — ONDANSETRON HCL 4 MG/2ML IJ SOLN
INTRAMUSCULAR | Status: DC | PRN
  Administered 2016-01-15: 4 mg via INTRAVENOUS

## 2016-01-15 MED ORDER — LIDOCAINE HCL (CARDIAC) 20 MG/ML IV SOLN
INTRAVENOUS | Status: DC | PRN
  Administered 2016-01-15: 50 mg via INTRAVENOUS
  Administered 2016-01-20: 60 mg via INTRAVENOUS

## 2016-01-15 MED ORDER — CEFAZOLIN SODIUM-DEXTROSE 2-4 GM/100ML-% IV SOLN
2.0000 g | Freq: Four times a day (QID) | INTRAVENOUS | Status: AC
  Administered 2016-01-16 (×3): 2 g via INTRAVENOUS
  Filled 2016-01-15 (×3): qty 100

## 2016-01-15 MED ORDER — METOCLOPRAMIDE HCL 5 MG/ML IJ SOLN: 5.0000 mg | Freq: Three times a day (TID) | INTRAMUSCULAR | Status: DC | PRN

## 2016-01-15 SURGICAL SUPPLY — 88 items
APL SKNCLS STERI-STRIP NONHPOA (GAUZE/BANDAGES/DRESSINGS) ×1
BANDAGE ACE 4X5 VEL STRL LF (GAUZE/BANDAGES/DRESSINGS) IMPLANT
BANDAGE ACE 6X5 VEL STRL LF (GAUZE/BANDAGES/DRESSINGS) IMPLANT
BENZOIN TINCTURE PRP APPL 2/3 (GAUZE/BANDAGES/DRESSINGS) ×3 IMPLANT
BIT DRILL 3.2 (BIT) ×3
BIT DRILL 3.2XCALB NS DISP (BIT) IMPLANT
BIT DRILL CALIBRATED 2.7 (BIT) ×2 IMPLANT
BIT DRILL CALIBRATED 2.7MM (BIT) ×1
BIT DRL 3.2XCALB NS DISP (BIT) ×1
BNDG COHESIVE 4X5 TAN STRL (GAUZE/BANDAGES/DRESSINGS) ×3 IMPLANT
CLOSURE WOUND 1/2 X4 (GAUZE/BANDAGES/DRESSINGS) ×3
COVER SURGICAL LIGHT HANDLE (MISCELLANEOUS) ×3 IMPLANT
DRAIN PENROSE 1/2X12 LTX STRL (WOUND CARE) IMPLANT
DRAPE C-ARM 42X72 X-RAY (DRAPES) IMPLANT
DRAPE IMP U-DRAPE 54X76 (DRAPES) ×3 IMPLANT
DRAPE U-SHAPE 47X51 STRL (DRAPES) ×3 IMPLANT
DRSG AQUACEL AG ADV 3.5X14 (GAUZE/BANDAGES/DRESSINGS) ×2 IMPLANT
DRSG PAD ABDOMINAL 8X10 ST (GAUZE/BANDAGES/DRESSINGS) IMPLANT
DURAPREP 26ML APPLICATOR (WOUND CARE) ×3 IMPLANT
ELECT REM PT RETURN 9FT ADLT (ELECTROSURGICAL) ×3
ELECTRODE REM PT RTRN 9FT ADLT (ELECTROSURGICAL) ×1 IMPLANT
FACESHIELD WRAPAROUND (MASK) ×3 IMPLANT
FACESHIELD WRAPAROUND OR TEAM (MASK) ×1 IMPLANT
GAUZE SPONGE 4X4 12PLY STRL (GAUZE/BANDAGES/DRESSINGS) IMPLANT
GAUZE XEROFORM 5X9 LF (GAUZE/BANDAGES/DRESSINGS) IMPLANT
GLOVE BIOGEL PI IND STRL 8 (GLOVE) ×1 IMPLANT
GLOVE BIOGEL PI INDICATOR 8 (GLOVE) ×2
GLOVE SURG ORTHO 8.0 STRL STRW (GLOVE) ×3 IMPLANT
GOWN STRL REUS W/ TWL LRG LVL3 (GOWN DISPOSABLE) ×2 IMPLANT
GOWN STRL REUS W/ TWL XL LVL3 (GOWN DISPOSABLE) ×1 IMPLANT
GOWN STRL REUS W/TWL LRG LVL3 (GOWN DISPOSABLE) ×6
GOWN STRL REUS W/TWL XL LVL3 (GOWN DISPOSABLE) ×3
K-WIRE 2X5 SS THRDED S3 (WIRE) ×3
KIT BASIN OR (CUSTOM PROCEDURE TRAY) ×3 IMPLANT
KIT ROOM TURNOVER OR (KITS) ×3 IMPLANT
KWIRE 2X5 SS THRDED S3 (WIRE) IMPLANT
MANIFOLD NEPTUNE II (INSTRUMENTS) ×3 IMPLANT
NDL SUT 2 .5 CRC MAYO 1.732X (NEEDLE) IMPLANT
NEEDLE 21X1 OR PACK (NEEDLE) IMPLANT
NEEDLE MAYO TAPER (NEEDLE) ×3
NS IRRIG 1000ML POUR BTL (IV SOLUTION) ×3 IMPLANT
PACK SHOULDER (CUSTOM PROCEDURE TRAY) ×3 IMPLANT
PACK UNIVERSAL I (CUSTOM PROCEDURE TRAY) ×3 IMPLANT
PAD ARMBOARD 7.5X6 YLW CONV (MISCELLANEOUS) ×6 IMPLANT
PAD CAST 4YDX4 CTTN HI CHSV (CAST SUPPLIES) IMPLANT
PADDING CAST COTTON 4X4 STRL (CAST SUPPLIES)
PEG LOCKING 3.2X34 (Screw) ×3 IMPLANT
PEG LOCKING 3.2X36 (Screw) ×2 IMPLANT
PEG LOCKING 3.2X38 (Screw) ×2 IMPLANT
PEG LOCKING 3.2X42 (Screw) ×4 IMPLANT
PEG LOCKING 3.2X48 (Peg) ×2 IMPLANT
PEG LOCKING 3.2X50 (Screw) ×2 IMPLANT
PEG LOCKING 3.2X52 (Peg) ×3 IMPLANT
PENCIL BUTTON HOLSTER BLD 10FT (ELECTRODE) IMPLANT
PLATE PROX HUM LO R 7H 133 (Plate) ×2 IMPLANT
PUTTY DBM STAGRAFT PLUS 2CC (Putty) ×2 IMPLANT
RETRIEVER SUT LRG (INSTRUMENTS) ×2 IMPLANT
SCREW LOCK CORT STAR 3.5X20 (Screw) ×2 IMPLANT
SCREW LOCK CORT STAR 3.5X22 (Screw) ×2 IMPLANT
SCREW LP NL T15 3.5X22 (Screw) ×6 IMPLANT
SCREW LP NL T15 3.5X24 (Screw) ×4 IMPLANT
SET IRRIG Y TYPE TUR BLADDER L (SET/KITS/TRAYS/PACK) ×2 IMPLANT
SLEEVE MEASURING 3.2 (BIT) ×2 IMPLANT
SLING ARM IMMOBILIZER XL (CAST SUPPLIES) ×2 IMPLANT
SPONGE LAP 4X18 X RAY DECT (DISPOSABLE) ×6 IMPLANT
STAPLER VISISTAT 35W (STAPLE) IMPLANT
STOCKINETTE IMPERVIOUS 9X36 MD (GAUZE/BANDAGES/DRESSINGS) IMPLANT
STRIP CLOSURE SKIN 1/2X4 (GAUZE/BANDAGES/DRESSINGS) ×3 IMPLANT
SUCTION FRAZIER HANDLE 10FR (MISCELLANEOUS)
SUCTION TUBE FRAZIER 10FR DISP (MISCELLANEOUS) IMPLANT
SUT FIBERWIRE #2 38 REV NDL BL (SUTURE) ×9
SUT MNCRL AB 3-0 PS2 18 (SUTURE) ×6 IMPLANT
SUT SILK 3 0 (SUTURE) ×3
SUT SILK 3-0 18XBRD TIE 12 (SUTURE) IMPLANT
SUT VIC AB 0 CT1 27 (SUTURE) ×12
SUT VIC AB 0 CT1 27XBRD ANBCTR (SUTURE) ×4 IMPLANT
SUT VIC AB 1 CT1 27 (SUTURE) ×12
SUT VIC AB 1 CT1 27XBRD ANBCTR (SUTURE) IMPLANT
SUT VIC AB 2-0 CT1 27 (SUTURE) ×6
SUT VIC AB 2-0 CT1 TAPERPNT 27 (SUTURE) IMPLANT
SUT VIC AB 2-0 CTB1 (SUTURE) IMPLANT
SUTURE FIBERWR#2 38 REV NDL BL (SUTURE) IMPLANT
TOWEL OR 17X24 6PK STRL BLUE (TOWEL DISPOSABLE) ×3 IMPLANT
TOWEL OR 17X26 10 PK STRL BLUE (TOWEL DISPOSABLE) ×3 IMPLANT
TUBE CONNECTING 12'X1/4 (SUCTIONS)
TUBE CONNECTING 12X1/4 (SUCTIONS) IMPLANT
WATER STERILE IRR 1000ML POUR (IV SOLUTION) ×3 IMPLANT
YANKAUER SUCT BULB TIP NO VENT (SUCTIONS) IMPLANT

## 2016-01-15 NOTE — Progress Notes (Signed)
Plan for surgical fixation of proximal humerus fracture on right today Risks benefits discussed All ? answered

## 2016-01-15 NOTE — Anesthesia Preprocedure Evaluation (Signed)
Anesthesia Evaluation  Patient identified by MRN, date of birth, ID band Patient awake    Reviewed: Allergy & Precautions, NPO status , Patient's Chart, lab work & pertinent test results  Airway Mallampati: II  TM Distance: >3 FB Neck ROM: Full    Dental no notable dental hx.    Pulmonary neg pulmonary ROS,    Pulmonary exam normal breath sounds clear to auscultation       Cardiovascular hypertension, Normal cardiovascular exam Rhythm:Regular Rate:Normal     Neuro/Psych negative neurological ROS  negative psych ROS   GI/Hepatic negative GI ROS, Neg liver ROS, GERD  ,  Endo/Other  diabetesMorbid obesity  Renal/GU negative Renal ROS  negative genitourinary   Musculoskeletal negative musculoskeletal ROS (+)   Abdominal   Peds negative pediatric ROS (+)  Hematology negative hematology ROS (+)   Anesthesia Other Findings   Reproductive/Obstetrics negative OB ROS                             Anesthesia Physical Anesthesia Plan  ASA: III  Anesthesia Plan: General   Post-op Pain Management:    Induction: Intravenous  Airway Management Planned: Oral ETT  Additional Equipment:   Intra-op Plan:   Post-operative Plan: Extubation in OR  Informed Consent: I have reviewed the patients History and Physical, chart, labs and discussed the procedure including the risks, benefits and alternatives for the proposed anesthesia with the patient or authorized representative who has indicated his/her understanding and acceptance.   Dental advisory given  Plan Discussed with: CRNA and Surgeon  Anesthesia Plan Comments:         Anesthesia Quick Evaluation

## 2016-01-15 NOTE — Anesthesia Procedure Notes (Deleted)
Performed by: Adeyemi Hamad LEFFEW       

## 2016-01-15 NOTE — Brief Op Note (Signed)
01/12/2016 - 01/15/2016  9:37 PM  PATIENT:  Yesenia [name]  65 y.o. female  PRE-OPERATIVE DIAGNOSIS:  Bilateral Proximal Humerus Fx  POST-OPERATIVE DIAGNOSIS:  Bilateral Proximal Humerus Fx  PROCEDURE:  Procedure(s): OPEN REDUCTION INTERNAL FIXATION (ORIF) PROXIMAL HUMERUS FRACTURE right  SURGEON:  Surgeon(s): Meredith Pel, MD  ASSISTANT: Ky Barban  ANESTHESIA:   general  EBL: 150 ml    Total I/O In: 1500 [I.V.:1500] Out: 300 [Urine:150; Blood:150]  BLOOD ADMINISTERED: none  DRAINS: none   LOCAL MEDICATIONS USED:  none  SPECIMEN:  No Specimen  COUNTS:  YES  TOURNIQUET:  * No tourniquets in log *  DICTATION: .Other Dictation: Dictation Number [id number]  PLAN OF CARE: Admit to inpatient   PATIENT DISPOSITION:  PACU - hemodynamically stable

## 2016-01-15 NOTE — Progress Notes (Signed)
PROGRESS NOTE  Yesenia [name] [id number] DOB: 07-Mar-1951 DOA: 01/12/2016 PCP: Vic Blackbird, MD   LOS: 3 days   Brief Narrative: Yesenia [name] is a 65 y.o. female with medical history significant of DM, hypothyrdoism, remote h/o DVT/PE, HTN, fibromylagia, and GERD presenting for her 4th ED visit in as many days following a fall, was diagnosed with bilateral humerus fracture. Orthopedic surgery consulted, plan to be taken for operative repair on 10/11.  Assessment & Plan: Principal Problem:   Humerus fracture Active Problems:   GERD   Essential hypertension, benign   Hypothyroidism   Morbid obesity (HCC)   Fibromyalgia   Type II diabetes mellitus with neurological manifestations (HCC)   CKD (chronic kidney disease), stage II   Hyperlipemia   Anemia   Bilateral humerus fractures She has bilateral shoulder fracture with reduced dislocation - Given the circumstances, patient is a relative invalid - she cannot comb her hair, brush her teeth, wipe her bottom, transfer from bed to chair or even stand up - discussed with Dr. Marlou Sa, plan for operative repair on right shoulder tomorrow and potential left repair as well - patient not getting enough pain relief with dilaudid PCA, discontinued, place on dilaudid IV prn and percocet for breakthrough DVT prophylaxis per orthopedics  HTN - fair control, continue current regimen   DM - A1c in 2/17 was 6.4 (and this is true since 2015), so patient has good outpatient control Held oral medications, continue SSI   Hypothyroidism - TSH was elevated in 2/17 with normal T3 and free T4 - repeat TSH elevated at 4.8, normal free T4. No change in regimen, recheck TSH in 3-4 weeks as an outpatient  CKD II - Cr stable  GERD - Continue Dexilant and Zantac (formulary substitution)  HLD - Continue Zocor  Anemia - Stable/better than baseline, hemoglobin 11 Continue to follow   Fibromylagia - pain regimen as above for  #1   DVT prophylaxis: Lovenox Code Status: Full Family Communication: d/w son at bedside Disposition Plan: Anticipate discharge in one to 2 days pending on her progress  Consultants:   Orthopedic surgery, Dr. Marlou Sa   Procedures:   None   Antimicrobials:  None    Subjective:  complaining of B/L shoulder pain    Objective: Vitals:   01/14/16 1200 01/14/16 1300 01/14/16 2015 01/15/16 0400  BP:  (!) 158/77 136/69 (!) 149/67  Pulse:  78 85 70  Resp: 10 19 18 18   Temp:  98.2 F (36.8 C) 98.2 F (36.8 C) 98.4 F (36.9 C)  TempSrc:  Oral Oral Oral  SpO2: 97% 91% 90% 95%  Weight:      Height:        Intake/Output Summary (Last 24 hours) at 01/15/16 0738 Last data filed at 01/14/16 1700  Gross per 24 hour  Intake              360 ml  Output                0 ml  Net              360 ml   Filed Weights   01/12/16 1419 01/13/16 0049  Weight: 119.7 kg (264 lb) 119.7 kg (263 lb 14.3 oz)    Examination: Constitutional: NAD Vitals:   01/14/16 1200 01/14/16 1300 01/14/16 2015 01/15/16 0400  BP:  (!) 158/77 136/69 (!) 149/67  Pulse:  78 85 70  Resp: 10 19 18 18   Temp:  98.2 F (36.8  C) 98.2 F (36.8 C) 98.4 F (36.9 C)  TempSrc:  Oral Oral Oral  SpO2: 97% 91% 90% 95%  Weight:      Height:       Eyes: lids and conjunctivae normal ENMT: Mucous membranes are moist.  Respiratory: clear to auscultation bilaterally, no wheezing, no crackles.  Cardiovascular: Regular rate and rhythm, no murmurs / rubs / gallops. Abdomen: no tenderness. Bowel sounds positive.  Musculoskeletal: no clubbing / cyanosis.  Skin: no rashes, lesions, ulcers. No induration Neurologic: non focal   Data Reviewed: I have personally reviewed following labs and imaging studies  CBC:  Recent Labs Lab 01/11/16 1003 01/13/16 0409 01/14/16 0725  WBC 7.0 7.1 6.1  NEUTROABS 4.6  --   --   HGB 11.7* 10.6* 11.0*  HCT 37.1 34.9* 36.1  MCV 89.8 92.6 93.8  PLT 169 196 0000000   Basic Metabolic  Panel:  Recent Labs Lab 01/11/16 1003 01/13/16 0409 01/14/16 0432  NA 135 137 136  K 3.6 3.5 3.6  CL 102 101 100*  CO2 27 28 26   GLUCOSE 126* 103* 97  BUN 11 8 9   CREATININE 0.88 0.91 0.97  CALCIUM 9.2 9.0 9.2   GFR: Estimated Creatinine Clearance: 69.9 mL/min (by C-G formula based on SCr of 0.97 mg/dL). Liver Function Tests: No results for input(s): AST, ALT, ALKPHOS, BILITOT, PROT, ALBUMIN in the last 168 hours. No results for input(s): LIPASE, AMYLASE in the last 168 hours. No results for input(s): AMMONIA in the last 168 hours. Coagulation Profile: No results for input(s): INR, PROTIME in the last 168 hours. Cardiac Enzymes:  Recent Labs Lab 01/11/16 1003  TROPONINI <0.03   BNP (last 3 results) No results for input(s): PROBNP in the last 8760 hours. HbA1C: No results for input(s): HGBA1C in the last 72 hours. CBG:  Recent Labs Lab 01/14/16 1146 01/14/16 1629 01/14/16 1954 01/15/16 0017 01/15/16 0414  GLUCAP 102* 138* 118* 146* 132*   Lipid Profile: No results for input(s): CHOL, HDL, LDLCALC, TRIG, CHOLHDL, LDLDIRECT in the last 72 hours. Thyroid Function Tests:  Recent Labs  01/12/16 1003  TSH 4.859*  FREET4 0.78   Anemia Panel: No results for input(s): VITAMINB12, FOLATE, FERRITIN, TIBC, IRON, RETICCTPCT in the last 72 hours. Urine analysis:    Component Value Date/Time   COLORURINE YELLOW 01/12/2016 1830   APPEARANCEUR CLEAR 01/12/2016 1830   LABSPEC 1.025 01/12/2016 1830   PHURINE 6.0 01/12/2016 1830   GLUCOSEU NEGATIVE 01/12/2016 1830   HGBUR NEGATIVE 01/12/2016 1830   BILIRUBINUR SMALL (A) 01/12/2016 1830   KETONESUR TRACE (A) 01/12/2016 1830   PROTEINUR NEGATIVE 01/12/2016 1830   UROBILINOGEN 0.2 12/30/2012 1300   NITRITE NEGATIVE 01/12/2016 1830   LEUKOCYTESUR NEGATIVE 01/12/2016 1830   Sepsis Labs: Invalid input(s): PROCALCITONIN, LACTICIDVEN  Recent Results (from the past 240 hour(s))  MRSA PCR Screening     Status: None    Collection Time: 01/12/16 11:11 PM  Result Value Ref Range Status   MRSA by PCR NEGATIVE NEGATIVE Final    Comment:        The GeneXpert MRSA Assay (FDA approved for NASAL specimens only), is one component of a comprehensive MRSA colonization surveillance program. It is not intended to diagnose MRSA infection nor to guide or monitor treatment for MRSA infections.       Radiology Studies: Ct Humerus Left Wo Contrast  Result Date: 01/13/2016 CLINICAL DATA:  Left proximal humerus fracture/dislocation on 01/09/2016. EXAM: CT OF THE LEFT SHOULDER WITHOUT CONTRAST; CT  OF THE LEFT HUMERUS WITHOUT CONTRAST TECHNIQUE: Multidetector CT imaging was performed according to the standard protocol. Multiplanar CT image reconstructions were also generated. COMPARISON:  Radiographs dated 01/12/2016 and 01/09/2016 FINDINGS: CT SCAN OF THE LEFT SHOULDER: There is a displaced 3.5 by 2.7 cm comminuted fracture of the greater tuberosity of the proximal left humerus. This involves the insertions of supraspinous, infraspinatus, and teres minor tendons. Fragments are displaced posteriorly as well as anterosuperiorly, best seen on the sagittal images. Dislocation has been reduced. Subscapularis insertion appears to be intact. There is a tiny hairline nondisplaced fracture of the anterior inferior aspect of the glenoid best seen on image 51 of series 10. AC joint is normal. Scapula is normal. CT SCAN OF THE LEFT HUMERUS: Again noted is the comminuted avulsion fracture of the greater tuberosity of the proximal left humerus. Displaced fragments are seen in posteriorly and anteriorly superiorly as described above. The humeral shaft is intact. Distal humeral metaphysis is not included on the CT scan. IMPRESSION: 1. Comminuted avulsion fracture of the greater tuberosity of the proximal humerus involving the insertions of the supraspinous, infraspinatus and teres minor tendons. 2. Humeral shaft is intact. 3. Dislocation has been  reduced. 4. Tiny nondisplaced hairline fracture of the anterior inferior aspect of the glenoid. Electronically Signed   By: Lorriane Shire M.D.   On: 01/13/2016 13:54   Ct Shoulder Left Wo Contrast  Result Date: 01/13/2016 CLINICAL DATA:  Left proximal humerus fracture/dislocation on 01/09/2016. EXAM: CT OF THE LEFT SHOULDER WITHOUT CONTRAST; CT OF THE LEFT HUMERUS WITHOUT CONTRAST TECHNIQUE: Multidetector CT imaging was performed according to the standard protocol. Multiplanar CT image reconstructions were also generated. COMPARISON:  Radiographs dated 01/12/2016 and 01/09/2016 FINDINGS: CT SCAN OF THE LEFT SHOULDER: There is a displaced 3.5 by 2.7 cm comminuted fracture of the greater tuberosity of the proximal left humerus. This involves the insertions of supraspinous, infraspinatus, and teres minor tendons. Fragments are displaced posteriorly as well as anterosuperiorly, best seen on the sagittal images. Dislocation has been reduced. Subscapularis insertion appears to be intact. There is a tiny hairline nondisplaced fracture of the anterior inferior aspect of the glenoid best seen on image 51 of series 10. AC joint is normal. Scapula is normal. CT SCAN OF THE LEFT HUMERUS: Again noted is the comminuted avulsion fracture of the greater tuberosity of the proximal left humerus. Displaced fragments are seen in posteriorly and anteriorly superiorly as described above. The humeral shaft is intact. Distal humeral metaphysis is not included on the CT scan. IMPRESSION: 1. Comminuted avulsion fracture of the greater tuberosity of the proximal humerus involving the insertions of the supraspinous, infraspinatus and teres minor tendons. 2. Humeral shaft is intact. 3. Dislocation has been reduced. 4. Tiny nondisplaced hairline fracture of the anterior inferior aspect of the glenoid. Electronically Signed   By: Lorriane Shire M.D.   On: 01/13/2016 13:54     Scheduled Meds: . busPIRone  10 mg Oral TID  . docusate  sodium  100 mg Oral BID  . famotidine  20 mg Oral Daily  . hydrochlorothiazide  25 mg Oral Daily  . Influenza vac split quadrivalent PF  0.5 mL Intramuscular Tomorrow-1000  . insulin aspart  0-20 Units Subcutaneous Q4H  . levothyroxine  112 mcg Oral QAC breakfast  . linaclotide  145 mcg Oral QAC breakfast  . pantoprazole  40 mg Oral Daily  . potassium chloride  10 mEq Oral Daily  . pregabalin  100 mg Oral TID  . simvastatin  40 mg Oral QHS  . traZODone  100 mg Oral QHS   Continuous Infusions:      [facility] Pager [phone]  [phone]  If 7PM-7AM, please contact night-coverage [email or web address] Password [facility] 01/15/2016, 7:38 AM

## 2016-01-15 NOTE — Transfer of Care (Signed)
Immediate Anesthesia Transfer of Care Note  Patient: LESSIE KETCHEM  Procedure(s) Performed: Procedure(s): OPEN REDUCTION INTERNAL FIXATION (ORIF) PROXIMAL HUMERUS FRACTURE (Right)  Patient Location: PACU  Anesthesia Type:General  Level of Consciousness: awake, alert , oriented, patient cooperative and responds to stimulation  Airway & Oxygen Therapy: Patient Spontanous Breathing and Patient connected to nasal cannula oxygen  Post-op Assessment: Report given to RN, Post -op Vital signs reviewed and stable and Patient moving all extremities X 4  Post vital signs: Reviewed and stable  Last Vitals:  Vitals:   01/15/16 1300 01/15/16 2150  BP: (!) 145/69   Pulse: 77   Resp: 18   Temp: 36.8 C (P) 36.1 C    Last Pain:  Vitals:   01/15/16 2150  TempSrc:   PainSc: (P) 0-No pain      Patients Stated Pain Goal: 3 ([id number])  Complications: No apparent anesthesia complications

## 2016-01-15 NOTE — Anesthesia Procedure Notes (Signed)
Procedure Name: Intubation Date/Time: 01/15/2016 5:41 PM Performed by: Trixie Deis A Pre-anesthesia Checklist: Patient identified, Emergency Drugs available, Suction available and Patient being monitored Patient Re-evaluated:Patient Re-evaluated prior to inductionOxygen Delivery Method: Circle System Utilized Preoxygenation: Pre-oxygenation with 100% oxygen Intubation Type: IV induction, Rapid sequence and Cricoid Pressure applied Laryngoscope Size: Mac and 3 Grade View: Grade I Tube type: Oral Tube size: 7.0 mm Number of attempts: 1 Airway Equipment and Method: Stylet and Oral airway Placement Confirmation: ETT inserted through vocal cords under direct vision,  positive ETCO2 and breath sounds checked- equal and bilateral Secured at: 21 cm Tube secured with: Tape Dental Injury: Teeth and Oropharynx as per pre-operative assessment

## 2016-01-15 NOTE — Anesthesia Postprocedure Evaluation (Signed)
Anesthesia Post Note  Patient: Yesenia [name]  Procedure(s) Performed: Procedure(s) (LRB): OPEN REDUCTION INTERNAL FIXATION (ORIF) PROXIMAL HUMERUS FRACTURE (Right)  Patient location during evaluation: PACU Anesthesia Type: General Level of consciousness: awake and alert Pain management: pain level controlled Vital Signs Assessment: post-procedure vital signs reviewed and stable Respiratory status: spontaneous breathing, nonlabored ventilation, respiratory function stable and patient connected to nasal cannula oxygen Cardiovascular status: blood pressure returned to baseline and stable Postop Assessment: no signs of nausea or vomiting Anesthetic complications: no    Last Vitals:  Vitals:   01/15/16 2212 01/15/16 2213  BP: (!) 171/78 (!) 174/72  Pulse: 86 84  Resp: 14 11  Temp:  36.1 C    Last Pain:  Vitals:   01/15/16 2213  TempSrc:   PainSc: Asleep                 Reynaldo Rossman S

## 2016-01-16 ENCOUNTER — Encounter (HOSPITAL_COMMUNITY): Payer: Self-pay | Admitting: Orthopedic Surgery

## 2016-01-16 LAB — CBC
HCT: 30.2 % — ABNORMAL LOW (ref 36.0–46.0)
HCT: 33.2 % — ABNORMAL LOW (ref 36.0–46.0)
HEMOGLOBIN: 9.4 g/dL — AB (ref 12.0–15.0)
Hemoglobin: 10.3 g/dL — ABNORMAL LOW (ref 12.0–15.0)
MCH: 28.1 pg (ref 26.0–34.0)
MCH: 28.2 pg (ref 26.0–34.0)
MCHC: 31.1 g/dL (ref 30.0–36.0)
MCHC: 31 g/dL (ref 30.0–36.0)
MCV: 90.1 fL (ref 78.0–100.0)
MCV: 91 fL (ref 78.0–100.0)
PLATELETS: 214 10*3/uL (ref 150–400)
Platelets: 240 10*3/uL (ref 150–400)
RBC: 3.35 MIL/uL — AB (ref 3.87–5.11)
RBC: 3.65 MIL/uL — ABNORMAL LOW (ref 3.87–5.11)
RDW: 15.8 % — ABNORMAL HIGH (ref 11.5–15.5)
RDW: 15.6 % — AB (ref 11.5–15.5)
WBC: 7.2 10*3/uL (ref 4.0–10.5)
WBC: 7.4 10*3/uL (ref 4.0–10.5)

## 2016-01-16 LAB — GLUCOSE, CAPILLARY
GLUCOSE-CAPILLARY: 110 mg/dL — AB (ref 65–99)
Glucose-Capillary: 136 mg/dL — ABNORMAL HIGH (ref 65–99)
Glucose-Capillary: 108 mg/dL — ABNORMAL HIGH (ref 65–99)
Glucose-Capillary: 114 mg/dL — ABNORMAL HIGH (ref 65–99)
Glucose-Capillary: 125 mg/dL — ABNORMAL HIGH (ref 65–99)
Glucose-Capillary: 136 mg/dL — ABNORMAL HIGH (ref 65–99)

## 2016-01-16 LAB — COMPREHENSIVE METABOLIC PANEL
ALK PHOS: 50 U/L (ref 38–126)
ALT: 13 U/L — AB (ref 14–54)
AST: 38 U/L (ref 15–41)
Albumin: 2.5 g/dL — ABNORMAL LOW (ref 3.5–5.0)
Anion gap: 6 (ref 5–15)
BILIRUBIN TOTAL: 0.8 mg/dL (ref 0.3–1.2)
BUN: 9 mg/dL (ref 6–20)
CALCIUM: 8.6 mg/dL — AB (ref 8.9–10.3)
CO2: 32 mmol/L (ref 22–32)
CREATININE: 1.02 mg/dL — AB (ref 0.44–1.00)
Chloride: 99 mmol/L — ABNORMAL LOW (ref 101–111)
GFR, EST NON AFRICAN AMERICAN: 56 mL/min — AB (ref >60)
Glucose, Bld: 105 mg/dL — ABNORMAL HIGH (ref 65–99)
Potassium: 4 mmol/L (ref 3.5–5.1)
Sodium: 137 mmol/L (ref 135–145)
Total Protein: 4.8 g/dL — ABNORMAL LOW (ref 6.5–8.1)

## 2016-01-16 LAB — CREATININE, SERUM
CREATININE: 1.07 mg/dL — AB (ref 0.44–1.00)
GFR calc Af Amer: >60 mL/min (ref >60)
GFR, EST NON AFRICAN AMERICAN: 53 mL/min — AB (ref >60)

## 2016-01-16 MED ORDER — OXYCODONE HCL 5 MG PO TABS
10.0000 mg | ORAL_TABLET | ORAL | Status: DC | PRN
  Administered 2016-01-16 – 2016-01-21 (×12): 10 mg via ORAL
  Filled 2016-01-16 (×13): qty 2

## 2016-01-16 MED ORDER — INSULIN ASPART 100 UNIT/ML ~~LOC~~ SOLN
0.0000 [IU] | Freq: Three times a day (TID) | SUBCUTANEOUS | Status: DC
  Administered 2016-01-17 – 2016-01-18 (×2): 3 [IU] via SUBCUTANEOUS
  Administered 2016-01-18 – 2016-01-19 (×2): 4 [IU] via SUBCUTANEOUS

## 2016-01-16 MED ORDER — BISACODYL 10 MG RE SUPP: 10.0000 mg | Freq: Every day | RECTAL | Status: DC | PRN

## 2016-01-16 MED ORDER — METHOCARBAMOL 500 MG PO TABS
500.0000 mg | ORAL_TABLET | Freq: Four times a day (QID) | ORAL | Status: DC | PRN
  Administered 2016-01-16 – 2016-01-21 (×9): 500 mg via ORAL
  Filled 2016-01-16 (×9): qty 1

## 2016-01-16 MED ORDER — POLYETHYLENE GLYCOL 3350 17 G PO PACK
17.0000 g | PACK | Freq: Two times a day (BID) | ORAL | Status: DC
  Administered 2016-01-16 – 2016-01-21 (×7): 17 g via ORAL
  Filled 2016-01-16 (×9): qty 1

## 2016-01-16 NOTE — NC FL2 (Signed)
[place] MEDICAID FL2 LEVEL OF CARE SCREENING TOOL     IDENTIFICATION  Patient Name: Yesenia [name] Birthdate: [birth year] Sex: female Admission Date (Current Location): 01/12/2016  [facility] and [place] Number:  [employer] and Address:  [place]. [facility] [place] [address] [place], [place] [address]      Provider Number: [id number]  Attending Physician Name and Address:  Reyne Dumas, MD  Relative Name and Phone Number:       Current Level of Care: Hospital Recommended Level of Care: [place] Prior Approval Number:    Date Approved/Denied:   PASRR Number: Pending. In [place], last name is Publishing copy  Discharge Plan: SNF    Current Diagnoses: Patient Active Problem List   Diagnosis Date Noted  . Proximal humerus fracture 01/12/2016  . Humerus fracture 01/12/2016  . Fatty liver 09/04/2015  . Small bowel lesion   . Neurotic excoriations 05/14/2015  . Constipation 05/09/2015  . History of colonic polyps   . Diverticulosis of colon without hemorrhage   . Mucosal abnormality of stomach   . Mucosal abnormality of esophagus   . Reflux esophagitis   . Dysphagia   . Cellulitis of leg, left 01/03/2015  . Bug bites 01/03/2015  . FH: colon cancer 12/19/2014  . Anemia 12/19/2014  . Dry cough 06/22/2014  . Hyperlipemia 09/06/2013  . MDD (major depressive disorder) 09/06/2013  . Previous back surgery 11/04/2012  . Long term (current) use of anticoagulants 11/04/2012  . Anemia, iron deficiency 11/01/2012  . DVT (deep venous thrombosis) ([place])   . PE (pulmonary embolism)   . CKD (chronic kidney disease), stage II 05/17/2012  . Diabetic neuropathy ([place]) 03/18/2012  . Leg edema 01/01/2012  . Cough 10/28/2011  . Sleep apnea 10/28/2011  . Type II diabetes mellitus with neurological manifestations ([place]) 10/28/2011  . Fibromyalgia 10/20/2011  . Essential hypertension, benign 09/08/2011  . Hypothyroidism 09/08/2011  . Joint pain  09/08/2011  . DDD (degenerative disc disease), lumbar 09/08/2011  . Morbid obesity ([place]) 09/08/2011  . Anxiety 09/08/2011  . GERD 01/08/2010  . SPINAL STENOSIS, LUMBAR 09/16/2009    Orientation RESPIRATION BLADDER Height & Weight     Self, Situation  O2 (Nasal Canula 2 L) Continent, Indwelling catheter Weight: 263 lb 14.3 oz (119.7 kg) Height:  5\' 1"  (154.9 cm)  BEHAVIORAL SYMPTOMS/MOOD NEUROLOGICAL BOWEL NUTRITION STATUS   (None)  (None) Continent Diet (Carb modified)  AMBULATORY STATUS COMMUNICATION OF NEEDS Skin   Limited Assist Verbally Bruising, Surgical wounds                       Personal Care Assistance Level of Assistance  Total care       Total Care Assistance: Maximum assistance   Functional Limitations Info  Sight, Hearing, Speech Sight Info: Adequate Hearing Info: Adequate Speech Info: Adequate    SPECIAL CARE FACTORS FREQUENCY  Blood pressure, Diabetic urine testing, PT (By licensed PT), OT (By licensed OT)     PT Frequency: 5 x week OT Frequency: 5 x week            Contractures Contractures Info: Not present    Additional Factors Info  Code Status, Allergies, Psychotropic Code Status Info: Full Allergies Info: Morphine, Aspirin, Nalbuphine Psychotropic Info: Anxiety         Current Medications (01/16/2016):  This is the current hospital active medication list Current Facility-Administered Medications  Medication Dose Route Frequency Provider Last Rate Last Dose  .  0.9 %  sodium chloride infusion   Intravenous Continuous Meredith Pel, MD 75 mL/hr at 01/15/16 2237    . acetaminophen (TYLENOL) tablet 650 mg  650 mg Oral Q6H PRN Karmen Bongo, MD       Or  . acetaminophen (TYLENOL) suppository 650 mg  650 mg Rectal Q6H PRN Karmen Bongo, MD      . bisacodyl (DULCOLAX) suppository 10 mg  10 mg Rectal Daily PRN Reyne Dumas, MD      . busPIRone (BUSPAR) tablet 10 mg  10 mg Oral TID Karmen Bongo, MD   10 mg at 01/16/16 1000  .  ceFAZolin (ANCEF) IVPB 2g/100 mL premix  2 g Intravenous Q6H Meredith Pel, MD   2 g at 01/16/16 0559  . docusate sodium (COLACE) capsule 100 mg  100 mg Oral BID Karmen Bongo, MD   100 mg at 01/16/16 1000  . enoxaparin (LOVENOX) injection 40 mg  40 mg Subcutaneous Q24H Meredith Pel, MD      . famotidine (PEPCID) tablet 20 mg  20 mg Oral Daily Karmen Bongo, MD   20 mg at 01/16/16 1000  . hydrochlorothiazide (HYDRODIURIL) tablet 25 mg  25 mg Oral Daily Karmen Bongo, MD   25 mg at 01/16/16 1000  . HYDROmorphone (DILAUDID) injection 1 mg  1 mg Intravenous Q2H PRN Caren Griffins, MD   1 mg at 01/16/16 1104  . Influenza vac split quadrivalent PF (FLUARIX) injection 0.5 mL  0.5 mL Intramuscular Tomorrow-1000 Karmen Bongo, MD      . insulin aspart (novoLOG) injection 0-20 Units  0-20 Units Subcutaneous Q4H Karmen Bongo, MD   3 Units at 01/16/16 0119  . levothyroxine (SYNTHROID, LEVOTHROID) tablet 112 mcg  112 mcg Oral QAC breakfast Karmen Bongo, MD   112 mcg at 01/16/16 0800  . linaclotide (LINZESS) capsule 145 mcg  145 mcg Oral QAC breakfast Caren Griffins, MD   145 mcg at 01/16/16 0853  . LORazepam (ATIVAN) injection 0.5 mg  0.5 mg Intravenous Q5 Min x 2 PRN Caren Griffins, MD   0.5 mg at 01/13/16 1248  . menthol-cetylpyridinium (CEPACOL) lozenge 3 mg  1 lozenge Oral PRN Meredith Pel, MD       Or  . phenol (CHLORASEPTIC) mouth spray 1 spray  1 spray Mouth/Throat PRN Meredith Pel, MD      . metoCLOPramide (REGLAN) tablet 5-10 mg  5-10 mg Oral Q8H PRN Meredith Pel, MD       Or  . metoCLOPramide (REGLAN) injection 5-10 mg  5-10 mg Intravenous Q8H PRN Meredith Pel, MD      . ondansetron [facility]) tablet 4 mg  4 mg Oral Q6H PRN Karmen Bongo, MD       Or  . ondansetron [facility]) injection 4 mg  4 mg Intravenous Q6H PRN Karmen Bongo, MD   4 mg at 01/14/16 [id number]  . oxyCODONE (Oxy IR/ROXICODONE) immediate release tablet 10 mg  10 mg Oral Q4H PRN Reyne Dumas, MD      . pantoprazole (PROTONIX) EC tablet 40 mg  40 mg Oral Daily Karmen Bongo, MD   40 mg at 01/16/16 1000  . polyethylene glycol (MIRALAX / GLYCOLAX) packet 17 g  17 g Oral BID Reyne Dumas, MD      . potassium chloride (K-DUR,KLOR-CON) CR tablet 10 mEq  10 mEq Oral Daily Karmen Bongo, MD   10 mEq at 01/16/16 1000  . pregabalin (LYRICA) capsule 100 mg  100  mg Oral TID Karmen Bongo, MD   100 mg at 01/16/16 1000  . simvastatin (ZOCOR) tablet 40 mg  40 mg Oral QHS Karmen Bongo, MD   40 mg at 01/14/16 2206  . sodium chloride flush (NS) 0.9 % injection 10-40 mL  10-40 mL Intracatheter PRN Reyne Dumas, MD      . traZODone (DESYREL) tablet 100 mg  100 mg Oral QHS Karmen Bongo, MD   100 mg at 01/14/16 2206   Facility-Administered Medications Ordered in Other Encounters  Medication Dose Route Frequency Provider Last Rate Last Dose  . ceFAZolin (ANCEF) injection    Anesthesia Intra-op Leonor Liv, CRNA   2 g at 01/15/16 1748  . fentaNYL (SUBLIMAZE) injection    Anesthesia Intra-op Ariel Leffew Holtzman, CRNA   50 mcg at 01/15/16 2105  . lactated ringers infusion    Continuous PRN Leonor Liv, CRNA      . lidocaine (cardiac) 100 mg/103ml (XYLOCAINE) 20 MG/ML injection 2%    Anesthesia Intra-op Leonor Liv, CRNA   50 mg at 01/15/16 1738  . ondansetron (ZOFRAN) injection   Intravenous Anesthesia Intra-op Ariel Leffew Holtzman, CRNA   4 mg at 01/15/16 2057  . phenylephrine (NEO-SYNEPHRINE) 0.04 mg/mL in dextrose 5 % 250 mL infusion    Continuous PRN Ariel Leffew Holtzman, CRNA   Stopped at 01/15/16 1924  . phenylephrine (NEO-SYNEPHRINE) injection    Anesthesia Intra-op Leonor Liv, CRNA   40 mcg at 01/15/16 1812  . propofol (DIPRIVAN) 10 mg/mL bolus/IV push    Anesthesia Intra-op Leonor Liv, CRNA   10 mg at 01/15/16 2112  . rocuronium [facility]) injection    Anesthesia Intra-op Leonor Liv, CRNA   20 mg at 01/15/16 1911  . succinylcholine (ANECTINE) injection    Anesthesia Intra-op  Leonor Liv, CRNA   100 mg at 01/15/16 1738  . sugammadex sodium (BRIDION) injection   Intravenous Anesthesia Intra-op Ariel Leffew Holtzman, CRNA   239.4 mg at 01/15/16 2136     Discharge Medications: Please see discharge summary for a list of discharge medications.  Relevant Imaging Results:  Relevant Lab Results:   Additional Information SS#: [id number]  Candie Chroman, LCSW

## 2016-01-16 NOTE — Progress Notes (Signed)
Rept to Dr. Allyson Sabal. Pt states pain still not "tolerable". Orders received. Will continue to monitor.

## 2016-01-16 NOTE — Progress Notes (Signed)
PROGRESS NOTE  Yesenia [name] [id number] DOB: 25-Nov-1950 DOA: 01/12/2016 PCP: Vic Blackbird, MD   LOS: 4 days   Brief Narrative: Yesenia [name] is a 65 y.o. female with medical history significant of DM, hypothyrdoism, remote h/o DVT/PE, HTN, fibromylagia, and GERD presenting for her 4th ED visit in as many days following a fall, was diagnosed with bilateral humerus fracture. Orthopedic surgery consulted, plan to be taken for operative repair on 10/11.  Assessment & Plan: Principal Problem:   Humerus fracture Active Problems:   GERD   Essential hypertension, benign   Hypothyroidism   Morbid obesity (HCC)   Fibromyalgia   Type II diabetes mellitus with neurological manifestations (HCC)   CKD (chronic kidney disease), stage II   Hyperlipemia   Anemia   Proximal humerus fracture   Bilateral humerus fractures She has bilateral shoulder fracture with reduced dislocation - Given the circumstances, patient is a relative invalid - she cannot comb her hair, brush her teeth, wipe her bottom, transfer from bed to chair or even stand up - Patient seen by orthopedics Dr. Marlou Sa, status post OPEN REDUCTION INTERNAL FIXATION (ORIF) PROXIMAL HUMERUS FRACTURE 10/11 Left shoulder also may require surgery.  Comminuted fracture of the greater tuberosity and possibly part of the lesser tuberosity indicating significant rotator cuff pathology is noted on CT scan Patient may have surgery on left shoulder next week?   HTN - fair control, continue current regimen   DM - A1c in 2/17 was 6.4 (and this is true since 2015), so patient has good outpatient control Held oral medications, continue SSI   Hypothyroidism - TSH was elevated in 2/17 with normal T3 and free T4 - repeat TSH elevated at 4.8, normal free T4. No change in regimen, recheck TSH in 3-4 weeks as an outpatient  CKD II - Cr stable  GERD - Continue Dexilant and Zantac (formulary substitution)  HLD - Continue  Zocor  Anemia - Stable/better than baseline, hemoglobin 11>9.4, After Surgery Continue to follow   Fibromylagia - pain regimen as above for #1   DVT prophylaxis: Lovenox Code Status: Full Family Communication: d/w son at bedside Disposition Plan: Disposition as per orthopedic surgery, snf   Consultants:   Orthopedic surgery, Dr. Marlou Sa   Procedures:   None   Antimicrobials:  None    Subjective: Pain uncontrolled , constipated     Objective: Vitals:   01/15/16 2212 01/15/16 2213 01/15/16 2230 01/16/16 0448  BP: (!) 171/78 (!) 174/72 (!) 175/66 131/64  Pulse: 86 84 86 78  Resp: 14 11 16 16   Temp:  97 F (36.1 C) 98.4 F (36.9 C) 98.4 F (36.9 C)  TempSrc:   Oral Oral  SpO2: 100% 100% 100% 96%  Weight:      Height:        Intake/Output Summary (Last 24 hours) at 01/16/16 0824 Last data filed at 01/16/16 0650  Gross per 24 hour  Intake             2580 ml  Output             1600 ml  Net              980 ml   Filed Weights   01/12/16 1419 01/13/16 0049  Weight: 119.7 kg (264 lb) 119.7 kg (263 lb 14.3 oz)    Examination: Constitutional: NAD Vitals:   01/15/16 2212 01/15/16 2213 01/15/16 2230 01/16/16 0448  BP: (!) 171/78 (!) 174/72 (!) 175/66 131/64  Pulse: 86  84 86 78  Resp: 14 11 16 16   Temp:  97 F (36.1 C) 98.4 F (36.9 C) 98.4 F (36.9 C)  TempSrc:   Oral Oral  SpO2: 100% 100% 100% 96%  Weight:      Height:       Eyes: lids and conjunctivae normal ENMT: Mucous membranes are moist.  Respiratory: clear to auscultation bilaterally, no wheezing, no crackles.  Cardiovascular: Regular rate and rhythm, no murmurs / rubs / gallops. Abdomen: no tenderness. Bowel sounds positive.  Musculoskeletal: no clubbing / cyanosis.  Skin: no rashes, lesions, ulcers. No induration Neurologic: non focal   Data Reviewed: I have personally reviewed following labs and imaging studies  CBC:  Recent Labs Lab 01/11/16 1003 01/13/16 0409 01/14/16 0725  01/15/16 0001 01/16/16 0415  WBC 7.0 7.1 6.1 7.2 7.4  NEUTROABS 4.6  --   --   --   --   HGB 11.7* 10.6* 11.0* 10.3* 9.4*  HCT 37.1 34.9* 36.1 33.2* 30.2*  MCV 89.8 92.6 93.8 91.0 90.1  PLT 169 196 195 214 [id number]   Basic Metabolic Panel:  Recent Labs Lab 01/11/16 1003 01/13/16 0409 01/14/16 0432 01/15/16 0001 01/16/16 0415  NA 135 137 136  --  137  K 3.6 3.5 3.6  --  4.0  CL 102 101 100*  --  99*  CO2 27 28 26   --  32  GLUCOSE 126* 103* 97  --  105*  BUN 11 8 9   --  9  CREATININE 0.88 0.91 0.97 1.07* 1.02*  CALCIUM 9.2 9.0 9.2  --  8.6*   GFR: Estimated Creatinine Clearance: 66.5 mL/min (by C-G formula based on SCr of 1.02 mg/dL (H)). Liver Function Tests:  Recent Labs Lab 01/16/16 0415  AST 38  ALT 13*  ALKPHOS 50  BILITOT 0.8  PROT 4.8*  ALBUMIN 2.5*   No results for input(s): LIPASE, AMYLASE in the last 168 hours. No results for input(s): AMMONIA in the last 168 hours. Coagulation Profile: No results for input(s): INR, PROTIME in the last 168 hours. Cardiac Enzymes:  Recent Labs Lab 01/11/16 1003  TROPONINI <0.03   BNP (last 3 results) No results for input(s): PROBNP in the last 8760 hours. HbA1C: No results for input(s): HGBA1C in the last 72 hours. CBG:  Recent Labs Lab 01/15/16 2151 01/15/16 2228 01/16/16 0113 01/16/16 0451 01/16/16 0743  GLUCAP 146* 130* 136* 108* 114*   Lipid Profile: No results for input(s): CHOL, HDL, LDLCALC, TRIG, CHOLHDL, LDLDIRECT in the last 72 hours. Thyroid Function Tests: No results for input(s): TSH, T4TOTAL, FREET4, T3FREE, THYROIDAB in the last 72 hours. Anemia Panel: No results for input(s): VITAMINB12, FOLATE, FERRITIN, TIBC, IRON, RETICCTPCT in the last 72 hours. Urine analysis:    Component Value Date/Time   COLORURINE YELLOW 01/12/2016 1830   APPEARANCEUR CLEAR 01/12/2016 1830   LABSPEC 1.025 01/12/2016 1830   PHURINE 6.0 01/12/2016 1830   GLUCOSEU NEGATIVE 01/12/2016 1830   HGBUR NEGATIVE  01/12/2016 1830   BILIRUBINUR SMALL (A) 01/12/2016 1830   KETONESUR TRACE (A) 01/12/2016 1830   PROTEINUR NEGATIVE 01/12/2016 1830   UROBILINOGEN 0.2 12/30/2012 1300   NITRITE NEGATIVE 01/12/2016 1830   LEUKOCYTESUR NEGATIVE 01/12/2016 1830   Sepsis Labs: Invalid input(s): PROCALCITONIN, LACTICIDVEN  Recent Results (from the past 240 hour(s))  MRSA PCR Screening     Status: None   Collection Time: 01/12/16 11:11 PM  Result Value Ref Range Status   MRSA by PCR NEGATIVE NEGATIVE Final  Comment:        The GeneXpert MRSA Assay (FDA approved for NASAL specimens only), is one component of a comprehensive MRSA colonization surveillance program. It is not intended to diagnose MRSA infection nor to guide or monitor treatment for MRSA infections.       Radiology Studies: Dg Chest Port 1 View  Result Date: 01/16/2016 CLINICAL DATA:  Poor venous access.  Humerus shaft fracture. EXAM: PORTABLE CHEST 1 VIEW COMPARISON:  Chest CT 01/11/2016 FINDINGS: Tip of the left internal jugular central venous catheter projects over the region of the proximal SVC. There is no pneumothorax. Heart size and mediastinal contours are unchanged with mild cardiomegaly. Lung volumes are low. Scattered atelectasis. Possible vascular congestion versus bronchovascular crowding. No pleural effusion. Postsurgical change in the right proximal humerus. IMPRESSION: Tip of the left central line projects over the proximal SVC. No pneumothorax. Cardiomegaly. Possible vascular congestion versus bronchovascular crowding secondary to low lung volumes. Electronically Signed   By: Jeb Levering M.D.   On: 01/16/2016 02:32   Dg Shoulder Right Port  Result Date: 01/16/2016 CLINICAL DATA:  Proximal humerus fracture, postop. EXAM: PORTABLE RIGHT SHOULDER COMPARISON:  Preoperative exam 01/12/2016 FINDINGS: Lateral plate and multi screw fixation of comminuted proximal humerus fracture. Fracture is in improved alignment.  Acromioclavicular and glenohumeral alignment maintained. IMPRESSION: Plate and screw fixation of right proximal humerus fracture, improved in alignment. Electronically Signed   By: Jeb Levering M.D.   On: 01/16/2016 02:30   Dg Humerus Right  Result Date: 01/16/2016 CLINICAL DATA:  ORIF right humerus fracture. EXAM: DG C-ARM GT 120 MIN; RIGHT HUMERUS - 2+ VIEW FLUOROSCOPY TIME:  Fluoroscopy Time:  59 seconds Radiation Exposure Index (if provided by the fluoroscopic device): Not provided Number of Acquired Spot Images: 4 COMPARISON:  Radiographs 01/12/2016 FINDINGS: Lateral plate and multi screw fixation of right proximal humerus fracture. Fracture is in improved alignment compared to preoperative exam. Fluoroscopy time reported 59 seconds. IMPRESSION: Plate and screw fixation of right proximal humerus fracture, in improved alignment. Electronically Signed   By: Jeb Levering M.D.   On: 01/16/2016 00:55   Dg C-arm Gt 120 Min  Result Date: 01/16/2016 CLINICAL DATA:  ORIF right humerus fracture. EXAM: DG C-ARM GT 120 MIN; RIGHT HUMERUS - 2+ VIEW FLUOROSCOPY TIME:  Fluoroscopy Time:  59 seconds Radiation Exposure Index (if provided by the fluoroscopic device): Not provided Number of Acquired Spot Images: 4 COMPARISON:  Radiographs 01/12/2016 FINDINGS: Lateral plate and multi screw fixation of right proximal humerus fracture. Fracture is in improved alignment compared to preoperative exam. Fluoroscopy time reported 59 seconds. IMPRESSION: Plate and screw fixation of right proximal humerus fracture, in improved alignment. Electronically Signed   By: Jeb Levering M.D.   On: 01/16/2016 00:55     Scheduled Meds: . busPIRone  10 mg Oral TID  .  ceFAZolin (ANCEF) IV  2 g Intravenous Q6H  . docusate sodium  100 mg Oral BID  . enoxaparin (LOVENOX) injection  40 mg Subcutaneous Q24H  . famotidine  20 mg Oral Daily  . fentaNYL      . hydrochlorothiazide  25 mg Oral Daily  . Influenza vac split  quadrivalent PF  0.5 mL Intramuscular Tomorrow-1000  . insulin aspart  0-20 Units Subcutaneous Q4H  . levothyroxine  112 mcg Oral QAC breakfast  . linaclotide  145 mcg Oral QAC breakfast  . pantoprazole  40 mg Oral Daily  . potassium chloride  10 mEq Oral Daily  . pregabalin  100  mg Oral TID  . simvastatin  40 mg Oral QHS  . traZODone  100 mg Oral QHS   Continuous Infusions: . sodium chloride 75 mL/hr at 01/15/16 2237       [facility] Pager [phone]  [phone]  If 7PM-7AM, please contact night-coverage [email or web address] Password [email or web address] 01/16/2016, 8:24 AM

## 2016-01-16 NOTE — Evaluation (Signed)
Physical Therapy Evaluation Patient Details Name: Yesenia [name] MRN: [id number] DOB: 1950/05/26 Today's Date: 01/16/2016   History of Present Illness  pt presents afters falls sustaining Bil Humeral fxs and is now s/p ORIF of R humeral fx and awaiting further treatment of L humeral fx.  pt with hx of DM, HTN, DVT, and Fibromyalgia.    Clinical Impression  Pt very motivated and eager for mobility OOB.  Pt does need cues not to use L UE during mobility due to decreased pain in L UE.  Feel pt is going to require SNF level of care at D/C to maximize independence and decrease overall burden of care.  Will continue to follow.      Follow Up Recommendations SNF    Equipment Recommendations  None recommended by PT    Recommendations for Other Services       Precautions / Restrictions Precautions Precautions: Fall Required Braces or Orthoses: Sling Restrictions Weight Bearing Restrictions: Yes RUE Weight Bearing: Non weight bearing LUE Weight Bearing: Non weight bearing      Mobility  Bed Mobility Overal bed mobility: Needs Assistance;+2 for physical assistance Bed Mobility: Supine to Sit     Supine to sit: Mod assist;+2 for physical assistance;HOB elevated     General bed mobility comments: pt oes attempt to A with bringing LEs to EOB and trunk up to sitting, but unable to use either UE to A.  pt did need cues to not use L UE, btu did well maintaining R UE in sling.    Transfers Overall transfer level: Needs assistance Equipment used: None Transfers: Sit to/from Stand Sit to Stand: Mod assist;Min assist;+2 physical assistance         General transfer comment: pt ModA x2 to stand from bed, but MinA from 3-in-1.  pt needs cues to touch LEs to sitting surface and not use L UE to reach back to sitting surface.  pt demonstrates good anterior weight shift over BOS with coming to standing.    Ambulation/Gait Ambulation/Gait assistance: Min assist Ambulation Distance (Feet):  10 Feet (x2) Assistive device: None Gait Pattern/deviations: Step-through pattern;Decreased stride length     General Gait Details: pt moves very slowly and uses her feet to feel the threshold of the bathroom door while ambulating to/from 3-in-1.    Stairs            Wheelchair Mobility    Modified Rankin (Stroke Patients Only)       Balance Overall balance assessment: Needs assistance;History of Falls Sitting-balance support: No upper extremity supported;Feet supported Sitting balance-Leahy Scale: Good     Standing balance support: No upper extremity supported;During functional activity Standing balance-Leahy Scale: Fair Standing balance comment: pt needs A for any balance challenges.                               Pertinent Vitals/Pain Pain Assessment: 0-10 Pain Score: 8  Pain Location: R UE worse than L UE.   Pain Descriptors / Indicators: Aching;Grimacing;Guarding;Tightness Pain Intervention(s): Monitored during session;Premedicated before session;Repositioned;Ice applied    Home Living Family/patient expects to be discharged to:: Skilled nursing facility Living Arrangements: Alone               Additional Comments: pt indicates her son lives near by, but that he has MS and is unable to help if he has a flare-up.      Prior Function Level of Independence: Independent  Hand Dominance        Extremity/Trunk Assessment   Upper Extremity Assessment: Defer to OT evaluation           Lower Extremity Assessment: Generalized weakness      Cervical / Trunk Assessment: Kyphotic  Communication   Communication: No difficulties  Cognition Arousal/Alertness: Awake/alert Behavior During Therapy: WFL for tasks assessed/performed Overall Cognitive Status: Within Functional Limits for tasks assessed                      General Comments General comments (skin integrity, edema, etc.): pt ed on continued use of Bil  hands and performing ROM to hands and wrists.      Exercises     Assessment/Plan    PT Assessment Patient needs continued PT services  PT Problem List Decreased strength;Decreased activity tolerance;Decreased balance;Decreased mobility;Decreased knowledge of use of DME;Obesity;Pain          PT Treatment Interventions DME instruction;Gait training;Functional mobility training;Therapeutic activities;Therapeutic exercise;Balance training;Patient/family education    PT Goals (Current goals can be found in the Care Plan section)  Acute Rehab PT Goals Patient Stated Goal: Home ASAP PT Goal Formulation: With patient Time For Goal Achievement: 01/30/16 Potential to Achieve Goals: Good    Frequency Min 3X/week   Barriers to discharge Decreased caregiver support      Co-evaluation               End of Session Equipment Utilized During Treatment: Gait belt Activity Tolerance: Patient limited by pain Patient left: in chair;with call bell/phone within reach Nurse Communication: Mobility status         Time: [id number] PT Time Calculation (min) (ACUTE ONLY): 33 min   Charges:   PT Evaluation $PT Eval Moderate Complexity: 1 Procedure PT Treatments $Gait Training: 8-22 mins   PT G CodesCatarina Hartshorn, [place] 01/16/2016, 10:05 AM

## 2016-01-16 NOTE — Progress Notes (Signed)
Rept to Dr. Allyson Sabal. Pt repts that pain is not being well controlled on current pain regimen. MD making changes to current pain regimen. Will continue to monitor.

## 2016-01-16 NOTE — Progress Notes (Addendum)
Patient stable Resting in bed Right arm dressing intact radial pulse intact EPL FPL interosseous biceps and triceps fire deltoid difficult to assess due to pain Postop radiographs look good Plan for left shoulder is in limbo.  She needs a few days get over this surgery.  Could consider operative fixation next week but we will see how she does over the weekend

## 2016-01-16 NOTE — Evaluation (Signed)
Occupational Therapy Evaluation Patient Details Name: Yesenia [name] MRN: [id number] DOB: [birth year] Today's Date: 01/16/2016    History of Present Illness pt presents afters falls sustaining Bil Humeral fxs and is now s/p ORIF of R humeral fx and awaiting further treatment of L humeral fx.  pt with hx of DM, HTN, DVT, and Fibromyalgia.     Clinical Impression   Pt admitted with the above diagnoses and presents with below problem list. Pt will benefit from continued acute OT to address the below listed deficits and maximize independence with basic ADLs prior to d/c to venue below. PTA pt was independent with ADLs. Pt is currently total A for UB ADLs, +2 mod to max A with LB/OOB ADLs. Session details below. Of note, pt with minimal R elbow A/AROM, increased pain with minimal attempts at elbow extension.      Follow Up Recommendations  SNF    Equipment Recommendations  Other (comment) (defer to next venue)    Recommendations for Other Services       Precautions / Restrictions Precautions: no ROM R shoulder per MD order; also advised no ROM L shoulder  Precautions: Fall Required Braces or Orthoses: Sling Restrictions Weight Bearing Restrictions: Yes RUE Weight Bearing: Non weight bearing LUE Weight Bearing: Non weight bearing      Mobility Bed Mobility Overal bed mobility: Needs Assistance;+2 for physical assistance Bed Mobility: Supine to Sit     Supine to sit: Mod assist;+2 for physical assistance;HOB elevated     General bed mobility comments: up in chair  Transfers Overall transfer level: Needs assistance Equipment used: None Transfers: Sit to/from Stand Sit to Stand: Mod assist;Min assist;+2 physical assistance         General transfer comment: not attempted, min-mod A +2 per PT note    Balance Overall balance assessment: Needs assistance;History of Falls Sitting-balance support: No upper extremity supported;Feet supported Sitting balance-Leahy  Scale: Good     Standing balance support: No upper extremity supported;During functional activity Standing balance-Leahy Scale: Fair Standing balance comment: pt needs A for any balance challenges.                              ADL Overall ADL's : Needs assistance/impaired Eating/Feeding: Total assistance;Sitting   Grooming: Total assistance;Sitting   Upper Body Bathing: Total assistance;Sitting   Lower Body Bathing: Maximal assistance;+2 for physical assistance;Total assistance   Upper Body Dressing : Total assistance;Sitting   Lower Body Dressing: Maximal assistance;Total assistance;+2 for physical assistance   Toilet Transfer: Moderate assistance;+2 for physical assistance;Stand-pivot;Ambulation;BSC   Toileting- Clothing Manipulation and Hygiene: Sit to/from stand;Maximal assistance;+2 for physical assistance   Tub/ Shower Transfer: Moderate assistance;+2 for physical assistance;Stand-pivot;Ambulation;3 in 1     General ADL Comments: Chair side eval. OOB ADLs level of assist set using clinical judgement and chart review.  Pt NWB BUE and no ROM B shoulders impacting level of assist with ADLs.      Vision     Perception     Praxis      Pertinent Vitals/Pain Pain Assessment: 0-10 Pain Score: 8  Pain Location: RUE worse than LUE Pain Descriptors / Indicators: Aching;Grimacing;Guarding;Tightness Pain Intervention(s): Limited activity within patient's tolerance;Monitored during session;Repositioned;Premedicated before session;Ice applied     Hand Dominance Right   Extremity/Trunk Assessment Upper Extremity Assessment Upper Extremity Assessment: RUE deficits/detail;LUE deficits/detail RUE Deficits / Details: s/p OPEN REDUCTION INTERNAL FIXATION (ORIF) PROXIMAL HUMERUS FRACTURE right. Currently A/AROM in  R elbow very limited and painful. Pt reports she has not moved the elbow in the past week. Painful wrist AROM as well.  RUE: Unable to fully assess due to  pain;Unable to fully assess due to immobilization LUE Deficits / Details: sustained L humeral fx as well. Pt awaiting treatment of LUE fx. Advised no ROM of L shoulder. NWB LUE. LUE: Unable to fully assess due to pain;Unable to fully assess due to immobilization   Lower Extremity Assessment Lower Extremity Assessment: Defer to PT evaluation;Generalized weakness   Cervical / Trunk Assessment Cervical / Trunk Assessment: Kyphotic   Communication Communication Communication: No difficulties   Cognition Arousal/Alertness: Awake/alert Behavior During Therapy: WFL for tasks assessed/performed Overall Cognitive Status: Within Functional Limits for tasks assessed                     General Comments    Educated on importance of ROM exercises to elbow/wrist/hands of BUE. Pt with O2 at 90 on RA (was partially reclined in chair at the time). [place] reapplied.    Exercises Exercises: Other exercises Other Exercises Other Exercises: B elbow/wrist/hand ROM exercises, limited toleration of elbow extension/flexion. Pt reporting increased pain with minimal AAROM R elbow. Pt reports she has not moved R elbow in a week. Pt also reporting pain with R wrist ROM. Elbow/wrist/hand AROM exercises on LUE, tolerated well.   Shoulder Instructions      Home Living Family/patient expects to be discharged to:: Skilled nursing facility Living Arrangements: Alone                               Additional Comments: pt indicates her son lives near by, but that he has MS and is unable to help if he has a flare-up.        Prior Functioning/Environment Level of Independence: Independent                 OT Problem List: Decreased activity tolerance;Decreased range of motion;Impaired balance (sitting and/or standing);Decreased coordination;Decreased knowledge of use of DME or AE;Decreased knowledge of precautions;Obesity;Pain;Impaired UE functional use   OT Treatment/Interventions:  Self-care/ADL training;Therapeutic exercise;DME and/or AE instruction;Therapeutic activities;Patient/family education;Balance training    OT Goals(Current goals can be found in the care plan section) Acute Rehab OT Goals Patient Stated Goal: Home ASAP OT Goal Formulation: With patient Time For Goal Achievement: 01/23/16 Potential to Achieve Goals: Good ADL Goals Pt Will Perform Lower Body Bathing: with min assist;sit to/from stand (+2) Pt Will Perform Lower Body Dressing: with min assist;sit to/from stand (+2) Pt Will Transfer to Toilet: with min assist;with +2 assist;ambulating Pt Will Perform Tub/Shower Transfer: with min assist;ambulating;3 in 1 (+2) Pt/caregiver will Perform Home Exercise Program: Right Upper extremity;With written HEP provided;With minimal assist (elbow, wrist, and hand) Additional ADL Goal #1: Pt will complete bed mobility with max +1 assist to prepare for OOB ADLs.   OT Frequency: Min 2X/week   Barriers to D/C:            Co-evaluation              End of Session Equipment Utilized During Treatment: Oxygen;Other (comment) (reapplied [place] halfway through session) Nurse Communication: Other (comment) (8/10 pain, limited R elbow ROM, O2 90 on RA so reapplied [place])  Activity Tolerance: Patient limited by pain Patient left: in chair;with call bell/phone within reach   Time: [id number] OT Time Calculation (min): 26 min Charges:  OT General  Charges $OT Visit: 1 Procedure OT Evaluation $OT Eval Low Complexity: 1 Procedure OT Treatments $Therapeutic Exercise: 8-22 mins G-Codes:    Hortencia Pilar 2016-02-08, 11:18 AM

## 2016-01-16 NOTE — Care Management Important Message (Signed)
Important Message  Patient Details  Name: Yesenia [name] MRN: [id number] Date of Birth: June 05, 1950   Medicare Important Message Given:  Yes    Nathen May 01/16/2016, 9:50 AM

## 2016-01-16 NOTE — Clinical Social Work Note (Addendum)
Patient not fully oriented. CSW called and left voicemail for patient's son with contact information. Will assess and discuss SNF placement. Per report, first preference is [facility].  Dayton Scrape, [place] [phone]  2:38 pm CSW tried calling patient's son again. Did not answer and did not leave another message.  Dayton Scrape, [place]

## 2016-01-16 NOTE — Care Management Note (Addendum)
Case Management Note  Patient Details  Name: Yesenia [name] MRN: [id number] Date of Birth: 02/21/1951  Subjective/Objective:   Bilateral Humerus Fracture, ORIF right humerus fx   Action/Plan: Discharge Planning: Per [facility] HH note, did not accept referral for [facility]. Waiting PT/OT recommendations. SNF vs Home. CSW following for SNF -rehab placement.   PCP- Vic Blackbird F MD  Expected Discharge Date:    01/16/2016           Expected Discharge Plan:  [place]  In-House Referral:  Clinical Social Worker  Discharge planning Services  Care Management  Post Acute Care Choice:  NA Choice offered to:  NA  Status of Service:  complete  If discussed at Long Length of Stay Meetings, dates discussed:    Additional Comments:  Erenest Rasher, RN 01/16/2016, 9:46 AM

## 2016-01-17 LAB — GLUCOSE, CAPILLARY
GLUCOSE-CAPILLARY: 104 mg/dL — AB (ref 65–99)
Glucose-Capillary: 121 mg/dL — ABNORMAL HIGH (ref 65–99)
Glucose-Capillary: 134 mg/dL — ABNORMAL HIGH (ref 65–99)
Glucose-Capillary: 79 mg/dL (ref 65–99)

## 2016-01-17 LAB — CBC
HEMATOCRIT: 32.4 % — AB (ref 36.0–46.0)
Hemoglobin: 10.1 g/dL — ABNORMAL LOW (ref 12.0–15.0)
MCH: 28.7 pg (ref 26.0–34.0)
MCHC: 31.2 g/dL (ref 30.0–36.0)
MCV: 92 fL (ref 78.0–100.0)
PLATELETS: 222 10*3/uL (ref 150–400)
RBC: 3.52 MIL/uL — ABNORMAL LOW (ref 3.87–5.11)
RDW: 16.2 % — AB (ref 11.5–15.5)
WBC: 7 10*3/uL (ref 4.0–10.5)

## 2016-01-17 NOTE — Progress Notes (Signed)
PROGRESS NOTE  Yesenia [name] [id number] DOB: 05-02-1950 DOA: 01/12/2016 PCP: Vic Blackbird, MD   LOS: 5 days   Brief Narrative: Yesenia [name] is a 65 y.o. female with medical history significant of DM, hypothyrdoism, remote h/o DVT/PE, HTN, fibromylagia, and GERD presenting for her 4th ED visit in as many days following a fall, was diagnosed with bilateral humerus fracture. Orthopedic surgery consulted, s/p operative repair on 10/11.  Assessment & Plan: Principal Problem:   Humerus fracture Active Problems:   GERD   Essential hypertension, benign   Hypothyroidism   Morbid obesity (HCC)   Fibromyalgia   Type II diabetes mellitus with neurological manifestations (HCC)   CKD (chronic kidney disease), stage II   Hyperlipemia   Anemia   Proximal humerus fracture   Bilateral humerus fractures She has bilateral shoulder fracture with reduced dislocation - Given the circumstances, patient is a relative invalid - she cannot comb her hair, brush her teeth, wipe her bottom, transfer from bed to chair or even stand up - Patient seen by orthopedics Dr. Marlou Sa, status post OPEN REDUCTION INTERNAL FIXATION (ORIF) PROXIMAL HUMERUS FRACTURE 10/11 Left shoulder also may require surgery next week ?[name]  Comminuted fracture of the greater tuberosity and possibly part of the lesser tuberosity indicating significant rotator cuff pathology is noted on CT scan  will needs SNF    HTN - fair control, continue current regimen   DM - A1c in 2/17 was 6.4 (and this is true since 2015), so patient has good outpatient control Held oral medications, continue SSI   Hypothyroidism - TSH was elevated in 2/17 with normal T3 and free T4 - repeat TSH elevated at 4.8, normal free T4. No change in regimen, recheck TSH in 3-4 weeks as an outpatient  CKD II - Cr stable  GERD - Continue Dexilant and Zantac (formulary substitution)  HLD - Continue Zocor  Anemia - Stable/better than  baseline, hemoglobin 11>9.4,>10.1 After Surgery Continue to follow   Fibromylagia - pain regimen as above for #1  Constipation 1 bowel movement, continue MiraLAX, continue Linzess   DVT prophylaxis: Lovenox Code Status: Full Family Communication: d/w son at bedside Disposition Plan: Disposition as per orthopedic surgery, snf   Consultants:   Orthopedic surgery, Dr. Marlou Sa   Procedures:   OPEN REDUCTION INTERNAL FIXATION (ORIF) PROXIMAL HUMERUS FRACTURE right   Antimicrobials:  None    Subjective:   Resting in bed, had a small BM last evening    Objective: Vitals:   01/16/16 0448 01/16/16 1500 01/16/16 2009 01/17/16 0403  BP: 131/64 128/62 (!) 143/68 (!) 155/64  Pulse: 78 82 90 83  Resp: 16 16 16 18   Temp: 98.4 F (36.9 C) 98.1 F (36.7 C) 98.6 F (37 C) 98 F (36.7 C)  TempSrc: Oral Oral Oral Oral  SpO2: 96% 98% 97% 93%  Weight:      Height:        Intake/Output Summary (Last 24 hours) at 01/17/16 1136 Last data filed at 01/17/16 0650  Gross per 24 hour  Intake             2220 ml  Output                0 ml  Net             2220 ml   Filed Weights   01/12/16 1419 01/13/16 0049  Weight: 119.7 kg (264 lb) 119.7 kg (263 lb 14.3 oz)    Examination: Constitutional: NAD Vitals:  01/16/16 0448 01/16/16 1500 01/16/16 2009 01/17/16 0403  BP: 131/64 128/62 (!) 143/68 (!) 155/64  Pulse: 78 82 90 83  Resp: 16 16 16 18   Temp: 98.4 F (36.9 C) 98.1 F (36.7 C) 98.6 F (37 C) 98 F (36.7 C)  TempSrc: Oral Oral Oral Oral  SpO2: 96% 98% 97% 93%  Weight:      Height:       Eyes: lids and conjunctivae normal ENMT: Mucous membranes are moist.  Respiratory: clear to auscultation bilaterally, no wheezing, no crackles.  Cardiovascular: Regular rate and rhythm, no murmurs / rubs / gallops. Abdomen: no tenderness. Bowel sounds positive.  Musculoskeletal: no clubbing / cyanosis.  Skin: no rashes, lesions, ulcers. No induration Neurologic: non focal    Data Reviewed: I have personally reviewed following labs and imaging studies  CBC:  Recent Labs Lab 01/11/16 1003 01/13/16 0409 01/14/16 0725 01/15/16 0001 01/16/16 0415 01/17/16 0446  WBC 7.0 7.1 6.1 7.2 7.4 7.0  NEUTROABS 4.6  --   --   --   --   --   HGB 11.7* 10.6* 11.0* 10.3* 9.4* 10.1*  HCT 37.1 34.9* 36.1 33.2* 30.2* 32.4*  MCV 89.8 92.6 93.8 91.0 90.1 92.0  PLT 169 196 195 214 240 [id number]   Basic Metabolic Panel:  Recent Labs Lab 01/11/16 1003 01/13/16 0409 01/14/16 0432 01/15/16 0001 01/16/16 0415  NA 135 137 136  --  137  K 3.6 3.5 3.6  --  4.0  CL 102 101 100*  --  99*  CO2 27 28 26   --  32  GLUCOSE 126* 103* 97  --  105*  BUN 11 8 9   --  9  CREATININE 0.88 0.91 0.97 1.07* 1.02*  CALCIUM 9.2 9.0 9.2  --  8.6*   GFR: Estimated Creatinine Clearance: 66.5 mL/min (by C-G formula based on SCr of 1.02 mg/dL (H)). Liver Function Tests:  Recent Labs Lab 01/16/16 0415  AST 38  ALT 13*  ALKPHOS 50  BILITOT 0.8  PROT 4.8*  ALBUMIN 2.5*   No results for input(s): LIPASE, AMYLASE in the last 168 hours. No results for input(s): AMMONIA in the last 168 hours. Coagulation Profile: No results for input(s): INR, PROTIME in the last 168 hours. Cardiac Enzymes:  Recent Labs Lab 01/11/16 1003  TROPONINI <0.03   BNP (last 3 results) No results for input(s): PROBNP in the last 8760 hours. HbA1C: No results for input(s): HGBA1C in the last 72 hours. CBG:  Recent Labs Lab 01/16/16 1151 01/16/16 1604 01/16/16 2115 01/17/16 0625 01/17/16 1110  GLUCAP 110* 125* 136* 121* 134*   Lipid Profile: No results for input(s): CHOL, HDL, LDLCALC, TRIG, CHOLHDL, LDLDIRECT in the last 72 hours. Thyroid Function Tests: No results for input(s): TSH, T4TOTAL, FREET4, T3FREE, THYROIDAB in the last 72 hours. Anemia Panel: No results for input(s): VITAMINB12, FOLATE, FERRITIN, TIBC, IRON, RETICCTPCT in the last 72 hours. Urine analysis:    Component Value Date/Time    COLORURINE YELLOW 01/12/2016 1830   APPEARANCEUR CLEAR 01/12/2016 1830   LABSPEC 1.025 01/12/2016 1830   PHURINE 6.0 01/12/2016 1830   GLUCOSEU NEGATIVE 01/12/2016 1830   HGBUR NEGATIVE 01/12/2016 1830   BILIRUBINUR SMALL (A) 01/12/2016 1830   KETONESUR TRACE (A) 01/12/2016 1830   PROTEINUR NEGATIVE 01/12/2016 1830   UROBILINOGEN 0.2 12/30/2012 1300   NITRITE NEGATIVE 01/12/2016 1830   LEUKOCYTESUR NEGATIVE 01/12/2016 1830   Sepsis Labs: Invalid input(s): PROCALCITONIN, LACTICIDVEN  Recent Results (from the past 240 hour(s))  MRSA PCR Screening     Status: None   Collection Time: 01/12/16 11:11 PM  Result Value Ref Range Status   MRSA by PCR NEGATIVE NEGATIVE Final    Comment:        The GeneXpert MRSA Assay (FDA approved for NASAL specimens only), is one component of a comprehensive MRSA colonization surveillance program. It is not intended to diagnose MRSA infection nor to guide or monitor treatment for MRSA infections.       Radiology Studies: Dg Chest Port 1 View  Result Date: 01/16/2016 CLINICAL DATA:  Poor venous access.  Humerus shaft fracture. EXAM: PORTABLE CHEST 1 VIEW COMPARISON:  Chest CT 01/11/2016 FINDINGS: Tip of the left internal jugular central venous catheter projects over the region of the proximal SVC. There is no pneumothorax. Heart size and mediastinal contours are unchanged with mild cardiomegaly. Lung volumes are low. Scattered atelectasis. Possible vascular congestion versus bronchovascular crowding. No pleural effusion. Postsurgical change in the right proximal humerus. IMPRESSION: Tip of the left central line projects over the proximal SVC. No pneumothorax. Cardiomegaly. Possible vascular congestion versus bronchovascular crowding secondary to low lung volumes. Electronically Signed   By: Jeb Levering M.D.   On: 01/16/2016 02:32   Dg Shoulder Right Port  Result Date: 01/16/2016 CLINICAL DATA:  Proximal humerus fracture, postop. EXAM:  PORTABLE RIGHT SHOULDER COMPARISON:  Preoperative exam 01/12/2016 FINDINGS: Lateral plate and multi screw fixation of comminuted proximal humerus fracture. Fracture is in improved alignment. Acromioclavicular and glenohumeral alignment maintained. IMPRESSION: Plate and screw fixation of right proximal humerus fracture, improved in alignment. Electronically Signed   By: Jeb Levering M.D.   On: 01/16/2016 02:30   Dg Humerus Right  Result Date: 01/16/2016 CLINICAL DATA:  ORIF right humerus fracture. EXAM: DG C-ARM GT 120 MIN; RIGHT HUMERUS - 2+ VIEW FLUOROSCOPY TIME:  Fluoroscopy Time:  59 seconds Radiation Exposure Index (if provided by the fluoroscopic device): Not provided Number of Acquired Spot Images: 4 COMPARISON:  Radiographs 01/12/2016 FINDINGS: Lateral plate and multi screw fixation of right proximal humerus fracture. Fracture is in improved alignment compared to preoperative exam. Fluoroscopy time reported 59 seconds. IMPRESSION: Plate and screw fixation of right proximal humerus fracture, in improved alignment. Electronically Signed   By: Jeb Levering M.D.   On: 01/16/2016 00:55   Dg C-arm Gt 120 Min  Result Date: 01/16/2016 CLINICAL DATA:  ORIF right humerus fracture. EXAM: DG C-ARM GT 120 MIN; RIGHT HUMERUS - 2+ VIEW FLUOROSCOPY TIME:  Fluoroscopy Time:  59 seconds Radiation Exposure Index (if provided by the fluoroscopic device): Not provided Number of Acquired Spot Images: 4 COMPARISON:  Radiographs 01/12/2016 FINDINGS: Lateral plate and multi screw fixation of right proximal humerus fracture. Fracture is in improved alignment compared to preoperative exam. Fluoroscopy time reported 59 seconds. IMPRESSION: Plate and screw fixation of right proximal humerus fracture, in improved alignment. Electronically Signed   By: Jeb Levering M.D.   On: 01/16/2016 00:55     Scheduled Meds: . busPIRone  10 mg Oral TID  . docusate sodium  100 mg Oral BID  . enoxaparin (LOVENOX) injection   40 mg Subcutaneous Q24H  . famotidine  20 mg Oral Daily  . hydrochlorothiazide  25 mg Oral Daily  . Influenza vac split quadrivalent PF  0.5 mL Intramuscular Tomorrow-1000  . insulin aspart  0-20 Units Subcutaneous TID WC  . levothyroxine  112 mcg Oral QAC breakfast  . linaclotide  145 mcg Oral QAC breakfast  . pantoprazole  40 mg  Oral Daily  . polyethylene glycol  17 g Oral BID  . potassium chloride  10 mEq Oral Daily  . pregabalin  100 mg Oral TID  . simvastatin  40 mg Oral QHS  . traZODone  100 mg Oral QHS   Continuous Infusions:       [facility] Pager [phone]  [phone]  If 7PM-7AM, please contact night-coverage [email or web address] Password [email or web address] 01/17/2016, 11:36 AM

## 2016-01-17 NOTE — Progress Notes (Signed)
Physical Therapy Treatment Patient Details Name: Yesenia [name] MRN: [id number] DOB: 12-11-1950 Today's Date: 01/17/2016    History of Present Illness pt presents afters falls sustaining Bil Humeral fxs and is now s/p ORIF of R humeral fx and awaiting further treatment of L humeral fx.  pt with hx of DM, HTN, DVT, and Fibromyalgia.      PT Comments    Pt performed increased mobility and required cues for sequencing and safety.  Pt progressed to +1 assistance but remains to require rehab in post acute setting to improve mobility before returning home.    Follow Up Recommendations  SNF     Equipment Recommendations  None recommended by PT    Recommendations for Other Services       Precautions / Restrictions Precautions Precautions: Fall Required Braces or Orthoses: Sling (B shoulders.  waist strap on R shoulder sling.  ) Restrictions Weight Bearing Restrictions: Yes RUE Weight Bearing: Non weight bearing LUE Weight Bearing: Non weight bearing    Mobility  Bed Mobility Overal bed mobility: Needs Assistance Bed Mobility: Supine to Sit;Sit to Supine     Supine to sit: Mod assist Sit to supine: Mod assist   General bed mobility comments: Pt required assist for upper trunk to sit edge of bed.  To return to bed assist to lift B LEs against gravity and assist to reposition trunk and hips.    Transfers Overall transfer level: Needs assistance Equipment used: None Transfers: Sit to/from Stand Sit to Stand: Mod assist         General transfer comment: Cues for sequencing, rocking momentum with forward weight shifting.  For stand to sit patient requires min assist.    Ambulation/Gait Ambulation/Gait assistance: Min assist Ambulation Distance (Feet): 100 Feet Assistive device: None Gait Pattern/deviations: Step-to pattern;Wide base of support;Trunk flexed;Decreased stride length   Gait velocity interpretation: Below normal speed for age/gender General Gait Details:  Pt progressed gait distance with slow waddling BOS.  Pt required cues for weight shifting and awareness of door frames to avoud bumping into.     Stairs            Wheelchair Mobility    Modified Rankin (Stroke Patients Only)       Balance Overall balance assessment: Needs assistance;History of Falls   Sitting balance-Leahy Scale: Good       Standing balance-Leahy Scale: Poor Standing balance comment: Pt needs assist for any balance challenges.                      Cognition Arousal/Alertness: Awake/alert Behavior During Therapy: WFL for tasks assessed/performed Overall Cognitive Status: Within Functional Limits for tasks assessed                      Exercises      General Comments        Pertinent Vitals/Pain Pain Assessment: 0-10 Pain Score: 8  Pain Descriptors / Indicators: Aching;Grimacing;Guarding Pain Intervention(s): Monitored during session;Repositioned;Ice applied    Home Living                      Prior Function            PT Goals (current goals can now be found in the care plan section) Acute Rehab PT Goals Patient Stated Goal: Home ASAP Potential to Achieve Goals: Good Progress towards PT goals: Progressing toward goals    Frequency    Min 3X/week  PT Plan Current plan remains appropriate    Co-evaluation             End of Session Equipment Utilized During Treatment: Gait belt Activity Tolerance: Patient limited by pain (limited by nausea) Patient left: in bed;with call bell/phone within reach (requested back to bed secondary to nausea and back pain.  )     Time: [id number] PT Time Calculation (min) (ACUTE ONLY): 41 min  Charges:  $Gait Training: 8-22 mins $Therapeutic Activity: 23-37 mins                    G Codes:      Cristela Blue 2016/01/29, 12:09 PM Governor Rooks, PTA pager [phone]

## 2016-01-17 NOTE — Progress Notes (Signed)
Slow to progress Left shoulder 7 days out from injury May benefit from fixation next week Right arm needs to be a little more functional

## 2016-01-17 NOTE — Progress Notes (Signed)
SW met with patient at bedside. Patient was alert and oriented and communicating effectively. Patient states that she is interested in facility for rehab. Patient states that her first choice is [facility].   SW informed patient that she would refer her to multiple facilities. Patient states that she is agreeable to being referred to facilities. SW will continue to follow up and keep patient updated.   Tilda Burrow, MSW [phone]

## 2016-01-17 NOTE — Progress Notes (Signed)
Occupational Therapy Treatment Patient Details Name: Yesenia [name] MRN: [id number] DOB: 1951/01/30 Today's Date: 01/17/2016    History of present illness pt presents afters falls sustaining Bil Humeral fxs and is now s/p ORIF of R humeral fx and awaiting further treatment of L humeral fx.  pt with hx of DM, HTN, DVT, and Fibromyalgia.     OT comments  Pt making progress towards goals. Pt participated in ROM at elbow, wrist, and digits, and bed level ADL as NWB allowed. Pt reported that she is scheduled for surgery on Monday, and already has a bed at [facility] (which was her SNF of choice). Pt continues to benefit from skilled OT in the acute care setting to maximize education and independence in ADL and safety.  Follow Up Recommendations  SNF    Equipment Recommendations  Other (comment) (defer to next venue of care)    Recommendations for Other Services      Precautions / Restrictions Precautions Precautions: Fall;Shoulder Type of Shoulder Precautions: conservative - AROM at the elbow, wrist and hand only Shoulder Interventions: Shoulder sling/immobilizer;At all times Precaution Booklet Issued: Yes (comment) Precaution Comments: shoulder d/c handout issued Required Braces or Orthoses: Sling (B shoulders. waist strap on R shoulder sling.) Restrictions Weight Bearing Restrictions: Yes RUE Weight Bearing: Non weight bearing LUE Weight Bearing: Non weight bearing       Mobility Bed Mobility               General bed mobility comments: Pt performed ADL bed level  Transfers                 General transfer comment: Due to pain, Pt declined OOB    Balance                                   ADL Overall ADL's : Needs assistance/impaired     Grooming: Maximal assistance;Wash/dry face;Oral care;Brushing hair;Sitting;Bed level Grooming Details (indicate cue type and reason): Pt able to brush teeth with set up sitting at bed level and cup method  employed. Pt total assist for brushing hair, washing face.                               General ADL Comments: Pt NWB BUE and no ROM B shoulders impacting level of assist with ADLs. Pt able to bring LUE to chin/mouth for oral care      Vision                     Perception     Praxis      Cognition   Behavior During Therapy: [facility] for tasks assessed/performed Overall Cognitive Status: Within Functional Limits for tasks assessed                       Extremity/Trunk Assessment               Exercises Other Exercises Other Exercises: encouraged digit and wrist exercises as there is swelling bilaterally. Pt agreeable and performed. Pt with active ROM at Left elbow during functional ADL, and limited tolerance during R elbow flexion/extension.   Shoulder Instructions       General Comments      Pertinent Vitals/ Pain       Pain Assessment: 0-10 Pain Score: 8  Pain Location: BUE (more in  RUE) Pain Descriptors / Indicators: Aching;Discomfort;Grimacing;Tightness Pain Intervention(s): Limited activity within patient's tolerance;Monitored during session;RN gave pain meds during session;Ice applied  Home Living                                          Prior Functioning/Environment              Frequency  Min 2X/week        Progress Toward Goals  OT Goals(current goals can now be found in the care plan section)  Progress towards OT goals: Progressing toward goals  Acute Rehab OT Goals Patient Stated Goal: Home ASAP  Plan Discharge plan remains appropriate    Co-evaluation                 End of Session     Activity Tolerance Patient limited by pain   Patient Left in bed;with call bell/phone within reach;with nursing/sitter in room   Nurse Communication          Time: [id number] OT Time Calculation (min): 21 min  Charges: OT General Charges $OT Visit: 1 Procedure OT Treatments $Self  Care/Home Management : 8-22 mins  Merri Ray Dinisha Cai 01/17/2016, 4:17 PM  Hulda Humphrey OTR/L [phone]

## 2016-01-18 LAB — GLUCOSE, CAPILLARY
Glucose-Capillary: 119 mg/dL — ABNORMAL HIGH (ref 65–99)
Glucose-Capillary: 146 mg/dL — ABNORMAL HIGH (ref 65–99)
Glucose-Capillary: 175 mg/dL — ABNORMAL HIGH (ref 65–99)
Glucose-Capillary: 103 mg/dL — ABNORMAL HIGH (ref 65–99)

## 2016-01-18 MED ORDER — METHYLNALTREXONE BROMIDE 12 MG/0.6ML ~~LOC~~ SOLN
12.0000 mg | Freq: Once | SUBCUTANEOUS | Status: AC
  Administered 2016-01-18: 12 mg via SUBCUTANEOUS
  Filled 2016-01-18: qty 0.6

## 2016-01-18 NOTE — Progress Notes (Signed)
Occupational Therapy Treatment Patient Details Name: Yesenia [name] MRN: [id number] DOB: Sep 03, 1950 Today's Date: 01/18/2016    History of present illness pt presents afters falls sustaining Bil Humeral fxs and is now s/p ORIF of R humeral fx and awaiting further treatment of L humeral fx.  pt with hx of DM, HTN, DVT, and Fibromyalgia.     OT comments  Pt. Remains motivated to maintain mobility and active participation in [place] as able.  Completed bed mobility, toileting tasks, and B UE rom this. Day.  Will continue to follow acutely. Note sx. mon or tues.    Follow Up Recommendations  SNF    Equipment Recommendations       Recommendations for Other Services      Precautions / Restrictions Precautions Precautions: Fall;Shoulder Type of Shoulder Precautions: conservative - AROM at the elbow, wrist and hand only Shoulder Interventions: Shoulder sling/immobilizer;At all times Restrictions RUE Weight Bearing: Non weight bearing LUE Weight Bearing: Non weight bearing       Mobility Bed Mobility Overal bed mobility: Needs Assistance Bed Mobility: Supine to Sit     Supine to sit: Mod assist     General bed mobility comments: able to bring B les OOB, required trunk support to bring self upright, then able to continue to guide hips and LES towards eob for un supported sitting  Transfers Overall transfer level: Needs assistance Equipment used: None Transfers: Sit to/from [facility] Sit to Stand: Min guard Stand pivot transfers: Min guard            Balance                                   ADL Overall ADL's : Needs assistance/impaired     Grooming: Wash/dry hands;Minimal assistance;Sitting                   Toilet Transfer: Min guard;Ambulation;BSC Toilet Transfer Details (indicate cue type and reason): pt. says "my goal is to walk in there to use the b.room this morning and that is what im gonna do" prefers use of 3n1 in  the b.room not over commode. says she feels more comfortable and safe with wider seat Toileting- Clothing Manipulation and Hygiene: Maximal assistance;Sit to/from stand Toileting - Clothing Manipulation Details (indicate cue type and reason): assistance provided as pt. not able to use either UE      Functional mobility during ADLs: Min guard        Vision                     Perception     Praxis      Cognition   Behavior During Therapy: [facility] for tasks assessed/performed Overall Cognitive Status: Within Functional Limits for tasks assessed                       Extremity/Trunk Assessment               Exercises Other Exercises Other Exercises: encouraged digit and wrist exercises as there is swelling bilaterally. Pt agreeable and performed. Pt with active ROM at Left elbow during functional ADL, and limited tolerance during R elbow flexion/extension.   Shoulder Instructions       General Comments      Pertinent Vitals/ Pain       Pain Assessment:  (did not rate but states mostly hurts after  activity)  Home Living                                          Prior Functioning/Environment              Frequency  Min 2X/week        Progress Toward Goals  OT Goals(current goals can now be found in the care plan section)  Progress towards OT goals: Progressing toward goals     Plan Discharge plan remains appropriate    Co-evaluation                 End of Session Equipment Utilized During Treatment: Gait belt   Activity Tolerance Patient tolerated treatment well   Patient Left in chair;with call bell/phone within reach   Nurse Communication Other (comment) (pt. requests b.fast reheated and needs assistance for feeding)        Time: [id number] OT Time Calculation (min): 24 min  Charges: OT General Charges $OT Visit: 1 Procedure OT Treatments $Self Care/Home Management : 8-22 mins $Therapeutic  Exercise: 8-22 mins  Janice Coffin, COTA/L 01/18/2016, 10:03 AM

## 2016-01-18 NOTE — Progress Notes (Signed)
Plan or for Tuesday for left shoulder

## 2016-01-18 NOTE — Progress Notes (Signed)
PROGRESS NOTE  Yesenia [name] [id number] DOB: [birth year] DOA: 01/12/2016 PCP: Vic Blackbird, MD   LOS: 6 days   Brief Narrative: Yesenia [name] is a 65 y.o. female with medical history significant of DM, hypothyrdoism, remote h/o DVT/PE, HTN, fibromylagia, and GERD presenting for her 4th ED visit in as many days following a fall, was diagnosed with bilateral humerus fracture. Orthopedic surgery consulted, s/p operative repair on 10/11.  Assessment & Plan: Principal Problem:   Humerus fracture Active Problems:   GERD   Essential hypertension, benign   Hypothyroidism   Morbid obesity (HCC)   Fibromyalgia   Type II diabetes mellitus with neurological manifestations (HCC)   CKD (chronic kidney disease), stage II   Hyperlipemia   Anemia   Proximal humerus fracture   Bilateral humerus fractures She has bilateral shoulder fracture with reduced dislocation.Slow to progress - Patient seen by orthopedics Dr. Marlou Sa, status post OPEN REDUCTION INTERNAL FIXATION (ORIF) PROXIMAL HUMERUS FRACTURE 10/11 Left shoulder also may require surgery next week ?[name]  Comminuted fracture of the greater tuberosity and possibly part of the lesser tuberosity indicating significant rotator cuff pathology is noted on CT scan  will needs SNF , likely discharge next week after left shoulder surgery   HTN - fair control, continue current regimen   DM - A1c in 2/17 was 6.4 (and this is true since 2015), so patient has good outpatient control Held oral medications, continue SSI   Hypothyroidism - TSH was elevated in 2/17 with normal T3 and free T4 - repeat TSH elevated at 4.8, normal free T4. No change in regimen, recheck TSH in 3-4 weeks as an outpatient  CKD II - Cr stable  GERD - Continue Dexilant and Zantac (formulary substitution)  HLD - Continue Zocor  Anemia - Stable/better than baseline, hemoglobin 11>9.4,>10.1 After Surgery Continue to follow   Fibromylagia - pain  regimen as above for #1  Constipation 1 bowel movement, continue MiraLAX, continue Linzess add relistor as has not had a significant BM since admission  DVT prophylaxis: Lovenox Code Status: Full Family Communication: d/w son at bedside Disposition Plan: Disposition as per orthopedic surgery, snf next week   Consultants:   Orthopedic surgery, Dr. Marlou Sa   Procedures:   OPEN REDUCTION INTERNAL FIXATION (ORIF) PROXIMAL HUMERUS FRACTURE right   Antimicrobials:  None    Subjective:   Resting in bed, had a small BM last evening ,    Objective: Vitals:   01/17/16 0403 01/17/16 1345 01/17/16 2033 01/18/16 0419  BP: (!) 155/64 (!) 148/62 139/62 120/61  Pulse: 83 85 79 78  Resp: 18  18 18   Temp: 98 F (36.7 C) 97.7 F (36.5 C) 97.7 F (36.5 C) 98.7 F (37.1 C)  TempSrc: Oral Oral Oral Oral  SpO2: 93% 95% 99% 94%  Weight:      Height:       No intake or output data in the 24 hours ending 01/18/16 0845 Filed Weights   01/12/16 1419 01/13/16 0049  Weight: 119.7 kg (264 lb) 119.7 kg (263 lb 14.3 oz)    Examination: Constitutional: NAD Vitals:   01/17/16 0403 01/17/16 1345 01/17/16 2033 01/18/16 0419  BP: (!) 155/64 (!) 148/62 139/62 120/61  Pulse: 83 85 79 78  Resp: 18  18 18   Temp: 98 F (36.7 C) 97.7 F (36.5 C) 97.7 F (36.5 C) 98.7 F (37.1 C)  TempSrc: Oral Oral Oral Oral  SpO2: 93% 95% 99% 94%  Weight:      Height:  Eyes: lids and conjunctivae normal ENMT: Mucous membranes are moist.  Respiratory: clear to auscultation bilaterally, no wheezing, no crackles.  Cardiovascular: Regular rate and rhythm, no murmurs / rubs / gallops. Abdomen: no tenderness. Bowel sounds positive.  Musculoskeletal: no clubbing / cyanosis.  Skin: no rashes, lesions, ulcers. No induration Neurologic: non focal   Data Reviewed: I have personally reviewed following labs and imaging studies  CBC:  Recent Labs Lab 01/11/16 1003 01/13/16 0409 01/14/16 0725  01/15/16 0001 01/16/16 0415 01/17/16 0446  WBC 7.0 7.1 6.1 7.2 7.4 7.0  NEUTROABS 4.6  --   --   --   --   --   HGB 11.7* 10.6* 11.0* 10.3* 9.4* 10.1*  HCT 37.1 34.9* 36.1 33.2* 30.2* 32.4*  MCV 89.8 92.6 93.8 91.0 90.1 92.0  PLT 169 196 195 214 240 [id number]   Basic Metabolic Panel:  Recent Labs Lab 01/11/16 1003 01/13/16 0409 01/14/16 0432 01/15/16 0001 01/16/16 0415  NA 135 137 136  --  137  K 3.6 3.5 3.6  --  4.0  CL 102 101 100*  --  99*  CO2 27 28 26   --  32  GLUCOSE 126* 103* 97  --  105*  BUN 11 8 9   --  9  CREATININE 0.88 0.91 0.97 1.07* 1.02*  CALCIUM 9.2 9.0 9.2  --  8.6*   GFR: Estimated Creatinine Clearance: 66.5 mL/min (by C-G formula based on SCr of 1.02 mg/dL (H)). Liver Function Tests:  Recent Labs Lab 01/16/16 0415  AST 38  ALT 13*  ALKPHOS 50  BILITOT 0.8  PROT 4.8*  ALBUMIN 2.5*   No results for input(s): LIPASE, AMYLASE in the last 168 hours. No results for input(s): AMMONIA in the last 168 hours. Coagulation Profile: No results for input(s): INR, PROTIME in the last 168 hours. Cardiac Enzymes:  Recent Labs Lab 01/11/16 1003  TROPONINI <0.03   BNP (last 3 results) No results for input(s): PROBNP in the last 8760 hours. HbA1C: No results for input(s): HGBA1C in the last 72 hours. CBG:  Recent Labs Lab 01/17/16 0625 01/17/16 1110 01/17/16 1631 01/17/16 2044 01/18/16 0531  GLUCAP 121* 134* 104* 79 119*   Lipid Profile: No results for input(s): CHOL, HDL, LDLCALC, TRIG, CHOLHDL, LDLDIRECT in the last 72 hours. Thyroid Function Tests: No results for input(s): TSH, T4TOTAL, FREET4, T3FREE, THYROIDAB in the last 72 hours. Anemia Panel: No results for input(s): VITAMINB12, FOLATE, FERRITIN, TIBC, IRON, RETICCTPCT in the last 72 hours. Urine analysis:    Component Value Date/Time   COLORURINE YELLOW 01/12/2016 1830   APPEARANCEUR CLEAR 01/12/2016 1830   LABSPEC 1.025 01/12/2016 1830   PHURINE 6.0 01/12/2016 1830   GLUCOSEU  NEGATIVE 01/12/2016 1830   HGBUR NEGATIVE 01/12/2016 1830   BILIRUBINUR SMALL (A) 01/12/2016 1830   KETONESUR TRACE (A) 01/12/2016 1830   PROTEINUR NEGATIVE 01/12/2016 1830   UROBILINOGEN 0.2 12/30/2012 1300   NITRITE NEGATIVE 01/12/2016 1830   LEUKOCYTESUR NEGATIVE 01/12/2016 1830   Sepsis Labs: Invalid input(s): PROCALCITONIN, LACTICIDVEN  Recent Results (from the past 240 hour(s))  MRSA PCR Screening     Status: None   Collection Time: 01/12/16 11:11 PM  Result Value Ref Range Status   MRSA by PCR NEGATIVE NEGATIVE Final    Comment:        The GeneXpert MRSA Assay (FDA approved for NASAL specimens only), is one component of a comprehensive MRSA colonization surveillance program. It is not intended to diagnose MRSA infection nor to guide  or monitor treatment for MRSA infections.       Radiology Studies: No results found.   Scheduled Meds: . busPIRone  10 mg Oral TID  . docusate sodium  100 mg Oral BID  . enoxaparin (LOVENOX) injection  40 mg Subcutaneous Q24H  . famotidine  20 mg Oral Daily  . hydrochlorothiazide  25 mg Oral Daily  . Influenza vac split quadrivalent PF  0.5 mL Intramuscular Tomorrow-1000  . insulin aspart  0-20 Units Subcutaneous TID WC  . levothyroxine  112 mcg Oral QAC breakfast  . linaclotide  145 mcg Oral QAC breakfast  . pantoprazole  40 mg Oral Daily  . polyethylene glycol  17 g Oral BID  . potassium chloride  10 mEq Oral Daily  . pregabalin  100 mg Oral TID  . simvastatin  40 mg Oral QHS  . traZODone  100 mg Oral QHS   Continuous Infusions:       [facility] Pager [phone]  [phone]  If 7PM-7AM, please contact night-coverage [email or web address] Password [email or web address] 01/18/2016, 8:45 AM

## 2016-01-19 LAB — GLUCOSE, CAPILLARY
GLUCOSE-CAPILLARY: 109 mg/dL — AB (ref 65–99)
Glucose-Capillary: 121 mg/dL — ABNORMAL HIGH (ref 65–99)
Glucose-Capillary: 155 mg/dL — ABNORMAL HIGH (ref 65–99)
Glucose-Capillary: 93 mg/dL (ref 65–99)

## 2016-01-19 MED ORDER — HYDROCHLOROTHIAZIDE 25 MG PO TABS
12.5000 mg | ORAL_TABLET | Freq: Every day | ORAL | Status: DC
  Administered 2016-01-21: 12.5 mg via ORAL
  Filled 2016-01-19 (×2): qty 1

## 2016-01-19 NOTE — Progress Notes (Addendum)
PROGRESS NOTE  Yesenia [name] [id number] DOB: 07-16-1950 DOA: 01/12/2016 PCP: Vic Blackbird, MD   LOS: 7 days   Brief Narrative: Yesenia [name] is a 65 y.o. female with medical history significant of DM, hypothyrdoism, remote h/o DVT/PE, HTN, fibromylagia, and GERD presenting for her 4th ED visit in as many days following a fall, was diagnosed with bilateral humerus fracture. Orthopedic surgery consulted, s/p operative repair on 10/11.  Assessment & Plan: Principal Problem:   Humerus fracture Active Problems:   GERD   Essential hypertension, benign   Hypothyroidism   Morbid obesity (HCC)   Fibromyalgia   Type II diabetes mellitus with neurological manifestations (HCC)   CKD (chronic kidney disease), stage II   Hyperlipemia   Anemia   Proximal humerus fracture   Bilateral humerus fractures She has bilateral shoulder fracture  - Patient seen by orthopedics Dr. Marlou Sa, status post OPEN REDUCTION INTERNAL FIXATION (ORIF) PROXIMAL HUMERUS FRACTURE 10/11 Plan or for Tuesday for left shoulder.  Comminuted fracture of the greater tuberosity and possibly part of the lesser tuberosity indicating significant rotator cuff pathology is noted on CT scan  will needs SNF , likely discharge next week after left shoulder surgery, patient says surgery is tomorrow    HTN Uncontrolled, reduced dose of HCTZ given anticipated surgery next week and relatively low blood pressure   DM - A1c in 2/17 was 6.4 (and this is true since 2015), controlled Held oral medications, continue SSI   Hypothyroidism - TSH was elevated in 2/17 with normal T3 and free T4 - repeat TSH elevated at 4.8, normal free T4. No change in regimen, recheck TSH in 3-4 weeks as an outpatient  CKD II - Cr stable, recheck renal function tomorrow  GERD - Continue Dexilant and Zantac (formulary substitution)  HLD - Continue Zocor  Anemia - Stable/better than baseline, hemoglobin 11>9.4,>10.1 After  Surgery Continue to follow   Fibromylagia - pain regimen as above for #1  Constipation 1 bowel movement, continue MiraLAX, continue Linzess Received relistor 10/14, patient had a BM yesterday with excellent results, very pleased    DVT prophylaxis: Lovenox Code Status: Full Family Communication: d/w son at bedside Disposition Plan: Disposition as per orthopedic surgery, snf next week   Consultants:   Orthopedic surgery, Dr. Marlou Sa   Procedures:   OPEN REDUCTION INTERNAL FIXATION (ORIF) PROXIMAL HUMERUS FRACTURE right   Antimicrobials:  None    Subjective: Large BM yesterday   Objective: Vitals:   01/18/16 0419 01/18/16 1500 01/18/16 1935 01/19/16 0507  BP: 120/61 (!) 121/49 (!) 123/46 129/70  Pulse: 78 84 79 73  Resp: 18  16 16   Temp: 98.7 F (37.1 C) 98.9 F (37.2 C) 98.2 F (36.8 C) 97.4 F (36.3 C)  TempSrc: Oral Axillary Oral Oral  SpO2: 94% 91% 96% 91%  Weight:      Height:       No intake or output data in the 24 hours ending 01/19/16 0951 Filed Weights   01/12/16 1419 01/13/16 0049  Weight: 119.7 kg (264 lb) 119.7 kg (263 lb 14.3 oz)    Examination: Constitutional: NAD Vitals:   01/18/16 0419 01/18/16 1500 01/18/16 1935 01/19/16 0507  BP: 120/61 (!) 121/49 (!) 123/46 129/70  Pulse: 78 84 79 73  Resp: 18  16 16   Temp: 98.7 F (37.1 C) 98.9 F (37.2 C) 98.2 F (36.8 C) 97.4 F (36.3 C)  TempSrc: Oral Axillary Oral Oral  SpO2: 94% 91% 96% 91%  Weight:  Height:       Eyes: lids and conjunctivae normal ENMT: Mucous membranes are moist.  Respiratory: clear to auscultation bilaterally, no wheezing, no crackles.  Cardiovascular: Regular rate and rhythm, no murmurs / rubs / gallops. Abdomen: no tenderness. Bowel sounds positive.  Musculoskeletal: no clubbing / cyanosis.  Skin: no rashes, lesions, ulcers. No induration Neurologic: non focal   Data Reviewed: I have personally reviewed following labs and imaging  studies  CBC:  Recent Labs Lab 01/13/16 0409 01/14/16 0725 01/15/16 0001 01/16/16 0415 01/17/16 0446  WBC 7.1 6.1 7.2 7.4 7.0  HGB 10.6* 11.0* 10.3* 9.4* 10.1*  HCT 34.9* 36.1 33.2* 30.2* 32.4*  MCV 92.6 93.8 91.0 90.1 92.0  PLT 196 195 214 240 [id number]   Basic Metabolic Panel:  Recent Labs Lab 01/13/16 0409 01/14/16 0432 01/15/16 0001 01/16/16 0415  NA 137 136  --  137  K 3.5 3.6  --  4.0  CL 101 100*  --  99*  CO2 28 26  --  32  GLUCOSE 103* 97  --  105*  BUN 8 9  --  9  CREATININE 0.91 0.97 1.07* 1.02*  CALCIUM 9.0 9.2  --  8.6*   GFR: Estimated Creatinine Clearance: 66.5 mL/min (by C-G formula based on SCr of 1.02 mg/dL (H)). Liver Function Tests:  Recent Labs Lab 01/16/16 0415  AST 38  ALT 13*  ALKPHOS 50  BILITOT 0.8  PROT 4.8*  ALBUMIN 2.5*   No results for input(s): LIPASE, AMYLASE in the last 168 hours. No results for input(s): AMMONIA in the last 168 hours. Coagulation Profile: No results for input(s): INR, PROTIME in the last 168 hours. Cardiac Enzymes: No results for input(s): CKTOTAL, CKMB, CKMBINDEX, TROPONINI in the last 168 hours. BNP (last 3 results) No results for input(s): PROBNP in the last 8760 hours. HbA1C: No results for input(s): HGBA1C in the last 72 hours. CBG:  Recent Labs Lab 01/18/16 0531 01/18/16 1233 01/18/16 1613 01/18/16 2106 01/19/16 0606  GLUCAP 119* 146* 175* 103* 109*   Lipid Profile: No results for input(s): CHOL, HDL, LDLCALC, TRIG, CHOLHDL, LDLDIRECT in the last 72 hours. Thyroid Function Tests: No results for input(s): TSH, T4TOTAL, FREET4, T3FREE, THYROIDAB in the last 72 hours. Anemia Panel: No results for input(s): VITAMINB12, FOLATE, FERRITIN, TIBC, IRON, RETICCTPCT in the last 72 hours. Urine analysis:    Component Value Date/Time   COLORURINE YELLOW 01/12/2016 1830   APPEARANCEUR CLEAR 01/12/2016 1830   LABSPEC 1.025 01/12/2016 1830   PHURINE 6.0 01/12/2016 1830   GLUCOSEU NEGATIVE 01/12/2016  1830   HGBUR NEGATIVE 01/12/2016 1830   BILIRUBINUR SMALL (A) 01/12/2016 1830   KETONESUR TRACE (A) 01/12/2016 1830   PROTEINUR NEGATIVE 01/12/2016 1830   UROBILINOGEN 0.2 12/30/2012 1300   NITRITE NEGATIVE 01/12/2016 1830   LEUKOCYTESUR NEGATIVE 01/12/2016 1830   Sepsis Labs: Invalid input(s): PROCALCITONIN, LACTICIDVEN  Recent Results (from the past 240 hour(s))  MRSA PCR Screening     Status: None   Collection Time: 01/12/16 11:11 PM  Result Value Ref Range Status   MRSA by PCR NEGATIVE NEGATIVE Final    Comment:        The GeneXpert MRSA Assay (FDA approved for NASAL specimens only), is one component of a comprehensive MRSA colonization surveillance program. It is not intended to diagnose MRSA infection nor to guide or monitor treatment for MRSA infections.       Radiology Studies: No results found.   Scheduled Meds: . busPIRone  10 mg  Oral TID  . docusate sodium  100 mg Oral BID  . famotidine  20 mg Oral Daily  . hydrochlorothiazide  12.5 mg Oral Daily  . insulin aspart  0-20 Units Subcutaneous TID WC  . levothyroxine  112 mcg Oral QAC breakfast  . linaclotide  145 mcg Oral QAC breakfast  . pantoprazole  40 mg Oral Daily  . polyethylene glycol  17 g Oral BID  . potassium chloride  10 mEq Oral Daily  . pregabalin  100 mg Oral TID  . simvastatin  40 mg Oral QHS  . traZODone  100 mg Oral QHS   Continuous Infusions:       [facility] Pager [phone]  [phone]  If 7PM-7AM, please contact night-coverage [email or web address] Password [email or web address] 01/19/2016, 9:51 AM

## 2016-01-19 NOTE — Progress Notes (Signed)
Occupational Therapy Treatment Patient Details Name: IKIA GREGUS MRN: [id number] DOB: Aug 29, 1950 Today's Date: 01/19/2016    History of present illness pt presents afters falls sustaining Bil Humeral fxs and is now s/p ORIF of R humeral fx and awaiting further treatment of L humeral fx.  pt with hx of DM, HTN, DVT, and Fibromyalgia.     OT comments  Focus of today's session was continued ROM to B digits, wrists, and elbows.  Pt. Pleased as she states she "worked" on her R arm all day yesterday and presents with decreased swelling in digits and forearm.  Able to tolerate all ROM today.  Pt. Eager for upcoming sx. And wants to "hurry up and get better".  Will follow along for our role in her care after tomorrow's sx.    Follow Up Recommendations  SNF    Equipment Recommendations       Recommendations for Other Services      Precautions / Restrictions Precautions Precautions: Fall;Shoulder Type of Shoulder Precautions: conservative - AROM at the elbow, wrist and hand only Shoulder Interventions: Shoulder sling/immobilizer;At all times Restrictions RUE Weight Bearing: Non weight bearing LUE Weight Bearing: Non weight bearing       Mobility Bed Mobility                  Transfers                      Balance                                   ADL                                                Vision                     Perception     Praxis      Cognition                             Extremity/Trunk Assessment               Exercises Other Exercises Other Exercises: encouraged digit and wrist exercises.  noted great reduction in R UE digit swelling.  Pt agreeable and performed. Pt with active ROM at Left elbow and able to perform R elbow flex/ext. this day with greater ease.   Shoulder Instructions       General Comments      Pertinent Vitals/ Pain          Home Living                                          Prior Functioning/Environment              Frequency  Min 2X/week        Progress Toward Goals  OT Goals(current goals can now be found in the care plan section)  Progress towards OT goals: Progressing toward goals     Plan Discharge plan remains appropriate    Co-evaluation  End of Session     Activity Tolerance Patient tolerated treatment well   Patient Left in bed;with call bell/phone within reach   Nurse Communication          Time: 1001-1014 OT Time Calculation (min): 13 min  Charges: OT Treatments $Therapeutic Exercise: 8-22 mins  Janice Coffin, COTA/L 01/19/2016, 10:15 AM

## 2016-01-20 ENCOUNTER — Inpatient Hospital Stay (HOSPITAL_COMMUNITY): Payer: Medicare Other | Admitting: Certified Registered Nurse Anesthetist

## 2016-01-20 ENCOUNTER — Encounter (HOSPITAL_COMMUNITY)
Admission: EM | Disposition: A | Discharge status: Skilled Nursing Facility | Payer: Self-pay | Source: Home / Self Care | Attending: Internal Medicine

## 2016-01-20 ENCOUNTER — Encounter (HOSPITAL_COMMUNITY): Payer: Self-pay | Admitting: Anesthesiology

## 2016-01-20 HISTORY — PX: ORIF HUMERUS FRACTURE: SHX2126

## 2016-01-20 LAB — CBC
HEMATOCRIT: 29.5 % — AB (ref 36.0–46.0)
Hemoglobin: 9 g/dL — ABNORMAL LOW (ref 12.0–15.0)
MCH: 28.1 pg (ref 26.0–34.0)
MCHC: 30.5 g/dL (ref 30.0–36.0)
MCV: 92.2 fL (ref 78.0–100.0)
PLATELETS: 238 10*3/uL (ref 150–400)
RBC: 3.2 MIL/uL — ABNORMAL LOW (ref 3.87–5.11)
RDW: 16.1 % — ABNORMAL HIGH (ref 11.5–15.5)
WBC: 5.8 10*3/uL (ref 4.0–10.5)

## 2016-01-20 LAB — COMPREHENSIVE METABOLIC PANEL
ALK PHOS: 49 U/L (ref 38–126)
ALT: 8 U/L — AB (ref 14–54)
AST: 16 U/L (ref 15–41)
Albumin: 2.3 g/dL — ABNORMAL LOW (ref 3.5–5.0)
Anion gap: 7 (ref 5–15)
BILIRUBIN TOTAL: 0.8 mg/dL (ref 0.3–1.2)
BUN: 11 mg/dL (ref 6–20)
CALCIUM: 9.1 mg/dL (ref 8.9–10.3)
CHLORIDE: 102 mmol/L (ref 101–111)
CO2: 29 mmol/L (ref 22–32)
CREATININE: 0.87 mg/dL (ref 0.44–1.00)
Glucose, Bld: 101 mg/dL — ABNORMAL HIGH (ref 65–99)
Potassium: 3.7 mmol/L (ref 3.5–5.1)
Sodium: 138 mmol/L (ref 135–145)
TOTAL PROTEIN: 5.1 g/dL — AB (ref 6.5–8.1)

## 2016-01-20 LAB — GLUCOSE, CAPILLARY
GLUCOSE-CAPILLARY: 86 mg/dL (ref 65–99)
GLUCOSE-CAPILLARY: 98 mg/dL (ref 65–99)
Glucose-Capillary: 83 mg/dL (ref 65–99)

## 2016-01-20 SURGERY — OPEN REDUCTION INTERNAL FIXATION (ORIF) PROXIMAL HUMERUS FRACTURE
Anesthesia: General | Site: Shoulder | Laterality: Left

## 2016-01-20 MED ORDER — MEPERIDINE HCL 25 MG/ML IJ SOLN: 6.2500 mg | INTRAMUSCULAR | Status: DC | PRN

## 2016-01-20 MED ORDER — SUGAMMADEX SODIUM 200 MG/2ML IV SOLN
INTRAVENOUS | Status: DC | PRN
  Administered 2016-01-20: 200 mg via INTRAVENOUS

## 2016-01-20 MED ORDER — LIDOCAINE 2% (20 MG/ML) 5 ML SYRINGE
INTRAMUSCULAR | Status: AC
  Filled 2016-01-20: qty 5

## 2016-01-20 MED ORDER — SUGAMMADEX SODIUM 200 MG/2ML IV SOLN
INTRAVENOUS | Status: AC
Start: 2016-01-20 — End: 2016-01-20
  Filled 2016-01-20: qty 2

## 2016-01-20 MED ORDER — ROCURONIUM BROMIDE 10 MG/ML (PF) SYRINGE
PREFILLED_SYRINGE | INTRAVENOUS | Status: AC
  Filled 2016-01-20: qty 10

## 2016-01-20 MED ORDER — ONDANSETRON HCL 4 MG/2ML IJ SOLN
INTRAMUSCULAR | Status: AC
  Filled 2016-01-20: qty 2

## 2016-01-20 MED ORDER — HYDROMORPHONE HCL 1 MG/ML IJ SOLN
0.2500 mg | INTRAMUSCULAR | Status: DC | PRN
  Administered 2016-01-21 (×4): 0.5 mg via INTRAVENOUS

## 2016-01-20 MED ORDER — CEFAZOLIN SODIUM-DEXTROSE 2-3 GM-% IV SOLR
INTRAVENOUS | Status: DC | PRN
  Administered 2016-01-20: 2 g via INTRAVENOUS

## 2016-01-20 MED ORDER — PROMETHAZINE HCL 25 MG/ML IJ SOLN: 6.2500 mg | INTRAMUSCULAR | Status: DC | PRN

## 2016-01-20 MED ORDER — LACTATED RINGERS IV SOLN: INTRAVENOUS | Status: DC

## 2016-01-20 MED ORDER — LORAZEPAM 2 MG/ML IJ SOLN
0.5000 mg | Freq: Once | INTRAMUSCULAR | Status: AC
  Administered 2016-01-20: 0.5 mg via INTRAVENOUS
  Filled 2016-01-20: qty 1

## 2016-01-20 MED ORDER — CLONAZEPAM 1 MG PO TABS
1.0000 mg | ORAL_TABLET | Freq: Once | ORAL | Status: AC
  Administered 2016-01-20: 1 mg via ORAL
  Filled 2016-01-20: qty 1

## 2016-01-20 MED ORDER — CEFAZOLIN SODIUM 1 G IJ SOLR
INTRAMUSCULAR | Status: AC
  Filled 2016-01-20: qty 20

## 2016-01-20 MED ORDER — ONDANSETRON HCL 4 MG/2ML IJ SOLN
INTRAMUSCULAR | Status: DC | PRN
Start: 2016-01-20 — End: 2016-01-21
  Administered 2016-01-20: 4 mg via INTRAVENOUS

## 2016-01-20 MED ORDER — MIDAZOLAM HCL 5 MG/5ML IJ SOLN
INTRAMUSCULAR | Status: DC | PRN
  Administered 2016-01-20: 2 mg via INTRAVENOUS

## 2016-01-20 MED ORDER — 0.9 % SODIUM CHLORIDE (POUR BTL) OPTIME
TOPICAL | Status: DC | PRN
  Administered 2016-01-20: 1000 mL

## 2016-01-20 MED ORDER — MIDAZOLAM HCL 2 MG/2ML IJ SOLN
INTRAMUSCULAR | Status: AC
  Filled 2016-01-20: qty 2

## 2016-01-20 MED ORDER — FENTANYL CITRATE (PF) 100 MCG/2ML IJ SOLN
INTRAMUSCULAR | Status: AC
  Filled 2016-01-20: qty 4

## 2016-01-20 SURGICAL SUPPLY — 79 items
ANCH SUT KNTLS STRL SHLDR SYS (Anchor) ×4 IMPLANT
ANCHOR ALLTHRD KNTLS PEEK 5.5 (Anchor) ×2 IMPLANT
ANCHOR PEEK ALL THREAD (Anchor) ×6 IMPLANT
ANCHOR SUT QUATTRO KNTLS 4.5 (Anchor) ×8 IMPLANT
APL SKNCLS STERI-STRIP NONHPOA (GAUZE/BANDAGES/DRESSINGS) ×1
BANDAGE ACE 4X5 VEL STRL LF (GAUZE/BANDAGES/DRESSINGS) IMPLANT
BANDAGE ACE 6X5 VEL STRL LF (GAUZE/BANDAGES/DRESSINGS) IMPLANT
BENZOIN TINCTURE PRP APPL 2/3 (GAUZE/BANDAGES/DRESSINGS) ×3 IMPLANT
BIT DRILL 3.2 (BIT) ×3
BIT DRILL 3.2XCALB NS DISP (BIT) IMPLANT
BIT DRILL CALIBRATED 2.7 (BIT) ×1 IMPLANT
BIT DRILL CALIBRATED 2.7MM (BIT) ×1
BIT DRL 3.2XCALB NS DISP (BIT) ×1
BNDG COHESIVE 4X5 TAN STRL (GAUZE/BANDAGES/DRESSINGS) ×3 IMPLANT
CLOSURE STERI-STRIP 1/2X4 (GAUZE/BANDAGES/DRESSINGS) ×1
CLSR STERI-STRIP ANTIMIC 1/2X4 (GAUZE/BANDAGES/DRESSINGS) ×1 IMPLANT
COVER SURGICAL LIGHT HANDLE (MISCELLANEOUS) ×3 IMPLANT
DRAIN PENROSE 1/2X12 LTX STRL (WOUND CARE) IMPLANT
DRAPE C-ARM 42X72 X-RAY (DRAPES) IMPLANT
DRAPE IMP U-DRAPE 54X76 (DRAPES) ×3 IMPLANT
DRAPE U-SHAPE 47X51 STRL (DRAPES) ×3 IMPLANT
DRESSING AQUACEL AG SP 3.5X10 (GAUZE/BANDAGES/DRESSINGS) IMPLANT
DRSG AQUACEL AG SP 3.5X10 (GAUZE/BANDAGES/DRESSINGS) ×3
DRSG PAD ABDOMINAL 8X10 ST (GAUZE/BANDAGES/DRESSINGS) IMPLANT
DURAPREP 26ML APPLICATOR (WOUND CARE) ×3 IMPLANT
ELECT REM PT RETURN 9FT ADLT (ELECTROSURGICAL) ×3
ELECTRODE REM PT RTRN 9FT ADLT (ELECTROSURGICAL) ×1 IMPLANT
FACESHIELD WRAPAROUND (MASK) ×3 IMPLANT
GAUZE SPONGE 4X4 12PLY STRL (GAUZE/BANDAGES/DRESSINGS) IMPLANT
GAUZE XEROFORM 5X9 LF (GAUZE/BANDAGES/DRESSINGS) IMPLANT
GLOVE BIOGEL PI IND STRL 8 (GLOVE) ×1 IMPLANT
GLOVE BIOGEL PI IND STRL 9 (GLOVE) IMPLANT
GLOVE BIOGEL PI INDICATOR 8 (GLOVE) ×2
GLOVE BIOGEL PI INDICATOR 9 (GLOVE) ×2
GLOVE SURG ORTHO 8.0 STRL STRW (GLOVE) ×3 IMPLANT
GOWN STRL REUS W/ TWL LRG LVL3 (GOWN DISPOSABLE) ×2 IMPLANT
GOWN STRL REUS W/ TWL XL LVL3 (GOWN DISPOSABLE) ×1 IMPLANT
GOWN STRL REUS W/TWL LRG LVL3 (GOWN DISPOSABLE) ×6
GOWN STRL REUS W/TWL XL LVL3 (GOWN DISPOSABLE) ×3
KIT BASIN OR (CUSTOM PROCEDURE TRAY) ×3 IMPLANT
KIT ROOM TURNOVER OR (KITS) ×3 IMPLANT
MANIFOLD NEPTUNE II (INSTRUMENTS) ×3 IMPLANT
NDL SUT 6 .5 CRC .975X.05 MAYO (NEEDLE) IMPLANT
NEEDLE 21X1 OR PACK (NEEDLE) IMPLANT
NEEDLE MAYO TAPER (NEEDLE) ×3
NEEDLE SCORPION MULTI FIRE (NEEDLE) ×3 IMPLANT
NS IRRIG 1000ML POUR BTL (IV SOLUTION) ×3 IMPLANT
PACK SHOULDER (CUSTOM PROCEDURE TRAY) ×3 IMPLANT
PACK UNIVERSAL I (CUSTOM PROCEDURE TRAY) ×3 IMPLANT
PAD ARMBOARD 7.5X6 YLW CONV (MISCELLANEOUS) ×6 IMPLANT
PAD CAST 4YDX4 CTTN HI CHSV (CAST SUPPLIES) IMPLANT
PADDING CAST COTTON 4X4 STRL (CAST SUPPLIES)
PENCIL BUTTON HOLSTER BLD 10FT (ELECTRODE) IMPLANT
SPONGE LAP 4X18 X RAY DECT (DISPOSABLE) ×6 IMPLANT
STAPLER VISISTAT 35W (STAPLE) IMPLANT
STOCKINETTE IMPERVIOUS 9X36 MD (GAUZE/BANDAGES/DRESSINGS) IMPLANT
SUCTION FRAZIER HANDLE 10FR (MISCELLANEOUS)
SUCTION TUBE FRAZIER 10FR DISP (MISCELLANEOUS) IMPLANT
SUT ETHILON 3 0 PS 1 (SUTURE) IMPLANT
SUT FIBERWIRE #2 38 REV NDL BL (SUTURE) ×3
SUT FIBERWIRE 2-0 18 17.9 3/8 (SUTURE) ×3
SUT MAXBRAID (SUTURE) ×24 IMPLANT
SUT MNCRL AB 3-0 PS2 18 (SUTURE) ×3 IMPLANT
SUT VIC AB 0 CT1 27 (SUTURE) ×6
SUT VIC AB 0 CT1 27XBRD ANBCTR (SUTURE) ×2 IMPLANT
SUT VIC AB 1 CT1 27 (SUTURE) ×3
SUT VIC AB 1 CT1 27XBRD ANBCTR (SUTURE) IMPLANT
SUT VIC AB 2-0 CT1 27 (SUTURE) ×9
SUT VIC AB 2-0 CT1 TAPERPNT 27 (SUTURE) IMPLANT
SUT VIC AB 2-0 CTB1 (SUTURE) IMPLANT
SUT VICRYL 0 UR6 27IN ABS (SUTURE) ×2 IMPLANT
SUTURE FIBERWR 2-0 18 17.9 3/8 (SUTURE) IMPLANT
SUTURE FIBERWR#2 38 REV NDL BL (SUTURE) IMPLANT
TOWEL OR 17X24 6PK STRL BLUE (TOWEL DISPOSABLE) ×3 IMPLANT
TOWEL OR 17X26 10 PK STRL BLUE (TOWEL DISPOSABLE) ×3 IMPLANT
TUBE CONNECTING 12'X1/4 (SUCTIONS)
TUBE CONNECTING 12X1/4 (SUCTIONS) IMPLANT
WATER STERILE IRR 1000ML POUR (IV SOLUTION) ×3 IMPLANT
YANKAUER SUCT BULB TIP NO VENT (SUCTIONS) IMPLANT

## 2016-01-20 NOTE — Progress Notes (Signed)
PROGRESS NOTE  Yesenia [name] [id number] DOB: 1950-10-19 DOA: 01/12/2016 PCP: Vic Blackbird, MD   LOS: 8 days   Brief Narrative: Yesenia [name] is a 65 y.o. female with medical history significant of DM, hypothyrdoism, remote h/o DVT/PE, HTN, fibromylagia, and GERD presenting for her 4th ED visit in as many days following a fall, was diagnosed with bilateral humerus fracture. Orthopedic surgery consulted, s/p operative repair on 10/11.  Assessment & Plan: Principal Problem:   Humerus fracture Active Problems:   GERD   Essential hypertension, benign   Hypothyroidism   Morbid obesity (HCC)   Fibromyalgia   Type II diabetes mellitus with neurological manifestations (HCC)   CKD (chronic kidney disease), stage II   Hyperlipemia   Anemia   Proximal humerus fracture   Bilateral humerus fractures She has bilateral shoulder fracture   Patient seen by orthopedics Dr. Marlou Sa, status post OPEN REDUCTION INTERNAL FIXATION (ORIF) PROXIMAL HUMERUS FRACTURE 10/11 Plan or for Tuesday for left shoulder.  Comminuted fracture of the greater tuberosity and possibly part of the lesser tuberosity indicating significant rotator cuff pathology is noted on CT scan  will needs SNF , likely discharge next week after left shoulder surgery, patient says surgery is Monday   HTN  controlled, reduced dose of HCTZ given anticipated surgery next week and relatively low blood pressure   DM - A1c in 2/17 was 6.4 (and this is true since 2015), controlled Held oral medications, continue SSI   Hypothyroidism - TSH was elevated in 2/17 with normal T3 and free T4 - repeat TSH elevated at 4.8, normal free T4. No change in regimen, recheck TSH in 3-4 weeks as an outpatient  CKD II - Cr stable, recheck renal function tomorrow  GERD - Continue Dexilant and Zantac (formulary substitution)  HLD - Continue Zocor  Anemia Hemoglobin has been stable between 9.0-11 Monitor  closely   Fibromylagia - pain regimen as above for #1 klonopin for anxiety  Constipation 1 bowel movement, continue MiraLAX, continue Linzess Received relistor 10/14, patient had a BM yesterday with excellent results    DVT prophylaxis: Lovenox Code Status: Full Family Communication: d/w son at bedside Disposition Plan: Surgery on left shoulder today, anticipate discharge to SNF tomorrow   Consultants:   Orthopedic surgery, Dr. Marlou Sa   Procedures:   OPEN REDUCTION INTERNAL FIXATION (ORIF) PROXIMAL HUMERUS FRACTURE right   Antimicrobials:  None    Subjective: Anxiously waiting to have surgery on the left shoulder   Objective: Vitals:   01/19/16 0507 01/19/16 1300 01/19/16 1934 01/20/16 0509  BP: 129/70 129/60 (!) 117/47 (!) 130/57  Pulse: 73 83 79 69  Resp: 16  16 16   Temp: 97.4 F (36.3 C) 98.4 F (36.9 C) 97.6 F (36.4 C) 98.3 F (36.8 C)  TempSrc: Oral Oral Oral Oral  SpO2: 91% 95% 96% 93%  Weight:      Height:        Intake/Output Summary (Last 24 hours) at 01/20/16 1001 Last data filed at 01/19/16 1006  Gross per 24 hour  Intake               10 ml  Output                0 ml  Net               10 ml   Filed Weights   01/12/16 1419 01/13/16 0049  Weight: 119.7 kg (264 lb) 119.7 kg (263 lb 14.3 oz)  Examination: Constitutional: NAD Vitals:   01/19/16 0507 01/19/16 1300 01/19/16 1934 01/20/16 0509  BP: 129/70 129/60 (!) 117/47 (!) 130/57  Pulse: 73 83 79 69  Resp: 16  16 16   Temp: 97.4 F (36.3 C) 98.4 F (36.9 C) 97.6 F (36.4 C) 98.3 F (36.8 C)  TempSrc: Oral Oral Oral Oral  SpO2: 91% 95% 96% 93%  Weight:      Height:       Eyes: lids and conjunctivae normal ENMT: Mucous membranes are moist.  Respiratory: clear to auscultation bilaterally, no wheezing, no crackles.  Cardiovascular: Regular rate and rhythm, no murmurs / rubs / gallops. Abdomen: no tenderness. Bowel sounds positive.  Musculoskeletal: no clubbing / cyanosis.   Skin: no rashes, lesions, ulcers. No induration Neurologic: non focal   Data Reviewed: I have personally reviewed following labs and imaging studies  CBC:  Recent Labs Lab 01/14/16 0725 01/15/16 0001 01/16/16 0415 01/17/16 0446 01/20/16 0503  WBC 6.1 7.2 7.4 7.0 5.8  HGB 11.0* 10.3* 9.4* 10.1* 9.0*  HCT 36.1 33.2* 30.2* 32.4* 29.5*  MCV 93.8 91.0 90.1 92.0 92.2  PLT 195 214 240 222 [id number]   Basic Metabolic Panel:  Recent Labs Lab 01/14/16 0432 01/15/16 0001 01/16/16 0415 01/20/16 0503  NA 136  --  137 138  K 3.6  --  4.0 3.7  CL 100*  --  99* 102  CO2 26  --  32 29  GLUCOSE 97  --  105* 101*  BUN 9  --  9 11  CREATININE 0.97 1.07* 1.02* 0.87  CALCIUM 9.2  --  8.6* 9.1   GFR: Estimated Creatinine Clearance: 78 mL/min (by C-G formula based on SCr of 0.87 mg/dL). Liver Function Tests:  Recent Labs Lab 01/16/16 0415 01/20/16 0503  AST 38 16  ALT 13* 8*  ALKPHOS 50 49  BILITOT 0.8 0.8  PROT 4.8* 5.1*  ALBUMIN 2.5* 2.3*   No results for input(s): LIPASE, AMYLASE in the last 168 hours. No results for input(s): AMMONIA in the last 168 hours. Coagulation Profile: No results for input(s): INR, PROTIME in the last 168 hours. Cardiac Enzymes: No results for input(s): CKTOTAL, CKMB, CKMBINDEX, TROPONINI in the last 168 hours. BNP (last 3 results) No results for input(s): PROBNP in the last 8760 hours. HbA1C: No results for input(s): HGBA1C in the last 72 hours. CBG:  Recent Labs Lab 01/19/16 0606 01/19/16 1157 01/19/16 1620 01/19/16 2132 01/20/16 0525  GLUCAP 109* 121* 155* 93 86   Lipid Profile: No results for input(s): CHOL, HDL, LDLCALC, TRIG, CHOLHDL, LDLDIRECT in the last 72 hours. Thyroid Function Tests: No results for input(s): TSH, T4TOTAL, FREET4, T3FREE, THYROIDAB in the last 72 hours. Anemia Panel: No results for input(s): VITAMINB12, FOLATE, FERRITIN, TIBC, IRON, RETICCTPCT in the last 72 hours. Urine analysis:    Component Value  Date/Time   COLORURINE YELLOW 01/12/2016 1830   APPEARANCEUR CLEAR 01/12/2016 1830   LABSPEC 1.025 01/12/2016 1830   PHURINE 6.0 01/12/2016 1830   GLUCOSEU NEGATIVE 01/12/2016 1830   HGBUR NEGATIVE 01/12/2016 1830   BILIRUBINUR SMALL (A) 01/12/2016 1830   KETONESUR TRACE (A) 01/12/2016 1830   PROTEINUR NEGATIVE 01/12/2016 1830   UROBILINOGEN 0.2 12/30/2012 1300   NITRITE NEGATIVE 01/12/2016 1830   LEUKOCYTESUR NEGATIVE 01/12/2016 1830   Sepsis Labs: Invalid input(s): PROCALCITONIN, LACTICIDVEN  Recent Results (from the past 240 hour(s))  MRSA PCR Screening     Status: None   Collection Time: 01/12/16 11:11 PM  Result Value Ref  Range Status   MRSA by PCR NEGATIVE NEGATIVE Final    Comment:        The GeneXpert MRSA Assay (FDA approved for NASAL specimens only), is one component of a comprehensive MRSA colonization surveillance program. It is not intended to diagnose MRSA infection nor to guide or monitor treatment for MRSA infections.       Radiology Studies: No results found.   Scheduled Meds: . busPIRone  10 mg Oral TID  . docusate sodium  100 mg Oral BID  . famotidine  20 mg Oral Daily  . hydrochlorothiazide  12.5 mg Oral Daily  . insulin aspart  0-20 Units Subcutaneous TID WC  . levothyroxine  112 mcg Oral QAC breakfast  . linaclotide  145 mcg Oral QAC breakfast  . pantoprazole  40 mg Oral Daily  . polyethylene glycol  17 g Oral BID  . potassium chloride  10 mEq Oral Daily  . pregabalin  100 mg Oral TID  . simvastatin  40 mg Oral QHS  . traZODone  100 mg Oral QHS   Continuous Infusions:       [facility] Pager [phone]  [phone]  If 7PM-7AM, please contact night-coverage [email or web address] Password [email or web address] 01/20/2016, 10:01 AM

## 2016-01-20 NOTE — Progress Notes (Signed)
Patient doing reasonably well Plan for left shoulder fixation tonight Patient understands the risk and benefits.  Right shoulder is improved from preoperative status but still less than functional Will need rehabilitation

## 2016-01-20 NOTE — Anesthesia Procedure Notes (Signed)
Procedure Name: Intubation Date/Time: 01/20/2016 8:44 PM Performed by: Oletta Lamas Pre-anesthesia Checklist: Patient identified, Emergency Drugs available, Suction available and Patient being monitored Patient Re-evaluated:Patient Re-evaluated prior to inductionOxygen Delivery Method: Circle System Utilized Preoxygenation: Pre-oxygenation with 100% oxygen Intubation Type: IV induction Ventilation: Mask ventilation without difficulty Laryngoscope Size: Mac Tube type: Oral Tube size: 7.5 mm Number of attempts: 1 Airway Equipment and Method: Stylet Placement Confirmation: ETT inserted through vocal cords under direct vision,  positive ETCO2 and breath sounds checked- equal and bilateral Secured at: 22 cm Tube secured with: Tape Dental Injury: Teeth and Oropharynx as per pre-operative assessment

## 2016-01-20 NOTE — Anesthesia Preprocedure Evaluation (Addendum)
Anesthesia Evaluation  Patient identified by MRN, date of birth, ID band Patient awake    Reviewed: Allergy & Precautions, NPO status , Patient's Chart, lab work & pertinent test results  Airway Mallampati: I  TM Distance: >3 FB Neck ROM: Full    Dental  (+) Edentulous Upper, Edentulous Lower   Pulmonary sleep apnea ,    breath sounds clear to auscultation       Cardiovascular hypertension, Pt. on medications  Rhythm:Regular Rate:Normal     Neuro/Psych PSYCHIATRIC DISORDERS Anxiety Depression  Neuromuscular disease    GI/Hepatic hiatal hernia, GERD  Medicated,  Endo/Other  diabetes, Type 2, Oral Hypoglycemic AgentsHypothyroidism   Renal/GU   negative genitourinary   Musculoskeletal  (+) Arthritis , Osteoarthritis,  Fibromyalgia -  Abdominal   Peds negative pediatric ROS (+)  Hematology   Anesthesia Other Findings   Reproductive/Obstetrics                            Lab Results  Component Value Date   WBC 5.8 01/20/2016   HGB 9.0 (L) 01/20/2016   HCT 29.5 (L) 01/20/2016   MCV 92.2 01/20/2016   PLT 238 01/20/2016   Lab Results  Component Value Date   CREATININE 0.87 01/20/2016   BUN 11 01/20/2016   NA 138 01/20/2016   K 3.7 01/20/2016   CL 102 01/20/2016   CO2 29 01/20/2016   Lab Results  Component Value Date   INR 3.7 (H) 12/08/2013   INR 1.42 10/10/2013   INR 2.9 (H) 09/06/2013   01/2016 EKG: normal sinus rhythm.   Anesthesia Physical Anesthesia Plan  ASA: III  Anesthesia Plan: General   Post-op Pain Management:    Induction: Intravenous  Airway Management Planned: Oral ETT  Additional Equipment:   Intra-op Plan:   Post-operative Plan: Extubation in OR  Informed Consent: I have reviewed the patients History and Physical, chart, labs and discussed the procedure including the risks, benefits and alternatives for the proposed anesthesia with the patient or  authorized representative who has indicated his/her understanding and acceptance.   Dental advisory given  Plan Discussed with: CRNA  Anesthesia Plan Comments: (Pt has CVC catheter and sterile dressing over peripheral nerve block site. )       Anesthesia Quick Evaluation

## 2016-01-20 NOTE — Op Note (Signed)
NAME:  [name], [name] NO.:  [id number]  MEDICAL RECORD NO.:  [id number]  LOCATION:                               FACILITY:  [facility]  PHYSICIAN:  Anderson Malta, M.D.    DATE OF BIRTH:  1951/03/13  DATE OF PROCEDURE: DATE OF DISCHARGE:                              OPERATIVE REPORT   PREOPERATIVE DIAGNOSIS:  Right proximal humerus fracture, comminuted.  POSTOPERATIVE DIAGNOSIS:  Right proximal humerus fracture, comminuted.  PROCEDURE:  Right proximal humerus fracture open reduction and internal fixation.  SURGEON:  Anderson Malta, M.D.  ASSISTANT:  Dyke Brackett, RNFA.  INDICATIONS:  Yesenia [name] is a 65 year old patient, right shoulder pain, proximal humerus fracture, presents for operative management after explanation of risks and benefits.  PROCEDURE IN DETAIL:  The patient was brought to the operating room, where general anesthetic was induced.  Preoperative antibiotics were administered.  Time-out was called.  Right arm was prescrubbed with alcohol and Betadine, allowed to air dry, prepped with DuraPrep solution, draped in a sterile manner.  [name] was used to cover the entire operative field.  Time-out was called.  Bilateral SCDs were placed.  An incision was made from the coracoid process extending distally to the midportion of the proximal humerus anteriorly.  Skin and subcutaneous tissue were sharply divided.  The cephalic vein was identified and mobilized medially.  Deltopectoral interval was developed.  The self-retaining retractors were placed.  Fracture was identified.  It was a comminuted fracture with apex anterior angulation. Part of the deltoid was detached in order to allow for plate placement. Fracture ends were cleaned in order to facilitate reduction.  Two fiber wires were placed circumferentially around the shaft taking care to avoid any injury to the radial nerve.  This was done primarily proximally and slightly distal to the metaphysis in  order to facilitate reduction.  The bicipital groove was used to line up the fracture fragments.  The 2 metaphyseal fragments were split and could not be lagged into position.  Biomet plate was applied and fixed with K-wires proximally and distally.  Correct location confirmed in the AP and lateral planes.  Screws were then placed distally on the shaft and then proximally pegs were placed in order to achieve fixation.  Overall, good fixation was achieved in both the head and in the shaft.  Metaphyseal region remained stabilized.  Fragments were then carefully sutured into place using FiberWire in a cable-type manner again taking care to remain on the bone.  Concept Suture Passer was utilized for this.  At this time, 10 cortices were achieved distally and all screws placed proximally into the head.  All pegs placed proximally into the head. Thorough irrigation performed with 6 liters of irrigating solution. Bone putty graft was then applied to the metaphyseal region.  The deltopectoral interval was then closed using #1 Vicryl suture followed by interrupted inverted 0 Vicryl suture, 2-0 Vicryl suture, and 3-0 Monocryl.  Aquacel dressing placed.  Shoulder immobilizer placed.  The patient tolerated the procedure well without immediate complication, transferred to the recovery room in stable condition.     Anderson Malta, M.D.     GSD/MEDQ  D:  01/15/2016  T:  01/16/2016  Job:  [id number]

## 2016-01-20 NOTE — Progress Notes (Signed)
Physical Therapy Treatment Patient Details Name: Yesenia [name] MRN: [id number] DOB: 1950/08/01 Today's Date: 01/20/2016    History of Present Illness pt presents afters falls sustaining Bil Humeral fxs and is now s/p ORIF of R humeral fx and awaiting further treatment of L humeral fx.  pt with hx of DM, HTN, DVT, and Fibromyalgia.      PT Comments    Pt performed supine therapeutic exercises in lieu of OOB mobility.  Pt reports she has just had ativan and wished to remain in bed.  Pt agreeable to supine exercises.  Pt surgery has been moved back to 5pm today.  Will f/u in am for OOB mobility.    Follow Up Recommendations  SNF     Equipment Recommendations  None recommended by PT    Recommendations for Other Services       Precautions / Restrictions Precautions Precautions: Fall;Shoulder Type of Shoulder Precautions: conservative - AROM at the elbow, wrist and hand only Shoulder Interventions: Shoulder sling/immobilizer;At all times Precaution Booklet Issued: Yes (comment) Precaution Comments: shoulder d/c handout issued Restrictions Weight Bearing Restrictions: Yes RUE Weight Bearing: Non weight bearing LUE Weight Bearing: Non weight bearing    Mobility  Bed Mobility               General bed mobility comments: Pt refused to sit edge of bed after taking ativan, repositioned and boosted patient in bed with +2 total assistance.    Transfers Overall transfer level:  (refused today.  )                  Ambulation/Gait                 Stairs            Wheelchair Mobility    Modified Rankin (Stroke Patients Only)       Balance                                    Cognition Arousal/Alertness: Awake/alert Behavior During Therapy: WFL for tasks assessed/performed Overall Cognitive Status: Within Functional Limits for tasks assessed                      Exercises Total Joint Exercises Towel Squeeze:  AROM;Both;15 reps;Supine General Exercises - Lower Extremity Ankle Circles/Pumps: AROM;Both;15 reps;Supine Quad Sets: AROM;Both;15 reps;Supine Gluteal Sets: AROM;Both;15 reps;Supine Short Arc Quad: AROM;Both;15 reps;Supine Heel Slides: AROM;Both;15 reps;Supine Hip ABduction/ADduction: AROM;Both;15 reps;Supine Straight Leg Raises: AROM;Both;15 reps;Supine    General Comments        Pertinent Vitals/Pain Pain Assessment: 0-10 Pain Score: 9  Pain Location: BUEs Pain Descriptors / Indicators: Operative site guarding;Discomfort;Shooting Pain Intervention(s): Monitored during session;Repositioned    Home Living                      Prior Function            PT Goals (current goals can now be found in the care plan section) Acute Rehab PT Goals Patient Stated Goal: have surgery today.   Potential to Achieve Goals: Good Progress towards PT goals: Progressing toward goals    Frequency    Min 3X/week      PT Plan Current plan remains appropriate    Co-evaluation             End of Session   Activity Tolerance: Patient limited by fatigue  Patient left: in bed;with call bell/phone within reach     Time: 1258-1316 PT Time Calculation (min) (ACUTE ONLY): 18 min  Charges:  $Therapeutic Exercise: 8-22 mins                    G Codes:      Cristela Blue February 01, 2016, 1:21 PM  Governor Rooks, PTA pager [phone]

## 2016-01-20 NOTE — Care Management Important Message (Signed)
Important Message  Patient Details  Name: Yesenia [name] MRN: [id number] Date of Birth: Aug 20, 1950   Medicare Important Message Given:  Yes    Nathen May 01/20/2016, 1:57 PM

## 2016-01-20 NOTE — Progress Notes (Signed)
PT Cancellation Note  Patient Details Name: Yesenia [name] MRN: [id number] DOB: February 09, 1951   Cancelled Treatment:    Reason Eval/Treat Not Completed: Patient at procedure or test/unavailable (CNA wiping patient down with CHG wipes for add on surgery today.  Will f/u if time permits and patient is alert.  Most likely with f/u in am.  )   [name] Eli Hose 01/20/2016, 9:24 AM Governor Rooks, PTA pager [phone]

## 2016-01-20 NOTE — Brief Op Note (Signed)
01/12/2016 - 01/20/2016  11:44 PM  PATIENT:  Yesenia [name]  65 y.o. female  PRE-OPERATIVE DIAGNOSIS:  left proximal humerus fracture  POST-OPERATIVE DIAGNOSIS:  Left Proximal Humerus fracture  PROCEDURE:  Procedure(s): OPEN REDUCTION INTERNAL FIXATION (ORIF) PROXIMAL HUMERUS FRACTURE  SURGEON:  Surgeon(s): Meredith Pel, MD  ASSISTANT: Laure Kidney rnfa  ANESTHESIA:   general  EBL: 175 ml    Total I/O In: 1000 [I.V.:1000] Out: 350 [Urine:150; Blood:200]  BLOOD ADMINISTERED: none  DRAINS: none   LOCAL MEDICATIONS USED:  none  SPECIMEN:  No Specimen  COUNTS:  YES  TOURNIQUET:  * No tourniquets in log *  DICTATION: .Other Dictation: Dictation Number done  PLAN OF CARE: Admit to inpatient   PATIENT DISPOSITION:  PACU - hemodynamically stable

## 2016-01-21 ENCOUNTER — Inpatient Hospital Stay
Admission: RE | Admit: 2016-01-21 | Discharge: 2016-02-04 | Disposition: A | Discharge status: Home / Self Care | Payer: Medicare Other | Source: Ambulatory Visit | Attending: Internal Medicine | Admitting: Internal Medicine

## 2016-01-21 ENCOUNTER — Encounter (HOSPITAL_COMMUNITY): Payer: Self-pay | Admitting: *Deleted

## 2016-01-21 ENCOUNTER — Inpatient Hospital Stay (HOSPITAL_COMMUNITY): Payer: Medicare Other

## 2016-01-21 DIAGNOSIS — Z23 Encounter for immunization: Secondary | ICD-10-CM | POA: Diagnosis not present

## 2016-01-21 DIAGNOSIS — S42351A Displaced comminuted fracture of shaft of humerus, right arm, initial encounter for closed fracture: Secondary | ICD-10-CM | POA: Diagnosis present

## 2016-01-21 DIAGNOSIS — I10 Essential (primary) hypertension: Secondary | ICD-10-CM | POA: Diagnosis not present

## 2016-01-21 DIAGNOSIS — E1142 Type 2 diabetes mellitus with diabetic polyneuropathy: Secondary | ICD-10-CM | POA: Diagnosis present

## 2016-01-21 DIAGNOSIS — W010XXA Fall on same level from slipping, tripping and stumbling without subsequent striking against object, initial encounter: Secondary | ICD-10-CM | POA: Diagnosis present

## 2016-01-21 DIAGNOSIS — S42009A Fracture of unspecified part of unspecified clavicle, initial encounter for closed fracture: Secondary | ICD-10-CM | POA: Diagnosis not present

## 2016-01-21 DIAGNOSIS — R279 Unspecified lack of coordination: Secondary | ICD-10-CM | POA: Diagnosis not present

## 2016-01-21 DIAGNOSIS — S42212A Unspecified displaced fracture of surgical neck of left humerus, initial encounter for closed fracture: Secondary | ICD-10-CM

## 2016-01-21 DIAGNOSIS — E038 Other specified hypothyroidism: Secondary | ICD-10-CM | POA: Diagnosis not present

## 2016-01-21 DIAGNOSIS — D509 Iron deficiency anemia, unspecified: Secondary | ICD-10-CM | POA: Diagnosis not present

## 2016-01-21 DIAGNOSIS — Z7901 Long term (current) use of anticoagulants: Secondary | ICD-10-CM | POA: Diagnosis not present

## 2016-01-21 DIAGNOSIS — E039 Hypothyroidism, unspecified: Secondary | ICD-10-CM | POA: Diagnosis not present

## 2016-01-21 DIAGNOSIS — S42309A Unspecified fracture of shaft of humerus, unspecified arm, initial encounter for closed fracture: Secondary | ICD-10-CM | POA: Diagnosis not present

## 2016-01-21 DIAGNOSIS — S42255A Nondisplaced fracture of greater tuberosity of left humerus, initial encounter for closed fracture: Secondary | ICD-10-CM | POA: Diagnosis present

## 2016-01-21 DIAGNOSIS — K5790 Diverticulosis of intestine, part unspecified, without perforation or abscess without bleeding: Secondary | ICD-10-CM | POA: Diagnosis not present

## 2016-01-21 DIAGNOSIS — M6281 Muscle weakness (generalized): Secondary | ICD-10-CM | POA: Diagnosis not present

## 2016-01-21 DIAGNOSIS — Z86718 Personal history of other venous thrombosis and embolism: Secondary | ICD-10-CM | POA: Diagnosis not present

## 2016-01-21 DIAGNOSIS — E1122 Type 2 diabetes mellitus with diabetic chronic kidney disease: Secondary | ICD-10-CM | POA: Diagnosis present

## 2016-01-21 DIAGNOSIS — R262 Difficulty in walking, not elsewhere classified: Secondary | ICD-10-CM | POA: Diagnosis not present

## 2016-01-21 DIAGNOSIS — Z6841 Body Mass Index (BMI) 40.0 and over, adult: Secondary | ICD-10-CM | POA: Diagnosis not present

## 2016-01-21 DIAGNOSIS — E785 Hyperlipidemia, unspecified: Secondary | ICD-10-CM | POA: Diagnosis not present

## 2016-01-21 DIAGNOSIS — E1149 Type 2 diabetes mellitus with other diabetic neurological complication: Secondary | ICD-10-CM | POA: Diagnosis not present

## 2016-01-21 DIAGNOSIS — E662 Morbid (severe) obesity with alveolar hypoventilation: Secondary | ICD-10-CM | POA: Diagnosis present

## 2016-01-21 DIAGNOSIS — K219 Gastro-esophageal reflux disease without esophagitis: Secondary | ICD-10-CM | POA: Diagnosis present

## 2016-01-21 DIAGNOSIS — M66322 Spontaneous rupture of flexor tendons, left upper arm: Secondary | ICD-10-CM

## 2016-01-21 DIAGNOSIS — Z7984 Long term (current) use of oral hypoglycemic drugs: Secondary | ICD-10-CM | POA: Diagnosis not present

## 2016-01-21 DIAGNOSIS — S42209S Unspecified fracture of upper end of unspecified humerus, sequela: Secondary | ICD-10-CM | POA: Diagnosis not present

## 2016-01-21 DIAGNOSIS — M797 Fibromyalgia: Secondary | ICD-10-CM | POA: Diagnosis not present

## 2016-01-21 DIAGNOSIS — N182 Chronic kidney disease, stage 2 (mild): Secondary | ICD-10-CM | POA: Diagnosis not present

## 2016-01-21 DIAGNOSIS — E1140 Type 2 diabetes mellitus with diabetic neuropathy, unspecified: Secondary | ICD-10-CM | POA: Diagnosis not present

## 2016-01-21 DIAGNOSIS — I129 Hypertensive chronic kidney disease with stage 1 through stage 4 chronic kidney disease, or unspecified chronic kidney disease: Secondary | ICD-10-CM | POA: Diagnosis present

## 2016-01-21 DIAGNOSIS — S42291D Other displaced fracture of upper end of right humerus, subsequent encounter for fracture with routine healing: Secondary | ICD-10-CM | POA: Diagnosis not present

## 2016-01-21 DIAGNOSIS — K210 Gastro-esophageal reflux disease with esophagitis: Secondary | ICD-10-CM | POA: Diagnosis not present

## 2016-01-21 DIAGNOSIS — M25511 Pain in right shoulder: Secondary | ICD-10-CM | POA: Diagnosis present

## 2016-01-21 DIAGNOSIS — M48061 Spinal stenosis, lumbar region without neurogenic claudication: Secondary | ICD-10-CM | POA: Diagnosis not present

## 2016-01-21 DIAGNOSIS — S42252D Displaced fracture of greater tuberosity of left humerus, subsequent encounter for fracture with routine healing: Secondary | ICD-10-CM | POA: Diagnosis not present

## 2016-01-21 DIAGNOSIS — D649 Anemia, unspecified: Secondary | ICD-10-CM | POA: Diagnosis not present

## 2016-01-21 DIAGNOSIS — Z86711 Personal history of pulmonary embolism: Secondary | ICD-10-CM | POA: Diagnosis not present

## 2016-01-21 DIAGNOSIS — S42202A Unspecified fracture of upper end of left humerus, initial encounter for closed fracture: Secondary | ICD-10-CM | POA: Diagnosis not present

## 2016-01-21 DIAGNOSIS — K5900 Constipation, unspecified: Secondary | ICD-10-CM | POA: Diagnosis not present

## 2016-01-21 DIAGNOSIS — S42201A Unspecified fracture of upper end of right humerus, initial encounter for closed fracture: Secondary | ICD-10-CM | POA: Diagnosis not present

## 2016-01-21 LAB — GLUCOSE, CAPILLARY
GLUCOSE-CAPILLARY: 116 mg/dL — AB (ref 65–99)
Glucose-Capillary: 100 mg/dL — ABNORMAL HIGH (ref 65–99)
Glucose-Capillary: 119 mg/dL — ABNORMAL HIGH (ref 65–99)

## 2016-01-21 LAB — CREATININE, SERUM
Creatinine, Ser: 0.84 mg/dL (ref 0.44–1.00)
GFR calc Af Amer: >60 mL/min (ref >60)

## 2016-01-21 LAB — CBC
HEMATOCRIT: 28.7 % — AB (ref 36.0–46.0)
HEMOGLOBIN: 8.9 g/dL — AB (ref 12.0–15.0)
MCH: 28.2 pg (ref 26.0–34.0)
MCHC: 31 g/dL (ref 30.0–36.0)
MCV: 90.8 fL (ref 78.0–100.0)
Platelets: 259 10*3/uL (ref 150–400)
RBC: 3.16 MIL/uL — AB (ref 3.87–5.11)
RDW: 16 % — ABNORMAL HIGH (ref 11.5–15.5)
WBC: 7.6 10*3/uL (ref 4.0–10.5)

## 2016-01-21 MED ORDER — MENTHOL 3 MG MT LOZG: 1.0000 | LOZENGE | OROMUCOSAL | Status: DC | PRN

## 2016-01-21 MED ORDER — HYDROMORPHONE HCL 1 MG/ML IJ SOLN
INTRAMUSCULAR | Status: AC
  Filled 2016-01-21: qty 1

## 2016-01-21 MED ORDER — ENOXAPARIN SODIUM 40 MG/0.4ML ~~LOC~~ SOLN: 40.0000 mg | SUBCUTANEOUS | 0 refills | Status: DC

## 2016-01-21 MED ORDER — HYDROMORPHONE HCL 2 MG/ML IJ SOLN
1.0000 mg | INTRAMUSCULAR | Status: DC | PRN
  Filled 2016-01-21: qty 1

## 2016-01-21 MED ORDER — ENOXAPARIN SODIUM 40 MG/0.4ML ~~LOC~~ SOLN: 40.0000 mg | SUBCUTANEOUS | Status: DC

## 2016-01-21 MED ORDER — FENTANYL CITRATE (PF) 100 MCG/2ML IJ SOLN
INTRAMUSCULAR | Status: AC
Start: 2016-01-21 — End: 2016-01-21
  Filled 2016-01-21: qty 2

## 2016-01-21 MED ORDER — CEFAZOLIN SODIUM-DEXTROSE 2-4 GM/100ML-% IV SOLN
2.0000 g | Freq: Four times a day (QID) | INTRAVENOUS | Status: AC
  Administered 2016-01-21 (×2): 2 g via INTRAVENOUS
  Filled 2016-01-21 (×2): qty 100

## 2016-01-21 MED ORDER — ONDANSETRON HCL 4 MG PO TABS
4.0000 mg | ORAL_TABLET | Freq: Four times a day (QID) | ORAL | Status: DC | PRN
  Administered 2016-01-21: 4 mg via ORAL
  Filled 2016-01-21: qty 1

## 2016-01-21 MED ORDER — POTASSIUM CHLORIDE IN NACL 20-0.9 MEQ/L-% IV SOLN
INTRAVENOUS | Status: AC
  Administered 2016-01-21: 03:00:00 via INTRAVENOUS
  Filled 2016-01-21: qty 1000

## 2016-01-21 MED ORDER — POLYETHYLENE GLYCOL 3350 17 G PO PACK: 17.0000 g | PACK | Freq: Two times a day (BID) | ORAL | 0 refills | Status: DC

## 2016-01-21 MED ORDER — METHOCARBAMOL 500 MG PO TABS: 500.0000 mg | ORAL_TABLET | Freq: Four times a day (QID) | ORAL | 0 refills | Status: DC | PRN

## 2016-01-21 MED ORDER — HYDROMORPHONE HCL 1 MG/ML IJ SOLN
INTRAMUSCULAR | Status: AC
  Administered 2016-01-21: 1 mg
  Filled 2016-01-21: qty 1

## 2016-01-21 MED ORDER — FENTANYL CITRATE (PF) 100 MCG/2ML IJ SOLN
50.0000 ug | INTRAMUSCULAR | Status: DC | PRN
  Administered 2016-01-21: 50 ug via INTRAVENOUS

## 2016-01-21 MED ORDER — ACETAMINOPHEN 325 MG PO TABS
650.0000 mg | ORAL_TABLET | Freq: Four times a day (QID) | ORAL | Status: DC | PRN
  Administered 2016-01-21: 650 mg via ORAL
  Filled 2016-01-21: qty 2

## 2016-01-21 MED ORDER — METOCLOPRAMIDE HCL 5 MG PO TABS: 5.0000 mg | ORAL_TABLET | Freq: Three times a day (TID) | ORAL | Status: DC | PRN

## 2016-01-21 MED ORDER — ACETAMINOPHEN 650 MG RE SUPP: 650.0000 mg | Freq: Four times a day (QID) | RECTAL | Status: DC | PRN

## 2016-01-21 MED ORDER — ACETAMINOPHEN 10 MG/ML IV SOLN
INTRAVENOUS | Status: AC
  Filled 2016-01-21: qty 100

## 2016-01-21 MED ORDER — HYDROMORPHONE HCL 2 MG PO TABS: 2.0000 mg | ORAL_TABLET | ORAL | 0 refills | Status: DC | PRN

## 2016-01-21 MED ORDER — ACETAMINOPHEN 10 MG/ML IV SOLN: 1000.0000 mg | Freq: Once | INTRAVENOUS | Status: DC

## 2016-01-21 MED ORDER — DOCUSATE SODIUM 100 MG PO CAPS
100.0000 mg | ORAL_CAPSULE | Freq: Two times a day (BID) | ORAL | 0 refills | Status: DC
Start: 2016-01-21 — End: 2016-02-03

## 2016-01-21 MED ORDER — PHENOL 1.4 % MT LIQD: 1.0000 | OROMUCOSAL | Status: DC | PRN

## 2016-01-21 MED ORDER — METOCLOPRAMIDE HCL 5 MG/ML IJ SOLN: 5.0000 mg | Freq: Three times a day (TID) | INTRAMUSCULAR | Status: DC | PRN

## 2016-01-21 MED ORDER — BISACODYL 10 MG RE SUPP: 10.0000 mg | Freq: Every day | RECTAL | 0 refills | Status: DC | PRN

## 2016-01-21 MED ORDER — ONDANSETRON HCL 4 MG/2ML IJ SOLN: 4.0000 mg | Freq: Four times a day (QID) | INTRAMUSCULAR | Status: DC | PRN

## 2016-01-21 NOTE — Consult Note (Signed)
   [facility] Inpatient Consult   01/21/2016  Yesenia [name] [birth year] [id number]   [facility] Care Management follow up. Chart reviewed. Noted patient to go to SNF today. [facility] Care Management not appropriate at this time. Will alert [place] Management office.   Marthenia Rolling, MSN-Ed, RN,BSN [facility] Liaison [phone]

## 2016-01-21 NOTE — Anesthesia Postprocedure Evaluation (Signed)
Anesthesia Post Note  Patient: Yesenia [name]  Procedure(s) Performed: Procedure(s) (LRB): OPEN REDUCTION INTERNAL FIXATION (ORIF) PROXIMAL HUMERUS FRACTURE (Left)  Patient location during evaluation: PACU Anesthesia Type: General Level of consciousness: awake and alert Pain management: pain level controlled Vital Signs Assessment: post-procedure vital signs reviewed and stable Respiratory status: spontaneous breathing, nonlabored ventilation, respiratory function stable and patient connected to nasal cannula oxygen Cardiovascular status: blood pressure returned to baseline and stable Postop Assessment: no signs of nausea or vomiting Anesthetic complications: no    Last Vitals:  Vitals:   01/21/16 0155 01/21/16 0442  BP: (!) 148/74 116/70  Pulse: 83 79  Resp: 14 15  Temp: 36.9 C 37.2 C    Last Pain:  Vitals:   01/21/16 0730  TempSrc:   PainSc: [name]

## 2016-01-21 NOTE — Progress Notes (Signed)
SW reached out to [facility] and spoke with Tammi/Admissions and informed her that pt is up for discharge. Tammi states that the pt is welcomed to come.   SW informed pt, and son that the pt will be discharging to facility. Patient nor son have any questions at this time and expressed understanding. Son made aware that pt will be transported by [facility].   SW made nurse aware that Corey Harold has been called to transport patient to [facility].  [facility]/Nurse Report Number: [phone]  Tilda Burrow, MSW [phone] 01/21/2016 2:54 PM

## 2016-01-21 NOTE — Progress Notes (Signed)
OT Cancellation Note  Patient Details Name: Yesenia [name] MRN: [id number] DOB: 02-16-1951   Cancelled Treatment:    Reason Eval/Treat Not Completed: Patient at procedure or test/ unavailable;Other (comment) (Pt being lifted to transport to go to SNF). Wished Pt best of luck. Pt expressed thanks.  Merri Ray Clelia Trabucco 01/21/2016, 3:39 PM  Hulda Humphrey OTR/L [phone]

## 2016-01-21 NOTE — OR Nursing (Signed)
Radiologist approves tx to floor.

## 2016-01-21 NOTE — Progress Notes (Signed)
SW spoke with patient and son a bedside. Both confirm that they still want [facility] for SNF upon discharge. SW asked patient and son if they had any questions. They state that they have no questions at this time.  SW reached out to [facility] and spoke with admissions/Tammi and informed her that upon discharge pt still intends to come to her facility. Admissions expressed no concerns.   Tilda Burrow, MSW [phone] 01/21/2016 11:07 AM

## 2016-01-21 NOTE — Progress Notes (Signed)
Physical Therapy Treatment Patient Details Name: Yesenia [name] MRN: [id number] DOB: 04-12-1950 Today's Date: 01/21/2016    History of Present Illness pt presents afters falls sustaining Bil Humeral fxs and is now s/p ORIF of R, s/p ORIF L humeral fx on 01/20/16.  pt with hx of DM, HTN, DVT, and Fibromyalgia.      PT Comments    Pt very painful this am but willing to get OOB; Pt is cooperative, requires incr time for all tasks d/t pain;  pt reports she has not consistently had assist with feeding, discussed shoulder precautions and need for assist with NT; continue to recommend SNF  Follow Up Recommendations  SNF     Equipment Recommendations  None recommended by PT    Recommendations for Other Services       Precautions / Restrictions Precautions Precautions: Fall;Shoulder Type of Shoulder Precautions: conservative - AROM at the elbow, wrist and hand only--bil UEs Shoulder Interventions: Shoulder sling/immobilizer;At all times Required Braces or Orthoses:  (bil UEs) Restrictions RUE Weight Bearing: Non weight bearing LUE Weight Bearing: Non weight bearing    Mobility  Bed Mobility Overal bed mobility: Needs Assistance Bed Mobility: Supine to Sit     Supine to sit: Mod assist;HOB elevated     General bed mobility comments: assist with trunk, incr time and verbal cues for using LEs to self assist scooting  Transfers Overall transfer level: Needs assistance Equipment used: None Transfers: Sit to/from [employer] Transfers Sit to Stand: Min guard Stand pivot transfers: Min guard       General transfer comment: incr time, cues to control descent  Ambulation/Gait             General Gait Details: deferred d/t pain level   Stairs            Wheelchair Mobility    Modified Rankin (Stroke Patients Only)       Balance   Sitting-balance support: No upper extremity supported;Feet supported Sitting balance-Leahy Scale: Fair      Standing balance support: No upper extremity supported;During functional activity Standing balance-Leahy Scale: Fair                      Cognition Arousal/Alertness: Awake/alert Behavior During Therapy: WFL for tasks assessed/performed;Flat affect Overall Cognitive Status: Within Functional Limits for tasks assessed                      Exercises General Exercises - Lower Extremity Ankle Circles/Pumps: AROM;Both;15 reps;Supine Other Exercises Other Exercises: encouraged digit and wrist exercises.  R hand/digits edematous    General Comments        Pertinent Vitals/Pain Pain Assessment: 0-10 Pain Score: 8  Pain Location: L>R Pain Descriptors / Indicators: Constant;Operative site guarding;Grimacing;Sore Pain Intervention(s): Limited activity within patient's tolerance;Monitored during session;Premedicated before session;Repositioned    Home Living                      Prior Function            PT Goals (current goals can now be found in the care plan section) Acute Rehab PT Goals Patient Stated Goal: get better PT Goal Formulation: With patient Time For Goal Achievement: 01/30/16 Potential to Achieve Goals: Good Progress towards PT goals: Progressing toward goals    Frequency    Min 3X/week      PT Plan Current plan remains appropriate    Co-evaluation  End of Session Equipment Utilized During Treatment: Other (comment) (bil shoulder slings) Activity Tolerance: Patient tolerated treatment well;Patient limited by pain Patient left: in chair;with call bell/phone within reach     Time: [id number] PT Time Calculation (min) (ACUTE ONLY): 29 min  Charges:  $Therapeutic Activity: 23-37 mins                    G Codes:      Yesenia [name] 2016-01-26, 9:34 AM

## 2016-01-21 NOTE — Op Note (Signed)
NAMESUNDY, MAIDONADO NO.:  [id number]  MEDICAL RECORD NO.:  [id number]  LOCATION:  [id number]                        FACILITY:  [facility]  PHYSICIAN:  Anderson Malta, M.D.    DATE OF BIRTH:  1950-05-10  DATE OF PROCEDURE: DATE OF DISCHARGE:  01/21/2016                              OPERATIVE REPORT   PREOPERATIVE DIAGNOSIS:  Left proximal humerus fracture.  POSTOPERATIVE DIAGNOSIS:  Left proximal humerus fracture.  PROCEDURE:  Left proximal humerus fracture open reduction and internal fixation with repair of tuberosity fragments and repair of rotator cuff.  SURGEON:  Anderson Malta, M.D.  ASSISTANT:  Laure Kidney, RNFA.  INDICATIONS:  Yesenia [name] is a 65 year old female with left shoulder pain.  She had a shoulder dislocation with tuberosity fragmentation and rotator cuff  tearing with the tuberosity fractures.  She presents now for operative management after explanation of risks and benefits.  PROCEDURE IN DETAIL:  The patient was brought to the operating room where general anesthetic was induced.  Preoperative antibiotics were administered.  Time-out was called.  The patient was placed in a beach- chair position with the head in neutral position.  Left arm was prescrubbed with alcohol and Betadine to the hand, and then prepped with DuraPrep solution and draped in a sterile manner.  [name] was used to cover the entire operative field.  Time-out called.  Deltopectoral approach was made.  Skin and subcu tissues were sharply divided.  The cephalic vein mobilized laterally.  The fracture was identified. Mobilization of the deltoid off the proximal humerus was performed.  The fracture was visualized.  Beginning anteriorly and moving posteriorly, MaxBraid sutures were placed in order to mobilize the cuff attached to the fracture tuberosity, potentially a shear type fracture of the humeral head.  Following mobilization of the tissue with 8 MaxBraid sutures, the  rotator cuff was mobilized.  The biceps tendon was then released and tenodesed to the pectoralis tendon.  This was done also with 3 simple MaxBraid sutures.  Two Biomet corkscrew anchors were placed into the head, and from these corkscrew anchors, 6 sutures were passed and tied and these sutures were then crossed, 1 limb of each of these MaxBraid sutures were then placed into a Biomet PushLock equivalent into the humeral shaft.  Shaft of bone was of marginal quality, but we did achieve a purchase.  The central 4 MaxBraid sutures were then tacked also in a different level into the humeral shaft.  This gave a pretty reasonable fixation.  At this time, thorough irrigation was performed.  Deltopectoral interval was closed using 0 Vicryl suture, followed by interrupted inverted 2-0 Vicryl suture and a 3-0 Monocryl.  Aquacel dressing placed.  The arm was taken through a range of motion and found to have pretty good motion. The repair was stable.  However, because of bone quality, we will go very slow with her rehab.     Anderson Malta, M.D.     GSD/MEDQ  D:  01/20/2016  T:  01/21/2016  Job:  [id number]

## 2016-01-21 NOTE — Progress Notes (Signed)
Attempted to call report to [facility] [phone]. No answer. Will attempt to call back in 10 minutes.

## 2016-01-21 NOTE — Discharge Summary (Signed)
Physician Discharge Summary  Yesenia [name] MRN: [id number] DOB/AGE: 11-11-1950 65 y.o.  PCP: Vic Blackbird, MD   Admit date: 01/12/2016 Discharge date: 01/21/2016  Discharge Diagnoses:    Principal Problem:   Humerus fracture Active Problems:   GERD   Essential hypertension, benign   Hypothyroidism   Morbid obesity ([place])   Fibromyalgia   Type II diabetes mellitus with neurological manifestations (HCC)   CKD (chronic kidney disease), stage II   Hyperlipemia   Anemia   Proximal humerus fracture    Follow-up recommendations Follow-up with PCP in 3-5 days , including all  additional recommended appointments as below Follow-up CBC, CMP in 3-5 days Patient to follow-up with Meredith Pel, MD, in one week, physical therapy orders as per instructed by Meredith Pel, MD Closely follow renal function, hemoglobin       Current Discharge Medication List    START taking these medications   Details  bisacodyl (DULCOLAX) 10 MG suppository Place 1 suppository (10 mg total) rectally daily as needed for moderate constipation. Qty: 12 suppository, Refills: 0    docusate sodium (COLACE) 100 MG capsule Take 1 capsule (100 mg total) by mouth 2 (two) times daily. Qty: 10 capsule, Refills: 0    enoxaparin (LOVENOX) 40 MG/0.4ML injection Inject 0.4 mLs (40 mg total) into the skin daily. Qty: 20 Syringe, Refills: 0    methocarbamol (ROBAXIN) 500 MG tablet Take 1 tablet (500 mg total) by mouth every 6 (six) hours as needed for muscle spasms. Qty: 30 tablet, Refills: 0    polyethylene glycol (MIRALAX / GLYCOLAX) packet Take 17 g by mouth 2 (two) times daily. Qty: 14 each, Refills: 0      CONTINUE these medications which have CHANGED   Details  HYDROmorphone (DILAUDID) 2 MG tablet Take 1 tablet (2 mg total) by mouth every 4 (four) hours as needed for severe pain. Qty: 30 tablet, Refills: 0      CONTINUE these medications which have NOT CHANGED   Details  busPIRone  (BUSPAR) 10 MG tablet Take 1 tablet by mouth 3 (three) times daily.    dexlansoprazole (DEXILANT) 60 MG capsule Take 1 capsule (60 mg total) by mouth daily. Qty: 30 capsule, Refills: 3    hydrochlorothiazide (HYDRODIURIL) 25 MG tablet TAKE (1) TABLET BY MOUTH ONCE DAILY. Qty: 30 tablet, Refills: 3    levothyroxine (SYNTHROID, LEVOTHROID) 112 MCG tablet TAKE 1 TABLET ONCE DAILY. Qty: 30 tablet, Refills: 0    linaclotide (LINZESS) 72 MCG capsule Take 1 capsule (72 mcg total) by mouth daily before breakfast. Qty: 30 capsule, Refills: 11    meclizine (ANTIVERT) 12.5 MG tablet TAKE (1) TABLET THREE TIMES DAILY AS NEEDED FOR DIZZINESS. Qty: 30 tablet, Refills: 0    metFORMIN (GLUCOPHAGE-XR) 500 MG 24 hr tablet TAKE (1) TABLET TWICE A DAY WITH MEALS. Qty: 60 tablet, Refills: 3    potassium chloride (K-DUR) 10 MEQ tablet TAKE (1) TABLET BY MOUTH ONCE DAILY. Qty: 30 tablet, Refills: 0    pregabalin (LYRICA) 100 MG capsule Take 1 capsule (100 mg total) by mouth 3 (three) times daily. Qty: 90 capsule, Refills: 3    ranitidine (ZANTAC) 150 MG tablet TAKE 1 TABLET BY MOUTH AT BEDTIME. Qty: 30 tablet, Refills: 11    simvastatin (ZOCOR) 40 MG tablet TAKE 1 TABLET BY MOUTH AT BEDTIME. Qty: 30 tablet, Refills: 2    traZODone (DESYREL) 100 MG tablet Take 100 mg by mouth at bedtime.  Discharge Condition: *Stable   Discharge Instructions Get Medicines reviewed and adjusted: Please take all your medications with you for your next visit with your Primary MD  Please request your Primary MD to go over all hospital tests and procedure/radiological results at the follow up, please ask your Primary MD to get all Hospital records sent to his/her office.  If you experience worsening of your admission symptoms, develop shortness of breath, life threatening emergency, suicidal or homicidal thoughts you must seek medical attention immediately by calling 911 or calling your MD immediately if  symptoms less severe.  You must read complete instructions/literature along with all the possible adverse reactions/side effects for all the Medicines you take and that have been prescribed to you. Take any new Medicines after you have completely understood and accpet all the possible adverse reactions/side effects.   Do not drive when taking Pain medications.   Do not take more than prescribed Pain, Sleep and Anxiety Medications  Special Instructions: If you have smoked or chewed Tobacco in the last 2 yrs please stop smoking, stop any regular Alcohol and or any Recreational drug use.  Wear Seat belts while driving.  Please note  You were cared for by a hospitalist during your hospital stay. Once you are discharged, your primary care physician will handle any further medical issues. Please note that NO REFILLS for any discharge medications will be authorized once you are discharged, as it is imperative that you return to your primary care physician (or establish a relationship with a primary care physician if you do not have one) for your aftercare needs so that they can reassess your need for medications and monitor your lab values.  Discharge Instructions    Diet - low sodium heart healthy    Complete by:  As directed    Increase activity slowly    Complete by:  As directed        Allergies  Allergen Reactions  . Morphine Anaphylaxis and Other (See Comments)    Caused patient to CODE .  [name] Aspirin Other (See Comments)    G.I. Upset   . Nalbuphine Itching, Swelling and Rash      Disposition: SNF   Consults:  Orthopedics     Significant Diagnostic Studies:  Dg Shoulder Right  Result Date: 01/12/2016 CLINICAL DATA:  Patient presents for pain management an admission. EXAM: RIGHT SHOULDER - 2+ VIEW COMPARISON:  None. FINDINGS: There is an inferior dislocation of the right humeral head on the glenoid, with mild posterior component. There is a comminuted impacted fracture  of the proximal right humeral shaft with mild anterior displacement of the distal fracture fragment. IMPRESSION: Comminuted impacted fracture of the proximal right humeral shaft. Inferior dislocation of the right humeral head in relation to the glenoid with mild posterior component. Electronically Signed   By: Fidela Salisbury M.D.   On: 01/12/2016 16:08   Dg Shoulder Right  Result Date: 01/09/2016 CLINICAL DATA:  Injury. EXAM: RIGHT SHOULDER - 2+ VIEW COMPARISON:  CT 10/10/2013. FINDINGS: Angulated comminuted fracture the proximal right humerus. Acromioclavicular glenohumeral degenerative change with subacromial spurring. Diffuse osteopenia. IMPRESSION: 1.  Angulated comminuted fracture of the proximal right humerus. 2. Degenerative changes right shoulder. Electronically Signed   By: Marcello Moores  Register   On: 01/09/2016 12:53   Dg Elbow 2 Views Left  Result Date: 01/09/2016 CLINICAL DATA:  LEFT AND RIGHT ELBOW PAIN S/P TRIPPING AND FALLING FORWARD ONTO THE FLOOR TODAY EXAM: LEFT ELBOW - 2 VIEW COMPARISON:  None. FINDINGS: No fracture. No dislocation. Elbow joint normally spaced and aligned. No convincing joint effusion. Soft tissues are unremarkable. IMPRESSION: Negative. Electronically Signed   By: Lajean Manes M.D.   On: 01/09/2016 15:12   Dg Elbow 2 Views Right  Result Date: 01/09/2016 CLINICAL DATA:  LEFT AND RIGHT ELBOW PAIN S/P TRIPPING AND FALLING FORWARD ONTO THE FLOOR TODAY EXAM: RIGHT ELBOW - 2 VIEW COMPARISON:  None. FINDINGS: No fracture. No dislocation. No significant joint abnormality. No joint effusion. Soft tissues are unremarkable. IMPRESSION: Negative. Electronically Signed   By: Lajean Manes M.D.   On: 01/09/2016 15:13   Dg Wrist Complete Left  Result Date: 01/09/2016 CLINICAL DATA:  Pain following fall EXAM: LEFT WRIST - COMPLETE 3+ VIEW COMPARISON:  None. FINDINGS: Frontal, oblique, lateral, and ulnar deviation scaphoid images were obtained. There is no fracture or  dislocation. There is extensive osteoarthritic change in the first carpal -metacarpal joint. There is milder osteoarthritic change in the scaphotrapezial joint. No erosive change. There is probable myositis ossificans lateral to the mid radius. IMPRESSION: Osteoarthritic change laterally, most notably in the first carpal-metacarpal joint but also involving the scaphotrapezial joint. No acute fracture or dislocation. Electronically Signed   By: Lowella Grip III M.D.   On: 01/09/2016 12:56   Dg Wrist Complete Right  Result Date: 01/09/2016 CLINICAL DATA:  Pain following fall EXAM: RIGHT WRIST - COMPLETE 3+ VIEW COMPARISON:  None. FINDINGS: Frontal, oblique, lateral, and ulnar deviation scaphoid images were obtained. There is no acute fracture or dislocation. There is extensive osteoarthritic change in the first carpal -metacarpal joint with remodeling of the trapezium bone. There is moderate scaphotrapezial osteoarthritis. Calcification noted between the proximal second and first metacarpals is likely of arthropathic etiology. IMPRESSION: Extensive bony remodeling in the first carpal-metacarpal joint region with advanced arthropathy in this area. There is moderate osteoarthritic change in the scaphotrapezial joint. There is no evident acute fracture or dislocation. Electronically Signed   By: Lowella Grip III M.D.   On: 01/09/2016 12:54   Ct Angio Chest Pe W And/or Wo Contrast  Result Date: 01/11/2016 CLINICAL DATA:  Continued pain in both shoulders and left arm, particularly over the last week. EXAM: CT ANGIOGRAPHY CHEST WITH CONTRAST TECHNIQUE: Multidetector CT imaging of the chest was performed using the standard protocol during bolus administration of intravenous contrast. Multiplanar CT image reconstructions and MIPs were obtained to evaluate the vascular anatomy. CONTRAST:  100 cc Isovue 370 COMPARISON:  10/10/2013 FINDINGS: Cardiovascular: Pulmonary arterial opacification is good. No pulmonary  emboli. There is aortic atherosclerosis. No aneurysm or dissection. No coronary artery calcification is seen. The heart is mildly enlarged. No pericardial fluid. Mediastinum/Nodes: No hilar or mediastinal mass or lymphadenopathy. Lungs/Pleura: Mild scarring or atelectasis at both lung bases. No sign of active infiltrate. No effusion. Upper Abdomen: Negative. Previous cholecystectomy. Benign right renal cyst. Musculoskeletal: Ordinary degenerative changes affect the spine. Review of the MIP images confirms the above findings. IMPRESSION: No pulmonary emboli. No acute aortic disease. Aortic atherosclerosis without aneurysm or dissection. Mild scarring or atelectasis at both lung bases. Electronically Signed   By: Nelson Chimes M.D.   On: 01/11/2016 13:11   Ct Humerus Left Wo Contrast  Result Date: 01/13/2016 CLINICAL DATA:  Left proximal humerus fracture/dislocation on 01/09/2016. EXAM: CT OF THE LEFT SHOULDER WITHOUT CONTRAST; CT OF THE LEFT HUMERUS WITHOUT CONTRAST TECHNIQUE: Multidetector CT imaging was performed according to the standard protocol. Multiplanar CT image reconstructions were also generated. COMPARISON:  Radiographs dated 01/12/2016  and 01/09/2016 FINDINGS: CT SCAN OF THE LEFT SHOULDER: There is a displaced 3.5 by 2.7 cm comminuted fracture of the greater tuberosity of the proximal left humerus. This involves the insertions of supraspinous, infraspinatus, and teres minor tendons. Fragments are displaced posteriorly as well as anterosuperiorly, best seen on the sagittal images. Dislocation has been reduced. Subscapularis insertion appears to be intact. There is a tiny hairline nondisplaced fracture of the anterior inferior aspect of the glenoid best seen on image 51 of series 10. AC joint is normal. Scapula is normal. CT SCAN OF THE LEFT HUMERUS: Again noted is the comminuted avulsion fracture of the greater tuberosity of the proximal left humerus. Displaced fragments are seen in posteriorly and  anteriorly superiorly as described above. The humeral shaft is intact. Distal humeral metaphysis is not included on the CT scan. IMPRESSION: 1. Comminuted avulsion fracture of the greater tuberosity of the proximal humerus involving the insertions of the supraspinous, infraspinatus and teres minor tendons. 2. Humeral shaft is intact. 3. Dislocation has been reduced. 4. Tiny nondisplaced hairline fracture of the anterior inferior aspect of the glenoid. Electronically Signed   By: Lorriane Shire M.D.   On: 01/13/2016 13:54   Ct Shoulder Left Wo Contrast  Result Date: 01/13/2016 CLINICAL DATA:  Left proximal humerus fracture/dislocation on 01/09/2016. EXAM: CT OF THE LEFT SHOULDER WITHOUT CONTRAST; CT OF THE LEFT HUMERUS WITHOUT CONTRAST TECHNIQUE: Multidetector CT imaging was performed according to the standard protocol. Multiplanar CT image reconstructions were also generated. COMPARISON:  Radiographs dated 01/12/2016 and 01/09/2016 FINDINGS: CT SCAN OF THE LEFT SHOULDER: There is a displaced 3.5 by 2.7 cm comminuted fracture of the greater tuberosity of the proximal left humerus. This involves the insertions of supraspinous, infraspinatus, and teres minor tendons. Fragments are displaced posteriorly as well as anterosuperiorly, best seen on the sagittal images. Dislocation has been reduced. Subscapularis insertion appears to be intact. There is a tiny hairline nondisplaced fracture of the anterior inferior aspect of the glenoid best seen on image 51 of series 10. AC joint is normal. Scapula is normal. CT SCAN OF THE LEFT HUMERUS: Again noted is the comminuted avulsion fracture of the greater tuberosity of the proximal left humerus. Displaced fragments are seen in posteriorly and anteriorly superiorly as described above. The humeral shaft is intact. Distal humeral metaphysis is not included on the CT scan. IMPRESSION: 1. Comminuted avulsion fracture of the greater tuberosity of the proximal humerus involving the  insertions of the supraspinous, infraspinatus and teres minor tendons. 2. Humeral shaft is intact. 3. Dislocation has been reduced. 4. Tiny nondisplaced hairline fracture of the anterior inferior aspect of the glenoid. Electronically Signed   By: Lorriane Shire M.D.   On: 01/13/2016 13:54   Ct Shoulder Right Wo Contrast  Result Date: 01/12/2016 CLINICAL DATA:  Evaluate proximal humerus fracture. EXAM: CT OF THE RIGHT SHOULDER WITHOUT CONTRAST TECHNIQUE: Multidetector CT imaging was performed according to the standard protocol. Multiplanar CT image reconstructions were also generated. COMPARISON:  Radiographs same date. FINDINGS: There is a comminuted displaced fracture involving the proximal shaft of the humerus. This does involve the humeral neck but this portion of the fracture is not displaced. No fracture of the greater tuberosity or lesser tuberosity. The humeral head is normally located in the glenoid fossa. The [facility] joint is intact. No clavicle or scapular fracture. The visualized right ribs are intact. The visualized right lung is grossly clear. IMPRESSION: Complex comminuted proximal humeral shaft fracture. There is involvement of the humeral neck  but no displacement. No involvement of the greater or lesser tuberosities. Electronically Signed   By: Marijo Sanes M.D.   On: 01/12/2016 18:26   Dg Chest Port 1 View  Result Date: 01/16/2016 CLINICAL DATA:  Poor venous access.  Humerus shaft fracture. EXAM: PORTABLE CHEST 1 VIEW COMPARISON:  Chest CT 01/11/2016 FINDINGS: Tip of the left internal jugular central venous catheter projects over the region of the proximal SVC. There is no pneumothorax. Heart size and mediastinal contours are unchanged with mild cardiomegaly. Lung volumes are low. Scattered atelectasis. Possible vascular congestion versus bronchovascular crowding. No pleural effusion. Postsurgical change in the right proximal humerus. IMPRESSION: Tip of the left central line projects over the  proximal SVC. No pneumothorax. Cardiomegaly. Possible vascular congestion versus bronchovascular crowding secondary to low lung volumes. Electronically Signed   By: Jeb Levering M.D.   On: 01/16/2016 02:32   Dg Shoulder Left  Addendum Date: 01/12/2016   ADDENDUM REPORT: 01/12/2016 16:10 ADDENDUM: The bony fragments seen superior and lateral to the humeral head represent a known acute fracture, rather than an old injury as previously described. There is no residual dislocation. Electronically Signed   By: Fidela Salisbury M.D.   On: 01/12/2016 16:10   Result Date: 01/12/2016 CLINICAL DATA:  Bilateral shoulder pain. EXAM: LEFT SHOULDER - 2+ VIEW COMPARISON:  01/09/2016 FINDINGS: There is no evidence of acute fracture or dislocation. Well corticated calcific fragments from previous fracture dislocation of the left humeral head are seen along the lateral and superior margin of the left femoral head. IMPRESSION: No evidence of acute fracture or dislocation of the left shoulder. Well corticated calcific fragment from previous healed fracture dislocation of the left humeral head are present. Electronically Signed: By: Fidela Salisbury M.D. On: 01/12/2016 15:58   Dg Shoulder Left  Result Date: 01/09/2016 CLINICAL DATA:  Pain following fall EXAM: LEFT SHOULDER - 2+ VIEW COMPARISON:  None. FINDINGS: Frontal and Y scapular images were obtained. There is a subcoracoid anterior shoulder dislocation. There is a comminuted fracture along the lateral humeral head with several displaced fracture fragments in this area. No other fractures are evident. No erosive change evident. Visualized left lung is grossly clear. IMPRESSION: Comminuted fracture along the lateral aspect of the left humeral head with subcoracoid anterior dislocation. There appears to be impaction at the site of the inferior glenoid. No other fractures are evident. Electronically Signed   By: Lowella Grip III M.D.   On: 01/09/2016 12:52    Dg Shoulder Left Port  Result Date: 01/21/2016 CLINICAL DATA:  Postoperative radiograph, for known left humeral head fracture. Initial encounter. EXAM: LEFT SHOULDER - 1 VIEW COMPARISON:  None. FINDINGS: The left humeral head fracture demonstrates improved alignment, with a displaced osseous fragment again noted overlying the distal insertion of the rotator cuff. There is no evidence of dislocation. The hairline fracture of the glenoid is not well seen on radiograph. The visualized portions of the lungs are grossly clear. A left IJ line is noted ending about the mid SVC. IMPRESSION: Improved alignment of left humeral head fracture. Displaced osseous fragment again noted overlying the distal insertion of the rotator cuff. Known hairline fracture of the glenoid is not well seen on radiograph. Electronically Signed   By: Garald Balding M.D.   On: 01/21/2016 01:42   Dg Shoulder Left Portable  Result Date: 01/09/2016 CLINICAL DATA:  Status post reduction of anterior shoulder dislocation today. EXAM: LEFT SHOULDER - 1 VIEW COMPARISON:  Plain films of the left  shoulder earlier today. FINDINGS: The shoulder has been reduced. Large Hill-Sachs lesion is again seen. No new abnormality. IMPRESSION: Successful reduction of dislocation. Large Hill-Sachs deformity noted. Electronically Signed   By: Inge Rise M.D.   On: 01/09/2016 16:46   Dg Shoulder Right Port  Result Date: 01/16/2016 CLINICAL DATA:  Proximal humerus fracture, postop. EXAM: PORTABLE RIGHT SHOULDER COMPARISON:  Preoperative exam 01/12/2016 FINDINGS: Lateral plate and multi screw fixation of comminuted proximal humerus fracture. Fracture is in improved alignment. Acromioclavicular and glenohumeral alignment maintained. IMPRESSION: Plate and screw fixation of right proximal humerus fracture, improved in alignment. Electronically Signed   By: Jeb Levering M.D.   On: 01/16/2016 02:30   Dg Humerus Right  Result Date: 01/16/2016 CLINICAL  DATA:  ORIF right humerus fracture. EXAM: DG C-ARM GT 120 MIN; RIGHT HUMERUS - 2+ VIEW FLUOROSCOPY TIME:  Fluoroscopy Time:  59 seconds Radiation Exposure Index (if provided by the fluoroscopic device): Not provided Number of Acquired Spot Images: 4 COMPARISON:  Radiographs 01/12/2016 FINDINGS: Lateral plate and multi screw fixation of right proximal humerus fracture. Fracture is in improved alignment compared to preoperative exam. Fluoroscopy time reported 59 seconds. IMPRESSION: Plate and screw fixation of right proximal humerus fracture, in improved alignment. Electronically Signed   By: Jeb Levering M.D.   On: 01/16/2016 00:55   Dg C-arm Gt 120 Min  Result Date: 01/16/2016 CLINICAL DATA:  ORIF right humerus fracture. EXAM: DG C-ARM GT 120 MIN; RIGHT HUMERUS - 2+ VIEW FLUOROSCOPY TIME:  Fluoroscopy Time:  59 seconds Radiation Exposure Index (if provided by the fluoroscopic device): Not provided Number of Acquired Spot Images: 4 COMPARISON:  Radiographs 01/12/2016 FINDINGS: Lateral plate and multi screw fixation of right proximal humerus fracture. Fracture is in improved alignment compared to preoperative exam. Fluoroscopy time reported 59 seconds. IMPRESSION: Plate and screw fixation of right proximal humerus fracture, in improved alignment. Electronically Signed   By: Jeb Levering M.D.   On: 01/16/2016 00:55       Filed Weights   01/12/16 1419 01/13/16 0049  Weight: 119.7 kg (264 lb) 119.7 kg (263 lb 14.3 oz)     Microbiology: Recent Results (from the past 240 hour(s))  MRSA PCR Screening     Status: None   Collection Time: 01/12/16 11:11 PM  Result Value Ref Range Status   MRSA by PCR NEGATIVE NEGATIVE Final    Comment:        The GeneXpert MRSA Assay (FDA approved for NASAL specimens only), is one component of a comprehensive MRSA colonization surveillance program. It is not intended to diagnose MRSA infection nor to guide or monitor treatment for MRSA infections.         Blood Culture    Component Value Date/Time   SDES WOUND BACK 09/23/2012 1846   SDES WOUND BACK 09/23/2012 1846   SDES WOUND BACK 09/23/2012 1846   SPECREQUEST PATIENT ON FOLLOWING KEFLEX 09/23/2012 1846   SPECREQUEST PATIENT ON FOLLOWING KEFLEX 09/23/2012 1846   SPECREQUEST PATIENT ON FOLLOWING KEFLEX 09/23/2012 1846   CULT NO ANAEROBES ISOLATED 09/23/2012 1846   CULT NO GROWTH 2 DAYS 09/23/2012 1846   REPTSTATUS 09/28/2012 FINAL 09/23/2012 1846   REPTSTATUS 09/25/2012 FINAL 09/23/2012 1846   REPTSTATUS 09/23/2012 FINAL 09/23/2012 1846      Labs: Results for orders placed or performed during the hospital encounter of 01/12/16 (from the past 48 hour(s))  Glucose, capillary     Status: Abnormal   Collection Time: 01/19/16 11:57 AM  Result Value Ref Range  Glucose-Capillary 121 (H) 65 - 99 mg/dL  Glucose, capillary     Status: Abnormal   Collection Time: 01/19/16  4:20 PM  Result Value Ref Range   Glucose-Capillary 155 (H) 65 - 99 mg/dL  Glucose, capillary     Status: None   Collection Time: 01/19/16  9:32 PM  Result Value Ref Range   Glucose-Capillary 93 65 - 99 mg/dL  CBC     Status: Abnormal   Collection Time: 01/20/16  5:03 AM  Result Value Ref Range   WBC 5.8 4.0 - 10.5 K/uL   RBC 3.20 (L) 3.87 - 5.11 MIL/uL   Hemoglobin 9.0 (L) 12.0 - 15.0 g/dL   HCT 29.5 (L) 36.0 - 46.0 %   MCV 92.2 78.0 - 100.0 fL   MCH 28.1 26.0 - 34.0 pg   MCHC 30.5 30.0 - 36.0 g/dL   RDW 16.1 (H) 11.5 - 15.5 %   Platelets 238 150 - 400 K/uL  Comprehensive metabolic panel     Status: Abnormal   Collection Time: 01/20/16  5:03 AM  Result Value Ref Range   Sodium 138 135 - 145 mmol/L   Potassium 3.7 3.5 - 5.1 mmol/L   Chloride 102 101 - 111 mmol/L   CO2 29 22 - 32 mmol/L   Glucose, Bld 101 (H) 65 - 99 mg/dL   BUN 11 6 - 20 mg/dL   Creatinine, Ser 0.87 0.44 - 1.00 mg/dL   Calcium 9.1 8.9 - 10.3 mg/dL   Total Protein 5.1 (L) 6.5 - 8.1 g/dL   Albumin 2.3 (L) 3.5 - 5.0 g/dL   AST 16  15 - 41 U/L   ALT 8 (L) 14 - 54 U/L   Alkaline Phosphatase 49 38 - 126 U/L   Total Bilirubin 0.8 0.3 - 1.2 mg/dL   GFR calc non Af Amer >60 >60 mL/min   GFR calc Af Amer >60 >60 mL/min    Comment: (NOTE) The eGFR has been calculated using the CKD EPI equation. This calculation has not been validated in all clinical situations. eGFR's persistently <60 mL/min signify possible Chronic Kidney Disease.    Anion gap 7 5 - 15  Glucose, capillary     Status: None   Collection Time: 01/20/16  5:25 AM  Result Value Ref Range   Glucose-Capillary 86 65 - 99 mg/dL  Glucose, capillary     Status: None   Collection Time: 01/20/16 11:07 AM  Result Value Ref Range   Glucose-Capillary 98 65 - 99 mg/dL  Glucose, capillary     Status: None   Collection Time: 01/20/16  4:13 PM  Result Value Ref Range   Glucose-Capillary 83 65 - 99 mg/dL  Glucose, capillary     Status: Abnormal   Collection Time: 01/21/16 12:28 AM  Result Value Ref Range   Glucose-Capillary 119 (H) 65 - 99 mg/dL  CBC     Status: Abnormal   Collection Time: 01/21/16  5:00 AM  Result Value Ref Range   WBC 7.6 4.0 - 10.5 K/uL   RBC 3.16 (L) 3.87 - 5.11 MIL/uL   Hemoglobin 8.9 (L) 12.0 - 15.0 g/dL   HCT 28.7 (L) 36.0 - 46.0 %   MCV 90.8 78.0 - 100.0 fL   MCH 28.2 26.0 - 34.0 pg   MCHC 31.0 30.0 - 36.0 g/dL   RDW 16.0 (H) 11.5 - 15.5 %   Platelets 259 150 - 400 K/uL  Creatinine, serum     Status: None   Collection  Time: 01/21/16  5:00 AM  Result Value Ref Range   Creatinine, Ser 0.84 0.44 - 1.00 mg/dL   GFR calc non Af Amer >60 >60 mL/min   GFR calc Af Amer >60 >60 mL/min    Comment: (NOTE) The eGFR has been calculated using the CKD EPI equation. This calculation has not been validated in all clinical situations. eGFR's persistently <60 mL/min signify possible Chronic Kidney Disease.   Glucose, capillary     Status: Abnormal   Collection Time: 01/21/16  6:37 AM  Result Value Ref Range   Glucose-Capillary 100 (H) 65 - 99  mg/dL     Lipid Panel     Component Value Date/Time   CHOL 178 05/13/2015 1155   TRIG 85 05/13/2015 1155   HDL 71 05/13/2015 1155   CHOLHDL 2.5 05/13/2015 1155   VLDL 17 05/13/2015 1155   LDLCALC 90 05/13/2015 1155   LDLDIRECT 131 (H) 09/06/2013 1253           HPI :*  Yesenia [name] a 65 y.o.femalewith medical history significant of DM, hypothyrdoism, remote h/o DVT/PE, HTN, fibromylagia, and GERD presenting for her 4th ED visit in as many days following a fall, was diagnosed with bilateral humerus fracture. Orthopedic surgery consulted, s/p operative repair on 10/11  HOSPITAL COURSE:    Bilateral humerus fractures She has bilateral shoulder fracture   Patient seen by orthopedics Dr. Marlou Sa, status post OPEN REDUCTION INTERNAL FIXATION (ORIF) PROXIMAL HUMERUS FRACTURE 10/11, 10/16  Comminuted fracture of the greater tuberosity and possibly part of the lesser tuberosity indicating significant rotator cuff pathology is noted on left shoulder CT scan Discussed with  Meredith Pel, MD, okay to discharge to SNF, he will make therapy recommendations   HTN  controlled, reduced dose of HCTZ given anticipated surgery Resume home dose at discharge   DM - A1c in 2/17 was 6.4 (and this is true since 2015), controlled Treated with sliding scale insulin, resume home regimen at discharge   Hypothyroidism - TSH was elevated in 2/17 with normal T3 and free T4 - repeat TSH elevated at 4.8, normal free T4. No change in regimen, recheck TSH in 3-4 weeks as an outpatient  CKD II - Cr stable, 0.84 on the day of discharge  GERD - Continue Dexilant and Zantac (formulary substitution)  HLD - Continue Zocor  Anemia Hemoglobin has been stable between 9.0-11. Hemoglobin 8.9 on the day of discharge Monitor closely   Fibromylagia - pain regimen as above for #1 klonopin for anxiety  Constipation 1 bowel movement, continue MiraLAX, continue  Linzess Received relistor 10/14, patient had a BM yesterday with excellent results     Discharge Exam:   Blood pressure 116/70, pulse 79, temperature 98.9 F (37.2 C), temperature source Oral, resp. rate 15, height 5' 1" (1.549 m), weight 119.7 kg (263 lb 14.3 oz), SpO2 95 %.  Eyes: lids and conjunctivae normal ENMT: Mucous membranes are moist.  Respiratory: clear to auscultation bilaterally, no wheezing, no crackles.  Cardiovascular: Regular rate and rhythm, no murmurs / rubs / gallops. Abdomen: no tenderness. Bowel sounds positive.  Musculoskeletal: no clubbing / cyanosis.  Skin: no rashes, lesions, ulcers. No induration Neurologic: non focal     Follow-up Information    Vic Blackbird, MD. Schedule an appointment as soon as possible for a visit in 1 week(s).   Specialty:  Family Medicine Why:  hospital follow-up Contact information: [address] [place] [address] [phone]  Meredith Pel, MD. Schedule an appointment as soon as possible for a visit in 2 day(s).   Specialty:  Orthopedic Surgery Why:  Call to make this appointment for [facility] follow-up Contact information: [place] [address] [phone]           Signed: Reyne Dumas 01/21/2016, 11:08 AM        Time spent >45 mins

## 2016-01-21 NOTE — Transfer of Care (Signed)
Immediate Anesthesia Transfer of Care Note  Patient: Yesenia [name]  Procedure(s) Performed: Procedure(s): OPEN REDUCTION INTERNAL FIXATION (ORIF) PROXIMAL HUMERUS FRACTURE (Left)  Patient Location: PACU  Anesthesia Type:General  Level of Consciousness: oriented, sedated, patient cooperative and responds to stimulation  Airway & Oxygen Therapy: Patient Spontanous Breathing and Patient connected to nasal cannula oxygen  Post-op Assessment: Report given to RN, Post -op Vital signs reviewed and stable and Patient moving all extremities X 4  Post vital signs: Reviewed and stable  Last Vitals:  Vitals:   01/20/16 0509 01/20/16 1320  BP: (!) 130/57 (!) 135/58  Pulse: 69 73  Resp: 16   Temp: 36.8 C 36.9 C    Last Pain:  Vitals:   01/20/16 1916  TempSrc:   PainSc: 7       Patients Stated Pain Goal: 2 ([id number])  Complications: No apparent anesthesia complications

## 2016-01-22 ENCOUNTER — Encounter (HOSPITAL_COMMUNITY): Payer: Self-pay | Admitting: Orthopedic Surgery

## 2016-01-23 ENCOUNTER — Non-Acute Institutional Stay (SKILLED_NURSING_FACILITY): Payer: Medicare Other | Admitting: Internal Medicine

## 2016-01-23 ENCOUNTER — Encounter: Payer: Self-pay | Admitting: Internal Medicine

## 2016-01-23 DIAGNOSIS — K5900 Constipation, unspecified: Secondary | ICD-10-CM | POA: Diagnosis not present

## 2016-01-23 DIAGNOSIS — M797 Fibromyalgia: Secondary | ICD-10-CM

## 2016-01-23 DIAGNOSIS — I10 Essential (primary) hypertension: Secondary | ICD-10-CM

## 2016-01-23 DIAGNOSIS — D509 Iron deficiency anemia, unspecified: Secondary | ICD-10-CM

## 2016-01-23 DIAGNOSIS — E038 Other specified hypothyroidism: Secondary | ICD-10-CM

## 2016-01-23 DIAGNOSIS — S42209S Unspecified fracture of upper end of unspecified humerus, sequela: Secondary | ICD-10-CM | POA: Diagnosis not present
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
## 2016-01-24 ENCOUNTER — Encounter (HOSPITAL_COMMUNITY)
Admission: RE | Admit: 2016-01-24 | Discharge: 2016-01-24 | Disposition: A | Discharge status: Home / Self Care | Payer: Medicare Other | Source: Skilled Nursing Facility | Attending: Internal Medicine | Admitting: Internal Medicine

## 2016-01-24 ENCOUNTER — Other Ambulatory Visit: Payer: Self-pay | Admitting: Family Medicine

## 2016-01-24 LAB — COMPREHENSIVE METABOLIC PANEL
ALBUMIN: 2.6 g/dL — AB (ref 3.5–5.0)
ALT: 7 U/L — AB (ref 14–54)
AST: 16 U/L (ref 15–41)
Alkaline Phosphatase: 65 U/L (ref 38–126)
Anion gap: 7 (ref 5–15)
BILIRUBIN TOTAL: 0.8 mg/dL (ref 0.3–1.2)
BUN: 11 mg/dL (ref 6–20)
CHLORIDE: 101 mmol/L (ref 101–111)
CO2: 28 mmol/L (ref 22–32)
CREATININE: 0.82 mg/dL (ref 0.44–1.00)
Calcium: 8.9 mg/dL (ref 8.9–10.3)
GFR calc Af Amer: >60 mL/min (ref >60)
GFR calc non Af Amer: >60 mL/min (ref >60)
GLUCOSE: 104 mg/dL — AB (ref 65–99)
POTASSIUM: 3.4 mmol/L — AB (ref 3.5–5.1)
Sodium: 136 mmol/L (ref 135–145)
Total Protein: 5.9 g/dL — ABNORMAL LOW (ref 6.5–8.1)

## 2016-01-24 LAB — TSH: TSH: 6.823 u[IU]/mL — ABNORMAL HIGH (ref 0.350–4.500)

## 2016-01-24 LAB — CBC
HEMATOCRIT: 29.2 % — AB (ref 36.0–46.0)
Hemoglobin: 8.9 g/dL — ABNORMAL LOW (ref 12.0–15.0)
MCH: 28.8 pg (ref 26.0–34.0)
MCHC: 30.5 g/dL (ref 30.0–36.0)
MCV: 94.5 fL (ref 78.0–100.0)
PLATELETS: 277 10*3/uL (ref 150–400)
RBC: 3.09 MIL/uL — AB (ref 3.87–5.11)
RDW: 16.6 % — AB (ref 11.5–15.5)
WBC: 6.4 10*3/uL (ref 4.0–10.5)

## 2016-01-27 ENCOUNTER — Encounter (HOSPITAL_COMMUNITY)
Admission: RE | Admit: 2016-01-27 | Discharge: 2016-01-27 | Disposition: A | Discharge status: Home / Self Care | Payer: Medicare Other | Source: Skilled Nursing Facility | Attending: *Deleted | Admitting: *Deleted

## 2016-01-27 LAB — CBC
HEMATOCRIT: 32.6 % — AB (ref 36.0–46.0)
Hemoglobin: 10.1 g/dL — ABNORMAL LOW (ref 12.0–15.0)
MCH: 28.8 pg (ref 26.0–34.0)
MCHC: 31 g/dL (ref 30.0–36.0)
MCV: 92.9 fL (ref 78.0–100.0)
Platelets: 372 10*3/uL (ref 150–400)
RBC: 3.51 MIL/uL — ABNORMAL LOW (ref 3.87–5.11)
RDW: 16.1 % — AB (ref 11.5–15.5)
WBC: 5.9 10*3/uL (ref 4.0–10.5)

## 2016-01-27 LAB — BASIC METABOLIC PANEL
Anion gap: 9 (ref 5–15)
BUN: 14 mg/dL (ref 6–20)
CHLORIDE: 99 mmol/L — AB (ref 101–111)
CO2: 25 mmol/L (ref 22–32)
CREATININE: 0.84 mg/dL (ref 0.44–1.00)
Calcium: 9.3 mg/dL (ref 8.9–10.3)
GFR calc non Af Amer: >60 mL/min (ref >60)
Glucose, Bld: 99 mg/dL (ref 65–99)
POTASSIUM: 3.7 mmol/L (ref 3.5–5.1)
Sodium: 133 mmol/L — ABNORMAL LOW (ref 135–145)

## 2016-01-27 LAB — TSH: TSH: 4.993 u[IU]/mL — ABNORMAL HIGH (ref 0.350–4.500)

## 2016-01-28 ENCOUNTER — Non-Acute Institutional Stay (SKILLED_NURSING_FACILITY): Payer: Medicare Other | Admitting: Internal Medicine

## 2016-01-28 ENCOUNTER — Encounter: Payer: Self-pay | Admitting: Internal Medicine

## 2016-01-28 DIAGNOSIS — K5900 Constipation, unspecified: Secondary | ICD-10-CM | POA: Diagnosis not present

## 2016-01-28 DIAGNOSIS — E038 Other specified hypothyroidism: Secondary | ICD-10-CM

## 2016-01-28 DIAGNOSIS — S42209S Unspecified fracture of upper end of unspecified humerus, sequela: Secondary | ICD-10-CM

## 2016-01-28 DIAGNOSIS — D509 Iron deficiency anemia, unspecified: Secondary | ICD-10-CM | POA: Diagnosis not present

## 2016-01-28 NOTE — Progress Notes (Signed)
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 seconds Radiation Exposure Index (if provided by the fluoroscopic device): Not provided Number of Acquired Spot Images: 4 COMPARISON:  Radiographs 01/12/2016 FINDINGS: Lateral plate and multi screw fixation of right proximal humerus fracture. Fracture is in improved alignment compared to preoperative exam. Fluoroscopy time reported 59 seconds. IMPRESSION: Plate and screw fixation of right proximal humerus fracture, in improved alignment. Electronically Signed   By: Jeb Levering M.D.   On: 01/16/2016 00:55   Dg C-arm Gt 120 Min  Result Date: 01/16/2016 CLINICAL DATA:  ORIF right humerus fracture. EXAM: DG C-ARM GT 120 MIN; RIGHT HUMERUS - 2+ VIEW FLUOROSCOPY TIME:  Fluoroscopy Time:  59 seconds Radiation Exposure Index (if provided by the fluoroscopic device): Not provided Number of Acquired Spot Images: 4 COMPARISON:  Radiographs 01/12/2016 FINDINGS: Lateral plate and multi screw fixation  of right proximal humerus  fracture. Fracture is in improved alignment compared to preoperative exam. Fluoroscopy time reported 59 seconds. IMPRESSION: Plate and screw fixation of right proximal humerus fracture, in improved alignment. Electronically Signed   By: Jeb Levering M.D.   On: 01/16/2016 00:55    Assessment/Plan B/L Humerus Fracture.S/P ORIF  Doing well has appointment with Dr Marlou Sa for follow up. Will continue Dilaudid for pain control. Make Robaxin more standing order as her pain gets worse if the staff doesn't give her PRN meds.  Hypothyroidism Repeat TSH was elevated. Will increase her dose to 125 Mcg Repeat TSH in 6 weeks.   Constipation Will D/d Miralax and change the dose of Colace. Continue Linzess.  Hypertension BP well controlled.  Anemia Hgb Stable. Repeat CBC in few days.   Family/ staff Communication:   Labs/tests ordered:

## 2016-01-29 ENCOUNTER — Inpatient Hospital Stay (INDEPENDENT_AMBULATORY_CARE_PROVIDER_SITE_OTHER): Payer: Self-pay

## 2016-01-29 ENCOUNTER — Encounter (INDEPENDENT_AMBULATORY_CARE_PROVIDER_SITE_OTHER): Payer: Self-pay | Admitting: Orthopedic Surgery

## 2016-01-29 ENCOUNTER — Other Ambulatory Visit: Payer: Self-pay | Admitting: *Deleted

## 2016-01-29 ENCOUNTER — Inpatient Hospital Stay (INDEPENDENT_AMBULATORY_CARE_PROVIDER_SITE_OTHER): Payer: Medicare Other

## 2016-01-29 ENCOUNTER — Ambulatory Visit (INDEPENDENT_AMBULATORY_CARE_PROVIDER_SITE_OTHER): Payer: Medicare Other | Admitting: Orthopedic Surgery

## 2016-01-29 DIAGNOSIS — S42291D Other displaced fracture of upper end of right humerus, subsequent encounter for fracture with routine healing: Secondary | ICD-10-CM

## 2016-01-29 DIAGNOSIS — S42292D Other displaced fracture of upper end of left humerus, subsequent encounter for fracture with routine healing: Secondary | ICD-10-CM

## 2016-01-29 MED ORDER — HYDROMORPHONE HCL 2 MG PO TABS: ORAL_TABLET | ORAL | 0 refills | Status: DC

## 2016-01-29 NOTE — Telephone Encounter (Signed)
[facility] #[phone] Fax: [phone]   
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
## 2016-01-30 ENCOUNTER — Encounter (HOSPITAL_COMMUNITY)
Admission: RE | Admit: 2016-01-30 | Discharge: 2016-01-30 | Disposition: A | Discharge status: Home / Self Care | Payer: Medicare Other | Source: Skilled Nursing Facility | Attending: *Deleted | Admitting: *Deleted

## 2016-01-30 LAB — BASIC METABOLIC PANEL
ANION GAP: 9 (ref 5–15)
BUN: 12 mg/dL (ref 6–20)
CALCIUM: 9.7 mg/dL (ref 8.9–10.3)
CO2: 25 mmol/L (ref 22–32)
Chloride: 98 mmol/L — ABNORMAL LOW (ref 101–111)
Creatinine, Ser: 0.93 mg/dL (ref 0.44–1.00)
Glucose, Bld: 106 mg/dL — ABNORMAL HIGH (ref 65–99)
POTASSIUM: 3.7 mmol/L (ref 3.5–5.1)
Sodium: 132 mmol/L — ABNORMAL LOW (ref 135–145)

## 2016-01-30 LAB — CBC
HCT: 36.7 % (ref 36.0–46.0)
Hemoglobin: 11.3 g/dL — ABNORMAL LOW (ref 12.0–15.0)
MCH: 28.6 pg (ref 26.0–34.0)
MCHC: 30.8 g/dL (ref 30.0–36.0)
MCV: 92.9 fL (ref 78.0–100.0)
PLATELETS: 383 10*3/uL (ref 150–400)
RBC: 3.95 MIL/uL (ref 3.87–5.11)
RDW: 15.9 % — AB (ref 11.5–15.5)
WBC: 4.7 10*3/uL (ref 4.0–10.5)

## 2016-02-03 ENCOUNTER — Encounter: Payer: Self-pay | Admitting: Internal Medicine

## 2016-02-03 ENCOUNTER — Non-Acute Institutional Stay (SKILLED_NURSING_FACILITY): Payer: Medicare Other | Admitting: Internal Medicine

## 2016-02-03 DIAGNOSIS — D509 Iron deficiency anemia, unspecified: Secondary | ICD-10-CM | POA: Diagnosis not present

## 2016-02-03 DIAGNOSIS — E038 Other specified hypothyroidism: Secondary | ICD-10-CM | POA: Diagnosis not present

## 2016-02-03 DIAGNOSIS — I10 Essential (primary) hypertension: Secondary | ICD-10-CM | POA: Diagnosis not present

## 2016-02-03 DIAGNOSIS — E1149 Type 2 diabetes mellitus with other diabetic neurological complication: Secondary | ICD-10-CM

## 2016-02-03 DIAGNOSIS — S42209S Unspecified fracture of upper end of unspecified humerus, sequela: Secondary | ICD-10-CM

## 2016-02-03 NOTE — Progress Notes (Signed)
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 other fractures are evident. Electronically Signed   By: Lowella Grip III M.D.   On: 01/09/2016 12:52   Dg Shoulder Left Port  Result Date: 01/21/2016 CLINICAL DATA:  Postoperative radiograph, for known left humeral head fracture. Initial encounter. EXAM: LEFT SHOULDER - 1 VIEW COMPARISON:  None. FINDINGS: The left humeral head fracture demonstrates improved alignment, with a displaced osseous fragment again noted overlying the distal insertion of the rotator cuff. There is no evidence of dislocation. The hairline fracture of the glenoid  is not well seen on radiograph. The visualized portions of the lungs are grossly clear. A left IJ line is noted ending about the mid SVC. IMPRESSION: Improved alignment of left humeral head fracture. Displaced osseous fragment again noted overlying the distal insertion of the rotator cuff. Known hairline fracture of the glenoid is not  well seen on radiograph. Electronically Signed   By: Garald Balding M.D.   On: 01/21/2016 01:42   Dg Shoulder Left Portable  Result Date: 01/09/2016 CLINICAL DATA:  Status post reduction of anterior shoulder dislocation today. EXAM: LEFT SHOULDER - 1 VIEW COMPARISON:  Plain films of the left shoulder earlier today. FINDINGS: The shoulder has been reduced. Large Hill-Sachs lesion is again seen. No new abnormality. IMPRESSION: Successful reduction of dislocation. Large Hill-Sachs deformity noted. Electronically Signed   By: Inge Rise M.D.   On: 01/09/2016 16:46   Dg Shoulder Right Port  Result Date: 01/16/2016 CLINICAL DATA:  Proximal humerus fracture, postop. EXAM: PORTABLE RIGHT SHOULDER COMPARISON:  Preoperative exam 01/12/2016 FINDINGS: Lateral plate and multi screw fixation of comminuted proximal humerus fracture. Fracture is in improved alignment. Acromioclavicular and glenohumeral alignment maintained. IMPRESSION: Plate and screw fixation of right proximal humerus fracture, improved in alignment. Electronically Signed   By: Jeb Levering M.D.   On: 01/16/2016 02:30   Dg Humerus Right  Result Date: 01/16/2016 CLINICAL DATA:  ORIF right humerus fracture. EXAM: DG C-ARM GT 120 MIN; RIGHT HUMERUS - 2+ VIEW FLUOROSCOPY TIME:  Fluoroscopy Time:  59 seconds Radiation Exposure Index (if provided by the fluoroscopic device): Not provided Number of Acquired Spot Images: 4 COMPARISON:  Radiographs 01/12/2016 FINDINGS: Lateral plate and multi screw fixation of right proximal humerus fracture. Fracture is in improved alignment compared to preoperative exam. Fluoroscopy time reported 59 seconds. IMPRESSION: Plate and screw fixation of right proximal humerus fracture, in improved alignment. Electronically Signed   By: Jeb Levering M.D.   On: 01/16/2016 00:55   Dg C-arm Gt 120 Min  Result Date: 01/16/2016 CLINICAL DATA:  ORIF right humerus fracture. EXAM: DG C-ARM GT 120 MIN; RIGHT HUMERUS - 2+  VIEW FLUOROSCOPY TIME:  Fluoroscopy Time:  59 seconds Radiation Exposure Index (if provided by the fluoroscopic device): Not provided Number of Acquired Spot Images: 4 COMPARISON:  Radiographs 01/12/2016 FINDINGS: Lateral plate and multi screw fixation of right proximal humerus fracture. Fracture is in improved alignment compared to preoperative exam. Fluoroscopy time reported 59 seconds. IMPRESSION: Plate and screw fixation of right proximal humerus fracture, in improved alignment. Electronically Signed   By: Jeb Levering M.D.   On: 01/16/2016 00:55   Xr Shoulder Left  Result Date: 01/29/2016 T views of left shoulder ordered and obtained shows good consolidation of the bony fragments which were the tuberosity.  Shoulder is located.  Xr Shoulder Right  Result Date: 01/29/2016 2 views right shoulder were ordered and obtained show good alignment of the fracture and placement of the hardware.  No evidence of nonunion or malunion the shoulder is located   Assessment/Plan:    #1 history of bilateral humerus fractures status post ORIF-she appears to be doing relatively well here again will need continued PT and OT and home health support as well as CNA to help with activities of daily living-she is receiving pain control with Dilaudid 2 mg every 4 hours when necessary she is also receiving Robaxin appears to be tolerating this well.  She will have orthopedic follow-up.  #2 history of anemia postop this appears to be trending  up on iron last hemoglobin shows improvement of 11.3 will need follow-up by primary care provider.  #3 history of diabetes type 2 she is on Glucophage blood sugars appear to be stable largely in the lower 100s.  #4 history hypothyroidism TSH was mildly elevated and Synthroid was increased during her stay here will need updating by primary care provider TSH was 4.993 on lab done October 23-she is currently on 125 g of Synthroid.  #5-history constipation apparently actually  diarrhea was an issue earlier in her stay here but this has resolved she continues on Colace Linzess--Colace was reduced to once a day  #6-history of hypertension she is on hydrochlorothiazide with potassium supplementation-recent blood pressure 121/66-130/77.  #7-history of DVT she continues on Lovenox.  .  Clinically she appears to be doing well she will need expedient follow-up by primary care provider as well as services as noted above including PT OT CNA-nursing support   Patient is being discharged with the following home health services:  PT OT nursing support and CNA to help with activities of daily living     Patient has been advised to f/u with their PCP in 1-2 weeks to bring them up to date on their rehab stay.  Social services at facility was responsible for arranging this appointment.  Pt was provided with a 30 day supply of prescriptions for medications and refills must be obtained from their PCP.  For controlled substances, a more limited supply may be provided adequate until PCP appointment only.  [id number] note greater than 30 minutes spent on this discharge summary-greater than 50% of time spent coordinating plan of care for numerous diagnoses

## 2016-02-06 ENCOUNTER — Encounter (HOSPITAL_COMMUNITY)
Admission: RE | Admit: 2016-02-06 | Discharge: 2016-02-06 | Disposition: A | Discharge status: Home / Self Care | Payer: Medicare Other | Source: Skilled Nursing Facility | Attending: Internal Medicine | Admitting: Internal Medicine

## 2016-02-06 ENCOUNTER — Other Ambulatory Visit: Payer: Self-pay | Admitting: Family Medicine

## 2016-02-08 DIAGNOSIS — Z9181 History of falling: Secondary | ICD-10-CM | POA: Diagnosis not present

## 2016-02-08 DIAGNOSIS — M48061 Spinal stenosis, lumbar region without neurogenic claudication: Secondary | ICD-10-CM | POA: Diagnosis not present

## 2016-02-08 DIAGNOSIS — Z86711 Personal history of pulmonary embolism: Secondary | ICD-10-CM | POA: Diagnosis not present

## 2016-02-08 DIAGNOSIS — Z79891 Long term (current) use of opiate analgesic: Secondary | ICD-10-CM | POA: Diagnosis not present

## 2016-02-08 DIAGNOSIS — D509 Iron deficiency anemia, unspecified: Secondary | ICD-10-CM | POA: Diagnosis not present

## 2016-02-08 DIAGNOSIS — S42351D Displaced comminuted fracture of shaft of humerus, right arm, subsequent encounter for fracture with routine healing: Secondary | ICD-10-CM | POA: Diagnosis not present

## 2016-02-08 DIAGNOSIS — Z86718 Personal history of other venous thrombosis and embolism: Secondary | ICD-10-CM | POA: Diagnosis not present

## 2016-02-08 DIAGNOSIS — I10 Essential (primary) hypertension: Secondary | ICD-10-CM | POA: Diagnosis not present

## 2016-02-08 DIAGNOSIS — W19XXXD Unspecified fall, subsequent encounter: Secondary | ICD-10-CM | POA: Diagnosis not present

## 2016-02-08 DIAGNOSIS — S42252D Displaced fracture of greater tuberosity of left humerus, subsequent encounter for fracture with routine healing: Secondary | ICD-10-CM | POA: Diagnosis not present

## 2016-02-08 DIAGNOSIS — Z7901 Long term (current) use of anticoagulants: Secondary | ICD-10-CM | POA: Diagnosis not present

## 2016-02-08 DIAGNOSIS — E1140 Type 2 diabetes mellitus with diabetic neuropathy, unspecified: Secondary | ICD-10-CM | POA: Diagnosis not present

## 2016-02-10 DIAGNOSIS — Z86711 Personal history of pulmonary embolism: Secondary | ICD-10-CM | POA: Diagnosis not present

## 2016-02-10 DIAGNOSIS — S42351D Displaced comminuted fracture of shaft of humerus, right arm, subsequent encounter for fracture with routine healing: Secondary | ICD-10-CM | POA: Diagnosis not present

## 2016-02-10 DIAGNOSIS — Z7901 Long term (current) use of anticoagulants: Secondary | ICD-10-CM | POA: Diagnosis not present

## 2016-02-10 DIAGNOSIS — S42252D Displaced fracture of greater tuberosity of left humerus, subsequent encounter for fracture with routine healing: Secondary | ICD-10-CM | POA: Diagnosis not present

## 2016-02-10 DIAGNOSIS — Z86718 Personal history of other venous thrombosis and embolism: Secondary | ICD-10-CM | POA: Diagnosis not present

## 2016-02-10 DIAGNOSIS — Z79891 Long term (current) use of opiate analgesic: Secondary | ICD-10-CM | POA: Diagnosis not present

## 2016-02-10 DIAGNOSIS — D509 Iron deficiency anemia, unspecified: Secondary | ICD-10-CM | POA: Diagnosis not present

## 2016-02-10 DIAGNOSIS — E1140 Type 2 diabetes mellitus with diabetic neuropathy, unspecified: Secondary | ICD-10-CM | POA: Diagnosis not present

## 2016-02-10 DIAGNOSIS — M48061 Spinal stenosis, lumbar region without neurogenic claudication: Secondary | ICD-10-CM | POA: Diagnosis not present

## 2016-02-10 DIAGNOSIS — Z9181 History of falling: Secondary | ICD-10-CM | POA: Diagnosis not present

## 2016-02-10 DIAGNOSIS — I10 Essential (primary) hypertension: Secondary | ICD-10-CM | POA: Diagnosis not present

## 2016-02-10 DIAGNOSIS — W19XXXD Unspecified fall, subsequent encounter: Secondary | ICD-10-CM | POA: Diagnosis not present

## 2016-02-11 DIAGNOSIS — M48061 Spinal stenosis, lumbar region without neurogenic claudication: Secondary | ICD-10-CM | POA: Diagnosis not present

## 2016-02-11 DIAGNOSIS — S42351D Displaced comminuted fracture of shaft of humerus, right arm, subsequent encounter for fracture with routine healing: Secondary | ICD-10-CM | POA: Diagnosis not present

## 2016-02-11 DIAGNOSIS — I10 Essential (primary) hypertension: Secondary | ICD-10-CM | POA: Diagnosis not present

## 2016-02-11 DIAGNOSIS — Z7901 Long term (current) use of anticoagulants: Secondary | ICD-10-CM | POA: Diagnosis not present

## 2016-02-11 DIAGNOSIS — E1140 Type 2 diabetes mellitus with diabetic neuropathy, unspecified: Secondary | ICD-10-CM | POA: Diagnosis not present

## 2016-02-11 DIAGNOSIS — Z86718 Personal history of other venous thrombosis and embolism: Secondary | ICD-10-CM | POA: Diagnosis not present

## 2016-02-11 DIAGNOSIS — Z9181 History of falling: Secondary | ICD-10-CM | POA: Diagnosis not present

## 2016-02-11 DIAGNOSIS — W19XXXD Unspecified fall, subsequent encounter: Secondary | ICD-10-CM | POA: Diagnosis not present

## 2016-02-11 DIAGNOSIS — S42252D Displaced fracture of greater tuberosity of left humerus, subsequent encounter for fracture with routine healing: Secondary | ICD-10-CM | POA: Diagnosis not present

## 2016-02-11 DIAGNOSIS — Z86711 Personal history of pulmonary embolism: Secondary | ICD-10-CM | POA: Diagnosis not present

## 2016-02-11 DIAGNOSIS — Z79891 Long term (current) use of opiate analgesic: Secondary | ICD-10-CM | POA: Diagnosis not present

## 2016-02-11 DIAGNOSIS — D509 Iron deficiency anemia, unspecified: Secondary | ICD-10-CM | POA: Diagnosis not present

## 2016-02-12 ENCOUNTER — Telehealth (INDEPENDENT_AMBULATORY_CARE_PROVIDER_SITE_OTHER): Payer: Self-pay | Admitting: Orthopedic Surgery

## 2016-02-12 DIAGNOSIS — M48061 Spinal stenosis, lumbar region without neurogenic claudication: Secondary | ICD-10-CM | POA: Diagnosis not present

## 2016-02-12 DIAGNOSIS — E1140 Type 2 diabetes mellitus with diabetic neuropathy, unspecified: Secondary | ICD-10-CM | POA: Diagnosis not present

## 2016-02-12 DIAGNOSIS — Z86718 Personal history of other venous thrombosis and embolism: Secondary | ICD-10-CM | POA: Diagnosis not present

## 2016-02-12 DIAGNOSIS — Z7901 Long term (current) use of anticoagulants: Secondary | ICD-10-CM | POA: Diagnosis not present

## 2016-02-12 DIAGNOSIS — S42252D Displaced fracture of greater tuberosity of left humerus, subsequent encounter for fracture with routine healing: Secondary | ICD-10-CM | POA: Diagnosis not present

## 2016-02-12 DIAGNOSIS — W19XXXD Unspecified fall, subsequent encounter: Secondary | ICD-10-CM | POA: Diagnosis not present

## 2016-02-12 DIAGNOSIS — I10 Essential (primary) hypertension: Secondary | ICD-10-CM | POA: Diagnosis not present

## 2016-02-12 DIAGNOSIS — D509 Iron deficiency anemia, unspecified: Secondary | ICD-10-CM | POA: Diagnosis not present

## 2016-02-12 DIAGNOSIS — Z9181 History of falling: Secondary | ICD-10-CM | POA: Diagnosis not present

## 2016-02-12 DIAGNOSIS — Z79891 Long term (current) use of opiate analgesic: Secondary | ICD-10-CM | POA: Diagnosis not present

## 2016-02-12 DIAGNOSIS — S42351D Displaced comminuted fracture of shaft of humerus, right arm, subsequent encounter for fracture with routine healing: Secondary | ICD-10-CM | POA: Diagnosis not present

## 2016-02-12 DIAGNOSIS — Z86711 Personal history of pulmonary embolism: Secondary | ICD-10-CM | POA: Diagnosis not present

## 2016-02-12 NOTE — Telephone Encounter (Signed)
Please advise 

## 2016-02-12 NOTE — Telephone Encounter (Signed)
Malori with [facility] called asked if patient is ready to start (OT) unless doctor want her to start immediate exercise program.   The number to contact her is [phone]

## 2016-02-13 DIAGNOSIS — Z86718 Personal history of other venous thrombosis and embolism: Secondary | ICD-10-CM | POA: Diagnosis not present

## 2016-02-13 DIAGNOSIS — Z86711 Personal history of pulmonary embolism: Secondary | ICD-10-CM | POA: Diagnosis not present

## 2016-02-13 DIAGNOSIS — S42351D Displaced comminuted fracture of shaft of humerus, right arm, subsequent encounter for fracture with routine healing: Secondary | ICD-10-CM | POA: Diagnosis not present

## 2016-02-13 DIAGNOSIS — W19XXXD Unspecified fall, subsequent encounter: Secondary | ICD-10-CM | POA: Diagnosis not present

## 2016-02-13 DIAGNOSIS — Z79891 Long term (current) use of opiate analgesic: Secondary | ICD-10-CM | POA: Diagnosis not present

## 2016-02-13 DIAGNOSIS — I10 Essential (primary) hypertension: Secondary | ICD-10-CM | POA: Diagnosis not present

## 2016-02-13 DIAGNOSIS — E1140 Type 2 diabetes mellitus with diabetic neuropathy, unspecified: Secondary | ICD-10-CM | POA: Diagnosis not present

## 2016-02-13 DIAGNOSIS — M48061 Spinal stenosis, lumbar region without neurogenic claudication: Secondary | ICD-10-CM | POA: Diagnosis not present

## 2016-02-13 DIAGNOSIS — S42252D Displaced fracture of greater tuberosity of left humerus, subsequent encounter for fracture with routine healing: Secondary | ICD-10-CM | POA: Diagnosis not present

## 2016-02-13 DIAGNOSIS — Z9181 History of falling: Secondary | ICD-10-CM | POA: Diagnosis not present

## 2016-02-13 DIAGNOSIS — Z7901 Long term (current) use of anticoagulants: Secondary | ICD-10-CM | POA: Diagnosis not present

## 2016-02-13 DIAGNOSIS — D509 Iron deficiency anemia, unspecified: Secondary | ICD-10-CM | POA: Diagnosis not present

## 2016-02-13 NOTE — Telephone Encounter (Signed)
IC LMVM for [facility] and advised.

## 2016-02-13 NOTE — Telephone Encounter (Signed)
Ok for ot for rom pls clalthx prom

## 2016-02-14 ENCOUNTER — Telehealth (INDEPENDENT_AMBULATORY_CARE_PROVIDER_SITE_OTHER): Payer: Self-pay | Admitting: Orthopedic Surgery

## 2016-02-14 NOTE — Telephone Encounter (Signed)
Patient does not qualify for home health due to not being home bound. If patient wants therapy it can done as outpatient  Contact Info: Mallory, [facility] [phone]

## 2016-02-14 NOTE — Telephone Encounter (Signed)
See note, please advise on OP PT, patient lives in [place].

## 2016-02-14 NOTE — Telephone Encounter (Signed)
rx done pls cla lthx

## 2016-02-17 NOTE — Telephone Encounter (Signed)
I called pt and her numbers are not in service, I will discuss with her at her 02/18/16 appt.  IC Mallory as well, she does not have another number for pt.

## 2016-02-18 ENCOUNTER — Ambulatory Visit (INDEPENDENT_AMBULATORY_CARE_PROVIDER_SITE_OTHER): Payer: Medicare Other | Admitting: Family Medicine

## 2016-02-18 ENCOUNTER — Encounter: Payer: Self-pay | Admitting: Family Medicine

## 2016-02-18 VITALS — BP 130/76 | HR 70 | Temp 97.8°F | Resp 16 | Ht 62.0 in | Wt 268.0 lb

## 2016-02-18 DIAGNOSIS — M810 Age-related osteoporosis without current pathological fracture: Secondary | ICD-10-CM | POA: Insufficient documentation

## 2016-02-18 DIAGNOSIS — E1149 Type 2 diabetes mellitus with other diabetic neurological complication: Secondary | ICD-10-CM

## 2016-02-18 DIAGNOSIS — S42209S Unspecified fracture of upper end of unspecified humerus, sequela: Secondary | ICD-10-CM

## 2016-02-18 DIAGNOSIS — M797 Fibromyalgia: Secondary | ICD-10-CM | POA: Diagnosis not present

## 2016-02-18 DIAGNOSIS — E038 Other specified hypothyroidism: Secondary | ICD-10-CM

## 2016-02-18 DIAGNOSIS — I10 Essential (primary) hypertension: Secondary | ICD-10-CM | POA: Diagnosis not present

## 2016-02-18 LAB — COMPREHENSIVE METABOLIC PANEL
ALT: 8 U/L (ref 6–29)
AST: 14 U/L (ref 10–35)
Albumin: 3.8 g/dL (ref 3.6–5.1)
Alkaline Phosphatase: 107 U/L (ref 33–130)
BILIRUBIN TOTAL: 0.3 mg/dL (ref 0.2–1.2)
BUN: 11 mg/dL (ref 7–25)
CO2: 25 mmol/L (ref 20–31)
CREATININE: 0.93 mg/dL (ref 0.50–0.99)
Calcium: 9.7 mg/dL (ref 8.6–10.4)
Chloride: 97 mmol/L — ABNORMAL LOW (ref 98–110)
GLUCOSE: 96 mg/dL (ref 70–99)
Potassium: 4.1 mmol/L (ref 3.5–5.3)
SODIUM: 132 mmol/L — AB (ref 135–146)
Total Protein: 6.8 g/dL (ref 6.1–8.1)

## 2016-02-18 LAB — CBC WITH DIFFERENTIAL/PLATELET
Basophils Absolute: 0 cells/uL (ref 0–200)
Basophils Relative: 0 %
EOS PCT: 3 %
Eosinophils Absolute: 201 cells/uL (ref 15–500)
HCT: 36.9 % (ref 35.0–45.0)
HEMOGLOBIN: 11.5 g/dL — AB (ref 12.0–15.0)
LYMPHS ABS: 1541 {cells}/uL (ref 850–3900)
LYMPHS PCT: 23 %
MCH: 27.6 pg (ref 27.0–33.0)
MCHC: 31.2 g/dL — AB (ref 32.0–36.0)
MCV: 88.5 fL (ref 80.0–100.0)
MPV: 10.2 fL (ref 7.5–12.5)
Monocytes Absolute: 536 cells/uL (ref 200–950)
Monocytes Relative: 8 %
NEUTROS PCT: 66 %
Neutro Abs: 4422 cells/uL (ref 1500–7800)
PLATELETS: 214 10*3/uL (ref 140–400)
RBC: 4.17 MIL/uL (ref 3.80–5.10)
RDW: 15.3 % — ABNORMAL HIGH (ref 11.0–15.0)
WBC: 6.7 10*3/uL (ref 3.8–10.8)

## 2016-02-18 LAB — T4, FREE: Free T4: 0.8 ng/dL (ref 0.8–1.8)

## 2016-02-18 LAB — HEMOGLOBIN A1C
Hgb A1c MFr Bld: 5.1 % (ref <5.7)
Mean Plasma Glucose: 100 mg/dL

## 2016-02-18 LAB — TSH: TSH: 11.37 mIU/L — ABNORMAL HIGH

## 2016-02-18 LAB — T3, FREE: T3, Free: 2.1 pg/mL — ABNORMAL LOW (ref 2.3–4.2)

## 2016-02-18 MED ORDER — TRAZODONE HCL 100 MG PO TABS: 100.0000 mg | ORAL_TABLET | Freq: Every day | ORAL | 3 refills | Status: DC

## 2016-02-18 MED ORDER — HYDROCHLOROTHIAZIDE 25 MG PO TABS: ORAL_TABLET | ORAL | 3 refills | Status: DC

## 2016-02-18 MED ORDER — PROMETHAZINE HCL 12.5 MG PO TABS: 12.5000 mg | ORAL_TABLET | Freq: Three times a day (TID) | ORAL | 1 refills | Status: DC | PRN

## 2016-02-18 MED ORDER — HYDROMORPHONE HCL 2 MG PO TABS: ORAL_TABLET | ORAL | 0 refills | Status: DC

## 2016-02-18 MED ORDER — LINACLOTIDE 72 MCG PO CAPS: 72.0000 ug | ORAL_CAPSULE | Freq: Every day | ORAL | 11 refills | Status: DC

## 2016-02-18 MED ORDER — SIMVASTATIN 40 MG PO TABS: 40.0000 mg | ORAL_TABLET | Freq: Every day | ORAL | 2 refills | Status: DC

## 2016-02-18 MED ORDER — POTASSIUM CHLORIDE ER 10 MEQ PO CPCR: 10.0000 meq | ORAL_CAPSULE | Freq: Every day | ORAL | 3 refills | Status: DC

## 2016-02-18 MED ORDER — BUSPIRONE HCL 10 MG PO TABS: 10.0000 mg | ORAL_TABLET | Freq: Three times a day (TID) | ORAL | 3 refills | Status: DC

## 2016-02-18 MED ORDER — RANITIDINE HCL 150 MG PO TABS: 150.0000 mg | ORAL_TABLET | Freq: Every day | ORAL | 3 refills | Status: DC

## 2016-02-18 MED ORDER — METHOCARBAMOL 500 MG PO TABS: 500.0000 mg | ORAL_TABLET | Freq: Two times a day (BID) | ORAL | 1 refills | Status: DC

## 2016-02-18 MED ORDER — DEXLANSOPRAZOLE 60 MG PO CPDR: 1.0000 | DELAYED_RELEASE_CAPSULE | Freq: Every day | ORAL | 3 refills | Status: DC

## 2016-02-18 MED ORDER — METFORMIN HCL ER 500 MG PO TB24: ORAL_TABLET | ORAL | 3 refills | Status: DC

## 2016-02-18 NOTE — Progress Notes (Signed)
Subjective:    Patient ID: Yesenia [name], female    DOB: 02-03-1951, 65 y.o.   MRN: [id number]  Patient presents for Hospital/ Rehab F/U (s/p fall 10/5 with multiple fx to B clavicle and B humerus) Patient here for hospital follow-up. On October 5 she had a fall after losing her balance with her arms outstretched resulting in multiple fractures including her clavicle in her humerus. She initially had her shoulder reduced after she had dislocation on the left side. She then had to have ORIF performed on 10/17  Left shoulder, Right shoulder ORIF  10/11 She had repair performed on October 11 by Dr. Marlou Sa. She  was sent to Rehab for 2 weeks, out since Oct 31st. Son is helping [employer]), no current physical therapy at home, has appt to see Dr. Marlou Sa tomorrow 11/15. Pain control- was on Dilaudid states given 30 tablets also given Robaxin out of this as well     Diabetes mellitus last A1c 6.4% she is taking her home regimen, highest CBG 147  Hypothyroidism due for repeat thyroid function study she is taking medication as prescribed, TSH was midlly elevated during hospitalization   Blood pressure was controlled throughout surgery and hospitalization  Review Of Systems:  GEN- denies fatigue, fever, weight loss,weakness, recent illness HEENT- denies eye drainage, change in vision, nasal discharge, CVS- denies chest pain, palpitations RESP- denies SOB, cough, wheeze ABD- denies N/V, change in stools, abd pain GU- denies dysuria, hematuria, dribbling, incontinence MSK- +joint pain, muscle aches, injury Neuro- denies headache, dizziness, syncope, seizure activity       Objective:    BP 130/76 (BP Location: Left Arm, Patient Position: Sitting, Cuff Size: Large)   Pulse 70   Temp 97.8 F (36.6 C) (Oral)   Resp 16   Ht 5\' 2"  (1.575 m)   Wt 268 lb (121.6 kg)   SpO2 97%   BMI 49.02 kg/m  GEN- NAD, alert and oriented x3 HEENT- PERRL, EOMI, non injected sclera, pink conjunctiva, MMM,  oropharynx clear Neck- Supple, no thyromegaly CVS- RRR, no murmur RESP-CTAB EXT- No edema, incisions bilat shoulders d/c/i  Pulses- Radial, DP- 2+        Assessment & Plan:      Problem List Items Addressed This Visit    Type II diabetes mellitus with neurological manifestations ([place]) - Primary    Diabetes has been well controlled no changes       Relevant Medications   metFORMIN (GLUCOPHAGE-XR) 500 MG 24 hr tablet   simvastatin (ZOCOR) 40 MG tablet   Other Relevant Orders   CBC with Differential/Platelet   Comprehensive metabolic panel   Hemoglobin A1c   Osteoporosis    Pathologiocal fractures, indicating Osteoporosis Vitamin D increased to 1000IU daily Calcium 1200mg ,  Plan for Bone Density plan to treat with bisphosphonate      Relevant Orders   DG Bone Density   Hypothyroidism    Recheck TFT       Relevant Orders   TSH   T3, free   T4, free   Humerus fracture   Fibromyalgia    Continue Lyrica      Essential hypertension, benign    Controlled, no changes to medications       Relevant Medications   hydrochlorothiazide (HYDRODIURIL) 25 MG tablet   simvastatin (ZOCOR) 40 MG tablet      Note: This dictation was prepared with Dragon dictation along with smaller phrase technology. Any transcriptional errors that result from this process are unintentional.

## 2016-02-18 NOTE — Assessment & Plan Note (Signed)
Refilled dilaudid at 60 tablets Defer to surgeon on lovenox and Physical therapy

## 2016-02-18 NOTE — Assessment & Plan Note (Addendum)
Pathologiocal fractures, indicating Osteoporosis Vitamin D increased to 1000IU daily Calcium 1200mg ,  Plan for Bone Density plan to treat with bisphosphonate

## 2016-02-18 NOTE — Assessment & Plan Note (Signed)
Continue Lyrica. 

## 2016-02-18 NOTE — Patient Instructions (Addendum)
Bone Density to be set up Pain medication refilled Calcium 1200mg  and Vitamin D 1000IU Take the Iron tablet once a day  F/U 3 months

## 2016-02-18 NOTE — Assessment & Plan Note (Signed)
Recheck TFT 

## 2016-02-18 NOTE — Assessment & Plan Note (Signed)
Diabetes has been well controlled no changes

## 2016-02-18 NOTE — Assessment & Plan Note (Signed)
Controlled, no changes to medications

## 2016-02-19 ENCOUNTER — Ambulatory Visit (INDEPENDENT_AMBULATORY_CARE_PROVIDER_SITE_OTHER): Payer: Medicare Other | Admitting: Orthopedic Surgery

## 2016-02-19 ENCOUNTER — Ambulatory Visit (INDEPENDENT_AMBULATORY_CARE_PROVIDER_SITE_OTHER): Payer: Medicare Other

## 2016-02-19 ENCOUNTER — Encounter (INDEPENDENT_AMBULATORY_CARE_PROVIDER_SITE_OTHER): Payer: Self-pay | Admitting: Orthopedic Surgery

## 2016-02-19 ENCOUNTER — Other Ambulatory Visit: Payer: Self-pay | Admitting: Family Medicine

## 2016-02-19 ENCOUNTER — Ambulatory Visit (INDEPENDENT_AMBULATORY_CARE_PROVIDER_SITE_OTHER): Payer: Self-pay

## 2016-02-19 VITALS — Ht 61.0 in | Wt 264.0 lb

## 2016-02-19 DIAGNOSIS — S42201D Unspecified fracture of upper end of right humerus, subsequent encounter for fracture with routine healing: Secondary | ICD-10-CM

## 2016-02-19 DIAGNOSIS — S42202D Unspecified fracture of upper end of left humerus, subsequent encounter for fracture with routine healing: Secondary | ICD-10-CM

## 2016-02-19 DIAGNOSIS — M810 Age-related osteoporosis without current pathological fracture: Secondary | ICD-10-CM
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
## 2016-02-24 ENCOUNTER — Other Ambulatory Visit: Payer: Self-pay | Admitting: *Deleted

## 2016-02-24 DIAGNOSIS — E039 Hypothyroidism, unspecified: Secondary | ICD-10-CM

## 2016-02-24 MED ORDER — LEVOTHYROXINE SODIUM 150 MCG PO TABS: 150.0000 ug | ORAL_TABLET | Freq: Every day | ORAL | 3 refills | Status: DC

## 2016-03-02 ENCOUNTER — Other Ambulatory Visit (HOSPITAL_COMMUNITY): Payer: Medicare Other

## 2016-03-03 ENCOUNTER — Encounter (HOSPITAL_COMMUNITY)
Admission: AD | Admit: 2016-03-03 | Discharge: 2016-03-03 | Disposition: A | Discharge status: Home / Self Care | Payer: Medicare Other | Source: Skilled Nursing Facility | Attending: Internal Medicine | Admitting: Internal Medicine

## 2016-03-05 ENCOUNTER — Ambulatory Visit: Payer: Medicare Other | Admitting: Gastroenterology

## 2016-03-11 ENCOUNTER — Ambulatory Visit (HOSPITAL_COMMUNITY)
Admission: RE | Admit: 2016-03-11 | Discharge: 2016-03-11 | Disposition: A | Discharge status: Home / Self Care | Payer: Medicare Other | Source: Ambulatory Visit | Attending: Family Medicine | Admitting: Family Medicine

## 2016-03-11 DIAGNOSIS — M810 Age-related osteoporosis without current pathological fracture: Secondary | ICD-10-CM | POA: Diagnosis not present

## 2016-03-11 DIAGNOSIS — Z780 Asymptomatic menopausal state: Secondary | ICD-10-CM | POA: Diagnosis not present

## 2016-03-11 DIAGNOSIS — M818 Other osteoporosis without current pathological fracture: Secondary | ICD-10-CM | POA: Diagnosis not present

## 2016-03-12 ENCOUNTER — Encounter: Payer: Self-pay | Admitting: *Deleted

## 2016-03-12 ENCOUNTER — Other Ambulatory Visit: Payer: Self-pay | Admitting: *Deleted

## 2016-03-12 MED ORDER — ALENDRONATE SODIUM 70 MG PO TABS: 70.0000 mg | ORAL_TABLET | ORAL | 11 refills | Status: DC

## 2016-03-17 ENCOUNTER — Encounter: Payer: Self-pay | Admitting: Gastroenterology

## 2016-03-17 ENCOUNTER — Telehealth: Payer: Self-pay | Admitting: *Deleted

## 2016-03-17 ENCOUNTER — Telehealth: Payer: Self-pay | Admitting: Gastroenterology

## 2016-03-17 ENCOUNTER — Ambulatory Visit: Payer: Medicare Other | Admitting: Gastroenterology

## 2016-03-17 MED ORDER — HYDROMORPHONE HCL 2 MG PO TABS: ORAL_TABLET | ORAL | 0 refills | Status: DC

## 2016-03-17 NOTE — Telephone Encounter (Signed)
Prescription printed and patient son made aware to come to office to pick up on 03/18/2016.

## 2016-03-17 NOTE — Telephone Encounter (Signed)
Noted ortho had to cancel her appt for tomorrow,  therefore will give 1 more refill, further pain meds need to come from ortho, until they release her

## 2016-03-17 NOTE — Telephone Encounter (Signed)
Received call from patient.   Requested refill on Dilaudid.   Ok to refill??  Last office visit/ refill 02/18/2016.

## 2016-03-17 NOTE — Telephone Encounter (Signed)
Pt was a no show and letter sent  °

## 2016-03-18 ENCOUNTER — Ambulatory Visit (INDEPENDENT_AMBULATORY_CARE_PROVIDER_SITE_OTHER): Payer: Medicare Other | Admitting: Orthopedic Surgery

## 2016-03-19 ENCOUNTER — Other Ambulatory Visit: Payer: Self-pay | Admitting: Family Medicine

## 2016-03-20 ENCOUNTER — Telehealth: Payer: Self-pay | Admitting: *Deleted

## 2016-03-20 MED ORDER — PREGABALIN 100 MG PO CAPS: 100.0000 mg | ORAL_CAPSULE | Freq: Three times a day (TID) | ORAL | 3 refills | Status: DC

## 2016-03-20 NOTE — Telephone Encounter (Signed)
Okay to refill? 

## 2016-03-20 NOTE — Telephone Encounter (Signed)
Medication called to pharmacy. 

## 2016-03-20 NOTE — Telephone Encounter (Signed)
Received fax requesting refill on Lyrica.   Ok to refill??  Last office visit 02/18/2016.  Last refill 12/26/2015, #3 refills.

## 2016-03-24 ENCOUNTER — Telehealth (INDEPENDENT_AMBULATORY_CARE_PROVIDER_SITE_OTHER): Payer: Self-pay | Admitting: Radiology

## 2016-03-24 NOTE — Telephone Encounter (Signed)
[facility] called, and said that patient came to her Initial Eval and has not been to PT since.  FYI

## 2016-04-15 ENCOUNTER — Ambulatory Visit (INDEPENDENT_AMBULATORY_CARE_PROVIDER_SITE_OTHER): Payer: Medicare Other | Admitting: Orthopedic Surgery

## 2016-04-29 ENCOUNTER — Telehealth: Payer: Self-pay | Admitting: *Deleted

## 2016-04-29 NOTE — Telephone Encounter (Signed)
Received call from patient.   Requested refill on dilaudid.   Ok to refill??  Last office visit 02/18/2016.  Last refill 03/17/2016.

## 2016-04-29 NOTE — Telephone Encounter (Signed)
Call placed to patient and patient made aware.   Patient will F/U with Ortho and contact our office to keep in loop.

## 2016-04-29 NOTE — Telephone Encounter (Signed)
Please call pt I advised back in Dec that I would give that last refill of dilaudid and that her pain meds had to come from ortho. If she is no longer seeing them, then I will put her back on the Norco 10-325mg  1 po q 6 hrs, #60 tabs

## 2016-05-20 ENCOUNTER — Ambulatory Visit (INDEPENDENT_AMBULATORY_CARE_PROVIDER_SITE_OTHER): Payer: Medicare Other | Admitting: Family Medicine

## 2016-05-20 ENCOUNTER — Encounter: Payer: Self-pay | Admitting: Family Medicine

## 2016-05-20 VITALS — BP 134/78 | HR 82 | Temp 98.0°F | Resp 14 | Ht 61.0 in | Wt 253.0 lb

## 2016-05-20 DIAGNOSIS — E1149 Type 2 diabetes mellitus with other diabetic neurological complication: Secondary | ICD-10-CM

## 2016-05-20 DIAGNOSIS — I10 Essential (primary) hypertension: Secondary | ICD-10-CM

## 2016-05-20 DIAGNOSIS — E785 Hyperlipidemia, unspecified: Secondary | ICD-10-CM

## 2016-05-20 DIAGNOSIS — E038 Other specified hypothyroidism: Secondary | ICD-10-CM | POA: Diagnosis not present

## 2016-05-20 DIAGNOSIS — F3340 Major depressive disorder, recurrent, in remission, unspecified: Secondary | ICD-10-CM

## 2016-05-20 DIAGNOSIS — E119 Type 2 diabetes mellitus without complications: Secondary | ICD-10-CM

## 2016-05-20 DIAGNOSIS — E039 Hypothyroidism, unspecified: Secondary | ICD-10-CM | POA: Diagnosis not present

## 2016-05-20 DIAGNOSIS — M48061 Spinal stenosis, lumbar region without neurogenic claudication: Secondary | ICD-10-CM | POA: Diagnosis not present

## 2016-05-20 MED ORDER — HYDROCODONE-ACETAMINOPHEN 10-325 MG PO TABS: 1.0000 | ORAL_TABLET | Freq: Three times a day (TID) | ORAL | 0 refills | Status: DC | PRN

## 2016-05-20 MED ORDER — TRAZODONE HCL 100 MG PO TABS: ORAL_TABLET | ORAL | 6 refills | Status: DC

## 2016-05-20 NOTE — Patient Instructions (Signed)
F/U 4 months  Please schedule with your surgeon for check up Referral to psychiatry

## 2016-05-20 NOTE — Assessment & Plan Note (Signed)
Pressure controlled medication medication Thyroid function studies to be checked

## 2016-05-20 NOTE — Assessment & Plan Note (Signed)
Diabetes has been well controlled she is off of medication at this time. She continues to lose weight intentionally.

## 2016-05-20 NOTE — Assessment & Plan Note (Signed)
Increase her trazodone to 150 mg at bedtime. Continue the BuSpar. I will get her set up to see psychiatrist and therapist.

## 2016-05-20 NOTE — Progress Notes (Signed)
Subjective:    Patient ID: Yesenia [name], female    DOB: 02-08-1951, 66 y.o.   MRN: [id number]  Patient presents for 4 month F/U (is fasting)     DM- last 5.1%, Nov 2017 currently diet controlled, stopped metformin last check  working on weight  Loss, cut down soft drinks     Hyperlipidemia- last cholesterol Feb 2017, LDL 90  HTN- no difficulty with medications, controlled     Chronic pain- She's been out of pain medicine for the past month. States that she did not have transportation to get back to her surgeon to have her previous medication refill. She is having pain in her back she has known stenosis she also had recent fracture treated.   Hypothyroidism- currently in 155mcg, due for repeat TFT   Medications reviewed    Osteoporosis after fracture- currently on fosamax   She requests to go back to see a psychiatrist. States there's been a lot of stress she does not talk to her daughter left for [place] last year she thinks that she may be mixed up and some bad things. Her son and his family one that looks in on her has multiple sclerosis and this has been declining. She is not sleeping well at night often staying up to form 5:00 in the morning. She's taken the BuSpar pill at the anxiety is controlled but not the depression or sleep. She is currently on trazodone 100 mg at bedtime.  Review Of Systems:  GEN- denies fatigue, fever, weight loss,weakness, recent illness HEENT- denies eye drainage, change in vision, nasal discharge, CVS- denies chest pain, palpitations RESP- denies SOB, cough, wheeze ABD- denies N/V, change in stools, abd pain GU- denies dysuria, hematuria, dribbling, incontinence MSK- + joint pain, muscle aches, injury Neuro- denies headache, dizziness, syncope, seizure activity       Objective:    BP 134/78   Pulse 82   Temp 98 F (36.7 C) (Oral)   Resp 14   Ht 5\' 1"  (1.549 m)   Wt 253 lb (114.8 kg)   SpO2 94%   BMI 47.80 kg/m  GEN- NAD,  alert and oriented x3 HEENT- PERRL, EOMI, non injected sclera, pink conjunctiva, MMM, oropharynx clear Neck- Supple, no thyromegaly CVS- RRR, no murmur RESP-CTAB Psych- normal affect and mood , no SI, normal speech  EXT- No edema Pulses- Radial, DP- 2+        Assessment & Plan:      Problem List Items Addressed This Visit    Type II diabetes mellitus with neurological manifestations ([place])    Diabetes has been well controlled she is off of medication at this time. She continues to lose weight intentionally.       Relevant Orders   Microalbumin / creatinine urine ratio   SPINAL STENOSIS, LUMBAR    Spinal stenosis due to degenerative disc disease previous surgery. I will put her back on hydrocodone prescribed 45 tablets. She is to reschedule with her surgeon      MDD (major depressive disorder)    Increase her trazodone to 150 mg at bedtime. Continue the BuSpar. I will get her set up to see psychiatrist and therapist.      Relevant Medications   traZODone (DESYREL) 100 MG tablet   Other Relevant Orders   Ambulatory referral to Psychiatry   Hypothyroidism   Hyperlipemia   Essential hypertension, benign - Primary    Pressure controlled medication medication Thyroid function studies to be checked  Other Visit Diagnoses    Controlled diabetes mellitus type II without complication [facility])          Note: This dictation was prepared with Dragon dictation along with smaller phrase technology. Any transcriptional errors that result from this process are unintentional.

## 2016-05-20 NOTE — Assessment & Plan Note (Signed)
Spinal stenosis due to degenerative disc disease previous surgery. I will put her back on hydrocodone prescribed 45 tablets. She is to reschedule with her surgeon

## 2016-05-21 ENCOUNTER — Other Ambulatory Visit: Payer: Self-pay | Admitting: Family Medicine

## 2016-05-21 LAB — MICROALBUMIN / CREATININE URINE RATIO
CREATININE, URINE: 218 mg/dL (ref 20–320)
Microalb Creat Ratio: 4 mcg/mg creat (ref <30)
Microalb, Ur: 0.8 mg/dL

## 2016-05-21 LAB — T4, FREE: FREE T4: 1 ng/dL (ref 0.8–1.8)

## 2016-05-21 LAB — LIPID PANEL
Cholesterol: 198 mg/dL (ref <200)
HDL: 65 mg/dL (ref >50)
LDL CALC: 110 mg/dL — AB (ref <100)
Total CHOL/HDL Ratio: 3 Ratio (ref <5.0)
Triglycerides: 117 mg/dL (ref <150)
VLDL: 23 mg/dL (ref <30)

## 2016-05-21 LAB — TSH: TSH: 12.05 mIU/L — ABNORMAL HIGH

## 2016-05-21 LAB — T3, FREE: T3 FREE: 2.9 pg/mL (ref 2.3–4.2)

## 2016-05-22 ENCOUNTER — Other Ambulatory Visit: Payer: Self-pay | Admitting: *Deleted

## 2016-05-22 DIAGNOSIS — E039 Hypothyroidism, unspecified: Secondary | ICD-10-CM

## 2016-05-22 MED ORDER — LEVOTHYROXINE SODIUM 175 MCG PO TABS: 175.0000 ug | ORAL_TABLET | Freq: Every day | ORAL | 3 refills | Status: DC

## 2016-05-25 ENCOUNTER — Telehealth (HOSPITAL_COMMUNITY): Payer: Self-pay | Admitting: *Deleted

## 2016-05-25 NOTE — Telephone Encounter (Signed)
phone call answered by next door neighbor.   she is not home.
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
## 2016-06-10 ENCOUNTER — Ambulatory Visit (INDEPENDENT_AMBULATORY_CARE_PROVIDER_SITE_OTHER): Payer: Medicare Other | Admitting: Psychiatry

## 2016-06-10 ENCOUNTER — Encounter (HOSPITAL_COMMUNITY): Payer: Self-pay | Admitting: Psychiatry

## 2016-06-10 VITALS — BP 130/102 | HR 72 | Ht 61.0 in | Wt 255.0 lb

## 2016-06-10 DIAGNOSIS — Z818 Family history of other mental and behavioral disorders: Secondary | ICD-10-CM

## 2016-06-10 DIAGNOSIS — F331 Major depressive disorder, recurrent, moderate: Secondary | ICD-10-CM

## 2016-06-10 DIAGNOSIS — Z888 Allergy status to other drugs, medicaments and biological substances status: Secondary | ICD-10-CM

## 2016-06-10 DIAGNOSIS — Z79899 Other long term (current) drug therapy: Secondary | ICD-10-CM

## 2016-06-10 MED ORDER — HYDROXYZINE HCL 25 MG PO TABS: 25.0000 mg | ORAL_TABLET | Freq: Two times a day (BID) | ORAL | 1 refills | Status: DC | PRN

## 2016-06-10 MED ORDER — DULOXETINE HCL 30 MG PO CPEP: ORAL_CAPSULE | ORAL | 1 refills | Status: DC

## 2016-06-10 NOTE — Patient Instructions (Signed)
1. Start duloxetine 30 mg daily for two weeks, then increase to 60 mg daily 2. Start hydroxyzine 25 mg twice a day as needed for anxiety 3. Return to clinic in one month 4. Contact for therapy: Dr. Alford Highland Schneidmiller  [phone] [address], [place], [place] [address]

## 2016-06-23 ENCOUNTER — Other Ambulatory Visit: Payer: Self-pay | Admitting: Family Medicine
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
## 2016-07-09 ENCOUNTER — Encounter (HOSPITAL_COMMUNITY): Payer: Self-pay

## 2016-07-09 ENCOUNTER — Ambulatory Visit (HOSPITAL_COMMUNITY): Payer: Self-pay | Admitting: Psychiatry

## 2016-07-10 ENCOUNTER — Ambulatory Visit (HOSPITAL_COMMUNITY): Payer: Self-pay | Admitting: Psychiatry

## 2016-07-13 ENCOUNTER — Telehealth (HOSPITAL_COMMUNITY): Payer: Self-pay | Admitting: *Deleted

## 2016-07-13 NOTE — Telephone Encounter (Signed)
Per pt message she left on voicemail on Friday, April, 6, 2018, staff tried to call pt to get more informed at [phone]. Pt phone kept ringing and and a message came up stating that the wireless customer is not answer and to please try call at a later time. Staff called at 7:53 am on 07-13-2016.

## 2016-07-13 NOTE — Telephone Encounter (Signed)
voice message from patient on 07/10/16, patient was rude, said she had an appointment at 1:00 on 07/10/16, said she was at the door and no one was in the office.   She said she dont want to deal with you incompetent people, she said she would rather deal with it herself instead of dealing  with you people.   She said, so this is what I am going to do.   I want you to close my file, and I don't want you calling me or nothing.    When looking at the patient's schedule, she was first seen on 06/10/16, scheduled for 07/10/16, but cancelled that appointment.  She rescheduled for 4/5 at 1:15, but did not show.

## 2016-07-13 NOTE — Telephone Encounter (Signed)
voice message from patient on 07/10/16, patient was rude, said she had an appointment at 1:00, said she was at the door and no one was in the office.   She said she dont want to deal with you incompetent people, and this is what I am going to do, I want you to close my file, and I don't want you calling me or nothing.    Looking at the patient's appointment schedule, she was first seen on 06/10/16, did not show on 07/09/16, and was rescheduled for 07/10/16 at 9:30 a.m.

## 2016-07-14 ENCOUNTER — Other Ambulatory Visit: Payer: Self-pay | Admitting: Family Medicine

## 2016-07-22 ENCOUNTER — Telehealth (HOSPITAL_COMMUNITY): Payer: Self-pay | Admitting: *Deleted

## 2016-07-22 NOTE — Telephone Encounter (Signed)
Please call her and see if she plans to come back before we send in refill

## 2016-07-22 NOTE — Telephone Encounter (Signed)
Pt Yesenia [name] requesting refills for pt Yesenia [name] 25 mg BID PRN. Pt medication last filled on 06-10-2016 with 60 tabs 1 refills. Pt initial and only appt was 06-10-2016. Pt no showed for her f/u on 07-09-2016 and cancelled her appt for 07-10-2016. Pt do not have f/u appt with provider. Pt pharmacy number is [phone].

## 2016-07-23 NOTE — Telephone Encounter (Signed)
No refill unless patient contacts [place] to have follow up appointment.

## 2016-07-23 NOTE — Telephone Encounter (Signed)
Called pt to inform her with what provider stated about scheduling a f/u appt. Staff verified with pt if that was who was on the other line. Staff presented their name to pt and provider providers name to pt. Pt then asked who? Staff then stated this is Behavioral health and pt stated "I'm not interested" and hung up before staff could talk. Please advise staff as to what they should do about med refill for pt and f/u appt for pt.

## 2016-07-23 NOTE — Telephone Encounter (Signed)
noted 

## 2016-08-03 DIAGNOSIS — I10 Essential (primary) hypertension: Secondary | ICD-10-CM | POA: Diagnosis not present

## 2016-08-03 DIAGNOSIS — E039 Hypothyroidism, unspecified: Secondary | ICD-10-CM | POA: Diagnosis not present

## 2016-08-03 DIAGNOSIS — J069 Acute upper respiratory infection, unspecified: Secondary | ICD-10-CM | POA: Diagnosis not present

## 2016-08-03 DIAGNOSIS — R05 Cough: Secondary | ICD-10-CM | POA: Diagnosis not present

## 2016-08-03 DIAGNOSIS — J302 Other seasonal allergic rhinitis: Secondary | ICD-10-CM | POA: Diagnosis not present

## 2016-08-03 DIAGNOSIS — Z79899 Other long term (current) drug therapy: Secondary | ICD-10-CM | POA: Diagnosis not present

## 2016-08-03 DIAGNOSIS — Z86718 Personal history of other venous thrombosis and embolism: Secondary | ICD-10-CM | POA: Diagnosis not present

## 2016-08-03 DIAGNOSIS — Z86711 Personal history of pulmonary embolism: Secondary | ICD-10-CM | POA: Diagnosis not present

## 2016-08-03 DIAGNOSIS — J00 Acute nasopharyngitis [common cold]: Secondary | ICD-10-CM | POA: Diagnosis not present

## 2016-08-13 ENCOUNTER — Ambulatory Visit: Payer: Medicare Other | Admitting: Gastroenterology

## 2016-08-17 ENCOUNTER — Other Ambulatory Visit: Payer: Self-pay | Admitting: Family Medicine

## 2016-08-20 ENCOUNTER — Other Ambulatory Visit: Payer: Self-pay

## 2016-08-20 NOTE — Progress Notes (Signed)
This encounter was created in error - please disregard.

## 2016-08-20 NOTE — Patient Outreach (Signed)
[facility]) Care Management  08/20/2016  Yesenia [name] [birth year] [id number]   Medication Adherence to Yesenia [name] call Yesenia [name] because she is past due on her metformin er 500 mg spoke to Yesenia [name] she said doctor discontinue medication because her test result where good also call doctors office and spoke to doctor [facility] nurse and she confirm she is no longer on medication .08/20/16   [facility] Direct Dial [phone]  Fax [phone] [name]

## 2016-09-09 ENCOUNTER — Ambulatory Visit (INDEPENDENT_AMBULATORY_CARE_PROVIDER_SITE_OTHER): Payer: Medicare Other | Admitting: Orthopedic Surgery

## 2016-09-09 ENCOUNTER — Encounter (INDEPENDENT_AMBULATORY_CARE_PROVIDER_SITE_OTHER): Payer: Self-pay | Admitting: Orthopedic Surgery

## 2016-09-09 ENCOUNTER — Ambulatory Visit (INDEPENDENT_AMBULATORY_CARE_PROVIDER_SITE_OTHER): Payer: Medicare Other

## 2016-09-09 DIAGNOSIS — M5442 Lumbago with sciatica, left side: Secondary | ICD-10-CM

## 2016-09-09 DIAGNOSIS — M25511 Pain in right shoulder: Secondary | ICD-10-CM

## 2016-09-09 DIAGNOSIS — G8929 Other chronic pain: Secondary | ICD-10-CM

## 2016-09-09 DIAGNOSIS — M25512 Pain in left shoulder: Secondary | ICD-10-CM | POA: Diagnosis not present

## 2016-09-09 MED ORDER — METHOCARBAMOL 500 MG PO TABS: 500.0000 mg | ORAL_TABLET | Freq: Three times a day (TID) | ORAL | 0 refills | Status: DC | PRN

## 2016-09-09 MED ORDER — HYDROCODONE-ACETAMINOPHEN 5-325 MG PO TABS: 1.0000 | ORAL_TABLET | Freq: Three times a day (TID) | ORAL | 0 refills | Status: DC | PRN
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
## 2016-09-14 ENCOUNTER — Other Ambulatory Visit: Payer: Self-pay | Admitting: Family Medicine

## 2016-09-14 DIAGNOSIS — E039 Hypothyroidism, unspecified: Secondary | ICD-10-CM

## 2016-09-18 ENCOUNTER — Ambulatory Visit (INDEPENDENT_AMBULATORY_CARE_PROVIDER_SITE_OTHER): Payer: Medicare Other | Admitting: Family Medicine

## 2016-09-18 ENCOUNTER — Encounter: Payer: Self-pay | Admitting: Family Medicine

## 2016-09-18 VITALS — BP 138/72 | HR 84 | Temp 98.9°F | Resp 14 | Ht 61.0 in | Wt 259.0 lb

## 2016-09-18 DIAGNOSIS — M5136 Other intervertebral disc degeneration, lumbar region: Secondary | ICD-10-CM

## 2016-09-18 DIAGNOSIS — Z9889 Other specified postprocedural states: Secondary | ICD-10-CM

## 2016-09-18 DIAGNOSIS — N3941 Urge incontinence: Secondary | ICD-10-CM

## 2016-09-18 DIAGNOSIS — E1149 Type 2 diabetes mellitus with other diabetic neurological complication: Secondary | ICD-10-CM | POA: Diagnosis not present

## 2016-09-18 DIAGNOSIS — E785 Hyperlipidemia, unspecified: Secondary | ICD-10-CM

## 2016-09-18 DIAGNOSIS — E1143 Type 2 diabetes mellitus with diabetic autonomic (poly)neuropathy: Secondary | ICD-10-CM | POA: Diagnosis not present

## 2016-09-18 DIAGNOSIS — I10 Essential (primary) hypertension: Secondary | ICD-10-CM

## 2016-09-18 DIAGNOSIS — K760 Fatty (change of) liver, not elsewhere classified: Secondary | ICD-10-CM

## 2016-09-18 DIAGNOSIS — Z23 Encounter for immunization: Secondary | ICD-10-CM

## 2016-09-18 DIAGNOSIS — E038 Other specified hypothyroidism: Secondary | ICD-10-CM | POA: Diagnosis not present

## 2016-09-18 DIAGNOSIS — R350 Frequency of micturition: Secondary | ICD-10-CM | POA: Diagnosis not present

## 2016-09-18 LAB — CBC WITH DIFFERENTIAL/PLATELET
BASOS PCT: 1 %
Basophils Absolute: 50 cells/uL (ref 0–200)
EOS PCT: 5 %
Eosinophils Absolute: 250 cells/uL (ref 15–500)
HCT: 38.7 % (ref 35.0–45.0)
HEMOGLOBIN: 12.2 g/dL (ref 12.0–15.0)
LYMPHS ABS: 1450 {cells}/uL (ref 850–3900)
Lymphocytes Relative: 29 %
MCH: 26 pg — AB (ref 27.0–33.0)
MCHC: 31.5 g/dL — ABNORMAL LOW (ref 32.0–36.0)
MCV: 82.5 fL (ref 80.0–100.0)
MONOS PCT: 7 %
MPV: 10.2 fL (ref 7.5–12.5)
Monocytes Absolute: 350 cells/uL (ref 200–950)
NEUTROS ABS: 2900 {cells}/uL (ref 1500–7800)
Neutrophils Relative %: 58 %
PLATELETS: 270 10*3/uL (ref 140–400)
RBC: 4.69 MIL/uL (ref 3.80–5.10)
RDW: 15.5 % — ABNORMAL HIGH (ref 11.0–15.0)
WBC: 5 10*3/uL (ref 3.8–10.8)

## 2016-09-18 LAB — LIPID PANEL
CHOL/HDL RATIO: 2.2 ratio (ref <5.0)
Cholesterol: 153 mg/dL (ref <200)
HDL: 70 mg/dL (ref >50)
LDL CALC: 69 mg/dL (ref <100)
Triglycerides: 70 mg/dL (ref <150)
VLDL: 14 mg/dL (ref <30)

## 2016-09-18 LAB — URINALYSIS, ROUTINE W REFLEX MICROSCOPIC
BILIRUBIN URINE: NEGATIVE
Glucose, UA: NEGATIVE
HGB URINE DIPSTICK: NEGATIVE
KETONES UR: NEGATIVE
Leukocytes, UA: NEGATIVE
Nitrite: NEGATIVE
PH: 6.5 (ref 5.0–8.0)
Protein, ur: NEGATIVE
SPECIFIC GRAVITY, URINE: 1.02 (ref 1.001–1.035)

## 2016-09-18 LAB — TSH: TSH: 0.08 m[IU]/L — AB

## 2016-09-18 LAB — COMPREHENSIVE METABOLIC PANEL
ALBUMIN: 3.9 g/dL (ref 3.6–5.1)
ALT: 9 U/L (ref 6–29)
AST: 19 U/L (ref 10–35)
Alkaline Phosphatase: 119 U/L (ref 33–130)
BILIRUBIN TOTAL: 0.3 mg/dL (ref 0.2–1.2)
BUN: 13 mg/dL (ref 7–25)
CHLORIDE: 99 mmol/L (ref 98–110)
CO2: 25 mmol/L (ref 20–31)
CREATININE: 1.01 mg/dL — AB (ref 0.50–0.99)
Calcium: 10.1 mg/dL (ref 8.6–10.4)
Glucose, Bld: 105 mg/dL — ABNORMAL HIGH (ref 70–99)
Potassium: 4.3 mmol/L (ref 3.5–5.3)
SODIUM: 134 mmol/L — AB (ref 135–146)
TOTAL PROTEIN: 6.9 g/dL (ref 6.1–8.1)

## 2016-09-18 LAB — T3, FREE: T3 FREE: 3.2 pg/mL (ref 2.3–4.2)

## 2016-09-18 LAB — T4, FREE: Free T4: 2 ng/dL — ABNORMAL HIGH (ref 0.8–1.8)

## 2016-09-18 MED ORDER — FUROSEMIDE 40 MG PO TABS
40.0000 mg | ORAL_TABLET | Freq: Every day | ORAL | 3 refills | Status: DC | PRN
Start: 2016-09-18 — End: 2016-12-24

## 2016-09-18 MED ORDER — LIRAGLUTIDE -WEIGHT MANAGEMENT 18 MG/3ML ~~LOC~~ SOPN: 0.6000 mg | PEN_INJECTOR | Freq: Every day | SUBCUTANEOUS | 2 refills | Status: DC

## 2016-09-18 NOTE — Assessment & Plan Note (Signed)
Was also planning clinic. She does not want any further surgical intervention.. Continue the pain medications and muscle relaxers given by orthopedics

## 2016-09-18 NOTE — Assessment & Plan Note (Signed)
Discussed her weight gain and her dietary habits which she admits are poor. This is leading to sodium with water retention. And her increasing weight. We'll check her A1c she is not on any medications currently. With her grossly edema have given her Lasix to use she is to stop her hydrochlorothiazide for the next week while on this. Also elevate the feet.   Discussed this will make her urinate more in the setting of her urge incontinence and overactive bladder

## 2016-09-18 NOTE — Patient Instructions (Addendum)
Take the lasix once a day for 1 week, then as needed - DO NOT TAKE THE HCTZ WITH THIS Change your diet like we discussed, no fried foods, no added salts, no SODA Prevnar 13 shot given Try for your bladder  We will see if Kirke Shaggy is covered for your weight loss  Start saxenda:  Week 1: 0.6mg  injected daily:   Week 2:  1.2 mg injected daily  Week 3:  1.8mg  injected daily  Week 4:  2.4mg  injected daily  Week 5:  3mg  injected daily  Referral to pain clinic- Dr. Merlene Laughter  F/U 3 months

## 2016-09-18 NOTE — Assessment & Plan Note (Signed)
She asked about on contrave  But on anxiety meds and narcotics Discussed Saxenda, and how to use Will see if covered by insurance Would not use phentermine either due to above

## 2016-09-18 NOTE — Assessment & Plan Note (Signed)
We will check thyroid function studies

## 2016-09-18 NOTE — Assessment & Plan Note (Signed)
Controlled no change medication

## 2016-09-18 NOTE — Progress Notes (Signed)
Subjective:    Patient ID: Yesenia [name], female    DOB: 12-13-1950, 66 y.o.   MRN: [id number]  Patient presents for 4 month F/U (is fasting) Issue follow-up chronic medical problems. Her last visit her Synthroid dose was increased to 175 g. TSH was high at 12 discharge her to take first thing in the morning before any other medications. She is due for recheck today.  Diabetes mellitus last A1c was 5.1% in November 2017 this is currently diet controlled she is due for recheck. She also has hyperlipidemia currently on Zocor 10 mg. Previous weight 253lbs in FEB  Hypertension she is taking her medications as prescribed  She's being followed by psychiatry for her depression her last visit noted was in March.  She was recently seen by orthopedics last week secondary to left-sided back pain as well as her shoulder pain. Which she has had surgery on the shoulder. She was given Robaxin and they recommended that she watch for now. She was also given hydrocodone  Given  60 tablets on 6/6. Has some relief.  Request for to pain clinic in [place]  She chronic paindue to DDD, history of back surgery . Was in pain clinic with Dr. Francesco Runner before he left. She has had epidural injections and Physical therapy   Urnary frequency and some incontinence, has urge very quickly , wears pads to help protect.   Feel lile more swelling left foot compare to right, feels like the swelling does not go down   Interested in weight loss medication   Needs a letter to move to 1st floor   Due for Prevnar 13  Review Of Systems:  GEN- denies fatigue, fever, weight loss,weakness, recent illness HEENT- denies eye drainage, change in vision, nasal discharge, CVS- denies chest pain, palpitations RESP- denies SOB, cough, wheeze ABD- denies N/V, change in stools, abd pain GU- denies dysuria, hematuria, dribbling, incontinence MSK- denies joint pain, muscle aches, injury Neuro- denies headache, dizziness, syncope,  seizure activity       Objective:    BP 138/72   Pulse 84   Temp 98.9 F (37.2 C) (Oral)   Resp 14   Ht 5\' 1"  (1.549 m)   Wt 259 lb (117.5 kg)   SpO2 98%   BMI 48.94 kg/m  GEN- NAD, alert and oriented x3 HEENT- PERRL, EOMI, non injected sclera, pink conjunctiva, MMM, oropharynx clear Neck- Supple, no thyromegaly CVS- RRR, systolic murmur heard beast RSB RESP-CTAB ABD-NABS,soft,NT,ND, no CVA tenderness MSK- TTP lumnar spine, left paraspinals, TTP buttocks, +SLR left side, antalgic gait  EXT- mild ptting edema to shins  Pulses- Radial, DP- 2+        Assessment & Plan:      Problem List Items Addressed This Visit    Previous back surgery   Diabetic neuropathy ([place])   Fatty liver   Hyperlipemia   Relevant Medications   furosemide (LASIX) 40 MG tablet   Other Relevant Orders   Lipid panel   Type II diabetes mellitus with neurological manifestations ([place]) - Primary    Discussed her weight gain and her dietary habits which she admits are poor. This is leading to sodium with water retention. And her increasing weight. We'll check her A1c she is not on any medications currently. With her grossly edema have given her Lasix to use she is to stop her hydrochlorothiazide for the next week while on this. Also elevate the feet.   Discussed this will make her urinate more in  the setting of her urge incontinence and overactive bladder       Relevant Orders   CBC with Differential/Platelet   Comprehensive metabolic panel   Hemoglobin A1c   HM DIABETES FOOT EXAM (Completed)   Morbid obesity ([place])    She asked about on contrave  But on anxiety meds and narcotics Discussed Saxenda, and how to use Will see if covered by insurance Would not use phentermine either due to above      Relevant Medications   Liraglutide -Weight Management ([place]) 18 MG/3ML SOPN   Hypothyroidism    We will check thyroid function studies      Relevant Orders   TSH   T3, free   T4, free    Essential hypertension, benign    Controlled no change medication      Relevant Medications   furosemide (LASIX) 40 MG tablet   DDD (degenerative disc disease), lumbar    Was also planning clinic. She does not want any further surgical intervention.. Continue the pain medications and muscle relaxers given by orthopedics       Other Visit Diagnoses    Urge incontinence       Trial of Myrebetriq 50mg  daily   Relevant Orders   Urinalysis, Routine w reflex microscopic (Completed)   Need for vaccination against Streptococcus pneumoniae          Note: This dictation was prepared with Dragon dictation along with smaller phrase technology. Any transcriptional errors that result from this process are unintentional.

## 2016-09-19 LAB — HEMOGLOBIN A1C
HEMOGLOBIN A1C: 6.1 % — AB (ref <5.7)
Mean Plasma Glucose: 128 mg/dL

## 2016-09-24 ENCOUNTER — Encounter: Payer: Self-pay | Admitting: *Deleted

## 2016-09-24 ENCOUNTER — Other Ambulatory Visit: Payer: Self-pay | Admitting: *Deleted

## 2016-09-24 MED ORDER — LEVOTHYROXINE SODIUM 150 MCG PO TABS: 150.0000 ug | ORAL_TABLET | Freq: Every day | ORAL | 3 refills | Status: DC

## 2016-10-05 ENCOUNTER — Telehealth: Payer: Self-pay | Admitting: *Deleted

## 2016-10-05 MED ORDER — METHOCARBAMOL 500 MG PO TABS: 500.0000 mg | ORAL_TABLET | Freq: Three times a day (TID) | ORAL | 0 refills | Status: DC | PRN

## 2016-10-05 MED ORDER — HYDROCODONE-ACETAMINOPHEN 5-325 MG PO TABS: 1.0000 | ORAL_TABLET | Freq: Three times a day (TID) | ORAL | 0 refills | Status: DC | PRN

## 2016-10-05 NOTE — Telephone Encounter (Signed)
Okay to refill both quantity #45 tablets on the Norco Keep taking potassium and zocor

## 2016-10-05 NOTE — Telephone Encounter (Signed)
Prescription printed and patient made aware to come to office to pick up on 10/06/2016. 

## 2016-10-05 NOTE — Telephone Encounter (Signed)
Received call from patient.   Reports that she is no longer being seen by Dr. Marlou Sa, Orthopedics for pain. States that he was prescribing her Norco and Robaxin. Last prescriptions given on 09/09/2016. Requested MD to fill.   Also inquired as to if she requires K+ supplements and Zocor since her blood levels are now WNL. Advised that levels are most likely WNL since she is taking her medications.   MD please advise.

## 2016-10-09 ENCOUNTER — Telehealth: Payer: Self-pay | Admitting: *Deleted

## 2016-10-09 MED ORDER — METHOCARBAMOL 500 MG PO TABS: 500.0000 mg | ORAL_TABLET | Freq: Three times a day (TID) | ORAL | 0 refills | Status: DC | PRN

## 2016-10-09 NOTE — Telephone Encounter (Signed)
Received fax requesting refill on Robaxin.   Ok to refill? 

## 2016-10-09 NOTE — Telephone Encounter (Signed)
Prescription sent to pharmacy.

## 2016-10-09 NOTE — Telephone Encounter (Signed)
OKAY TO REFILL?

## 2016-10-15 ENCOUNTER — Telehealth: Payer: Self-pay | Admitting: *Deleted

## 2016-10-15 MED ORDER — LANCET DEVICES MISC: 1 refills | Status: DC

## 2016-10-15 MED ORDER — LANCETS MISC: 1 refills | Status: DC

## 2016-10-15 MED ORDER — BLOOD GLUCOSE TEST VI STRP: ORAL_STRIP | 1 refills | Status: DC

## 2016-10-15 MED ORDER — BLOOD GLUCOSE SYSTEM PAK KIT: PACK | 1 refills | Status: DC

## 2016-10-15 MED ORDER — MIRABEGRON ER 50 MG PO TB24: 50.0000 mg | ORAL_TABLET | Freq: Every day | ORAL | 3 refills | Status: DC

## 2016-10-15 NOTE — Telephone Encounter (Signed)
Received call from patient.    States that she is unable to afford Saxenda. Ok to change to Victoza?  Patient also requested prescription for Myrbetriq 50mg . Patient states that samples are effective. Requires refills for DM supplies to [facility] as well. Prescription sent to pharmacy.

## 2016-10-19 MED ORDER — LIRAGLUTIDE 18 MG/3ML ~~LOC~~ SOPN: PEN_INJECTOR | SUBCUTANEOUS | 11 refills | Status: DC

## 2016-10-19 NOTE — Telephone Encounter (Signed)
Okay to change to Victoza- start at 0.6mg  and titrate up to  1.8mg  daily  Okay to send Myrbetriq 50mg  daily

## 2016-10-19 NOTE — Telephone Encounter (Signed)
Prescription sent to pharmacy.

## 2016-10-20 DIAGNOSIS — M5441 Lumbago with sciatica, right side: Secondary | ICD-10-CM | POA: Diagnosis not present

## 2016-10-20 DIAGNOSIS — I10 Essential (primary) hypertension: Secondary | ICD-10-CM | POA: Diagnosis not present

## 2016-10-28 DIAGNOSIS — M545 Low back pain: Secondary | ICD-10-CM | POA: Diagnosis not present

## 2016-10-28 DIAGNOSIS — Z79891 Long term (current) use of opiate analgesic: Secondary | ICD-10-CM | POA: Diagnosis not present

## 2016-10-28 DIAGNOSIS — M2500 Hemarthrosis, unspecified joint: Secondary | ICD-10-CM | POA: Diagnosis not present

## 2016-10-28 DIAGNOSIS — M797 Fibromyalgia: Secondary | ICD-10-CM | POA: Diagnosis not present

## 2016-10-28 DIAGNOSIS — M25519 Pain in unspecified shoulder: Secondary | ICD-10-CM | POA: Diagnosis not present

## 2016-10-28 DIAGNOSIS — M461 Sacroiliitis, not elsewhere classified: Secondary | ICD-10-CM | POA: Diagnosis not present

## 2016-10-28 DIAGNOSIS — Z79899 Other long term (current) drug therapy: Secondary | ICD-10-CM | POA: Diagnosis not present

## 2016-11-07 ENCOUNTER — Other Ambulatory Visit: Payer: Self-pay | Admitting: Family Medicine

## 2016-11-09 NOTE — Telephone Encounter (Signed)
Okay to refill, if they do not have in stock  Change to Zanaflex 4mg  q 8 hrs prn  #30

## 2016-11-09 NOTE — Telephone Encounter (Signed)
Ok to refill 

## 2016-11-09 NOTE — Telephone Encounter (Signed)
Prescription sent to pharmacy.

## 2016-11-12 ENCOUNTER — Other Ambulatory Visit: Payer: Self-pay | Admitting: Family Medicine

## 2016-11-12 ENCOUNTER — Other Ambulatory Visit: Payer: Self-pay | Admitting: Neurosurgery

## 2016-11-12 DIAGNOSIS — M5441 Lumbago with sciatica, right side: Secondary | ICD-10-CM

## 2016-11-20 ENCOUNTER — Other Ambulatory Visit: Payer: Self-pay | Admitting: Neurology

## 2016-11-20 DIAGNOSIS — M533 Sacrococcygeal disorders, not elsewhere classified: Secondary | ICD-10-CM

## 2016-11-26 ENCOUNTER — Other Ambulatory Visit: Payer: Self-pay

## 2016-11-26 DIAGNOSIS — M545 Low back pain: Secondary | ICD-10-CM | POA: Diagnosis not present

## 2016-11-26 DIAGNOSIS — M25519 Pain in unspecified shoulder: Secondary | ICD-10-CM | POA: Diagnosis not present

## 2016-11-26 DIAGNOSIS — M461 Sacroiliitis, not elsewhere classified: Secondary | ICD-10-CM | POA: Diagnosis not present

## 2016-11-26 DIAGNOSIS — Z79891 Long term (current) use of opiate analgesic: Secondary | ICD-10-CM | POA: Diagnosis not present

## 2016-12-08 ENCOUNTER — Other Ambulatory Visit: Payer: Self-pay

## 2016-12-10 ENCOUNTER — Other Ambulatory Visit: Payer: Self-pay

## 2016-12-18 ENCOUNTER — Other Ambulatory Visit: Payer: Self-pay | Admitting: Family Medicine

## 2016-12-21 ENCOUNTER — Emergency Department (HOSPITAL_COMMUNITY): Payer: Medicare Other

## 2016-12-21 ENCOUNTER — Observation Stay (HOSPITAL_COMMUNITY)
Admission: EM | Admit: 2016-12-21 | Discharge: 2016-12-24 | Disposition: A | Discharge status: Home / Self Care | Payer: Medicare Other | Attending: Internal Medicine | Admitting: Internal Medicine

## 2016-12-21 ENCOUNTER — Ambulatory Visit: Payer: Medicare Other | Admitting: Family Medicine

## 2016-12-21 ENCOUNTER — Encounter (HOSPITAL_COMMUNITY): Payer: Self-pay | Admitting: *Deleted

## 2016-12-21 DIAGNOSIS — G4730 Sleep apnea, unspecified: Secondary | ICD-10-CM | POA: Diagnosis present

## 2016-12-21 DIAGNOSIS — M797 Fibromyalgia: Secondary | ICD-10-CM | POA: Diagnosis not present

## 2016-12-21 DIAGNOSIS — K219 Gastro-esophageal reflux disease without esophagitis: Secondary | ICD-10-CM | POA: Diagnosis present

## 2016-12-21 DIAGNOSIS — N179 Acute kidney failure, unspecified: Secondary | ICD-10-CM | POA: Diagnosis present

## 2016-12-21 DIAGNOSIS — I129 Hypertensive chronic kidney disease with stage 1 through stage 4 chronic kidney disease, or unspecified chronic kidney disease: Secondary | ICD-10-CM | POA: Diagnosis not present

## 2016-12-21 DIAGNOSIS — E876 Hypokalemia: Secondary | ICD-10-CM | POA: Diagnosis present

## 2016-12-21 DIAGNOSIS — R079 Chest pain, unspecified: Secondary | ICD-10-CM | POA: Diagnosis not present

## 2016-12-21 DIAGNOSIS — I10 Essential (primary) hypertension: Secondary | ICD-10-CM | POA: Diagnosis present

## 2016-12-21 DIAGNOSIS — F419 Anxiety disorder, unspecified: Secondary | ICD-10-CM | POA: Insufficient documentation

## 2016-12-21 DIAGNOSIS — Z79899 Other long term (current) drug therapy: Secondary | ICD-10-CM | POA: Diagnosis not present

## 2016-12-21 DIAGNOSIS — R0789 Other chest pain: Secondary | ICD-10-CM | POA: Diagnosis not present

## 2016-12-21 DIAGNOSIS — Z23 Encounter for immunization: Secondary | ICD-10-CM | POA: Insufficient documentation

## 2016-12-21 DIAGNOSIS — F32A Depression, unspecified: Secondary | ICD-10-CM | POA: Diagnosis present

## 2016-12-21 DIAGNOSIS — E1149 Type 2 diabetes mellitus with other diabetic neurological complication: Secondary | ICD-10-CM | POA: Diagnosis not present

## 2016-12-21 DIAGNOSIS — E039 Hypothyroidism, unspecified: Secondary | ICD-10-CM | POA: Diagnosis present

## 2016-12-21 DIAGNOSIS — Z794 Long term (current) use of insulin: Secondary | ICD-10-CM | POA: Diagnosis not present

## 2016-12-21 DIAGNOSIS — N182 Chronic kidney disease, stage 2 (mild): Secondary | ICD-10-CM | POA: Diagnosis not present

## 2016-12-21 DIAGNOSIS — R9439 Abnormal result of other cardiovascular function study: Secondary | ICD-10-CM | POA: Diagnosis not present

## 2016-12-21 DIAGNOSIS — R072 Precordial pain: Secondary | ICD-10-CM | POA: Diagnosis not present

## 2016-12-21 DIAGNOSIS — R200 Anesthesia of skin: Secondary | ICD-10-CM | POA: Diagnosis not present

## 2016-12-21 HISTORY — DX: Essential (primary) hypertension: I10

## 2016-12-21 HISTORY — DX: Type 2 diabetes mellitus without complications: E11.9

## 2016-12-21 LAB — BASIC METABOLIC PANEL
Anion gap: 13 (ref 5–15)
BUN: 12 mg/dL (ref 6–20)
CALCIUM: 9.5 mg/dL (ref 8.9–10.3)
CO2: 29 mmol/L (ref 22–32)
Chloride: 93 mmol/L — ABNORMAL LOW (ref 101–111)
Creatinine, Ser: 1.43 mg/dL — ABNORMAL HIGH (ref 0.44–1.00)
GFR calc Af Amer: 43 mL/min — ABNORMAL LOW (ref >60)
GFR, EST NON AFRICAN AMERICAN: 38 mL/min — AB (ref >60)
Glucose, Bld: 103 mg/dL — ABNORMAL HIGH (ref 65–99)
Potassium: 2.8 mmol/L — ABNORMAL LOW (ref 3.5–5.1)
Sodium: 135 mmol/L (ref 135–145)

## 2016-12-21 LAB — CBC
HCT: 44.9 % (ref 36.0–46.0)
HEMOGLOBIN: 14.2 g/dL (ref 12.0–15.0)
MCH: 26.9 pg (ref 26.0–34.0)
MCHC: 31.6 g/dL (ref 30.0–36.0)
MCV: 85.2 fL (ref 78.0–100.0)
Platelets: 349 10*3/uL (ref 150–400)
RBC: 5.27 MIL/uL — ABNORMAL HIGH (ref 3.87–5.11)
RDW: 15 % (ref 11.5–15.5)
WBC: 7.2 10*3/uL (ref 4.0–10.5)

## 2016-12-21 LAB — I-STAT TROPONIN, ED: TROPONIN I, POC: 0 ng/mL (ref 0.00–0.08)

## 2016-12-21 MED ORDER — BUSPIRONE HCL 5 MG PO TABS
10.0000 mg | ORAL_TABLET | Freq: Three times a day (TID) | ORAL | Status: DC
  Administered 2016-12-22 – 2016-12-24 (×8): 10 mg via ORAL
  Filled 2016-12-21: qty 2
  Filled 2016-12-21: qty 1
  Filled 2016-12-21: qty 2
  Filled 2016-12-21: qty 1
  Filled 2016-12-21: qty 2
  Filled 2016-12-21: qty 1
  Filled 2016-12-21: qty 2
  Filled 2016-12-21: qty 1
  Filled 2016-12-21: qty 2
  Filled 2016-12-21 (×3): qty 1
  Filled 2016-12-21: qty 2
  Filled 2016-12-21: qty 1
  Filled 2016-12-21 (×2): qty 2
  Filled 2016-12-21: qty 1
  Filled 2016-12-21: qty 2
  Filled 2016-12-21 (×2): qty 1

## 2016-12-21 MED ORDER — LINACLOTIDE 72 MCG PO CAPS
72.0000 ug | ORAL_CAPSULE | Freq: Every day | ORAL | Status: DC
  Administered 2016-12-22 – 2016-12-23 (×2): 72 ug via ORAL
  Filled 2016-12-21 (×4): qty 1

## 2016-12-21 MED ORDER — NITROGLYCERIN 2 % TD OINT
1.0000 [in_us] | TOPICAL_OINTMENT | Freq: Once | TRANSDERMAL | Status: AC
  Administered 2016-12-21: 1 [in_us] via TOPICAL
  Filled 2016-12-21: qty 1

## 2016-12-21 MED ORDER — HYDRALAZINE HCL 20 MG/ML IJ SOLN: 10.0000 mg | Freq: Three times a day (TID) | INTRAMUSCULAR | Status: DC | PRN

## 2016-12-21 MED ORDER — ACETAMINOPHEN 325 MG PO TABS
650.0000 mg | ORAL_TABLET | ORAL | Status: DC | PRN
Start: 2016-12-21 — End: 2016-12-24

## 2016-12-21 MED ORDER — POTASSIUM CHLORIDE CRYS ER 20 MEQ PO TBCR
40.0000 meq | EXTENDED_RELEASE_TABLET | Freq: Once | ORAL | Status: AC
  Administered 2016-12-21: 40 meq via ORAL
  Filled 2016-12-21: qty 2

## 2016-12-21 MED ORDER — PANTOPRAZOLE SODIUM 40 MG PO TBEC
40.0000 mg | DELAYED_RELEASE_TABLET | Freq: Every day | ORAL | Status: DC
  Administered 2016-12-22 – 2016-12-23 (×2): 40 mg via ORAL
  Filled 2016-12-21 (×2): qty 1

## 2016-12-21 MED ORDER — PREGABALIN 50 MG PO CAPS
100.0000 mg | ORAL_CAPSULE | Freq: Three times a day (TID) | ORAL | Status: DC
Start: 2016-12-22 — End: 2016-12-24
  Administered 2016-12-22 – 2016-12-24 (×8): 100 mg via ORAL
  Filled 2016-12-21 (×8): qty 2

## 2016-12-21 MED ORDER — MIRABEGRON ER 25 MG PO TB24
50.0000 mg | ORAL_TABLET | Freq: Every day | ORAL | Status: DC
  Administered 2016-12-22 – 2016-12-23 (×2): 50 mg via ORAL
  Filled 2016-12-21 (×2): qty 1
  Filled 2016-12-21 (×2): qty 2

## 2016-12-21 MED ORDER — SIMVASTATIN 10 MG PO TABS
10.0000 mg | ORAL_TABLET | Freq: Every day | ORAL | Status: DC
  Administered 2016-12-22 – 2016-12-23 (×3): 10 mg via ORAL
  Filled 2016-12-21 (×3): qty 1

## 2016-12-21 MED ORDER — DULOXETINE HCL 60 MG PO CPEP
60.0000 mg | ORAL_CAPSULE | Freq: Every day | ORAL | Status: DC
  Administered 2016-12-22 – 2016-12-23 (×2): 60 mg via ORAL
  Filled 2016-12-21 (×2): qty 1

## 2016-12-21 MED ORDER — HEPARIN SODIUM (PORCINE) 5000 UNIT/ML IJ SOLN
5000.0000 [IU] | Freq: Three times a day (TID) | INTRAMUSCULAR | Status: DC
  Administered 2016-12-22 – 2016-12-24 (×7): 5000 [IU] via SUBCUTANEOUS
  Filled 2016-12-21 (×8): qty 1

## 2016-12-21 MED ORDER — MORPHINE SULFATE (PF) 4 MG/ML IV SOLN
4.0000 mg | INTRAVENOUS | Status: DC | PRN
  Administered 2016-12-22 – 2016-12-24 (×3): 4 mg via INTRAVENOUS
  Filled 2016-12-21 (×3): qty 1

## 2016-12-21 MED ORDER — LEVOTHYROXINE SODIUM 75 MCG PO TABS
150.0000 ug | ORAL_TABLET | Freq: Every day | ORAL | Status: DC
  Administered 2016-12-22 – 2016-12-23 (×2): 150 ug via ORAL
  Filled 2016-12-21 (×2): qty 2

## 2016-12-21 MED ORDER — POTASSIUM CHLORIDE 10 MEQ/100ML IV SOLN
10.0000 meq | Freq: Once | INTRAVENOUS | Status: AC
  Administered 2016-12-21: 10 meq via INTRAVENOUS
  Filled 2016-12-21: qty 100

## 2016-12-21 MED ORDER — TRAZODONE HCL 50 MG PO TABS
150.0000 mg | ORAL_TABLET | Freq: Every day | ORAL | Status: DC
  Administered 2016-12-22 – 2016-12-23 (×3): 150 mg via ORAL
  Filled 2016-12-21 (×3): qty 3

## 2016-12-21 MED ORDER — NITROGLYCERIN 2 % TD OINT
1.0000 [in_us] | TOPICAL_OINTMENT | Freq: Once | TRANSDERMAL | Status: AC
  Administered 2016-12-22: 1 [in_us] via TOPICAL
  Filled 2016-12-21: qty 1

## 2016-12-21 MED ORDER — ONDANSETRON HCL 4 MG/2ML IJ SOLN: 4.0000 mg | Freq: Four times a day (QID) | INTRAMUSCULAR | Status: DC | PRN

## 2016-12-21 MED ORDER — OXYCODONE-ACETAMINOPHEN 5-325 MG PO TABS
1.0000 | ORAL_TABLET | Freq: Two times a day (BID) | ORAL | Status: DC
  Administered 2016-12-22 – 2016-12-23 (×5): 1 via ORAL
  Filled 2016-12-21 (×5): qty 1

## 2016-12-21 MED ORDER — FAMOTIDINE 20 MG PO TABS
10.0000 mg | ORAL_TABLET | Freq: Every day | ORAL | Status: DC
  Administered 2016-12-22 – 2016-12-24 (×3): 10 mg via ORAL
  Filled 2016-12-21 (×3): qty 1

## 2016-12-21 NOTE — ED Triage Notes (Signed)
Pt states she was lying in bed and started having chest pain that radiated to right shoulder, back, neck and jaw; pt was given 324mg  of ASA en route by ems

## 2016-12-22 ENCOUNTER — Encounter (HOSPITAL_COMMUNITY): Payer: Self-pay | Admitting: Cardiology

## 2016-12-22 ENCOUNTER — Observation Stay (HOSPITAL_BASED_OUTPATIENT_CLINIC_OR_DEPARTMENT_OTHER): Payer: Medicare Other

## 2016-12-22 ENCOUNTER — Observation Stay (HOSPITAL_COMMUNITY): Payer: Medicare Other

## 2016-12-22 DIAGNOSIS — E782 Mixed hyperlipidemia: Secondary | ICD-10-CM | POA: Diagnosis not present

## 2016-12-22 DIAGNOSIS — N179 Acute kidney failure, unspecified: Secondary | ICD-10-CM | POA: Diagnosis not present

## 2016-12-22 DIAGNOSIS — N182 Chronic kidney disease, stage 2 (mild): Secondary | ICD-10-CM

## 2016-12-22 DIAGNOSIS — I10 Essential (primary) hypertension: Secondary | ICD-10-CM

## 2016-12-22 DIAGNOSIS — E038 Other specified hypothyroidism: Secondary | ICD-10-CM | POA: Diagnosis not present

## 2016-12-22 DIAGNOSIS — R072 Precordial pain: Secondary | ICD-10-CM | POA: Diagnosis not present

## 2016-12-22 DIAGNOSIS — N281 Cyst of kidney, acquired: Secondary | ICD-10-CM | POA: Diagnosis not present

## 2016-12-22 DIAGNOSIS — F419 Anxiety disorder, unspecified: Secondary | ICD-10-CM

## 2016-12-22 DIAGNOSIS — Z86711 Personal history of pulmonary embolism: Secondary | ICD-10-CM

## 2016-12-22 DIAGNOSIS — R079 Chest pain, unspecified: Secondary | ICD-10-CM | POA: Diagnosis not present

## 2016-12-22 LAB — URINALYSIS, ROUTINE W REFLEX MICROSCOPIC
Bilirubin Urine: NEGATIVE
Cellular Cast, UA: 4
Glucose, UA: NEGATIVE mg/dL
Ketones, ur: NEGATIVE mg/dL
NITRITE: NEGATIVE
Protein, ur: 30 mg/dL — AB
SPECIFIC GRAVITY, URINE: 1.017 (ref 1.005–1.030)
pH: 5 (ref 5.0–8.0)

## 2016-12-22 LAB — GLUCOSE, CAPILLARY
GLUCOSE-CAPILLARY: 148 mg/dL — AB (ref 65–99)
GLUCOSE-CAPILLARY: 121 mg/dL — AB (ref 65–99)
GLUCOSE-CAPILLARY: 124 mg/dL — AB (ref 65–99)
Glucose-Capillary: 94 mg/dL (ref 65–99)

## 2016-12-22 LAB — D-DIMER, QUANTITATIVE (NOT AT ARMC)

## 2016-12-22 LAB — MAGNESIUM: Magnesium: 2 mg/dL (ref 1.7–2.4)

## 2016-12-22 LAB — ECHOCARDIOGRAM COMPLETE
Height: 61 in
Weight: 3777.8 oz

## 2016-12-22 LAB — PHOSPHORUS: PHOSPHORUS: 3.7 mg/dL (ref 2.5–4.6)

## 2016-12-22 LAB — TROPONIN I
Troponin I: <0.03 ng/mL (ref <0.03)
Troponin I: <0.03 ng/mL (ref <0.03)

## 2016-12-22 LAB — SODIUM, URINE, RANDOM: SODIUM UR: 29 mmol/L

## 2016-12-22 MED ORDER — REGADENOSON 0.4 MG/5ML IV SOLN
0.4000 mg | Freq: Once | INTRAVENOUS | Status: AC
  Administered 2016-12-23: 0.4 mg via INTRAVENOUS
  Filled 2016-12-22: qty 5

## 2016-12-22 MED ORDER — ASPIRIN 325 MG PO TABS
325.0000 mg | ORAL_TABLET | Freq: Every day | ORAL | Status: DC
  Administered 2016-12-22 – 2016-12-24 (×3): 325 mg via ORAL
  Filled 2016-12-22 (×3): qty 1

## 2016-12-22 MED ORDER — REGADENOSON 0.4 MG/5ML IV SOLN
0.4000 mg | Freq: Once | INTRAVENOUS | Status: DC
  Filled 2016-12-22: qty 5

## 2016-12-22 MED ORDER — INSULIN ASPART 100 UNIT/ML ~~LOC~~ SOLN
0.0000 [IU] | Freq: Three times a day (TID) | SUBCUTANEOUS | Status: DC
  Administered 2016-12-22 (×2): 3 [IU] via SUBCUTANEOUS
  Administered 2016-12-24: 4 [IU] via SUBCUTANEOUS

## 2016-12-22 MED ORDER — METHOCARBAMOL 500 MG PO TABS
250.0000 mg | ORAL_TABLET | Freq: Three times a day (TID) | ORAL | Status: DC | PRN
Start: 2016-12-22 — End: 2016-12-24

## 2016-12-22 MED ORDER — POTASSIUM CHLORIDE CRYS ER 20 MEQ PO TBCR
40.0000 meq | EXTENDED_RELEASE_TABLET | Freq: Once | ORAL | Status: AC
  Administered 2016-12-22: 40 meq via ORAL
  Filled 2016-12-22: qty 2

## 2016-12-22 MED ORDER — INFLUENZA VAC SPLIT HIGH-DOSE 0.5 ML IM SUSY
0.5000 mL | PREFILLED_SYRINGE | INTRAMUSCULAR | Status: AC
  Administered 2016-12-23: 0.5 mL via INTRAMUSCULAR
  Filled 2016-12-22: qty 0.5

## 2016-12-22 MED ORDER — IOPAMIDOL (ISOVUE-370) INJECTION 76%
80.0000 mL | Freq: Once | INTRAVENOUS | Status: AC | PRN
  Administered 2016-12-22: 80 mL via INTRAVENOUS

## 2016-12-22 MED ORDER — SODIUM CHLORIDE 0.9 % IV SOLN
INTRAVENOUS | Status: AC
  Administered 2016-12-22 (×2): via INTRAVENOUS

## 2016-12-22 MED ORDER — DEXTROSE 5 % IV SOLN
1.0000 g | INTRAVENOUS | Status: DC
  Administered 2016-12-22 – 2016-12-24 (×3): 1 g via INTRAVENOUS
  Filled 2016-12-22 (×4): qty 10

## 2016-12-22 NOTE — Care Management Note (Signed)
Case Management Note  Patient Details  Name: Yesenia [name] MRN: [id number] Date of Birth: 1950-07-28  Subjective/Objective:                  Admitted with CP, cardiology consult pending. No CM consult, pt seen to give MOON form. Pt lives at home, alone normally, but staying with son while apartment is being re-modeled. Pt ind with ADL's, she has PCP, transportation and insurance with drug coverage. He is wearing oxygen acutely. She has no DME pta. She communicates no needs or concerns about DC.   Action/Plan: No CM needs anticipated, will need to wean from oxygen prior to DC. CM will follow to DC.   Expected Discharge Date:    12/23/2016              Expected Discharge Plan:  Home/Self Care  In-House Referral:  NA  Discharge planning Services  CM Consult  Status of Service:  In process, will continue to follow  Sherald Barge, RN 12/22/2016, 3:06 PM

## 2016-12-22 NOTE — Progress Notes (Addendum)
Patient seen and examined  Please refer to H&P done by Dr. Aggie Moats on 9/18  Patient had one episode of chest pain this morning, substernal chest pain with radiation into the shoulder and jaw bilaterally, does troponin, enzymes, d-dimer, CT angiogram all negative. Waiting results of 2-D echo.    At this point the patient's chest pain is atypical  Patient is concerned and does not want to go home  We will request cardiology to see the patient can have a stress test in the morning also had some  AKI which is improving, continue IV hydration and recheck renal function tomorrow
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
## 2016-12-22 NOTE — Progress Notes (Signed)
  Echocardiogram 2D Echocardiogram has been performed.  Darlina Sicilian M 12/22/2016, 1:19 PM

## 2016-12-22 NOTE — Care Management Obs Status (Signed)
[place] NOTIFICATION   Patient Details  Name: Yesenia [name] MRN: [id number] Date of Birth: 06-30-1950   Medicare Observation Status Notification Given:  Yes    Sherald Barge, RN 12/22/2016, 12:34 PM
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
## 2016-12-23 ENCOUNTER — Encounter (HOSPITAL_COMMUNITY): Payer: Self-pay

## 2016-12-23 ENCOUNTER — Observation Stay (HOSPITAL_BASED_OUTPATIENT_CLINIC_OR_DEPARTMENT_OTHER): Payer: Medicare Other

## 2016-12-23 DIAGNOSIS — I200 Unstable angina: Secondary | ICD-10-CM

## 2016-12-23 DIAGNOSIS — E782 Mixed hyperlipidemia: Secondary | ICD-10-CM | POA: Diagnosis not present

## 2016-12-23 DIAGNOSIS — Z86711 Personal history of pulmonary embolism: Secondary | ICD-10-CM | POA: Diagnosis not present

## 2016-12-23 DIAGNOSIS — M797 Fibromyalgia: Secondary | ICD-10-CM | POA: Diagnosis not present

## 2016-12-23 DIAGNOSIS — E1149 Type 2 diabetes mellitus with other diabetic neurological complication: Secondary | ICD-10-CM | POA: Diagnosis not present

## 2016-12-23 DIAGNOSIS — E876 Hypokalemia: Secondary | ICD-10-CM | POA: Diagnosis not present

## 2016-12-23 DIAGNOSIS — N179 Acute kidney failure, unspecified: Secondary | ICD-10-CM | POA: Diagnosis not present

## 2016-12-23 DIAGNOSIS — E038 Other specified hypothyroidism: Secondary | ICD-10-CM

## 2016-12-23 DIAGNOSIS — R079 Chest pain, unspecified: Secondary | ICD-10-CM | POA: Diagnosis not present

## 2016-12-23 DIAGNOSIS — R072 Precordial pain: Secondary | ICD-10-CM

## 2016-12-23 DIAGNOSIS — I10 Essential (primary) hypertension: Secondary | ICD-10-CM | POA: Diagnosis not present

## 2016-12-23 LAB — GLUCOSE, CAPILLARY
GLUCOSE-CAPILLARY: 103 mg/dL — AB (ref 65–99)
Glucose-Capillary: 103 mg/dL — ABNORMAL HIGH (ref 65–99)
Glucose-Capillary: 137 mg/dL — ABNORMAL HIGH (ref 65–99)

## 2016-12-23 LAB — CBC
HCT: 38 % (ref 36.0–46.0)
HEMOGLOBIN: 11.8 g/dL — AB (ref 12.0–15.0)
MCH: 27.3 pg (ref 26.0–34.0)
MCHC: 31.1 g/dL (ref 30.0–36.0)
MCV: 87.8 fL (ref 78.0–100.0)
PLATELETS: 275 10*3/uL (ref 150–400)
RBC: 4.33 MIL/uL (ref 3.87–5.11)
RDW: 15 % (ref 11.5–15.5)
WBC: 5.5 10*3/uL (ref 4.0–10.5)

## 2016-12-23 LAB — COMPREHENSIVE METABOLIC PANEL
ALBUMIN: 3 g/dL — AB (ref 3.5–5.0)
ALK PHOS: 72 U/L (ref 38–126)
ALT: 9 U/L — ABNORMAL LOW (ref 14–54)
ANION GAP: 6 (ref 5–15)
AST: 19 U/L (ref 15–41)
BILIRUBIN TOTAL: 0.4 mg/dL (ref 0.3–1.2)
BUN: 12 mg/dL (ref 6–20)
CALCIUM: 9 mg/dL (ref 8.9–10.3)
CO2: 29 mmol/L (ref 22–32)
Chloride: 100 mmol/L — ABNORMAL LOW (ref 101–111)
Creatinine, Ser: 1.08 mg/dL — ABNORMAL HIGH (ref 0.44–1.00)
GFR calc non Af Amer: 52 mL/min — ABNORMAL LOW (ref >60)
GLUCOSE: 107 mg/dL — AB (ref 65–99)
Potassium: 3.3 mmol/L — ABNORMAL LOW (ref 3.5–5.1)
Sodium: 135 mmol/L (ref 135–145)
TOTAL PROTEIN: 5.9 g/dL — AB (ref 6.5–8.1)

## 2016-12-23 LAB — HIV ANTIBODY (ROUTINE TESTING W REFLEX): HIV SCREEN 4TH GENERATION: NONREACTIVE

## 2016-12-23 LAB — NM MYOCAR MULTI W/SPECT W/WALL MOTION / EF
CHL CUP NUCLEAR SDS: 0
CHL CUP NUCLEAR SRS: 0
CSEPPHR: 84 {beats}/min
LV dias vol: 71 mL (ref 46–106)
LV sys vol: 27 mL
RATE: 0.32
Rest HR: 70 {beats}/min
SSS: 0
TID: 1.42

## 2016-12-23 LAB — UREA NITROGEN, URINE: UREA NITROGEN UR: 415 mg/dL

## 2016-12-23 MED ORDER — DIPHENHYDRAMINE HCL 25 MG PO CAPS: 50.0000 mg | ORAL_CAPSULE | Freq: Once | ORAL | Status: AC

## 2016-12-23 MED ORDER — NITROGLYCERIN 2 % TD OINT
0.5000 [in_us] | TOPICAL_OINTMENT | Freq: Four times a day (QID) | TRANSDERMAL | Status: DC
  Administered 2016-12-24 (×2): 0.5 [in_us] via TOPICAL
  Filled 2016-12-23: qty 30

## 2016-12-23 MED ORDER — SODIUM CHLORIDE 0.9% FLUSH
INTRAVENOUS | Status: AC
  Administered 2016-12-23: 10 mL via INTRAVENOUS
  Filled 2016-12-23: qty 10

## 2016-12-23 MED ORDER — REGADENOSON 0.4 MG/5ML IV SOLN
INTRAVENOUS | Status: AC
  Administered 2016-12-23: 0.4 mg via INTRAVENOUS
  Filled 2016-12-23: qty 5

## 2016-12-23 MED ORDER — DIPHENHYDRAMINE HCL 50 MG/ML IJ SOLN
50.0000 mg | Freq: Once | INTRAMUSCULAR | Status: AC
  Administered 2016-12-24: 50 mg via INTRAVENOUS
  Filled 2016-12-23: qty 1

## 2016-12-23 MED ORDER — SODIUM CHLORIDE 0.9% FLUSH: 3.0000 mL | INTRAVENOUS | Status: DC | PRN

## 2016-12-23 MED ORDER — SODIUM CHLORIDE 0.9 % WEIGHT BASED INFUSION
3.0000 mL/kg/h | INTRAVENOUS | Status: DC
  Administered 2016-12-24: 3 mL/kg/h via INTRAVENOUS

## 2016-12-23 MED ORDER — SODIUM CHLORIDE 0.9 % IV SOLN: 250.0000 mL | INTRAVENOUS | Status: DC | PRN

## 2016-12-23 MED ORDER — TECHNETIUM TC 99M TETROFOSMIN IV KIT
10.0000 | PACK | Freq: Once | INTRAVENOUS | Status: AC | PRN
  Administered 2016-12-23: 10.8 via INTRAVENOUS

## 2016-12-23 MED ORDER — SODIUM CHLORIDE 0.9% FLUSH
3.0000 mL | Freq: Two times a day (BID) | INTRAVENOUS | Status: DC
  Administered 2016-12-23 (×2): 3 mL via INTRAVENOUS

## 2016-12-23 MED ORDER — NITROGLYCERIN 2 % TD OINT
0.5000 [in_us] | TOPICAL_OINTMENT | Freq: Four times a day (QID) | TRANSDERMAL | Status: DC
  Administered 2016-12-23: 0.5 [in_us] via TOPICAL
  Filled 2016-12-23: qty 1

## 2016-12-23 MED ORDER — TECHNETIUM TC 99M TETROFOSMIN IV KIT
30.0000 | PACK | Freq: Once | INTRAVENOUS | Status: AC | PRN
  Administered 2016-12-23: 30.4 via INTRAVENOUS

## 2016-12-23 MED ORDER — PREDNISONE 20 MG PO TABS
50.0000 mg | ORAL_TABLET | Freq: Four times a day (QID) | ORAL | Status: AC
  Administered 2016-12-23 – 2016-12-24 (×3): 50 mg via ORAL
  Filled 2016-12-23 (×3): qty 2
  Filled 2016-12-23: qty 1

## 2016-12-23 MED ORDER — SODIUM CHLORIDE 0.9 % WEIGHT BASED INFUSION: 1.0000 mL/kg/h | INTRAVENOUS | Status: DC

## 2016-12-23 MED ORDER — POTASSIUM CHLORIDE CRYS ER 20 MEQ PO TBCR
40.0000 meq | EXTENDED_RELEASE_TABLET | ORAL | Status: AC
  Administered 2016-12-23 (×2): 40 meq via ORAL
  Filled 2016-12-23 (×2): qty 2

## 2016-12-23 NOTE — Progress Notes (Signed)
   Progress Note  Patient Name: Yesenia [name] Date of Encounter: 12/23/2016  [facility] results are as follows:  No diagnostic ST segment changes to indicate ischemia.  Small, mild intensity, basal inferoseptal defect that is fixed and most consistent with soft tissue attenuation. Of note, the TID ratio was significantly increased at 1.42 raising the possibility of subendocardial or balanced ischemia.  This is an intermediate risk study.  Nuclear stress EF: 63%.  In light of the patient's recurrent chest pain and features on Myoview that do not exclude the possibility of subendocardial or balanced ischemia, we will plan to discuss with her consideration of diagnostic cardiac catheterization for definitive assessment of her coronaries.  Signed, Rozann Lesches, MD  12/23/2016, 11:23 AM

## 2016-12-23 NOTE — Progress Notes (Signed)
Patient states "I feel off, like something is not right". Patient c/o midsternal pain radiating to the right shoulder and neck. Patient Vitals: B/P 113/51 , pulse 77 Spo2: 96% [place] 2L. MD notified. Patient on telemetry monitoring, Normal Sinus Rhythm.

## 2016-12-23 NOTE — Progress Notes (Signed)
Dr. Domenic Polite recommends cardiac cath to further diagnose her chest pain. She is agreeable. Will transfer today for cath tomorrow. Has history of dye allergy. Will premedicate. I have reviewed the risks, indications, and alternatives to angioplasty and stenting with the patient. Risks include but are not limited to bleeding, infection, vascular injury, stroke, myocardial infection, arrhythmia, kidney injury, radiation-related injury in the case of prolonged fluoroscopy use, emergency cardiac surgery, and death. The patient understands the risks of serious complication is low (<1%) and he agrees to proceed.
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
## 2016-12-23 NOTE — Progress Notes (Signed)
Progress Note  Patient Name: Yesenia [name] Date of Encounter: 12/23/2016  Primary Cardiologist: Dr. Kate Sable  Subjective   Two episodes of chest pain yesterday, described as choking sensation into right arm, relieved with morphine. No shortness of breath or palpitations.  Inpatient Medications    Scheduled Meds: . aspirin  325 mg Oral Daily  . busPIRone  10 mg Oral TID  . DULoxetine  60 mg Oral Daily  . famotidine  10 mg Oral Daily  . heparin  5,000 Units Subcutaneous Q8H  . Influenza vac split quadrivalent PF  0.5 mL Intramuscular Tomorrow-1000  . insulin aspart  0-20 Units Subcutaneous TID WC  . levothyroxine  150 mcg Oral QAC breakfast  . linaclotide  72 mcg Oral QAC breakfast  . mirabegron ER  50 mg Oral Daily  . oxyCODONE-acetaminophen  1 tablet Oral BID  . pantoprazole  40 mg Oral Daily  . pregabalin  100 mg Oral TID  . simvastatin  10 mg Oral QHS  . traZODone  150 mg Oral QHS   Continuous Infusions: . cefTRIAXone (ROCEPHIN)  IV Stopped (12/22/16 1246)   PRN Meds: acetaminophen, hydrALAZINE, methocarbamol, morphine injection, ondansetron (ZOFRAN) IV, technetium tetrofosmin   Vital Signs    Vitals:   12/22/16 2300 12/23/16 0300 12/23/16 0324 12/23/16 0701  BP: 114/61 (!) 87/47 (!) 128/51 (!) 126/53  Pulse: 75 72  77  Resp: 18 18  18   Temp: 98.1 F (36.7 C) (!) 97.5 F (36.4 C)  (!) 97.3 F (36.3 C)  TempSrc: Oral Oral  Oral  SpO2: 94% 92%  94%  Weight:      Height:        Intake/Output Summary (Last 24 hours) at 12/23/16 0930 Last data filed at 12/22/16 1424  Gross per 24 hour  Intake           805.42 ml  Output                0 ml  Net           805.42 ml   Filed Weights   12/21/16 2054 12/22/16 0053  Weight: 242 lb (109.8 kg) 236 lb 1.8 oz (107.1 kg)    Telemetry    NSR - Personally Reviewed  ECG    NSR, poor R wave progression anteriorly  - Personally Reviewed  Physical Exam    GEN: Obese woman, no acute distress.     Neck: No JVD Cardiac: RRR, no gallop.  Respiratory: Clear to auscultation bilaterally. GI: Soft, nontender, non-distended  MS: No edema; No deformity.  Labs    Chemistry Recent Labs Lab 12/21/16 2130 12/23/16 0557  NA 135 135  K 2.8* 3.3*  CL 93* 100*  CO2 29 29  GLUCOSE 103* 107*  BUN 12 12  CREATININE 1.43* 1.08*  CALCIUM 9.5 9.0  PROT  --  5.9*  ALBUMIN  --  3.0*  AST  --  19  ALT  --  9*  ALKPHOS  --  72  BILITOT  --  0.4  GFRNONAA 38* 52*  GFRAA 43* >60  ANIONGAP 13 6     Hematology Recent Labs Lab 12/21/16 2130 12/23/16 0557  WBC 7.2 5.5  RBC 5.27* 4.33  HGB 14.2 11.8*  HCT 44.9 38.0  MCV 85.2 87.8  MCH 26.9 27.3  MCHC 31.6 31.1  RDW 15.0 15.0  PLT 349 275    Cardiac Enzymes Recent Labs Lab 12/22/16 0127 12/22/16 0809  TROPONINI <0.03 <0.03  Recent Labs Lab 12/21/16 2137  TROPIPOC 0.00     DDimer  Recent Labs Lab 12/22/16 1130  DDIMER <0.27     Radiology    Dg Chest 2 View  Result Date: 12/21/2016 CLINICAL DATA:  Mid upper and right chest pain and shortness of breath for 2 hours. History of diabetes, hypertension, pulmonary embolus, pneumonia, cardiac catheterization, loop recorder. EXAM: CHEST  2 VIEW COMPARISON:  08/03/2016 FINDINGS: Shallow inspiration. Mild cardiac enlargement. No vascular congestion or edema. No airspace disease or consolidation in the lungs. No blunting of costophrenic angles. No pneumothorax. Mediastinal contours appear intact. Tortuous aorta. Postoperative changes in the right humerus. Degenerative changes in the spine. IMPRESSION: No active cardiopulmonary disease. Electronically Signed   By: Lucienne Capers M.D.   On: 12/21/2016 21:35   Ct Angio Chest Pe W Or Wo Contrast  Result Date: 12/22/2016 CLINICAL DATA:  Chest pain and short of breath since last night. EXAM: CT ANGIOGRAPHY CHEST WITH CONTRAST TECHNIQUE: Multidetector CT imaging of the chest was performed using the standard protocol during bolus  administration of intravenous contrast. Multiplanar CT image reconstructions and MIPs were obtained to evaluate the vascular anatomy. CONTRAST:  80 cc Isovue 370 COMPARISON:  01/11/2016 FINDINGS: Cardiovascular: There are no filling defects in the pulmonary arterial tree to suggest acute pulmonary thromboembolism. There is also no evidence of aortic dissection or aneurysm. Mild atherosclerotic calcification of the aortic arch. Great vessels are grossly patent. Mediastinum/Nodes: No abnormal mediastinal adenopathy. Thyroid is a trophic. Esophagus is unremarkable. No pericardial effusion. Lungs/Pleura: No pneumothorax or pleural effusion. Dependent atelectasis. Upper Abdomen: Postcholecystectomy.  No acute process. Musculoskeletal: Stable thoracic spine. Review of the MIP images confirms the above findings. IMPRESSION: No evidence of pulmonary embolism. Aortic Atherosclerosis (ICD10-I70.0). Electronically Signed   By: Marybelle Killings M.D.   On: 12/22/2016 13:47   US Renal  Result Date: 12/22/2016 CLINICAL DATA:  Acute renal insufficiency EXAM: RENAL ULTRASOUND COMPARISON:  CT abdomen and pelvis December 06, 2012 FINDINGS: Right Kidney: Length: 11.4 cm. Echogenicity and renal cortical thickness are within normal limits. No perinephric fluid or hydronephrosis visualized. A cyst arises from the upper pole the right kidney measuring 2.0 x 1.6 x 1.9 cm. No sonographically demonstrable calculus or ureterectasis. Left Kidney: Length: 11.4 cm. Echogenicity and renal cortical thickness are within normal limits. No mass, perinephric fluid, or hydronephrosis visualized. No sonographically demonstrable calculus or ureterectasis. Bladder: The urinary bladder is empty and cannot be assessed currently. IMPRESSION: Small cyst arising from upper pole right kidney. Kidneys otherwise appear unremarkable. Electronically Signed   By: Lowella Grip III M.D.   On: 12/22/2016 09:19    Cardiac Studies   Echocardiogram  12/22/2016: Study Conclusions   - Left ventricle: The cavity size was normal. There was mild focal   basal hypertrophy of the septum. Systolic function was normal.   The estimated ejection fraction was in the range of 60% to 65%.   Wall motion was normal; there were no regional wall motion   abnormalities. Doppler parameters are consistent with abnormal   left ventricular relaxation (grade 1 diastolic dysfunction). - Aortic valve: Mildly calcified annulus. Trileaflet. - Mitral valve: Mildly to moderately calcified annulus. There was   trivial regurgitation. - Right atrium: Central venous pressure (est): 3 mm Hg. - Atrial septum: No defect or patent foramen ovale was identified. - Tricuspid valve: There was trivial regurgitation. - Pericardium, extracardiac: There was no pericardial effusion.   Impressions:   - Mild basal septal LV hypertrophy with  LVEF 60-65% and grade 1   diastolic dysfunction. Mild to moderate mitral annular   calcification with trivial mitral regurgitation. Trivial   tricuspid regurgitation.   Patient Profile     Yesenia [name] is a 66 y.o. female with a history of normal cardiac catheterization greater than 20 years ago, prior pulmonary embolus and DVT back in 2014, type 2 diabetes mellitus, and hypertension who is being seen today for the evaluation of chest pain. She has ruled out for ACS with normal troponin I levels.  Assessment & Plan    1. Chest pain typical and atypical features with negative Troponins and normal LVEF on echo. Lexiscan done this morning - await images.  2. Hx of PE and DVT-CT negative for PE this admission.  3. HTN on HCTZ.  4. HLD on Zocor.    Signed, Ermalinda Barrios, PA-C  12/23/2016, 9:30 AM     Attending note:  Patient seen and examined. I reviewed the interval chart including vital signs, lab work, and telemetry. Case discussed with Ms. Bonnell Public PA-C. She underwent Lexiscan Myoview this morning, results in process. We will  review final images and make further recommendations.  Satira Sark, M.D., F.A.C.C.

## 2016-12-23 NOTE — Progress Notes (Signed)
PROGRESS NOTE    Yesenia [name]   HQI:[id number]  DOB: 10-Jul-1950  DOA: 12/21/2016 PCP: [name], MD   Brief Narrative:  Yesenia [name] 66 y/o with DM, HTN, h/o DVT/ PE, anxiety who presented with atypical chest pain, right arm pain and back pain. She states she has fibromyalgia and sees a pain management doctor.    Subjective: Ongoing intermittent chest pain today. No other complaints.  ROS: no complaints of nausea, vomiting, constipation diarrhea, cough, dyspnea or dysuria. No other complaints.   Assessment & Plan:   Principal Problem:   Chest pain - Myoview shows: Small, mild intensity, basal inferoseptal defect and 'subendocardial or balanced ischemia' - will have cardiac cath tomorrow - have added Nitro paste for intermittent chest pain - cont other pain medications that she takes at home  Active Problems:   Fibromyalgia - cont home meds    AKI (acute kidney injury) - improving - takes Lasix PRN at home but states she has been taking it daily lately for ankle swelling. She also takes HCTZ lately. - have held both especially as BP has been normal  Pedal edema - use TEDS and elevation instead- discussed with patient to use lasix only in severe situations   Hypokalemia - likely in setting of using Lasix 40 mg daily and HCTZ without adequate replacement of K- she is on 10 meq a day - replacing   Essential hypertension, benign - hold HCTZ - BP normal at this time    Hypothyroidism - synthroid    Anxiety - stable    Type II diabetes mellitus with neurological manifestations  - SSI    GERD - Zantac, Dexilant  DVT prophylaxis: Heparin Code Status: Full code Family Communication:   Disposition Plan: home hopefully tomorrow Consultants:   cardiology Procedures:   Myoview   2 D ECHO Mild basal septal LV hypertrophy with LVEF 60-65% and grade 1   diastolic dysfunction. Mild to moderate mitral annular   calcification with trivial mitral  regurgitation. Trivial   tricuspid regurgitation. Antimicrobials:  Anti-infectives    Start     Dose/Rate Route Frequency Ordered Stop   12/22/16 1130  cefTRIAXone (ROCEPHIN) 1 g in dextrose 5 % 50 mL IVPB     1 g 100 mL/hr over 30 Minutes Intravenous Every 24 hours 12/22/16 1125         Objective: Vitals:   12/23/16 0701 12/23/16 1300 12/23/16 1504 12/23/16 1512  BP: (!) 126/53 (!) 134/49 (!) 113/51   Pulse: 77 71 77   Resp: 18 18    Temp: (!) 97.3 F (36.3 C) 98.3 F (36.8 C)    TempSrc: Oral Oral    SpO2: 94% 97% 97%   Weight:    111 kg (244 lb 11.4 oz)  Height:        Intake/Output Summary (Last 24 hours) at 12/23/16 1749 Last data filed at 12/23/16 1726  Gross per 24 hour  Intake              480 ml  Output                0 ml  Net              480 ml   Filed Weights   12/21/16 2054 12/22/16 0053 12/23/16 1512  Weight: 109.8 kg (242 lb) 107.1 kg (236 lb 1.8 oz) 111 kg (244 lb 11.4 oz)    Examination: General exam: Appears comfortable  HEENT: PERRLA, oral  mucosa moist, no sclera icterus or thrush Respiratory system: Clear to auscultation. Respiratory effort normal. Cardiovascular system: S1 & S2 heard, RRR.  No murmurs  Gastrointestinal system: Abdomen soft, non-tender, nondistended. Normal bowel sound. No organomegaly Central nervous system: Alert and oriented. No focal neurological deficits. Extremities: No cyanosis, clubbing or edema Skin: No rashes or ulcers Psychiatry:  Mood & affect appropriate.     Data Reviewed: I have personally reviewed following labs and imaging studies  CBC:  Recent Labs Lab 12/21/16 2130 12/23/16 0557  WBC 7.2 5.5  HGB 14.2 11.8*  HCT 44.9 38.0  MCV 85.2 87.8  PLT 349 063   Basic Metabolic Panel:  Recent Labs Lab 12/21/16 2130 12/21/16 2132 12/23/16 0557  NA 135  --  135  K 2.8*  --  3.3*  CL 93*  --  100*  CO2 29  --  29  GLUCOSE 103*  --  107*  BUN 12  --  12  CREATININE 1.43*  --  1.08*  CALCIUM 9.5   --  9.0  MG  --  2.0  --   PHOS  --  3.7  --    GFR: Estimated Creatinine Clearance: 59.1 mL/min (A) (by C-G formula based on SCr of 1.08 mg/dL (H)). Liver Function Tests:  Recent Labs Lab 12/23/16 0557  AST 19  ALT 9*  ALKPHOS 72  BILITOT 0.4  PROT 5.9*  ALBUMIN 3.0*   No results for input(s): LIPASE, AMYLASE in the last 168 hours. No results for input(s): AMMONIA in the last 168 hours. Coagulation Profile: No results for input(s): INR, PROTIME in the last 168 hours. Cardiac Enzymes:  Recent Labs Lab 12/22/16 0127 12/22/16 0809  TROPONINI <0.03 <0.03   BNP (last 3 results) No results for input(s): PROBNP in the last 8760 hours. HbA1C: No results for input(s): HGBA1C in the last 72 hours. CBG:  Recent Labs Lab 12/22/16 1109 12/22/16 1639 12/22/16 2022 12/23/16 0748 12/23/16 1652  GLUCAP 94 121* 124* 103* 103*   Lipid Profile: No results for input(s): CHOL, HDL, LDLCALC, TRIG, CHOLHDL, LDLDIRECT in the last 72 hours. Thyroid Function Tests: No results for input(s): TSH, T4TOTAL, FREET4, T3FREE, THYROIDAB in the last 72 hours. Anemia Panel: No results for input(s): VITAMINB12, FOLATE, FERRITIN, TIBC, IRON, RETICCTPCT in the last 72 hours. Urine analysis:    Component Value Date/Time   COLORURINE YELLOW 12/22/2016 0214   APPEARANCEUR CLOUDY (A) 12/22/2016 0214   LABSPEC 1.017 12/22/2016 0214   PHURINE 5.0 12/22/2016 0214   GLUCOSEU NEGATIVE 12/22/2016 0214   HGBUR SMALL (A) 12/22/2016 0214   BILIRUBINUR NEGATIVE 12/22/2016 0214   KETONESUR NEGATIVE 12/22/2016 0214   PROTEINUR 30 (A) 12/22/2016 0214   UROBILINOGEN 0.2 12/30/2012 1300   NITRITE NEGATIVE 12/22/2016 0214   LEUKOCYTESUR LARGE (A) 12/22/2016 0214   Sepsis Labs: @LABRCNTIP (procalcitonin:4,lacticidven:4) )No results found for this or any previous visit (from the past 240 hour(s)).       Radiology Studies: Dg Chest 2 View  Result Date: 12/21/2016 CLINICAL DATA:  Mid upper and right  chest pain and shortness of breath for 2 hours. History of diabetes, hypertension, pulmonary embolus, pneumonia, cardiac catheterization, loop recorder. EXAM: CHEST  2 VIEW COMPARISON:  08/03/2016 FINDINGS: Shallow inspiration. Mild cardiac enlargement. No vascular congestion or edema. No airspace disease or consolidation in the lungs. No blunting of costophrenic angles. No pneumothorax. Mediastinal contours appear intact. Tortuous aorta. Postoperative changes in the right humerus. Degenerative changes in the spine. IMPRESSION: No active cardiopulmonary disease.  Electronically Signed   By: Lucienne Capers M.D.   On: 12/21/2016 21:35   Ct Angio Chest Pe W Or Wo Contrast  Result Date: 12/22/2016 CLINICAL DATA:  Chest pain and short of breath since last night. EXAM: CT ANGIOGRAPHY CHEST WITH CONTRAST TECHNIQUE: Multidetector CT imaging of the chest was performed using the standard protocol during bolus administration of intravenous contrast. Multiplanar CT image reconstructions and MIPs were obtained to evaluate the vascular anatomy. CONTRAST:  80 cc Isovue 370 COMPARISON:  01/11/2016 FINDINGS: Cardiovascular: There are no filling defects in the pulmonary arterial tree to suggest acute pulmonary thromboembolism. There is also no evidence of aortic dissection or aneurysm. Mild atherosclerotic calcification of the aortic arch. Great vessels are grossly patent. Mediastinum/Nodes: No abnormal mediastinal adenopathy. Thyroid is a trophic. Esophagus is unremarkable. No pericardial effusion. Lungs/Pleura: No pneumothorax or pleural effusion. Dependent atelectasis. Upper Abdomen: Postcholecystectomy.  No acute process. Musculoskeletal: Stable thoracic spine. Review of the MIP images confirms the above findings. IMPRESSION: No evidence of pulmonary embolism. Aortic Atherosclerosis (ICD10-I70.0). Electronically Signed   By: Marybelle Killings M.D.   On: 12/22/2016 13:47   US Renal  Result Date: 12/22/2016 CLINICAL DATA:   Acute renal insufficiency EXAM: RENAL ULTRASOUND COMPARISON:  CT abdomen and pelvis December 06, 2012 FINDINGS: Right Kidney: Length: 11.4 cm. Echogenicity and renal cortical thickness are within normal limits. No perinephric fluid or hydronephrosis visualized. A cyst arises from the upper pole the right kidney measuring 2.0 x 1.6 x 1.9 cm. No sonographically demonstrable calculus or ureterectasis. Left Kidney: Length: 11.4 cm. Echogenicity and renal cortical thickness are within normal limits. No mass, perinephric fluid, or hydronephrosis visualized. No sonographically demonstrable calculus or ureterectasis. Bladder: The urinary bladder is empty and cannot be assessed currently. IMPRESSION: Small cyst arising from upper pole right kidney. Kidneys otherwise appear unremarkable. Electronically Signed   By: Lowella Grip III M.D.   On: 12/22/2016 09:19   Nm Myocar Multi W/spect W/wall Motion / Ef  Result Date: 12/23/2016  No diagnostic ST segment changes to indicate ischemia.  Small, mild intensity, basal inferoseptal defect that is fixed and most consistent with soft tissue attenuation. Of note, the TID ratio was significantly increased at 1.42 raising the possibility of subendocardial or balanced ischemia.  This is an intermediate risk study.  Nuclear stress EF: 63%.       Scheduled Meds: . aspirin  325 mg Oral Daily  . busPIRone  10 mg Oral TID  . [START ON 12/24/2016] diphenhydrAMINE  50 mg Oral Once   Or  . [START ON 12/24/2016] diphenhydrAMINE  50 mg Intravenous Once  . DULoxetine  60 mg Oral Daily  . famotidine  10 mg Oral Daily  . heparin  5,000 Units Subcutaneous Q8H  . insulin aspart  0-20 Units Subcutaneous TID WC  . levothyroxine  150 mcg Oral QAC breakfast  . linaclotide  72 mcg Oral QAC breakfast  . mirabegron ER  50 mg Oral Daily  . nitroGLYCERIN  0.5 inch Topical Q6H  . oxyCODONE-acetaminophen  1 tablet Oral BID  . pantoprazole  40 mg Oral Daily  . predniSONE  50 mg Oral  Q6H  . pregabalin  100 mg Oral TID  . simvastatin  10 mg Oral QHS  . sodium chloride flush  3 mL Intravenous Q12H  . traZODone  150 mg Oral QHS   Continuous Infusions: . sodium chloride    . [START ON 12/24/2016] sodium chloride     Followed by  . [START  ON 12/24/2016] sodium chloride    . cefTRIAXone (ROCEPHIN)  IV Stopped (12/23/16 1052)     LOS: 0 days    Time spent in minutes: 35    Debbe Odea, MD [facility] Pager: [email or web address] Password [facility] 12/23/2016, 5:49 PM

## 2016-12-24 ENCOUNTER — Encounter (HOSPITAL_COMMUNITY): Payer: Self-pay | Admitting: Cardiovascular Disease

## 2016-12-24 ENCOUNTER — Inpatient Hospital Stay
Admission: RE | Admit: 2016-12-24 | Discharge: 2016-12-24 | Disposition: A | Discharge status: Home / Self Care | Payer: Self-pay | Source: Ambulatory Visit | Attending: Neurosurgery | Admitting: Neurosurgery

## 2016-12-24 ENCOUNTER — Encounter (HOSPITAL_COMMUNITY)
Admission: EM | Disposition: A | Discharge status: Home / Self Care | Payer: Self-pay | Source: Home / Self Care | Attending: Emergency Medicine

## 2016-12-24 DIAGNOSIS — E038 Other specified hypothyroidism: Secondary | ICD-10-CM | POA: Diagnosis not present

## 2016-12-24 DIAGNOSIS — R9439 Abnormal result of other cardiovascular function study: Secondary | ICD-10-CM | POA: Diagnosis not present

## 2016-12-24 DIAGNOSIS — I200 Unstable angina: Secondary | ICD-10-CM | POA: Diagnosis not present

## 2016-12-24 DIAGNOSIS — E876 Hypokalemia: Secondary | ICD-10-CM

## 2016-12-24 DIAGNOSIS — R079 Chest pain, unspecified: Secondary | ICD-10-CM

## 2016-12-24 DIAGNOSIS — N179 Acute kidney failure, unspecified: Secondary | ICD-10-CM | POA: Diagnosis not present

## 2016-12-24 DIAGNOSIS — I10 Essential (primary) hypertension: Secondary | ICD-10-CM | POA: Diagnosis not present

## 2016-12-24 HISTORY — PX: LEFT HEART CATH AND CORONARY ANGIOGRAPHY: CATH118249

## 2016-12-24 LAB — GLUCOSE, CAPILLARY
GLUCOSE-CAPILLARY: 188 mg/dL — AB (ref 65–99)
Glucose-Capillary: 160 mg/dL — ABNORMAL HIGH (ref 65–99)
Glucose-Capillary: 139 mg/dL — ABNORMAL HIGH (ref 65–99)

## 2016-12-24 LAB — PROTIME-INR
INR: 0.99
Prothrombin Time: 13 seconds (ref 11.4–15.2)

## 2016-12-24 LAB — BASIC METABOLIC PANEL
Anion gap: 8 (ref 5–15)
BUN: 10 mg/dL (ref 6–20)
CALCIUM: 9.3 mg/dL (ref 8.9–10.3)
CO2: 22 mmol/L (ref 22–32)
CREATININE: 0.78 mg/dL (ref 0.44–1.00)
Chloride: 104 mmol/L (ref 101–111)
GFR calc non Af Amer: >60 mL/min (ref >60)
Glucose, Bld: 149 mg/dL — ABNORMAL HIGH (ref 65–99)
Potassium: 4.5 mmol/L (ref 3.5–5.1)
SODIUM: 134 mmol/L — AB (ref 135–145)

## 2016-12-24 SURGERY — LEFT HEART CATH AND CORONARY ANGIOGRAPHY
Anesthesia: LOCAL

## 2016-12-24 MED ORDER — VERAPAMIL HCL 2.5 MG/ML IV SOLN
INTRAVENOUS | Status: DC | PRN
  Administered 2016-12-24: 09:00:00 via INTRA_ARTERIAL
  Administered 2016-12-24: 10 mL via INTRA_ARTERIAL

## 2016-12-24 MED ORDER — LIDOCAINE HCL 2 % IJ SOLN
INTRAMUSCULAR | Status: AC
  Filled 2016-12-24: qty 10

## 2016-12-24 MED ORDER — MIDAZOLAM HCL 2 MG/2ML IJ SOLN
INTRAMUSCULAR | Status: AC
  Filled 2016-12-24: qty 2

## 2016-12-24 MED ORDER — FENTANYL CITRATE (PF) 100 MCG/2ML IJ SOLN
INTRAMUSCULAR | Status: DC | PRN
  Administered 2016-12-24: 25 ug via INTRAVENOUS
  Administered 2016-12-24: 50 ug via INTRAVENOUS

## 2016-12-24 MED ORDER — DICLOFENAC SODIUM 1 % TD GEL
2.0000 g | Freq: Four times a day (QID) | TRANSDERMAL | Status: DC
  Administered 2016-12-24: 2 g via TOPICAL
  Filled 2016-12-24: qty 100

## 2016-12-24 MED ORDER — SODIUM CHLORIDE 0.9% FLUSH: 3.0000 mL | Freq: Two times a day (BID) | INTRAVENOUS | Status: DC

## 2016-12-24 MED ORDER — HEPARIN SODIUM (PORCINE) 1000 UNIT/ML IJ SOLN
INTRAMUSCULAR | Status: AC
  Filled 2016-12-24: qty 1

## 2016-12-24 MED ORDER — FENTANYL CITRATE (PF) 100 MCG/2ML IJ SOLN
INTRAMUSCULAR | Status: AC
  Filled 2016-12-24: qty 2

## 2016-12-24 MED ORDER — MIDAZOLAM HCL 2 MG/2ML IJ SOLN
INTRAMUSCULAR | Status: DC | PRN
  Administered 2016-12-24: 1 mg via INTRAVENOUS
  Administered 2016-12-24: 2 mg via INTRAVENOUS

## 2016-12-24 MED ORDER — DICLOFENAC SODIUM 1 % TD GEL: 2.0000 g | Freq: Four times a day (QID) | TRANSDERMAL | Status: DC

## 2016-12-24 MED ORDER — IOPAMIDOL (ISOVUE-370) INJECTION 76%
INTRAVENOUS | Status: DC | PRN
  Administered 2016-12-24: 95 mL via INTRAVENOUS

## 2016-12-24 MED ORDER — SODIUM CHLORIDE 0.9 % IV SOLN: INTRAVENOUS | Status: DC

## 2016-12-24 MED ORDER — VERAPAMIL HCL 2.5 MG/ML IV SOLN
INTRAVENOUS | Status: AC
  Filled 2016-12-24: qty 2

## 2016-12-24 MED ORDER — HEPARIN SODIUM (PORCINE) 1000 UNIT/ML IJ SOLN
INTRAMUSCULAR | Status: DC | PRN
  Administered 2016-12-24: 5500 [IU] via INTRAVENOUS

## 2016-12-24 MED ORDER — SODIUM CHLORIDE 0.9 % IV SOLN: 250.0000 mL | INTRAVENOUS | Status: DC | PRN

## 2016-12-24 MED ORDER — LIDOCAINE HCL (PF) 1 % IJ SOLN
INTRAMUSCULAR | Status: DC | PRN
  Administered 2016-12-24: 2 mL

## 2016-12-24 MED ORDER — HEPARIN (PORCINE) IN NACL 2-0.9 UNIT/ML-% IJ SOLN
INTRAMUSCULAR | Status: DC | PRN
  Administered 2016-12-24: 09:00:00

## 2016-12-24 MED ORDER — IOPAMIDOL (ISOVUE-370) INJECTION 76%
INTRAVENOUS | Status: AC
  Filled 2016-12-24: qty 100

## 2016-12-24 MED ORDER — SODIUM CHLORIDE 0.9% FLUSH: 3.0000 mL | INTRAVENOUS | Status: DC | PRN

## 2016-12-24 MED ORDER — HEPARIN (PORCINE) IN NACL 2-0.9 UNIT/ML-% IJ SOLN
INTRAMUSCULAR | Status: AC
  Filled 2016-12-24: qty 1000

## 2016-12-24 MED ORDER — ACETAMINOPHEN 325 MG PO TABS: 650.0000 mg | ORAL_TABLET | ORAL | Status: DC | PRN

## 2016-12-24 MED ORDER — ONDANSETRON HCL 4 MG/2ML IJ SOLN: 4.0000 mg | Freq: Four times a day (QID) | INTRAMUSCULAR | Status: DC | PRN

## 2016-12-24 MED ORDER — DIAZEPAM 5 MG PO TABS: 5.0000 mg | ORAL_TABLET | Freq: Four times a day (QID) | ORAL | Status: DC | PRN

## 2016-12-24 SURGICAL SUPPLY — 11 items
CATH 5FR JL3.5 JR4 ANG PIG MP (CATHETERS) ×1 IMPLANT
DEVICE RAD COMP TR BAND LRG (VASCULAR PRODUCTS) ×1 IMPLANT
GLIDESHEATH SLEND SS 6F .021 (SHEATH) ×1 IMPLANT
GUIDEWIRE INQWIRE 1.5J.035X260 (WIRE) IMPLANT
INQWIRE 1.5J .035X260CM (WIRE) ×2
KIT HEART LEFT (KITS) ×2 IMPLANT
PACK CARDIAC CATHETERIZATION (CUSTOM PROCEDURE TRAY) ×2 IMPLANT
SYR MEDRAD MARK V 150ML (SYRINGE) ×2 IMPLANT
TRANSDUCER W/STOPCOCK (MISCELLANEOUS) ×2 IMPLANT
TUBING CIL FLEX 10 FLL-RA (TUBING) ×2 IMPLANT
WIRE HI TORQ VERSACORE-J 145CM (WIRE) ×1 IMPLANT

## 2016-12-24 NOTE — Progress Notes (Signed)
   Okay to discharge later today. No further cardiology follow-up necessary. Reassuring cardiac catheterization.  Candee Furbish, MD

## 2016-12-24 NOTE — Discharge Instructions (Signed)
See your doctor in 1-2 wks.  Please do not take Lasix and HCTZ for now. If you ever do go back on Lasix, you will likely need a potassium daily each time you take Lasix.  Use TEDS for ankle swelling. Elevated ankles up to the level of your abdomen when sitting  Check your BP daily and record it and take to your doctor at each visit.  Try to lose weight to prevent complications including heart attacks and strokes.   Please take all your medications with you for your next visit with your Primary MD. Please request your Primary MD to go over all hospital test results at the follow up. Please ask your Primary MD to get all Hospital records sent to his/her office.  If you experience worsening of your admission symptoms, develop shortness of breath, chest pain, suicidal or homicidal thoughts or a life threatening emergency, you must seek medical attention immediately by calling 911 or calling your MD.  Dennis Bast must read the complete instructions/literature along with all the possible adverse reactions/side effects for all the medicines you take including new medications that have been prescribed to you. Take new medicines after you have completely understood and accpet all the possible adverse reactions/side effects.   Do not drive when taking pain medications or sedatives.    Do not take more than prescribed Pain, Sleep and Anxiety Medications  If you have smoked or chewed Tobacco in the last 2 yrs please stop. Stop any regular alcohol and or recreational drug use.  Wear Seat belts while driving.

## 2016-12-24 NOTE — H&P (View-Only) (Signed)
Dr. Domenic Polite recommends cardiac cath to further diagnose her chest pain. She is agreeable. Will transfer today for cath tomorrow. Has history of dye allergy. Will premedicate. I have reviewed the risks, indications, and alternatives to angioplasty and stenting with the patient. Risks include but are not limited to bleeding, infection, vascular injury, stroke, myocardial infection, arrhythmia, kidney injury, radiation-related injury in the case of prolonged fluoroscopy use, emergency cardiac surgery, and death. The patient understands the risks of serious complication is low (<7%) and he agrees to proceed.

## 2016-12-24 NOTE — Discharge Summary (Signed)
Physician Discharge Summary  Yesenia [name] XKG:[id number] DOB: [birth year] DOA: 12/21/2016  PCP: [name], MD  Admit date: 12/21/2016 Discharge date: 12/24/2016  Admitted From: home  Disposition:  home   Recommendations for Outpatient Follow-up:  1. F/u for hypotension, discussion regarding need for weight loss in 1-2 wks- d/w patient 2   Lasix and HCTZ on hold 3. Recommend Bmet at follow up to check Na+ and K+  Discharge Condition:  stable   CODE STATUS:  Full code   Consultations:  Cardiology     Discharge Diagnoses:  Principal Problem:   Chest pain at rest Active Problems:      Hypokalemia   AKI (acute kidney injury) ([place])   Abnormal nuclear stress test   GERD   Essential hypertension, benign   Anxiety   Fibromyalgia   Sleep apnea   Type II diabetes mellitus with neurological manifestations ([place])     Subjective: Mild pain in upper chest which she describes as pressure and is worse when she presses on it.   Brief Summary: Yesenia [name] 66 y/o with DM, HTN, h/o DVT/ PE, anxiety who presented with atypical chest pain, right arm pain and back pain. She states she has fibromyalgia and sees a pain management doctor. A cardiology consult was requested by the admitting doctor.    Hospital Course:  Principal Problem:   Chest pain - Troponin were negative and EKG showed LAFB- cardiology recommended a Myoview stress test - Myoview shows: Small, mild intensity, basal inferoseptal defect and 'subendocardial or balanced ischemia' - due to continued intermittent chest pain and above results, she was transferred to [facility] for cardiac cath which shows: Normal LV function with an ejection fraction of 55-60% without focal segmental wall motion abnormalities. Normal coronary arteries in a left dominant coronary circulation. - recommendations per cardiology are : Medical therapy for hypertension and comorbidities.  Risk factor modification.  Weight loss -  During  this hospital stay, he BP has been low (see below in regards to this)  Active Problems:   Fibromyalgia - cont home meds- adding Voltaren gel for reproducible chest pain  Hypotension- history of Essential hypertension, benign - hold HCTZ - BP normal at this time- see below   AKI (acute kidney injury)/ hypokalemia - Cr 1.43 improved to 0.78 - K 2.8 improved to 134  - has Lasix ordered PRN at home (she states for ankle edema) - she has been taking it daily lately- She also takes HCTZ lately. - due to AKI, have held both especially as BP has been normal - has 10 meq KCL for replacement but this was likely not sufficient when she was taking daily Lasix  - would recommend being very careful with diuretics for ankle edema especially as she has only mild diastolic heart failure- have stopped both for now as BP is low- recommend 1-2 wk f/u and daily BP checks - she has a monitor at home    Pedal edema - none present while in the hospital- suspect she has venous stasis -  discussed with patient : use TEDS and elevation instead of daily Lasix     Hypothyroidism - synthroid    Anxiety - stable    Type II diabetes mellitus with neurological manifestations  -  Cont home meds    GERD - Zantac, Dexilant  Morbid obesity - need weight loss- PCP to follow Body mass index is 46.35 kg/m.   Discharge Instructions  Discharge Instructions    Diet -  low sodium heart healthy    Complete by:  As directed    Diet Carb Modified    Complete by:  As directed    Increase activity slowly    Complete by:  As directed      Allergies as of 12/24/2016      Reactions   Acyclovir And Related    Aspirin Other (See Comments)   G.I. Upset    Nalbuphine Itching, Swelling, Rash   Nubaine      Medication List    STOP taking these medications   ALLERGY 10 MG tablet Generic drug:  loratadine   furosemide 40 MG tablet Commonly known as:  LASIX   hydrochlorothiazide 25 MG tablet Commonly  known as:  HYDRODIURIL   potassium chloride 10 MEQ CR capsule Commonly known as:  MICRO-K     TAKE these medications   alendronate 70 MG tablet Commonly known as:  FOSAMAX Take 1 tablet (70 mg total) by mouth once a week. Take with a full glass of water on an empty stomach. What changed:  when to take this  additional instructions   Blood Glucose System Pak Kit Please dispense based on patient and insurance preference. Monitor FSBS 3x daily. Dx: E11.65   BLOOD GLUCOSE TEST STRIPS Strp Please dispense based on patient and insurance preference. Monitor FSBS 3x daily. Dx: E11.65   busPIRone 10 MG tablet Commonly known as:  BUSPAR TAKE 1 TABLET BY MOUTH 3 TIMES DAILY.   DEXILANT 60 MG capsule Generic drug:  dexlansoprazole TAKE 1 CAPSULE BY MOUTH ONCE A DAY.   diclofenac sodium 1 % Gel Commonly known as:  VOLTAREN Apply 2 g topically 4 (four) times daily.   DULoxetine 60 MG capsule Commonly known as:  CYMBALTA Take 60 mg by mouth daily.   Lancet Devices Misc Please dispense based on patient and insurance preference. Monitor FSBS 3x daily. Dx: E11.65   Lancets Misc Please dispense based on patient and insurance preference. Monitor FSBS 3x daily. Dx: E11.65   levothyroxine 150 MCG tablet Commonly known as:  SYNTHROID, LEVOTHROID TAKE (1) TABLET BY MOUTH ONCE DAILY BEFORE BREAKFAST.   linaclotide 72 MCG capsule Commonly known as:  LINZESS Take 1 capsule (72 mcg total) by mouth daily before breakfast.   liraglutide 18 MG/3ML Sopn Begin with 0.'6mg'$  SQ QD. Titrate up Q week by 0.'6mg'$  until 1.'8mg'$  is reached. What changed:  how much to take  how to take this  when to take this  additional instructions   mirabegron ER 50 MG Tb24 tablet Commonly known as:  MYRBETRIQ Take 1 tablet (50 mg total) by mouth daily.   oxyCODONE-acetaminophen 5-325 MG tablet Commonly known as:  PERCOCET/ROXICET Take 1 tablet by mouth 2 (two) times daily.   pregabalin 100 MG  capsule Commonly known as:  LYRICA Take 1 capsule (100 mg total) by mouth 3 (three) times daily.   ranitidine 150 MG tablet Commonly known as:  ZANTAC TAKE 1 TABLET BY MOUTH AT BEDTIME.   simvastatin 10 MG tablet Commonly known as:  ZOCOR TAKE 1 TABLET BY MOUTH AT BEDTIME.   traZODone 100 MG tablet Commonly known as:  DESYREL TAKE 1&1/2 TABLETS DAILY AT BEDTIME FOR SLEEP AND MOOD            Discharge Care Instructions        Start     Ordered   12/24/16 0000  diclofenac sodium (VOLTAREN) 1 % GEL  4 times daily     12/24/16 1400  12/24/16 0000  Increase activity slowly     12/24/16 1400   12/24/16 0000  Diet - low sodium heart healthy     12/24/16 1400   12/24/16 0000  Diet Carb Modified     12/24/16 1400     Follow-up Information    [place], Modena Nunnery, MD Follow up.   Specialty:  Family Medicine Why:  see PCP on Monday for BP check and Bmet (blood work). Contact information: [address] [place] [address] [phone]          Allergies  Allergen Reactions  . Acyclovir And Related   . Aspirin Other (See Comments)    G.I. Upset   . Nalbuphine Itching, Swelling and Rash    Nubaine      Procedures/Studies: 2 D ECHO Lexiscan Myoview  Dg Chest 2 View  Result Date: 12/21/2016 CLINICAL DATA:  Mid upper and right chest pain and shortness of breath for 2 hours. History of diabetes, hypertension, pulmonary embolus, pneumonia, cardiac catheterization, loop recorder. EXAM: CHEST  2 VIEW COMPARISON:  08/03/2016 FINDINGS: Shallow inspiration. Mild cardiac enlargement. No vascular congestion or edema. No airspace disease or consolidation in the lungs. No blunting of costophrenic angles. No pneumothorax. Mediastinal contours appear intact. Tortuous aorta. Postoperative changes in the right humerus. Degenerative changes in the spine. IMPRESSION: No active cardiopulmonary disease. Electronically Signed   By: Lucienne Capers M.D.   On: 12/21/2016 21:35    Ct Angio Chest Pe W Or Wo Contrast  Result Date: 12/22/2016 CLINICAL DATA:  Chest pain and short of breath since last night. EXAM: CT ANGIOGRAPHY CHEST WITH CONTRAST TECHNIQUE: Multidetector CT imaging of the chest was performed using the standard protocol during bolus administration of intravenous contrast. Multiplanar CT image reconstructions and MIPs were obtained to evaluate the vascular anatomy. CONTRAST:  80 cc Isovue 370 COMPARISON:  01/11/2016 FINDINGS: Cardiovascular: There are no filling defects in the pulmonary arterial tree to suggest acute pulmonary thromboembolism. There is also no evidence of aortic dissection or aneurysm. Mild atherosclerotic calcification of the aortic arch. Great vessels are grossly patent. Mediastinum/Nodes: No abnormal mediastinal adenopathy. Thyroid is a trophic. Esophagus is unremarkable. No pericardial effusion. Lungs/Pleura: No pneumothorax or pleural effusion. Dependent atelectasis. Upper Abdomen: Postcholecystectomy.  No acute process. Musculoskeletal: Stable thoracic spine. Review of the MIP images confirms the above findings. IMPRESSION: No evidence of pulmonary embolism. Aortic Atherosclerosis (ICD10-I70.0). Electronically Signed   By: Marybelle Killings M.D.   On: 12/22/2016 13:47   US Renal  Result Date: 12/22/2016 CLINICAL DATA:  Acute renal insufficiency EXAM: RENAL ULTRASOUND COMPARISON:  CT abdomen and pelvis December 06, 2012 FINDINGS: Right Kidney: Length: 11.4 cm. Echogenicity and renal cortical thickness are within normal limits. No perinephric fluid or hydronephrosis visualized. A cyst arises from the upper pole the right kidney measuring 2.0 x 1.6 x 1.9 cm. No sonographically demonstrable calculus or ureterectasis. Left Kidney: Length: 11.4 cm. Echogenicity and renal cortical thickness are within normal limits. No mass, perinephric fluid, or hydronephrosis visualized. No sonographically demonstrable calculus or ureterectasis. Bladder: The urinary bladder  is empty and cannot be assessed currently. IMPRESSION: Small cyst arising from upper pole right kidney. Kidneys otherwise appear unremarkable. Electronically Signed   By: Lowella Grip III M.D.   On: 12/22/2016 09:19   Nm Myocar Multi W/spect W/wall Motion / Ef  Result Date: 12/23/2016  No diagnostic ST segment changes to indicate ischemia.  Small, mild intensity, basal inferoseptal defect that is fixed and most consistent  with soft tissue attenuation. Of note, the TID ratio was significantly increased at 1.42 raising the possibility of subendocardial or balanced ischemia.  This is an intermediate risk study.  Nuclear stress EF: 63%.        Discharge Exam: Vitals:   12/24/16 0946 12/24/16 1104  BP: 108/62 102/61  Pulse: 72 60  Resp: 10 16  Temp:  97.9 F (36.6 C)  SpO2: 92% 92%   Vitals:   12/24/16 0936 12/24/16 0941 12/24/16 0946 12/24/16 1104  BP: 110/67 105/64 108/62 102/61  Pulse: 84 75 72 60  Resp: '10 11 10 16  '$ Temp:    97.9 F (36.6 C)  TempSrc:    Oral  SpO2: 92% 90% 92% 92%  Weight:      Height:        General: Pt is alert, awake, not in acute distress Cardiovascular: RRR, S1/S2 +, no rubs, no gallops Respiratory: CTA bilaterally, no wheezing, no rhonchi Abdominal: Soft, NT, ND, bowel sounds + Extremities: no edema, no cyanosis    The results of significant diagnostics from this hospitalization (including imaging, microbiology, ancillary and laboratory) are listed below for reference.     Microbiology: No results found for this or any previous visit (from the past 240 hour(s)).   Labs: BNP (last 3 results) No results for input(s): BNP in the last 8760 hours. Basic Metabolic Panel:  Recent Labs Lab 12/21/16 2130 12/21/16 2132 12/23/16 0557 12/24/16 0818  NA 135  --  135 134*  K 2.8*  --  3.3* 4.5  CL 93*  --  100* 104  CO2 29  --  29 22  GLUCOSE 103*  --  107* 149*  BUN 12  --  12 10  CREATININE 1.43*  --  1.08* 0.78  CALCIUM 9.5  --  9.0  9.3  MG  --  2.0  --   --   PHOS  --  3.7  --   --    Liver Function Tests:  Recent Labs Lab 12/23/16 0557  AST 19  ALT 9*  ALKPHOS 72  BILITOT 0.4  PROT 5.9*  ALBUMIN 3.0*   No results for input(s): LIPASE, AMYLASE in the last 168 hours. No results for input(s): AMMONIA in the last 168 hours. CBC:  Recent Labs Lab 12/21/16 2130 12/23/16 0557  WBC 7.2 5.5  HGB 14.2 11.8*  HCT 44.9 38.0  MCV 85.2 87.8  PLT 349 275   Cardiac Enzymes:  Recent Labs Lab 12/22/16 0127 12/22/16 0809  TROPONINI <0.03 <0.03   BNP: Invalid input(s): POCBNP CBG:  Recent Labs Lab 12/23/16 0748 12/23/16 1652 12/23/16 2005 12/24/16 0754 12/24/16 1104  GLUCAP 103* 103* 137* 160* 139*   D-Dimer  Recent Labs  12/22/16 1130  DDIMER <0.27   Hgb A1c No results for input(s): HGBA1C in the last 72 hours. Lipid Profile No results for input(s): CHOL, HDL, LDLCALC, TRIG, CHOLHDL, LDLDIRECT in the last 72 hours. Thyroid function studies No results for input(s): TSH, T4TOTAL, T3FREE, THYROIDAB in the last 72 hours.  Invalid input(s): FREET3 Anemia work up No results for input(s): VITAMINB12, FOLATE, FERRITIN, TIBC, IRON, RETICCTPCT in the last 72 hours. Urinalysis    Component Value Date/Time   COLORURINE YELLOW 12/22/2016 0214   APPEARANCEUR CLOUDY (A) 12/22/2016 0214   LABSPEC 1.017 12/22/2016 0214   PHURINE 5.0 12/22/2016 0214   GLUCOSEU NEGATIVE 12/22/2016 0214   HGBUR SMALL (A) 12/22/2016 0214   BILIRUBINUR NEGATIVE 12/22/2016 0214   KETONESUR NEGATIVE 12/22/2016  0214   PROTEINUR 30 (A) 12/22/2016 0214   UROBILINOGEN 0.2 12/30/2012 1300   NITRITE NEGATIVE 12/22/2016 0214   LEUKOCYTESUR LARGE (A) 12/22/2016 0214   Sepsis Labs Invalid input(s): PROCALCITONIN,  WBC,  LACTICIDVEN Microbiology No results found for this or any previous visit (from the past 240 hour(s)).   Time coordinating discharge: Over 30 minutes  SIGNED:   Debbe Odea, MD  [facility] 12/24/2016, 2:12 PM Pager   If 7PM-7AM, please contact night-coverage [email or web address] Password [email or web address]

## 2016-12-24 NOTE — Interval H&P Note (Signed)
Cath Lab Visit (complete for each Cath Lab visit)  Clinical Evaluation Leading to the Procedure:   ACS: No.  Non-ACS:    Anginal Classification: CCS III  Anti-ischemic medical therapy: Minimal Therapy (1 class of medications)  Non-Invasive Test Results: Intermediate-risk stress test findings: cardiac mortality 1-3%/year  Prior CABG: No previous CABG      History and Physical Interval Note:  12/24/2016 9:06 AM  Yesenia [name]  has presented today for surgery, with the diagnosis of abnormal nuclear stress test  The various methods of treatment have been discussed with the patient and family. After consideration of risks, benefits and other options for treatment, the patient has consented to  Procedure(s): LEFT HEART CATH AND CORONARY ANGIOGRAPHY (N/A) as a surgical intervention .  The patient's history has been reviewed, patient examined, no change in status, stable for surgery.  I have reviewed the patient's chart and labs.  Questions were answered to the patient's satisfaction.     Shelva Majestic

## 2016-12-24 NOTE — Plan of Care (Signed)
Problem: Activity: Goal: Risk for activity intolerance will decrease Outcome: Progressing Patient to and from bathroom twice this shift.  No shortness of breath noted per RN with ambulation.  Patient had no complaints of pain with activity.

## 2016-12-24 NOTE — Plan of Care (Signed)
Problem: Education: Goal: Knowledge of [facility] Education information/materials will improve Outcome: Progressing Patient aware of plan of care.  Patient denying pain.  RN instructed patient to notify RN immediately if patient started to experience any pain.  Patient agreeable to RN instruction.

## 2016-12-25 ENCOUNTER — Inpatient Hospital Stay
Admission: RE | Admit: 2016-12-25 | Discharge: 2016-12-25 | Disposition: A | Discharge status: Home / Self Care | Payer: Self-pay | Source: Ambulatory Visit | Attending: Neurology | Admitting: Neurology

## 2016-12-25 NOTE — Discharge Instructions (Signed)

## 2017-01-11 ENCOUNTER — Ambulatory Visit (INDEPENDENT_AMBULATORY_CARE_PROVIDER_SITE_OTHER): Payer: Medicare Other | Admitting: Family Medicine

## 2017-01-11 ENCOUNTER — Encounter: Payer: Self-pay | Admitting: Family Medicine

## 2017-01-11 VITALS — BP 118/80 | HR 84 | Temp 98.3°F | Resp 16 | Ht 61.0 in | Wt 243.0 lb

## 2017-01-11 DIAGNOSIS — E1149 Type 2 diabetes mellitus with other diabetic neurological complication: Secondary | ICD-10-CM | POA: Diagnosis not present

## 2017-01-11 DIAGNOSIS — I10 Essential (primary) hypertension: Secondary | ICD-10-CM | POA: Diagnosis not present

## 2017-01-11 DIAGNOSIS — E038 Other specified hypothyroidism: Secondary | ICD-10-CM | POA: Diagnosis not present

## 2017-01-11 DIAGNOSIS — N179 Acute kidney failure, unspecified: Secondary | ICD-10-CM | POA: Diagnosis not present

## 2017-01-11 MED ORDER — FUROSEMIDE 40 MG PO TABS: 40.0000 mg | ORAL_TABLET | Freq: Every day | ORAL | 3 refills | Status: DC | PRN

## 2017-01-11 MED ORDER — SEMAGLUTIDE(0.25 OR 0.5MG/DOS) 2 MG/1.5ML ~~LOC~~ SOPN: 0.2500 mg | PEN_INJECTOR | SUBCUTANEOUS | 2 refills | Status: DC

## 2017-01-11 NOTE — Assessment & Plan Note (Addendum)
Recheck A1c in May to decrease her Victoza to 1.2 mg we'll see if her insurance will cover the weekly injection  She had GI upset with aspirin number for advised that she can try 81 mg coated aspirin and see how she does

## 2017-01-11 NOTE — Assessment & Plan Note (Signed)
Blood pressure controlled for right now she would discontinue the hydrochlorothiazide will use the Lasix only as needed for leg swelling

## 2017-01-11 NOTE — Progress Notes (Signed)
   Subjective:    Patient ID: Yesenia [name], female    DOB: Oct 07, 1950, 66 y.o.   MRN: [id number]  Patient presents for Hospitalization Follow-up   Pt here for Hospital follow up , was admited due to Chest pain. She had cardiac catheterization there was no blockages found. Maximizing medical therapy as she has significant comorbidities including diabetes hyperlipidemia. Ago she also had acute kidney injury she was taking her regular hydrochlorothiazide and which she had some ankle swelling she begin taking her Lasix on top of that. She had hypokalemia as well. Both medications were stopped and she was told to not restart until she follow-up with me. She's not had any ankle swelling shortness of breath or chest pain. Her blood pressure has also been staying down.  Diabetes mellitus last A1c was 6.1% in May she is currently on Victoza but this is causing nausea she is on 1.8 mg daily.   Pain clinic- tomorrow for her Percocet    She felt like the Mrybetriq caused her to not  Empty her bladder completely therefore she discontinued it and her symptoms improved.  She is interested in getting the new shingles vaccine also asked about aspirin  Review Of Systems:  GEN- denies fatigue, fever, weight loss,weakness, recent illness HEENT- denies eye drainage, change in vision, nasal discharge, CVS- denies chest pain, palpitations RESP- denies SOB, cough, wheeze ABD- denies N/V, change in stools, abd pain GU- denies dysuria, hematuria, dribbling, incontinence MSK- + joint pain, muscle aches, injury Neuro- denies headache, dizziness, syncope, seizure activity       Objective:    BP 118/80   Pulse 84   Temp 98.3 F (36.8 C) (Oral)   Resp 16   Ht 5\' 1"  (1.549 m)   Wt 243 lb (110.2 kg)   BMI 45.91 kg/m  GEN- NAD, alert and oriented x3 HEENT- PERRL, EOMI, non injected sclera, pink conjunctiva, MMM, oropharynx clear Neck- Supple, no thyromegaly CVS- RRR, 3/6  SEM RESP-CTAB ABD-NABS,soft,NT,ND EXT- No edema Pulses- Radial, DP- 2+        Assessment & Plan:      Problem List Items Addressed This Visit      Unprioritized   Morbid obesity ([place])   Type II diabetes mellitus with neurological manifestations ([place])    Recheck A1c in May to decrease her Victoza to 1.2 mg we'll see if her insurance will cover the weekly injection  She had GI upset with aspirin number for advised that she can try 81 mg coated aspirin and see how she does      Relevant Orders   Hemoglobin A1c   Hypothyroidism    Recheck TFT      Relevant Orders   TSH   T3, free   T4, free   Essential hypertension, benign - Primary    Blood pressure controlled for right now she would discontinue the hydrochlorothiazide will use the Lasix only as needed for leg swelling      Relevant Medications   furosemide (LASIX) 40 MG tablet   Other Relevant Orders   CBC with Differential/Platelet   Comprehensive metabolic panel   AKI (acute kidney injury) ([place])    Recheck renal function            Note: This dictation was prepared with Dragon dictation along with smaller phrase technology. Any transcriptional errors that result from this process are unintentional.

## 2017-01-11 NOTE — Assessment & Plan Note (Signed)
Recheck TFT 

## 2017-01-11 NOTE — Assessment & Plan Note (Signed)
Recheck renal function. ?

## 2017-01-11 NOTE — Patient Instructions (Addendum)
Decrease Victoza to 1.2mg  daily  I will check on the weekly dosing Stop the HCTZ Stop daily claritin Lasix on as needed  Shingrix We will call with lab results  Okay to take coated baby aspirin F/U 4 months

## 2017-01-12 ENCOUNTER — Encounter: Payer: Self-pay | Admitting: *Deleted

## 2017-01-12 DIAGNOSIS — M25519 Pain in unspecified shoulder: Secondary | ICD-10-CM | POA: Diagnosis not present

## 2017-01-12 DIAGNOSIS — Z79891 Long term (current) use of opiate analgesic: Secondary | ICD-10-CM | POA: Diagnosis not present

## 2017-01-12 DIAGNOSIS — M4601 Spinal enthesopathy, occipito-atlanto-axial region: Secondary | ICD-10-CM | POA: Diagnosis not present

## 2017-01-12 DIAGNOSIS — M545 Low back pain: Secondary | ICD-10-CM | POA: Diagnosis not present

## 2017-01-12 LAB — CBC WITH DIFFERENTIAL/PLATELET
BASOS PCT: 0.8 %
Basophils Absolute: 51 cells/uL (ref 0–200)
EOS PCT: 5.3 %
Eosinophils Absolute: 339 cells/uL (ref 15–500)
HEMATOCRIT: 40.4 % (ref 35.0–45.0)
Hemoglobin: 13.2 g/dL (ref 11.7–15.5)
Lymphs Abs: 2003 cells/uL (ref 850–3900)
MCH: 27 pg (ref 27.0–33.0)
MCHC: 32.7 g/dL (ref 32.0–36.0)
MCV: 82.8 fL (ref 80.0–100.0)
MONOS PCT: 10.6 %
MPV: 11.3 fL (ref 7.5–12.5)
Neutro Abs: 3328 cells/uL (ref 1500–7800)
Neutrophils Relative %: 52 %
PLATELETS: 258 10*3/uL (ref 140–400)
RBC: 4.88 10*6/uL (ref 3.80–5.10)
RDW: 14.5 % (ref 11.0–15.0)
TOTAL LYMPHOCYTE: 31.3 %
WBC mixed population: 678 cells/uL (ref 200–950)
WBC: 6.4 10*3/uL (ref 3.8–10.8)

## 2017-01-12 LAB — COMPREHENSIVE METABOLIC PANEL
AG Ratio: 1.3 (calc) (ref 1.0–2.5)
ALKALINE PHOSPHATASE (APISO): 84 U/L (ref 33–130)
ALT: 5 U/L — ABNORMAL LOW (ref 6–29)
AST: 14 U/L (ref 10–35)
Albumin: 3.7 g/dL (ref 3.6–5.1)
BILIRUBIN TOTAL: 0.3 mg/dL (ref 0.2–1.2)
BUN/Creatinine Ratio: 15 (calc) (ref 6–22)
BUN: 16 mg/dL (ref 7–25)
CALCIUM: 9.5 mg/dL (ref 8.6–10.4)
CHLORIDE: 103 mmol/L (ref 98–110)
CO2: 29 mmol/L (ref 20–32)
CREATININE: 1.05 mg/dL — AB (ref 0.50–0.99)
GLOBULIN: 2.8 g/dL (ref 1.9–3.7)
Glucose, Bld: 80 mg/dL (ref 65–99)
Potassium: 3.5 mmol/L (ref 3.5–5.3)
Sodium: 140 mmol/L (ref 135–146)
Total Protein: 6.5 g/dL (ref 6.1–8.1)

## 2017-01-12 LAB — T3, FREE: T3 FREE: 2.8 pg/mL (ref 2.3–4.2)

## 2017-01-12 LAB — T4, FREE: Free T4: 1.2 ng/dL (ref 0.8–1.8)

## 2017-01-12 LAB — HEMOGLOBIN A1C
HEMOGLOBIN A1C: 5.7 %{Hb} — AB (ref <5.7)
MEAN PLASMA GLUCOSE: 117 (calc)
eAG (mmol/L): 6.5 (calc)

## 2017-01-12 LAB — TSH: TSH: 0.7 mIU/L (ref 0.40–4.50)

## 2017-01-15 ENCOUNTER — Other Ambulatory Visit: Payer: Self-pay

## 2017-01-20 ENCOUNTER — Other Ambulatory Visit: Payer: Self-pay | Admitting: Family Medicine

## 2017-02-10 DIAGNOSIS — M461 Sacroiliitis, not elsewhere classified: Secondary | ICD-10-CM | POA: Diagnosis not present

## 2017-02-10 DIAGNOSIS — Z79891 Long term (current) use of opiate analgesic: Secondary | ICD-10-CM | POA: Diagnosis not present

## 2017-02-10 DIAGNOSIS — M25519 Pain in unspecified shoulder: Secondary | ICD-10-CM | POA: Diagnosis not present

## 2017-02-10 DIAGNOSIS — M797 Fibromyalgia: Secondary | ICD-10-CM | POA: Diagnosis not present

## 2017-02-10 DIAGNOSIS — M545 Low back pain: Secondary | ICD-10-CM | POA: Diagnosis not present

## 2017-02-16 ENCOUNTER — Other Ambulatory Visit: Payer: Self-pay | Admitting: Family Medicine

## 2017-03-02 ENCOUNTER — Other Ambulatory Visit: Payer: Self-pay | Admitting: Family Medicine

## 2017-03-11 ENCOUNTER — Other Ambulatory Visit: Payer: Self-pay | Admitting: Family Medicine

## 2017-03-11 DIAGNOSIS — M797 Fibromyalgia: Secondary | ICD-10-CM | POA: Diagnosis not present

## 2017-03-11 DIAGNOSIS — M25519 Pain in unspecified shoulder: Secondary | ICD-10-CM | POA: Diagnosis not present

## 2017-03-11 DIAGNOSIS — Z79891 Long term (current) use of opiate analgesic: Secondary | ICD-10-CM | POA: Diagnosis not present

## 2017-03-11 DIAGNOSIS — M545 Low back pain: Secondary | ICD-10-CM | POA: Diagnosis not present

## 2017-03-23 ENCOUNTER — Encounter: Payer: Self-pay | Admitting: Family Medicine

## 2017-04-07 ENCOUNTER — Encounter: Payer: Self-pay | Admitting: Family Medicine

## 2017-05-14 ENCOUNTER — Other Ambulatory Visit: Payer: Self-pay

## 2017-05-14 ENCOUNTER — Encounter: Payer: Self-pay | Admitting: *Deleted

## 2017-05-14 ENCOUNTER — Encounter: Payer: Self-pay | Admitting: Family Medicine

## 2017-05-14 ENCOUNTER — Ambulatory Visit (INDEPENDENT_AMBULATORY_CARE_PROVIDER_SITE_OTHER): Payer: Medicare Other | Admitting: Family Medicine

## 2017-05-14 VITALS — BP 122/72 | HR 68 | Temp 97.9°F | Resp 16 | Ht 61.0 in | Wt 249.0 lb

## 2017-05-14 DIAGNOSIS — Z23 Encounter for immunization: Secondary | ICD-10-CM | POA: Diagnosis not present

## 2017-05-14 DIAGNOSIS — E1149 Type 2 diabetes mellitus with other diabetic neurological complication: Secondary | ICD-10-CM | POA: Diagnosis not present

## 2017-05-14 DIAGNOSIS — I10 Essential (primary) hypertension: Secondary | ICD-10-CM

## 2017-05-14 DIAGNOSIS — E038 Other specified hypothyroidism: Secondary | ICD-10-CM

## 2017-05-14 DIAGNOSIS — N182 Chronic kidney disease, stage 2 (mild): Secondary | ICD-10-CM

## 2017-05-14 DIAGNOSIS — M48061 Spinal stenosis, lumbar region without neurogenic claudication: Secondary | ICD-10-CM | POA: Diagnosis not present

## 2017-05-14 MED ORDER — PREGABALIN 150 MG PO CAPS: 150.0000 mg | ORAL_CAPSULE | Freq: Three times a day (TID) | ORAL | 0 refills | Status: DC

## 2017-05-14 NOTE — Assessment & Plan Note (Signed)
TFT at goal 

## 2017-05-14 NOTE — Assessment & Plan Note (Signed)
Obtain records from Pain clinic to update her medication list

## 2017-05-14 NOTE — Patient Instructions (Signed)
F/U 4 months  Continue current medications   

## 2017-05-14 NOTE — Progress Notes (Signed)
   Subjective:    Patient ID: Yesenia [name], female    DOB: 08/23/1950, 67 y.o.   MRN: [id number]  Patient presents for Follow-up (is not fasting)  Pt here to f/u chronic medical problems  Currently in pain clinic- she was taken off CYmbalta and given hydroxyzine she thinks 75mg  daily    Lyrica is now 150mg  three times a day  Not sure which pain medication is normal  HTN- taking lasix as needed     DM- last A1C 5.7%, in Oct, CBG range  101-118, taking Victoza 1.8mg  daily , weight up 6lbs since October, states she is snacking a lot   Needs shingrix #2 today  No concerns today      Review Of Systems:  GEN- denies fatigue, fever, weight loss,weakness, recent illness HEENT- denies eye drainage, change in vision, nasal discharge, CVS- denies chest pain, palpitations RESP- denies SOB, cough, wheeze ABD- denies N/V, change in stools, abd pain GU- denies dysuria, hematuria, dribbling, incontinence MSK- denies joint pain, muscle aches, injury Neuro- denies headache, dizziness, syncope, seizure activity       Objective:    BP 122/72   Pulse 68   Temp 97.9 F (36.6 C) (Oral)   Resp 16   Ht 5\' 1"  (1.549 m)   Wt 249 lb (112.9 kg)   SpO2 96%   BMI 47.05 kg/m  GEN- NAD, alert and oriented x3 HEENT- PERRL, EOMI, non injected sclera, pink conjunctiva, MMM, oropharynx clear Neck- Supple, no thyromegaly CVS- RRR, no murmur RESP-CTAB ABD-NABS,soft,NT,ND EXT- No edema Pulses- Radial, DP- 2+        Assessment & Plan:      Problem List Items Addressed This Visit      Unprioritized   Morbid obesity ([place])   CKD (chronic kidney disease), stage II   Type II diabetes mellitus with neurological manifestations ([place]) - Primary    Has been well controlled Check A1C and lipids Continue victoza Discussed her snacking, sugar intake and weight gain Goal A1C < 7%      Relevant Orders   CBC with Differential/Platelet   Comprehensive metabolic panel   Hemoglobin A1c   Lipid  panel   SPINAL STENOSIS, LUMBAR    Obtain records from Pain clinic to update her medication list       Hypothyroidism    TFT at goal       Essential hypertension, benign    Controlled using lasix prn       Relevant Orders   Lipid panel    Other Visit Diagnoses    Need for shingles vaccine       Relevant Orders   Varicella-zoster vaccine IM (Shingrix) (Completed)      Note: This dictation was prepared with Dragon dictation along with smaller phrase technology. Any transcriptional errors that result from this process are unintentional.

## 2017-05-14 NOTE — Assessment & Plan Note (Addendum)
Has been well controlled Check A1C and lipids Continue victoza Discussed her snacking, sugar intake and weight gain Goal A1C < 7%

## 2017-05-14 NOTE — Assessment & Plan Note (Addendum)
Controlled using lasix prn

## 2017-05-15 LAB — CBC WITH DIFFERENTIAL/PLATELET
BASOS PCT: 0.6 %
Basophils Absolute: 32 cells/uL (ref 0–200)
EOS ABS: 302 {cells}/uL (ref 15–500)
Eosinophils Relative: 5.7 %
HCT: 40.9 % (ref 35.0–45.0)
Hemoglobin: 12.7 g/dL (ref 11.7–15.5)
Lymphs Abs: 1638 cells/uL (ref 850–3900)
MCH: 25.1 pg — ABNORMAL LOW (ref 27.0–33.0)
MCHC: 31.1 g/dL — ABNORMAL LOW (ref 32.0–36.0)
MCV: 80.8 fL (ref 80.0–100.0)
MPV: 11.2 fL (ref 7.5–12.5)
Monocytes Relative: 7.8 %
NEUTROS PCT: 55 %
Neutro Abs: 2915 cells/uL (ref 1500–7800)
PLATELETS: 255 10*3/uL (ref 140–400)
RBC: 5.06 10*6/uL (ref 3.80–5.10)
RDW: 14.4 % (ref 11.0–15.0)
TOTAL LYMPHOCYTE: 30.9 %
WBC: 5.3 10*3/uL (ref 3.8–10.8)
WBCMIX: 413 {cells}/uL (ref 200–950)

## 2017-05-15 LAB — COMPREHENSIVE METABOLIC PANEL
AG Ratio: 1.4 (calc) (ref 1.0–2.5)
ALT: 7 U/L (ref 6–29)
AST: 15 U/L (ref 10–35)
Albumin: 3.9 g/dL (ref 3.6–5.1)
Alkaline phosphatase (APISO): 71 U/L (ref 33–130)
BUN: 14 mg/dL (ref 7–25)
CHLORIDE: 104 mmol/L (ref 98–110)
CO2: 28 mmol/L (ref 20–32)
CREATININE: 0.85 mg/dL (ref 0.50–0.99)
Calcium: 9.7 mg/dL (ref 8.6–10.4)
GLOBULIN: 2.8 g/dL (ref 1.9–3.7)
Glucose, Bld: 106 mg/dL — ABNORMAL HIGH (ref 65–99)
Potassium: 4.2 mmol/L (ref 3.5–5.3)
Sodium: 140 mmol/L (ref 135–146)
Total Bilirubin: 0.3 mg/dL (ref 0.2–1.2)
Total Protein: 6.7 g/dL (ref 6.1–8.1)

## 2017-05-15 LAB — LIPID PANEL
CHOL/HDL RATIO: 2.5 (calc) (ref <5.0)
CHOLESTEROL: 150 mg/dL (ref <200)
HDL: 60 mg/dL (ref >50)
LDL Cholesterol (Calc): 72 mg/dL (calc)
Non-HDL Cholesterol (Calc): 90 mg/dL (calc) (ref <130)
Triglycerides: 98 mg/dL (ref <150)

## 2017-05-15 LAB — HEMOGLOBIN A1C
HEMOGLOBIN A1C: 6 %{Hb} — AB (ref <5.7)
Mean Plasma Glucose: 126 (calc)
eAG (mmol/L): 7 (calc)

## 2017-05-18 ENCOUNTER — Encounter: Payer: Self-pay | Admitting: *Deleted

## 2017-05-20 ENCOUNTER — Encounter: Payer: Self-pay | Admitting: *Deleted

## 2017-05-25 DIAGNOSIS — M545 Low back pain: Secondary | ICD-10-CM | POA: Diagnosis not present

## 2017-05-25 DIAGNOSIS — M797 Fibromyalgia: Secondary | ICD-10-CM | POA: Diagnosis not present

## 2017-05-25 DIAGNOSIS — M461 Sacroiliitis, not elsewhere classified: Secondary | ICD-10-CM | POA: Diagnosis not present

## 2017-05-25 DIAGNOSIS — M25519 Pain in unspecified shoulder: Secondary | ICD-10-CM | POA: Diagnosis not present

## 2017-07-03 ENCOUNTER — Other Ambulatory Visit: Payer: Self-pay | Admitting: Family Medicine

## 2017-07-21 ENCOUNTER — Other Ambulatory Visit: Payer: Self-pay | Admitting: Family Medicine

## 2017-07-21 DIAGNOSIS — M25519 Pain in unspecified shoulder: Secondary | ICD-10-CM | POA: Diagnosis not present

## 2017-07-21 DIAGNOSIS — M545 Low back pain: Secondary | ICD-10-CM | POA: Diagnosis not present

## 2017-07-21 DIAGNOSIS — M461 Sacroiliitis, not elsewhere classified: Secondary | ICD-10-CM | POA: Diagnosis not present

## 2017-07-21 DIAGNOSIS — M797 Fibromyalgia: Secondary | ICD-10-CM | POA: Diagnosis not present

## 2017-07-26 ENCOUNTER — Other Ambulatory Visit: Payer: Self-pay

## 2017-07-26 NOTE — Patient Outreach (Signed)
[facility]) Care Management  07/26/2017  Yesenia [name] 1950/09/11 [id number]   Medication Adherence call to Yesenia [name] patient is showing past due under [place] spoke with patient she said she order this medication this morning and [name] will deliver tomorrow she will like to keep a 30 days supply becuase [name] pre-pack all her medication every month.    [facility] Management Direct Dial [phone]  Fax [phone] [email or web address]

## 2017-07-28 ENCOUNTER — Telehealth: Payer: Self-pay | Admitting: *Deleted

## 2017-07-28 NOTE — Telephone Encounter (Signed)
Received call from patient.  Requested handicap accomodation letter for her apartment complex. States that she requires the clothes rods in her closet to be lowered since her ROM is decreased D/T B shoulder fracture S/P ORIF in 2017.  MD please advise.

## 2017-07-30 ENCOUNTER — Encounter: Payer: Self-pay | Admitting: *Deleted

## 2017-07-30 NOTE — Telephone Encounter (Signed)
Letter transcribed and mailed to patient.

## 2017-07-30 NOTE — Telephone Encounter (Signed)
Is there a form or letter head this needs to go to? Okay to write letter for lowering her clothes rod

## 2017-08-30 ENCOUNTER — Other Ambulatory Visit: Payer: Self-pay | Admitting: Family Medicine

## 2017-09-13 ENCOUNTER — Encounter: Payer: Self-pay | Admitting: Family Medicine

## 2017-09-13 ENCOUNTER — Ambulatory Visit (INDEPENDENT_AMBULATORY_CARE_PROVIDER_SITE_OTHER): Payer: Medicare Other | Admitting: Family Medicine

## 2017-09-13 ENCOUNTER — Telehealth: Payer: Self-pay | Admitting: *Deleted

## 2017-09-13 ENCOUNTER — Other Ambulatory Visit: Payer: Self-pay

## 2017-09-13 VITALS — BP 128/62 | HR 70 | Temp 98.4°F | Resp 14 | Ht 61.0 in | Wt 264.0 lb

## 2017-09-13 DIAGNOSIS — N182 Chronic kidney disease, stage 2 (mild): Secondary | ICD-10-CM | POA: Diagnosis not present

## 2017-09-13 DIAGNOSIS — D509 Iron deficiency anemia, unspecified: Secondary | ICD-10-CM | POA: Diagnosis not present

## 2017-09-13 DIAGNOSIS — R609 Edema, unspecified: Secondary | ICD-10-CM | POA: Diagnosis not present

## 2017-09-13 DIAGNOSIS — E038 Other specified hypothyroidism: Secondary | ICD-10-CM

## 2017-09-13 DIAGNOSIS — Z1159 Encounter for screening for other viral diseases: Secondary | ICD-10-CM

## 2017-09-13 DIAGNOSIS — R0602 Shortness of breath: Secondary | ICD-10-CM | POA: Diagnosis not present

## 2017-09-13 DIAGNOSIS — I5189 Other ill-defined heart diseases: Secondary | ICD-10-CM

## 2017-09-13 DIAGNOSIS — Z23 Encounter for immunization: Secondary | ICD-10-CM | POA: Diagnosis not present

## 2017-09-13 DIAGNOSIS — E785 Hyperlipidemia, unspecified: Secondary | ICD-10-CM

## 2017-09-13 DIAGNOSIS — I10 Essential (primary) hypertension: Secondary | ICD-10-CM | POA: Diagnosis not present

## 2017-09-13 DIAGNOSIS — E1149 Type 2 diabetes mellitus with other diabetic neurological complication: Secondary | ICD-10-CM

## 2017-09-13 DIAGNOSIS — E1143 Type 2 diabetes mellitus with diabetic autonomic (poly)neuropathy: Secondary | ICD-10-CM | POA: Diagnosis not present

## 2017-09-13 DIAGNOSIS — R1310 Dysphagia, unspecified: Secondary | ICD-10-CM

## 2017-09-13 DIAGNOSIS — F331 Major depressive disorder, recurrent, moderate: Secondary | ICD-10-CM

## 2017-09-13 MED ORDER — FUROSEMIDE 40 MG PO TABS: 40.0000 mg | ORAL_TABLET | Freq: Two times a day (BID) | ORAL | 3 refills | Status: DC

## 2017-09-13 MED ORDER — LANCETS MISC: 1 refills | Status: DC

## 2017-09-13 MED ORDER — LANCET DEVICES MISC: 1 refills | Status: AC

## 2017-09-13 MED ORDER — GLUCOSE BLOOD VI STRP: ORAL_STRIP | 11 refills | Status: DC

## 2017-09-13 MED ORDER — BLOOD GLUCOSE SYSTEM PAK KIT: PACK | 1 refills | Status: DC

## 2017-09-13 NOTE — Assessment & Plan Note (Signed)
Recheck iron/CBC with symptoms

## 2017-09-13 NOTE — Assessment & Plan Note (Signed)
Controlled.  

## 2017-09-13 NOTE — Assessment & Plan Note (Signed)
Referral back to GI, had esophogeal stretching in past

## 2017-09-13 NOTE — Assessment & Plan Note (Signed)
Referral back to psychiatryat pt request No SI

## 2017-09-13 NOTE — Progress Notes (Signed)
Subjective:    Patient ID: Yesenia [name], female    DOB: 11/28/1950, 67 y.o.   MRN: [id number]  Patient presents for Follow-up (is fasting)   Pt here to f/u chronic medical problems   Medications reviewed  Hers meds are in pill pack   Due for her 2nd shingrix   DM- last A1C 6%, Victoza was reduced to 1.2mg  CBG range  states CBG have been good, running less than 120 fasting   Eye exam planning for Sept  Hyperlipidemia-lipids at goal , LDL 72 in Feb 2019 on statin    Hypothyroidism- taking synthroid 128mcg once a day , no difficulty with medication  Followed by pain clinic   Iron Def Anemia- history- feels cold all the time   Feels foods getting stuck , wants to see GI again, no vomiting, no abd pain  4 days of bilat leg swelling, restarted lasix 60mg  for 3 days, has been a little SOB. Has been off since last Sept  Wants to see psychiatry again , has stressors with family that are worsening, daughter having a baby with cardiac defect , no SI, still taking meds,   Review Of Systems:  GEN- denies fatigue, fever, weight loss,weakness, recent illness HEENT- denies eye drainage, change in vision, nasal discharge, CVS- denies chest pain, palpitations RESP- denies SOB, cough, wheeze ABD- denies N/V, change in stools, abd pain GU- denies dysuria, hematuria, dribbling, incontinence MSK- denies joint pain, muscle aches, injury Neuro- denies headache, dizziness, syncope, seizure activity       Objective:    BP 128/62   Pulse 70   Temp 98.4 F (36.9 C) (Oral)   Resp 14   Ht 5\' 1"  (1.549 m)   Wt 264 lb (119.7 kg)   SpO2 96%   BMI 49.88 kg/m  GEN- NAD, alert and oriented x3 HEENT- PERRL, EOMI, non injected sclera, pink conjunctiva, MMM, oropharynx clear Neck- Supple, no JVD CVS- RRR, 3/6 systolic  murmur RESP-CTAB ABD-NABS,soft,NT,ND Psych-normal affect and mood  PHQ-9 score 1  EXT- non pitting edema to above shins, swelling feet bilat L >R, neg homans Pulses-  Radial, DP- 2+        Assessment & Plan:      Problem List Items Addressed This Visit      Unprioritized   Anemia, iron deficiency    Recheck iron/CBC with symptoms      Relevant Orders   CBC with Differential/Platelet   Iron, TIBC and Ferritin Panel   CKD (chronic kidney disease), stage II   Diabetic neuropathy (HCC)   Diastolic dysfunction    Noted on echo in 2018 For peripheral edema Will take lasix 40mg  twice a day with potassium Compression hose Recheck in 1 week She has some mild SOB, normal sat, obtain BNP      Dysphagia    Referral back to GI, had esophogeal stretching in past       Essential hypertension, benign    Controlled       Relevant Medications   furosemide (LASIX) 40 MG tablet   Hyperlipemia   Relevant Medications   furosemide (LASIX) 40 MG tablet   Hypothyroidism   Relevant Orders   TSH   MDD (major depressive disorder)    Referral back to psychiatryat pt request No SI No change to meds      Relevant Orders   Ambulatory referral to Psychiatry   Type II diabetes mellitus with neurological manifestations ([place]) - Primary    Recheck A1C goal <  7% Continue victoza      Relevant Orders   Comprehensive metabolic panel   Hemoglobin A1c   Microalbumin / creatinine urine ratio   HM DIABETES FOOT EXAM (Completed)    Other Visit Diagnoses    Peripheral edema       SOB (shortness of breath)       Relevant Orders   Brain natriuretic peptide   Need for shingles vaccine       Relevant Orders   Varicella-zoster vaccine IM (Shingrix) (Completed)   Need for hepatitis C screening test       Relevant Orders   Hepatitis C antibody      Note: This dictation was prepared with Dragon dictation along with smaller phrase technology. Any transcriptional errors that result from this process are unintentional.

## 2017-09-13 NOTE — Telephone Encounter (Signed)
Patient in office and requested refill on DM testing supplies.   Prescription sent to pharmacy.

## 2017-09-13 NOTE — Assessment & Plan Note (Signed)
Referral back to psychiatryat pt request No SI No change to meds

## 2017-09-13 NOTE — Assessment & Plan Note (Signed)
Recheck A1C goal < 7% Continue victoza

## 2017-09-13 NOTE — Patient Instructions (Addendum)
F/U 4 months for physical with Physician Assistant Referral to psychiatry Referral to GI  Increase lasix to 40mg  twice a day  We will call with lab results  Compression hose sent to [facility]  F/U 1 week for recheck on swelling

## 2017-09-13 NOTE — Assessment & Plan Note (Addendum)
Noted on echo in 2018 For peripheral edema Will take lasix 40mg  twice a day with potassium Compression hose Recheck in 1 week She has some mild SOB, normal sat, obtain BNP

## 2017-09-14 LAB — BRAIN NATRIURETIC PEPTIDE: Brain Natriuretic Peptide: 58 pg/mL (ref <100)

## 2017-09-14 LAB — IRON,TIBC AND FERRITIN PANEL
%SAT: 9 % — AB (ref 11–50)
Ferritin: 7 ng/mL — ABNORMAL LOW (ref 20–288)
Iron: 35 ug/dL — ABNORMAL LOW (ref 45–160)
TIBC: 400 ug/dL (ref 250–450)

## 2017-09-14 LAB — CBC WITH DIFFERENTIAL/PLATELET
BASOS PCT: 1.2 %
Basophils Absolute: 58 cells/uL (ref 0–200)
Eosinophils Absolute: 336 cells/uL (ref 15–500)
Eosinophils Relative: 7 %
HCT: 36.7 % (ref 35.0–45.0)
Hemoglobin: 11.6 g/dL — ABNORMAL LOW (ref 11.7–15.5)
LYMPHS ABS: 1757 {cells}/uL (ref 850–3900)
MCH: 25.7 pg — ABNORMAL LOW (ref 27.0–33.0)
MCHC: 31.6 g/dL — ABNORMAL LOW (ref 32.0–36.0)
MCV: 81.2 fL (ref 80.0–100.0)
MPV: 11.4 fL (ref 7.5–12.5)
Monocytes Relative: 11.2 %
NEUTROS ABS: 2112 {cells}/uL (ref 1500–7800)
Neutrophils Relative %: 44 %
PLATELETS: 247 10*3/uL (ref 140–400)
RBC: 4.52 10*6/uL (ref 3.80–5.10)
RDW: 14.4 % (ref 11.0–15.0)
Total Lymphocyte: 36.6 %
WBC: 4.8 10*3/uL (ref 3.8–10.8)
WBCMIX: 538 {cells}/uL (ref 200–950)

## 2017-09-14 LAB — COMPREHENSIVE METABOLIC PANEL
AG Ratio: 1.4 (calc) (ref 1.0–2.5)
ALBUMIN MSPROF: 4 g/dL (ref 3.6–5.1)
ALKALINE PHOSPHATASE (APISO): 75 U/L (ref 33–130)
ALT: 10 U/L (ref 6–29)
AST: 20 U/L (ref 10–35)
BUN/Creatinine Ratio: 12 (calc) (ref 6–22)
BUN: 12 mg/dL (ref 7–25)
CO2: 26 mmol/L (ref 20–32)
CREATININE: 1.04 mg/dL — AB (ref 0.50–0.99)
Calcium: 10.1 mg/dL (ref 8.6–10.4)
Chloride: 103 mmol/L (ref 98–110)
Globulin: 2.8 g/dL (calc) (ref 1.9–3.7)
Glucose, Bld: 100 mg/dL — ABNORMAL HIGH (ref 65–99)
POTASSIUM: 4.2 mmol/L (ref 3.5–5.3)
SODIUM: 140 mmol/L (ref 135–146)
TOTAL PROTEIN: 6.8 g/dL (ref 6.1–8.1)
Total Bilirubin: 0.3 mg/dL (ref 0.2–1.2)

## 2017-09-14 LAB — HEMOGLOBIN A1C
HEMOGLOBIN A1C: 5.9 %{Hb} — AB (ref <5.7)
Mean Plasma Glucose: 123 (calc)
eAG (mmol/L): 6.8 (calc)

## 2017-09-14 LAB — MICROALBUMIN / CREATININE URINE RATIO
Creatinine, Urine: 109 mg/dL (ref 20–275)
Microalb Creat Ratio: 3 mcg/mg creat (ref <30)
Microalb, Ur: 0.3 mg/dL

## 2017-09-14 LAB — HEPATITIS C ANTIBODY
HEP C AB: NONREACTIVE
SIGNAL TO CUT-OFF: 0.02 (ref <1.00)

## 2017-09-14 LAB — TSH: TSH: 3.75 m[IU]/L (ref 0.40–4.50)

## 2017-09-15 ENCOUNTER — Encounter: Payer: Self-pay | Admitting: *Deleted

## 2017-09-21 ENCOUNTER — Ambulatory Visit: Payer: Medicare Other | Admitting: Family Medicine

## 2017-09-24 ENCOUNTER — Encounter: Payer: Self-pay | Admitting: Family Medicine

## 2017-10-11 ENCOUNTER — Other Ambulatory Visit: Payer: Self-pay

## 2017-10-11 DIAGNOSIS — M25519 Pain in unspecified shoulder: Secondary | ICD-10-CM | POA: Diagnosis not present

## 2017-10-11 DIAGNOSIS — M461 Sacroiliitis, not elsewhere classified: Secondary | ICD-10-CM | POA: Diagnosis not present

## 2017-10-11 DIAGNOSIS — M797 Fibromyalgia: Secondary | ICD-10-CM | POA: Diagnosis not present

## 2017-10-11 DIAGNOSIS — Z79891 Long term (current) use of opiate analgesic: Secondary | ICD-10-CM | POA: Diagnosis not present

## 2017-10-11 DIAGNOSIS — M545 Low back pain: Secondary | ICD-10-CM | POA: Diagnosis not present

## 2017-10-11 NOTE — Patient Outreach (Signed)
[facility]) Care Management  10/11/2017  Yesenia [name] 1951-03-31 [id number]   Medication Adherence call to Yesenia [name] left a message for patient to call back patient is due on Victoza.Yesenia [name] is showing past due under [facility] [place]. [place] said she has not pick up Victoza since May/2019.   [place] Management Direct Dial [phone]  Fax [phone] [email or web address]

## 2017-10-29 ENCOUNTER — Other Ambulatory Visit: Payer: Self-pay | Admitting: Family Medicine

## 2017-11-15 ENCOUNTER — Other Ambulatory Visit: Payer: Self-pay | Admitting: Family Medicine

## 2017-12-12 ENCOUNTER — Encounter (HOSPITAL_COMMUNITY): Payer: Self-pay | Admitting: *Deleted

## 2017-12-12 ENCOUNTER — Emergency Department (HOSPITAL_COMMUNITY)
Admission: EM | Admit: 2017-12-12 | Discharge: 2017-12-12 | Disposition: A | Discharge status: Home / Self Care | Payer: Medicare Other | Attending: Emergency Medicine | Admitting: Emergency Medicine

## 2017-12-12 ENCOUNTER — Emergency Department (HOSPITAL_COMMUNITY): Payer: Medicare Other

## 2017-12-12 ENCOUNTER — Other Ambulatory Visit: Payer: Self-pay

## 2017-12-12 DIAGNOSIS — I129 Hypertensive chronic kidney disease with stage 1 through stage 4 chronic kidney disease, or unspecified chronic kidney disease: Secondary | ICD-10-CM | POA: Diagnosis not present

## 2017-12-12 DIAGNOSIS — R52 Pain, unspecified: Secondary | ICD-10-CM | POA: Diagnosis not present

## 2017-12-12 DIAGNOSIS — Y9301 Activity, walking, marching and hiking: Secondary | ICD-10-CM | POA: Diagnosis not present

## 2017-12-12 DIAGNOSIS — S52602A Unspecified fracture of lower end of left ulna, initial encounter for closed fracture: Secondary | ICD-10-CM | POA: Diagnosis not present

## 2017-12-12 DIAGNOSIS — Y929 Unspecified place or not applicable: Secondary | ICD-10-CM | POA: Insufficient documentation

## 2017-12-12 DIAGNOSIS — S59912A Unspecified injury of left forearm, initial encounter: Secondary | ICD-10-CM | POA: Diagnosis present

## 2017-12-12 DIAGNOSIS — E1122 Type 2 diabetes mellitus with diabetic chronic kidney disease: Secondary | ICD-10-CM | POA: Diagnosis not present

## 2017-12-12 DIAGNOSIS — S52502A Unspecified fracture of the lower end of left radius, initial encounter for closed fracture: Secondary | ICD-10-CM | POA: Insufficient documentation

## 2017-12-12 DIAGNOSIS — W19XXXA Unspecified fall, initial encounter: Secondary | ICD-10-CM | POA: Diagnosis not present

## 2017-12-12 DIAGNOSIS — S52612A Displaced fracture of left ulna styloid process, initial encounter for closed fracture: Secondary | ICD-10-CM | POA: Diagnosis not present

## 2017-12-12 DIAGNOSIS — N182 Chronic kidney disease, stage 2 (mild): Secondary | ICD-10-CM | POA: Insufficient documentation

## 2017-12-12 DIAGNOSIS — W0110XA Fall on same level from slipping, tripping and stumbling with subsequent striking against unspecified object, initial encounter: Secondary | ICD-10-CM | POA: Insufficient documentation

## 2017-12-12 DIAGNOSIS — Y999 Unspecified external cause status: Secondary | ICD-10-CM | POA: Insufficient documentation

## 2017-12-12 DIAGNOSIS — E039 Hypothyroidism, unspecified: Secondary | ICD-10-CM | POA: Diagnosis not present

## 2017-12-12 DIAGNOSIS — S52572A Other intraarticular fracture of lower end of left radius, initial encounter for closed fracture: Secondary | ICD-10-CM | POA: Diagnosis not present

## 2017-12-12 MED ORDER — LIDOCAINE HCL (PF) 2 % IJ SOLN
INTRAMUSCULAR | Status: AC
  Administered 2017-12-12: 5 mL
  Filled 2017-12-12: qty 20

## 2017-12-12 MED ORDER — LIDOCAINE HCL (PF) 2 % IJ SOLN
5.0000 mL | Freq: Once | INTRAMUSCULAR | Status: AC
  Administered 2017-12-12: 5 mL

## 2017-12-12 MED ORDER — FENTANYL CITRATE (PF) 100 MCG/2ML IJ SOLN
INTRAMUSCULAR | Status: AC | PRN
  Administered 2017-12-12: 50 ug via INTRAVENOUS

## 2017-12-12 MED ORDER — FENTANYL CITRATE (PF) 100 MCG/2ML IJ SOLN
100.0000 ug | Freq: Once | INTRAMUSCULAR | Status: AC
  Administered 2017-12-12: 100 ug via INTRAVENOUS
  Filled 2017-12-12: qty 2

## 2017-12-12 MED ORDER — FENTANYL CITRATE (PF) 100 MCG/2ML IJ SOLN
50.0000 ug | Freq: Once | INTRAMUSCULAR | Status: DC
  Filled 2017-12-12: qty 2

## 2017-12-12 MED ORDER — ETOMIDATE 2 MG/ML IV SOLN
10.0000 mg | Freq: Once | INTRAVENOUS | Status: AC
  Administered 2017-12-12: 10 mg via INTRAVENOUS
  Filled 2017-12-12: qty 10

## 2017-12-12 MED ORDER — HYDROCODONE-ACETAMINOPHEN 5-325 MG PO TABS: 1.0000 | ORAL_TABLET | Freq: Four times a day (QID) | ORAL | 0 refills | Status: DC | PRN

## 2017-12-12 NOTE — ED Notes (Signed)
Son in room at bedside; pt alert and watching tv; no complaints at this time
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
## 2017-12-12 NOTE — ED Triage Notes (Signed)
Pt brought in by [facility] for c/o moving a mattress and fell on her left arm; pt has obvious deformity; splinted by ems

## 2017-12-13 ENCOUNTER — Telehealth: Payer: Self-pay | Admitting: Cardiovascular Disease

## 2017-12-13 DIAGNOSIS — M25531 Pain in right wrist: Secondary | ICD-10-CM | POA: Diagnosis not present

## 2017-12-13 MED FILL — Hydrocodone-Acetaminophen Tab 5-325 MG: ORAL | Qty: 6 | Status: AC

## 2017-12-13 NOTE — Telephone Encounter (Signed)
Normal coronary arteries by cath in 2018. She can proceed with surgery. Does not need to be seen by me.

## 2017-12-13 NOTE — Telephone Encounter (Signed)
Spoke with Claiborne Billings at [facility].  Informed her that patient last seen by Dr. Bronson Ing was January 2017.  She is asking if there is any way to get patient worked in as she has wrist fracture that needs to be repaired.  Message sent to provider.

## 2017-12-13 NOTE — Telephone Encounter (Signed)
   [facility] Pre-operative Risk Assessment    Request for surgical clearance:  1. What type of surgery is being performed? ORIF left wrist   2. When is this surgery scheduled? 12/16/2017   3. What type of clearance is required (medical clearance vs. Pharmacy clearance to hold med vs. Both)? Medical   4. Are there any medications that need to be held prior to surgery and how long? None specified    5. Practice name and name of physician performing surgery?Dr. Edmonia Lynch @ [facility]   6. What is your office phone number [phone] ext: [phone] for [place]    7.   What is your office fax number [phone]  8.   Anesthesia type (None, local, MAC, general) ?  Not specified  ** anesthesia requests most recent notes, labs, EKG or special studies to be sent ** patient last seen by Dr. Bronson Ing 04/2015   Yesenia [name] 12/13/2017, 9:54 AM  _________________________________________________________________   (provider comments below)

## 2017-12-13 NOTE — Telephone Encounter (Signed)
Noted.  Will fax this note back to [facility].

## 2017-12-16 DIAGNOSIS — S52571A Other intraarticular fracture of lower end of right radius, initial encounter for closed fracture: Secondary | ICD-10-CM | POA: Diagnosis not present

## 2017-12-16 DIAGNOSIS — S52502A Unspecified fracture of the lower end of left radius, initial encounter for closed fracture: Secondary | ICD-10-CM | POA: Diagnosis not present

## 2017-12-16 DIAGNOSIS — G8918 Other acute postprocedural pain: Secondary | ICD-10-CM | POA: Diagnosis not present

## 2017-12-21 ENCOUNTER — Other Ambulatory Visit: Payer: Self-pay | Admitting: Family Medicine

## 2018-01-05 DIAGNOSIS — S52502D Unspecified fracture of the lower end of left radius, subsequent encounter for closed fracture with routine healing: Secondary | ICD-10-CM | POA: Diagnosis not present

## 2018-01-11 DIAGNOSIS — M545 Low back pain: Secondary | ICD-10-CM | POA: Diagnosis not present

## 2018-01-11 DIAGNOSIS — M461 Sacroiliitis, not elsewhere classified: Secondary | ICD-10-CM | POA: Diagnosis not present

## 2018-01-11 DIAGNOSIS — M25519 Pain in unspecified shoulder: Secondary | ICD-10-CM | POA: Diagnosis not present

## 2018-01-11 DIAGNOSIS — M797 Fibromyalgia: Secondary | ICD-10-CM | POA: Diagnosis not present

## 2018-02-09 ENCOUNTER — Other Ambulatory Visit: Payer: Self-pay | Admitting: Family Medicine

## 2018-03-14 ENCOUNTER — Telehealth: Payer: Self-pay | Admitting: *Deleted

## 2018-03-14 NOTE — Telephone Encounter (Signed)
Received fax requesting alternative to Zantac.   MD please advise.

## 2018-03-14 NOTE — Telephone Encounter (Signed)
Call placed to patient. LMTRC.  

## 2018-03-14 NOTE — Telephone Encounter (Signed)
Change to Pepcid 20mg  at BID prn

## 2018-03-16 MED ORDER — FAMOTIDINE 20 MG PO TABS: 20.0000 mg | ORAL_TABLET | Freq: Two times a day (BID) | ORAL | 3 refills | Status: DC | PRN

## 2018-03-16 NOTE — Telephone Encounter (Signed)
Multiple calls placed to patient with no answer and no return call.   Message to be closed.   Prescription sent to pharmacy.

## 2018-03-31 ENCOUNTER — Other Ambulatory Visit: Payer: Self-pay | Admitting: Family Medicine

## 2018-07-01 ENCOUNTER — Other Ambulatory Visit: Payer: Self-pay | Admitting: Family Medicine

## 2018-07-14 ENCOUNTER — Other Ambulatory Visit: Payer: Self-pay | Admitting: Family Medicine

## 2018-07-18 ENCOUNTER — Other Ambulatory Visit: Payer: Self-pay | Admitting: *Deleted

## 2018-09-09 ENCOUNTER — Other Ambulatory Visit: Payer: Self-pay | Admitting: Family Medicine

## 2018-09-09 NOTE — Telephone Encounter (Signed)
Last office visit: 09/13/2017 Lat refilled: 05/14/2017

## 2018-09-16 DIAGNOSIS — H2513 Age-related nuclear cataract, bilateral: Secondary | ICD-10-CM | POA: Diagnosis not present

## 2018-09-16 LAB — HM DIABETES EYE EXAM

## 2018-09-30 ENCOUNTER — Other Ambulatory Visit: Payer: Self-pay | Admitting: Family Medicine

## 2018-10-07 ENCOUNTER — Other Ambulatory Visit: Payer: Self-pay | Admitting: Family Medicine

## 2018-10-10 ENCOUNTER — Other Ambulatory Visit: Payer: Self-pay | Admitting: Family Medicine

## 2018-10-16 IMAGING — DX DG CHEST 2V
2 series · 2 of 2 positions shown · non-contrast
Comparison: 08/03/2016

CLINICAL DATA: Mid upper and right chest pain and shortness of
breath for 2 hours. History of diabetes, hypertension, pulmonary
embolus, pneumonia, cardiac catheterization, loop recorder.

EXAM:
CHEST  2 VIEW

[chest lat]
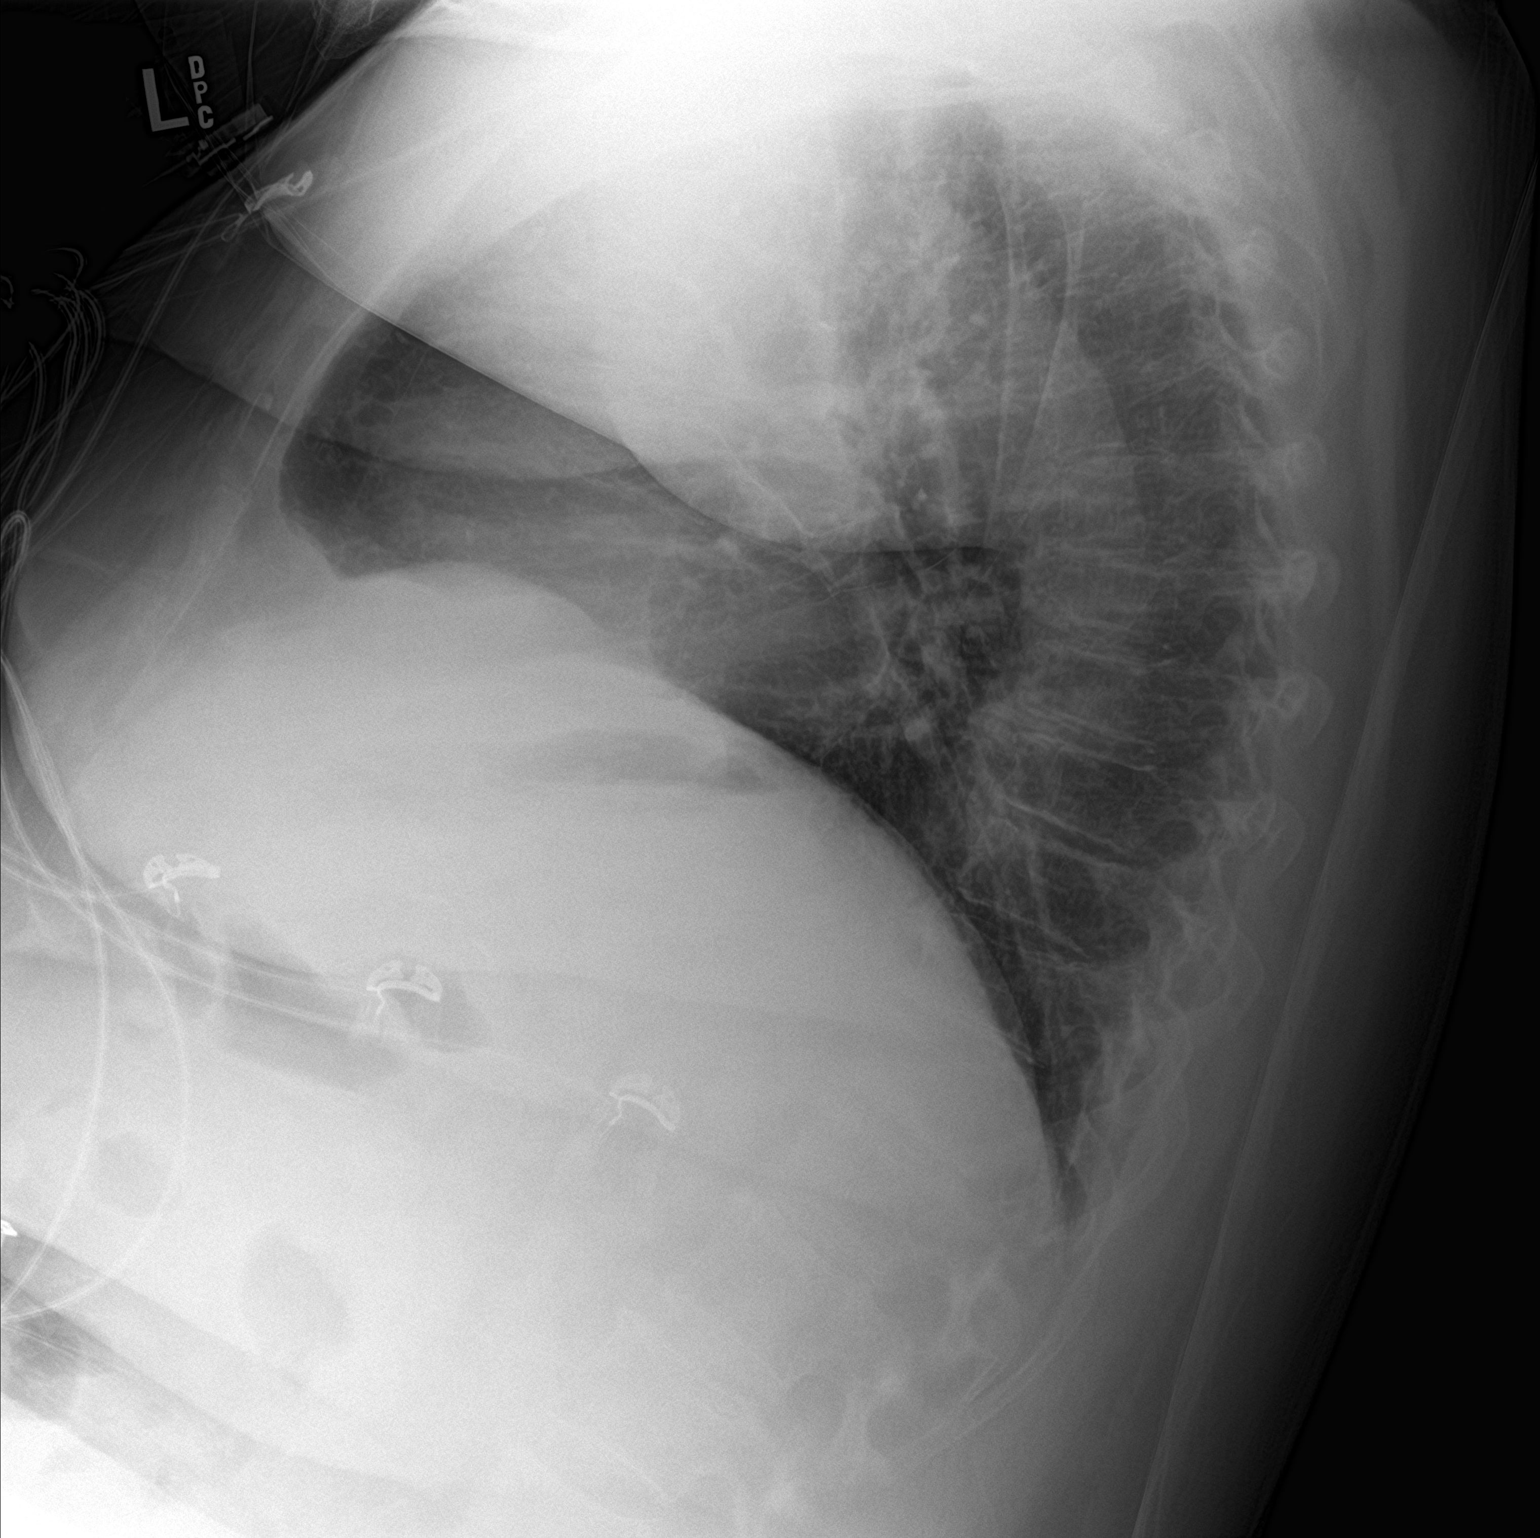

[chest ap]
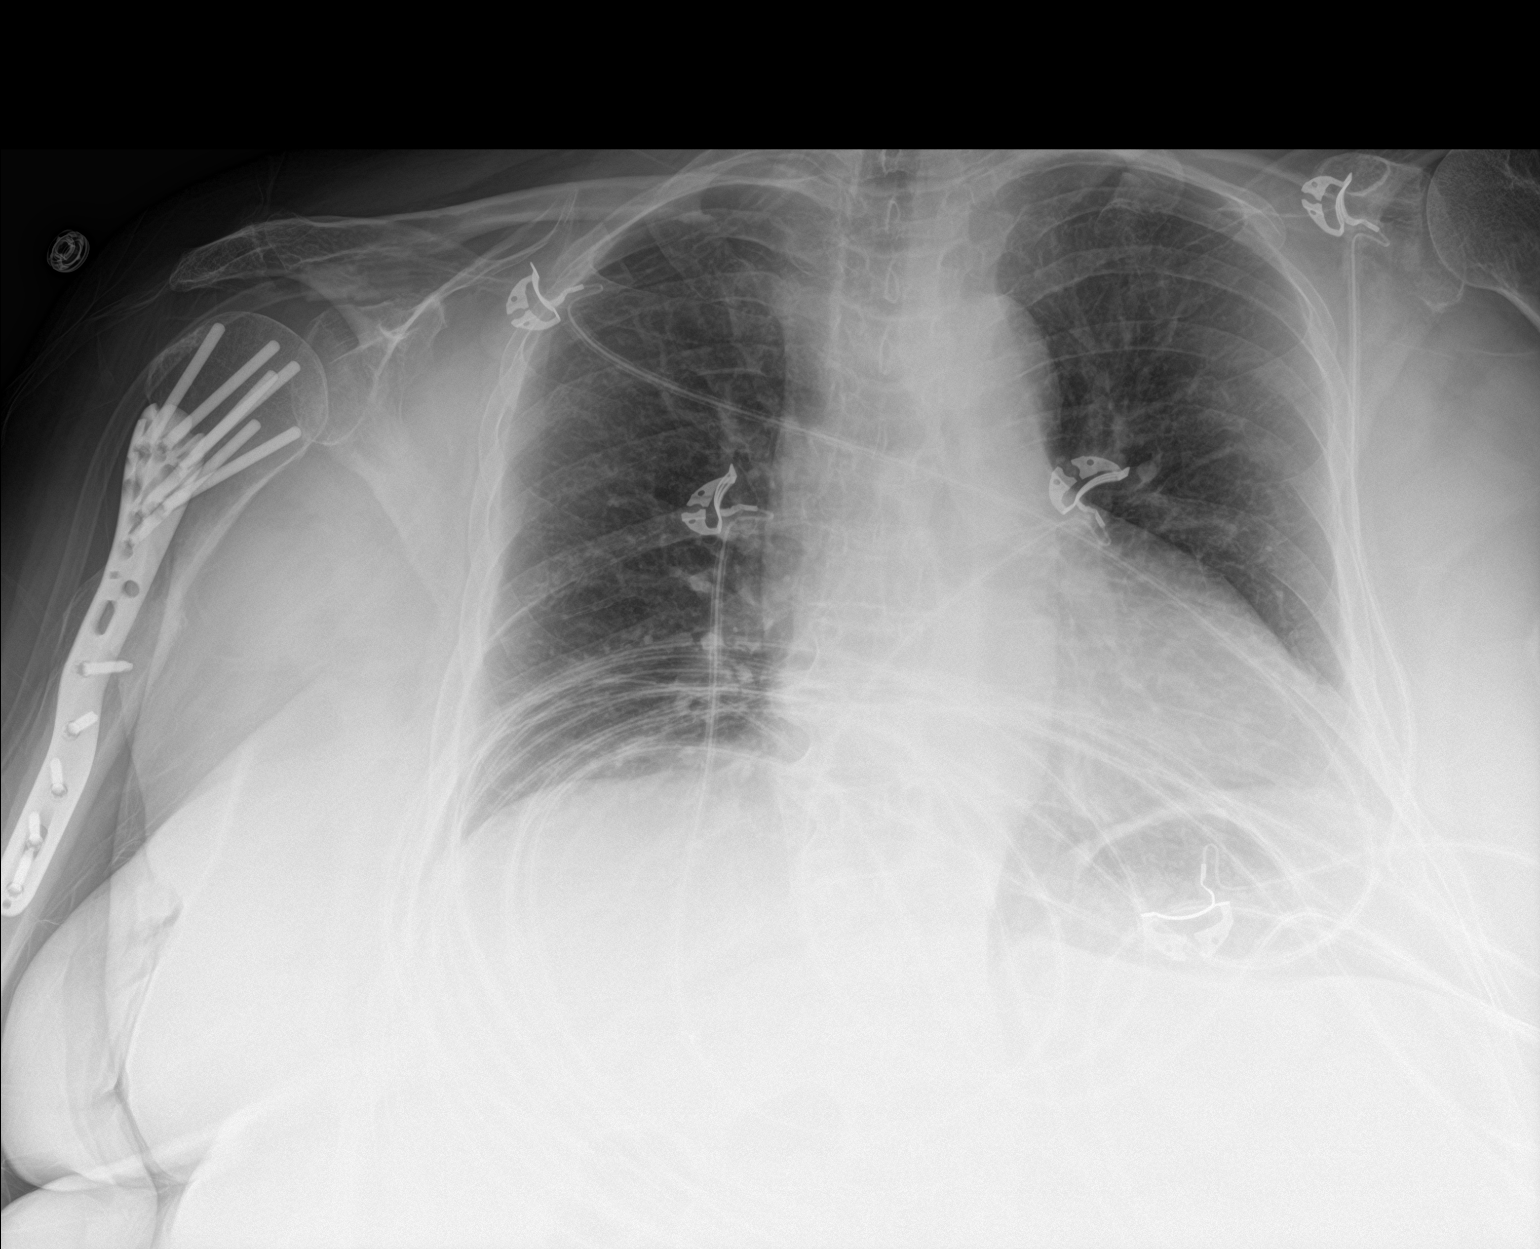

[2 of 2 positions shown; findings below may reference images not displayed]

FINDINGS: Shallow inspiration. Mild cardiac enlargement. No vascular
congestion or edema. No airspace disease or consolidation in the
lungs. No blunting of costophrenic angles. No pneumothorax.
Mediastinal contours appear intact. Tortuous aorta. Postoperative
changes in the right humerus. Degenerative changes in the spine.
IMPRESSION: No active cardiopulmonary disease.

## 2018-10-18 IMAGING — NM NM MYOCAR MULTI W/SPECT W/WALL MOTION & EF
2 series · 12 of 12 positions shown · non-contrast
Comparison: none

[Series 1: rest · 6.51mm/px · 6 of 64 frames shown]
[frame 6/64]
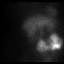
[frame 16/64]
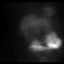
[frame 27/64]
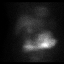
[frame 38/64]
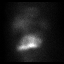
[frame 48/64]
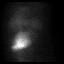
[frame 59/64]
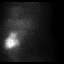

[Series 2: stress gated · 6.51mm/px · 6 of 64 frames shown]
[frame 6/64]
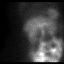
[frame 16/64]
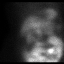
[frame 27/64]
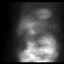
[frame 38/64]
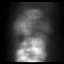
[frame 48/64]
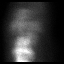
[frame 59/64]
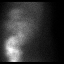

[12 of 12 positions shown; findings below may reference images not displayed]

Canned report from images found in remote index.

Refer to host system for actual result text.

## 2018-10-19 ENCOUNTER — Other Ambulatory Visit: Payer: Self-pay | Admitting: Family Medicine

## 2018-10-22 ENCOUNTER — Other Ambulatory Visit: Payer: Self-pay

## 2018-10-22 ENCOUNTER — Emergency Department (HOSPITAL_COMMUNITY): Payer: Medicare Other

## 2018-10-22 ENCOUNTER — Emergency Department (HOSPITAL_COMMUNITY)
Admission: EM | Admit: 2018-10-22 | Discharge: 2018-10-23 | Disposition: A | Discharge status: Home / Self Care | Payer: Medicare Other | Attending: Emergency Medicine | Admitting: Emergency Medicine

## 2018-10-22 ENCOUNTER — Encounter (HOSPITAL_COMMUNITY): Payer: Self-pay | Admitting: Emergency Medicine

## 2018-10-22 DIAGNOSIS — Z7984 Long term (current) use of oral hypoglycemic drugs: Secondary | ICD-10-CM | POA: Diagnosis not present

## 2018-10-22 DIAGNOSIS — N182 Chronic kidney disease, stage 2 (mild): Secondary | ICD-10-CM | POA: Insufficient documentation

## 2018-10-22 DIAGNOSIS — I129 Hypertensive chronic kidney disease with stage 1 through stage 4 chronic kidney disease, or unspecified chronic kidney disease: Secondary | ICD-10-CM | POA: Diagnosis not present

## 2018-10-22 DIAGNOSIS — E1122 Type 2 diabetes mellitus with diabetic chronic kidney disease: Secondary | ICD-10-CM | POA: Diagnosis not present

## 2018-10-22 DIAGNOSIS — E039 Hypothyroidism, unspecified: Secondary | ICD-10-CM | POA: Insufficient documentation

## 2018-10-22 DIAGNOSIS — E1140 Type 2 diabetes mellitus with diabetic neuropathy, unspecified: Secondary | ICD-10-CM | POA: Insufficient documentation

## 2018-10-22 DIAGNOSIS — R0789 Other chest pain: Secondary | ICD-10-CM | POA: Insufficient documentation

## 2018-10-22 DIAGNOSIS — Z79899 Other long term (current) drug therapy: Secondary | ICD-10-CM | POA: Diagnosis not present

## 2018-10-22 DIAGNOSIS — R079 Chest pain, unspecified: Secondary | ICD-10-CM | POA: Diagnosis not present

## 2018-10-22 DIAGNOSIS — K449 Diaphragmatic hernia without obstruction or gangrene: Secondary | ICD-10-CM | POA: Diagnosis not present

## 2018-10-22 LAB — CBC
HCT: 43.4 % (ref 36.0–46.0)
Hemoglobin: 12.6 g/dL (ref 12.0–15.0)
MCH: 24.2 pg — ABNORMAL LOW (ref 26.0–34.0)
MCHC: 29 g/dL — ABNORMAL LOW (ref 30.0–36.0)
MCV: 83.3 fL (ref 80.0–100.0)
Platelets: 268 10*3/uL (ref 150–400)
RBC: 5.21 MIL/uL — ABNORMAL HIGH (ref 3.87–5.11)
RDW: 17.2 % — ABNORMAL HIGH (ref 11.5–15.5)
WBC: 6.8 10*3/uL (ref 4.0–10.5)
nRBC: 0 % (ref 0.0–0.2)

## 2018-10-22 LAB — BASIC METABOLIC PANEL
Anion gap: 10 (ref 5–15)
BUN: 12 mg/dL (ref 8–23)
CO2: 26 mmol/L (ref 22–32)
Calcium: 10.8 mg/dL — ABNORMAL HIGH (ref 8.9–10.3)
Chloride: 104 mmol/L (ref 98–111)
Creatinine, Ser: 1.19 mg/dL — ABNORMAL HIGH (ref 0.44–1.00)
GFR calc Af Amer: 55 mL/min — ABNORMAL LOW (ref >60)
GFR calc non Af Amer: 47 mL/min — ABNORMAL LOW (ref >60)
Glucose, Bld: 91 mg/dL (ref 70–99)
Potassium: 3.6 mmol/L (ref 3.5–5.1)
Sodium: 140 mmol/L (ref 135–145)

## 2018-10-22 LAB — D-DIMER, QUANTITATIVE: D-Dimer, Quant: 0.32 ug/mL-FEU (ref 0.00–0.50)

## 2018-10-22 LAB — TROPONIN I (HIGH SENSITIVITY): Troponin I (High Sensitivity): 3 ng/L (ref <18)

## 2018-10-22 MED ORDER — FENTANYL CITRATE (PF) 100 MCG/2ML IJ SOLN
50.0000 ug | Freq: Once | INTRAMUSCULAR | Status: AC
  Administered 2018-10-23: 50 ug via INTRAVENOUS
  Filled 2018-10-22: qty 2

## 2018-10-22 NOTE — ED Triage Notes (Signed)
Pt brought in via [facility]. Pt C/O chest pain that began 1.5 hours ago. Pt stating it "feel like someone is ripping my spine out and feels like someone is choking me." Denies N/V. Also C/O SOB.
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
## 2018-10-23 DIAGNOSIS — R0902 Hypoxemia: Secondary | ICD-10-CM | POA: Diagnosis not present

## 2018-10-23 DIAGNOSIS — R079 Chest pain, unspecified: Secondary | ICD-10-CM | POA: Diagnosis not present

## 2018-10-23 DIAGNOSIS — K449 Diaphragmatic hernia without obstruction or gangrene: Secondary | ICD-10-CM | POA: Diagnosis not present

## 2018-10-23 DIAGNOSIS — R0789 Other chest pain: Secondary | ICD-10-CM | POA: Diagnosis not present

## 2018-10-23 DIAGNOSIS — I959 Hypotension, unspecified: Secondary | ICD-10-CM | POA: Diagnosis not present

## 2018-10-23 LAB — TROPONIN I (HIGH SENSITIVITY): Troponin I (High Sensitivity): 3 ng/L (ref <18)

## 2018-10-23 MED ORDER — IOHEXOL 350 MG/ML SOLN
100.0000 mL | Freq: Once | INTRAVENOUS | Status: AC | PRN
  Administered 2018-10-23: 100 mL via INTRAVENOUS

## 2018-10-23 MED ORDER — LIDOCAINE VISCOUS HCL 2 % MT SOLN
15.0000 mL | Freq: Once | OROMUCOSAL | Status: AC
  Administered 2018-10-23: 15 mL via ORAL
  Filled 2018-10-23: qty 15

## 2018-10-23 MED ORDER — NAPROXEN 500 MG PO TABS: 500.0000 mg | ORAL_TABLET | Freq: Two times a day (BID) | ORAL | 0 refills | Status: DC

## 2018-10-23 MED ORDER — ALUM & MAG HYDROXIDE-SIMETH 200-200-20 MG/5ML PO SUSP
30.0000 mL | Freq: Once | ORAL | Status: AC
  Administered 2018-10-23: 30 mL via ORAL
  Filled 2018-10-23: qty 30

## 2018-10-23 MED ORDER — KETOROLAC TROMETHAMINE 30 MG/ML IJ SOLN
15.0000 mg | Freq: Once | INTRAMUSCULAR | Status: AC
  Administered 2018-10-23: 15 mg via INTRAVENOUS
  Filled 2018-10-23: qty 1

## 2018-10-23 MED ORDER — METOCLOPRAMIDE HCL 5 MG/ML IJ SOLN
10.0000 mg | Freq: Once | INTRAMUSCULAR | Status: AC
  Administered 2018-10-23: 03:00:00 10 mg via INTRAVENOUS
  Filled 2018-10-23: qty 2

## 2018-10-23 NOTE — ED Notes (Signed)
Pt ambulatory to waiting room. Pt verbalized understanding of discharge instructions.   

## 2018-10-23 NOTE — Discharge Instructions (Signed)
There is no evidence of heart attack or blood clot in the lung.  Take the anti-inflammatories as prescribed.  Follow-up with your doctor.  Return to the ED if your chest pain becomes exertional, associated shortness of breath, nausea, vomiting, any other concerns.

## 2018-10-23 NOTE — Care Management Important Message (Signed)
   68 year old female with past medical history relevant for GERD/hiatal hernia, HTN, morbid obesity, lumbar stenosis with prior back surgery, hypothyroidism, DM 2, prior history of DVT/PE--- presented to the ED with chest pain radiating to the back---  --- EKG without acute findings -D-dimer not elevated -CBC is unremarkable BMP without significant abnormalities,  except for CKD 2-3 creatinine is 1.1 --- CTA chest without acute findings, patient does have evidence of small hiatal hernia and possible gastroparesis --Highly sensitive troponin negative x2 more than 2 hours apart --- Patient had LHC on 12/24/2016 without obstructive CAD with EF in the 55 to 60% range  Per EDP report chest pain appears to be reproducible  --Suspect non-cardiac etiology for chest discomfort specifically GI and musculoskeletal-  --- Patient already ruled out for ACS by EKG and high-sensitivity troponin x2  --Discussed with EDP---- Medical management of chest discomfort advised, risk factor modifications advised  Roxan Hockey, MD

## 2018-11-04 ENCOUNTER — Ambulatory Visit (INDEPENDENT_AMBULATORY_CARE_PROVIDER_SITE_OTHER): Payer: Medicare Other | Admitting: Family Medicine

## 2018-11-04 ENCOUNTER — Encounter: Payer: Self-pay | Admitting: Family Medicine

## 2018-11-04 VITALS — BP 122/66 | HR 86 | Temp 98.1°F | Resp 14 | Ht 61.0 in | Wt 252.0 lb

## 2018-11-04 DIAGNOSIS — E038 Other specified hypothyroidism: Secondary | ICD-10-CM | POA: Diagnosis not present

## 2018-11-04 DIAGNOSIS — I5189 Other ill-defined heart diseases: Secondary | ICD-10-CM

## 2018-11-04 DIAGNOSIS — E1143 Type 2 diabetes mellitus with diabetic autonomic (poly)neuropathy: Secondary | ICD-10-CM | POA: Diagnosis not present

## 2018-11-04 DIAGNOSIS — I10 Essential (primary) hypertension: Secondary | ICD-10-CM

## 2018-11-04 DIAGNOSIS — F3340 Major depressive disorder, recurrent, in remission, unspecified: Secondary | ICD-10-CM

## 2018-11-04 DIAGNOSIS — E785 Hyperlipidemia, unspecified: Secondary | ICD-10-CM | POA: Diagnosis not present

## 2018-11-04 DIAGNOSIS — R0789 Other chest pain: Secondary | ICD-10-CM

## 2018-11-04 DIAGNOSIS — M810 Age-related osteoporosis without current pathological fracture: Secondary | ICD-10-CM

## 2018-11-04 DIAGNOSIS — R011 Cardiac murmur, unspecified: Secondary | ICD-10-CM

## 2018-11-04 DIAGNOSIS — N182 Chronic kidney disease, stage 2 (mild): Secondary | ICD-10-CM

## 2018-11-04 DIAGNOSIS — Z23 Encounter for immunization: Secondary | ICD-10-CM

## 2018-11-04 DIAGNOSIS — E1149 Type 2 diabetes mellitus with other diabetic neurological complication: Secondary | ICD-10-CM

## 2018-11-04 MED ORDER — ALENDRONATE SODIUM 70 MG PO TABS: ORAL_TABLET | ORAL | 11 refills | Status: DC

## 2018-11-04 MED ORDER — LINACLOTIDE 72 MCG PO CAPS: ORAL_CAPSULE | ORAL | 3 refills | Status: DC

## 2018-11-04 MED ORDER — FAMOTIDINE 20 MG PO TABS: 20.0000 mg | ORAL_TABLET | Freq: Two times a day (BID) | ORAL | 3 refills | Status: DC | PRN

## 2018-11-04 MED ORDER — BUSPIRONE HCL 10 MG PO TABS: 10.0000 mg | ORAL_TABLET | Freq: Three times a day (TID) | ORAL | 3 refills | Status: DC

## 2018-11-04 MED ORDER — SIMVASTATIN 10 MG PO TABS: 10.0000 mg | ORAL_TABLET | Freq: Every day | ORAL | 3 refills | Status: DC

## 2018-11-04 MED ORDER — DEXILANT 60 MG PO CPDR: 1.0000 | DELAYED_RELEASE_CAPSULE | Freq: Every day | ORAL | 3 refills | Status: DC

## 2018-11-04 NOTE — Assessment & Plan Note (Signed)
She has been diet controlled.  Recheck labs today.

## 2018-11-04 NOTE — Assessment & Plan Note (Signed)
She states her medications continue to be filled by her psychiatrist that she has not been seen in over a year.  Recommend that she call her psychiatrist to do a follow-up appointment.

## 2018-11-04 NOTE — Progress Notes (Signed)
Subjective:    Patient ID: Yesenia [name], female    DOB: March 27, 1951, 68 y.o.   MRN: [id number]  Patient presents for Follow-up (is fasting) and Discuss Eye Surgery (having cataracts removed on Monday)  Pt here to f/u chronic medical problems  has not seen cardiology, has not been seen by my office since June 2019.  Was in the ER on 7/18 for chest pain, had substernal chest pain that came out of no where, ER work up was negative   CTA was negative, tropomin negative, EKG nothing acute   advised to f/u cardiology     DM- last A1C 5.9%, CBG at home less than 120 HTN- taking BP meds, does not check at home   Cymabalta/vistaril being prescribed by Dr. Modesta Messing , has not been seen > 1 year   She is on buspar though refill expired in April ??  Peripheral edema- taking lasix as needed  Hypothyroidism- taking synthroid 146mcg, due for repeat labs   Osteoporosis- taking fosmax once a week /not taking vitamin D   Review Of Systems:  GEN- denies fatigue, fever, weight loss,weakness, recent illness HEENT- denies eye drainage, change in vision, nasal discharge, CVS- denies chest pain, palpitations RESP- denies SOB, cough, wheeze ABD- denies N/V, change in stools, abd pain GU- denies dysuria, hematuria, dribbling, incontinence MSK- denies joint pain, muscle aches, injury Neuro- denies headache, dizziness, syncope, seizure activity       Objective:    BP 122/66   Pulse 86   Temp 98.1 F (36.7 C) (Oral)   Resp 14   Ht 5\' 1"  (1.549 m)   Wt 252 lb (114.3 kg)   SpO2 94%   BMI 47.61 kg/m  GEN- NAD, alert and oriented x3 HEENT- PERRL, EOMI, non injected sclera, pink conjunctiva, MMM, oropharynx clear Neck- Supple, no thyromegaly CVS- RRR, 3/6 SEM RESP-CTAB ABD-NABS,soft,NT,ND Psych- normal affect and mood  EXT- trace ankle edema Pulses- Radial, DP- 2+        Assessment & Plan:      Problem List Items Addressed This Visit      Unprioritized   CKD (chronic kidney  disease), stage II   Diabetic neuropathy (HCC)   Relevant Medications   simvastatin (ZOCOR) 10 MG tablet   Diastolic dysfunction    Currently compensated.  No change to Lasix      Relevant Orders   Ambulatory referral to Cardiology   Essential hypertension, benign - Primary    Controlled, no changes to meds Atypical chest pain, has diastolic dysfunction she has not follow-up with cardiology or her other specialist.  She is due to have eye surgery will have her seen by them before she undergoes surgery.  I am checking her labs and her thyroid function studies today.      Relevant Medications   simvastatin (ZOCOR) 10 MG tablet   Other Relevant Orders   CBC with Differential/Platelet   Ambulatory referral to Cardiology   Heart murmur   Relevant Orders   Ambulatory referral to Cardiology   Hyperlipemia   Relevant Medications   simvastatin (ZOCOR) 10 MG tablet   Other Relevant Orders   Lipid panel   Hypothyroidism   Relevant Orders   TSH   T3, free   MDD (major depressive disorder)    She states her medications continue to be filled by her psychiatrist that she has not been seen in over a year.  Recommend that she call her psychiatrist to do a follow-up appointment.  Relevant Medications   busPIRone (BUSPAR) 10 MG tablet   Morbid obesity (HCC)   Osteoporosis    Continue Fosamax.  She will need bone density set up at her next visit. Vitamin D 1000 international units daily      Relevant Medications   alendronate (FOSAMAX) 70 MG tablet   Type II diabetes mellitus with neurological manifestations ([place])    She has been diet controlled.  Recheck labs today.      Relevant Medications   simvastatin (ZOCOR) 10 MG tablet   Other Relevant Orders   Microalbumin / creatinine urine ratio   CBC with Differential/Platelet   Comprehensive metabolic panel   Hemoglobin A1c   HM DIABETES FOOT EXAM (Completed)   Pneumococcal polysaccharide vaccine 23-valent greater than or equal  to 2yo subcutaneous/IM (Completed)    Other Visit Diagnoses    Atypical chest pain       Relevant Orders   Ambulatory referral to Cardiology   Need for prophylactic vaccination against Streptococcus pneumoniae (pneumococcus)       Relevant Orders   Pneumococcal polysaccharide vaccine 23-valent greater than or equal to 2yo subcutaneous/IM (Completed)      Note: This dictation was prepared with Dragon dictation along with smaller phrase technology. Any transcriptional errors that result from this process are unintentional.

## 2018-11-04 NOTE — Assessment & Plan Note (Signed)
Controlled, no changes to meds Atypical chest pain, has diastolic dysfunction she has not follow-up with cardiology or her other specialist.  She is due to have eye surgery will have her seen by them before she undergoes surgery.  I am checking her labs and her thyroid function studies today.

## 2018-11-04 NOTE — Patient Instructions (Signed)
Take vitamin D 1000IU once a day  We will call with results Referral to cardiology for cardiac clearance Call Dr. Modesta Messing behavioral health for your medications F/U 4 months for wellness visit

## 2018-11-04 NOTE — Assessment & Plan Note (Signed)
Currently compensated.  No change to Lasix

## 2018-11-04 NOTE — Assessment & Plan Note (Addendum)
Continue Fosamax.  She will need bone density set up at her next visit. Vitamin D 1000 international units daily

## 2018-11-05 LAB — LIPID PANEL
Cholesterol: 150 mg/dL (ref <200)
HDL: 49 mg/dL — ABNORMAL LOW (ref >=50)
LDL Cholesterol (Calc): 83 mg/dL (calc)
Non-HDL Cholesterol (Calc): 101 mg/dL (calc) (ref <130)
Total CHOL/HDL Ratio: 3.1 (calc) (ref <5.0)
Triglycerides: 88 mg/dL (ref <150)

## 2018-11-05 LAB — COMPREHENSIVE METABOLIC PANEL
AG Ratio: 1.4 (calc) (ref 1.0–2.5)
ALT: 7 U/L (ref 6–29)
AST: 13 U/L (ref 10–35)
Albumin: 4 g/dL (ref 3.6–5.1)
Alkaline phosphatase (APISO): 62 U/L (ref 37–153)
BUN/Creatinine Ratio: 21 (calc) (ref 6–22)
BUN: 21 mg/dL (ref 7–25)
CO2: 21 mmol/L (ref 20–32)
Calcium: 10.2 mg/dL (ref 8.6–10.4)
Chloride: 104 mmol/L (ref 98–110)
Creat: 1.02 mg/dL — ABNORMAL HIGH (ref 0.50–0.99)
Globulin: 2.8 g/dL (calc) (ref 1.9–3.7)
Glucose, Bld: 116 mg/dL — ABNORMAL HIGH (ref 65–99)
Potassium: 4.1 mmol/L (ref 3.5–5.3)
Sodium: 138 mmol/L (ref 135–146)
Total Bilirubin: 0.6 mg/dL (ref 0.2–1.2)
Total Protein: 6.8 g/dL (ref 6.1–8.1)

## 2018-11-05 LAB — CBC WITH DIFFERENTIAL/PLATELET
Absolute Monocytes: 608 cells/uL (ref 200–950)
Basophils Absolute: 47 cells/uL (ref 0–200)
Basophils Relative: 0.6 %
Eosinophils Absolute: 308 cells/uL (ref 15–500)
Eosinophils Relative: 3.9 %
HCT: 40.4 % (ref 35.0–45.0)
Hemoglobin: 12.3 g/dL (ref 11.7–15.5)
Lymphs Abs: 2244 cells/uL (ref 850–3900)
MCH: 24.2 pg — ABNORMAL LOW (ref 27.0–33.0)
MCHC: 30.4 g/dL — ABNORMAL LOW (ref 32.0–36.0)
MCV: 79.4 fL — ABNORMAL LOW (ref 80.0–100.0)
MPV: 11.4 fL (ref 7.5–12.5)
Monocytes Relative: 7.7 %
Neutro Abs: 4693 cells/uL (ref 1500–7800)
Neutrophils Relative %: 59.4 %
Platelets: 304 10*3/uL (ref 140–400)
RBC: 5.09 10*6/uL (ref 3.80–5.10)
RDW: 16.3 % — ABNORMAL HIGH (ref 11.0–15.0)
Total Lymphocyte: 28.4 %
WBC: 7.9 10*3/uL (ref 3.8–10.8)

## 2018-11-05 LAB — HEMOGLOBIN A1C
Hgb A1c MFr Bld: 5.9 % of total Hgb — ABNORMAL HIGH (ref <5.7)
Mean Plasma Glucose: 123 (calc)
eAG (mmol/L): 6.8 (calc)

## 2018-11-05 LAB — TSH: TSH: 8.74 mIU/L — ABNORMAL HIGH (ref 0.40–4.50)

## 2018-11-05 LAB — MICROALBUMIN / CREATININE URINE RATIO
Creatinine, Urine: 49 mg/dL (ref 20–275)
Microalb Creat Ratio: 6 mcg/mg creat (ref <30)
Microalb, Ur: 0.3 mg/dL

## 2018-11-05 LAB — T3, FREE: T3, Free: 2.2 pg/mL — ABNORMAL LOW (ref 2.3–4.2)

## 2018-11-07 DIAGNOSIS — H2513 Age-related nuclear cataract, bilateral: Secondary | ICD-10-CM | POA: Diagnosis not present

## 2018-11-07 DIAGNOSIS — H43813 Vitreous degeneration, bilateral: Secondary | ICD-10-CM | POA: Diagnosis not present

## 2018-11-16 ENCOUNTER — Encounter: Payer: Self-pay | Admitting: *Deleted

## 2018-11-16 MED ORDER — LEVOTHYROXINE SODIUM 175 MCG PO TABS: 175.0000 ug | ORAL_TABLET | Freq: Every day | ORAL | 3 refills | Status: DC

## 2018-11-18 ENCOUNTER — Other Ambulatory Visit: Payer: Self-pay | Admitting: Family Medicine

## 2018-11-18 NOTE — Telephone Encounter (Signed)
Last office visit: 11/04/2018 Last refilled: 09/09/2018

## 2018-12-05 ENCOUNTER — Telehealth: Payer: Self-pay | Admitting: Orthopedic Surgery

## 2018-12-05 NOTE — Telephone Encounter (Signed)
Patient called (left message and we then spoke with) to relay that she "took a hard fall, and fell face down onto the street, and onto her knees.  We relayed protocol per Dr Aline Brochure to have evaluation and workup at Emergency room or urgent care; said will do that, and then will call back if needs to schedule.

## 2018-12-06 ENCOUNTER — Emergency Department (HOSPITAL_COMMUNITY): Payer: Medicare Other

## 2018-12-06 ENCOUNTER — Emergency Department (HOSPITAL_COMMUNITY)
Admission: EM | Admit: 2018-12-06 | Discharge: 2018-12-06 | Disposition: A | Discharge status: Home / Self Care | Payer: Medicare Other | Attending: Emergency Medicine | Admitting: Emergency Medicine

## 2018-12-06 ENCOUNTER — Encounter (HOSPITAL_COMMUNITY): Payer: Self-pay

## 2018-12-06 ENCOUNTER — Other Ambulatory Visit: Payer: Self-pay

## 2018-12-06 DIAGNOSIS — E039 Hypothyroidism, unspecified: Secondary | ICD-10-CM | POA: Insufficient documentation

## 2018-12-06 DIAGNOSIS — S8992XA Unspecified injury of left lower leg, initial encounter: Secondary | ICD-10-CM | POA: Diagnosis not present

## 2018-12-06 DIAGNOSIS — Z86718 Personal history of other venous thrombosis and embolism: Secondary | ICD-10-CM | POA: Insufficient documentation

## 2018-12-06 DIAGNOSIS — Y929 Unspecified place or not applicable: Secondary | ICD-10-CM | POA: Diagnosis not present

## 2018-12-06 DIAGNOSIS — M25551 Pain in right hip: Secondary | ICD-10-CM | POA: Diagnosis not present

## 2018-12-06 DIAGNOSIS — S8991XA Unspecified injury of right lower leg, initial encounter: Secondary | ICD-10-CM | POA: Diagnosis not present

## 2018-12-06 DIAGNOSIS — Y939 Activity, unspecified: Secondary | ICD-10-CM | POA: Insufficient documentation

## 2018-12-06 DIAGNOSIS — Z86711 Personal history of pulmonary embolism: Secondary | ICD-10-CM | POA: Insufficient documentation

## 2018-12-06 DIAGNOSIS — Y999 Unspecified external cause status: Secondary | ICD-10-CM | POA: Diagnosis not present

## 2018-12-06 DIAGNOSIS — S8002XA Contusion of left knee, initial encounter: Secondary | ICD-10-CM | POA: Insufficient documentation

## 2018-12-06 DIAGNOSIS — S79911A Unspecified injury of right hip, initial encounter: Secondary | ICD-10-CM | POA: Diagnosis not present

## 2018-12-06 DIAGNOSIS — I10 Essential (primary) hypertension: Secondary | ICD-10-CM | POA: Insufficient documentation

## 2018-12-06 DIAGNOSIS — E119 Type 2 diabetes mellitus without complications: Secondary | ICD-10-CM | POA: Insufficient documentation

## 2018-12-06 DIAGNOSIS — S8001XA Contusion of right knee, initial encounter: Secondary | ICD-10-CM | POA: Insufficient documentation

## 2018-12-06 DIAGNOSIS — S8000XA Contusion of unspecified knee, initial encounter: Secondary | ICD-10-CM

## 2018-12-06 DIAGNOSIS — M25561 Pain in right knee: Secondary | ICD-10-CM | POA: Diagnosis not present

## 2018-12-06 DIAGNOSIS — M7918 Myalgia, other site: Secondary | ICD-10-CM

## 2018-12-06 DIAGNOSIS — M25552 Pain in left hip: Secondary | ICD-10-CM | POA: Diagnosis not present

## 2018-12-06 DIAGNOSIS — Z79899 Other long term (current) drug therapy: Secondary | ICD-10-CM | POA: Insufficient documentation

## 2018-12-06 DIAGNOSIS — W19XXXA Unspecified fall, initial encounter: Secondary | ICD-10-CM | POA: Insufficient documentation

## 2018-12-06 MED ORDER — HYDROCODONE-ACETAMINOPHEN 5-325 MG PO TABS: 1.0000 | ORAL_TABLET | Freq: Four times a day (QID) | ORAL | 0 refills | Status: DC | PRN

## 2018-12-06 MED ORDER — HYDROCODONE-ACETAMINOPHEN 5-325 MG PO TABS
1.0000 | ORAL_TABLET | Freq: Once | ORAL | Status: AC
  Administered 2018-12-06: 10:00:00 1 via ORAL
  Filled 2018-12-06: qty 1

## 2018-12-06 NOTE — ED Triage Notes (Signed)
Pt was walking home on Sunday evening and tripped and fell. Is having bilateral knee pain, but the left knee is worse than the right. Is ambulatory with a slight limp. Pt did not pass out and did not hit head.
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
## 2018-12-06 NOTE — ED Provider Notes (Signed)
This patient is a 68 year old female, presents with a fall that occurred a couple of days ago.  She has bilateral knee pain, and to a lesser extent bilateral hip pain.  She has been ambulating with the family members walker.  On exam she is able to straight leg raise bilaterally but does have some bruising over the anterior knees and some pain with range of motion of the hips and the internal and external rotation.  She does have a systolic murmur which she was aware of.  We will rule out fractures with imaging, patient stable appearing at this time with no signs of upper extremity injury or head trauma and no neck or back pain.  Medical screening examination/treatment/procedure(s) were conducted as a shared visit with non-physician practitioner(s) and myself.  I personally evaluated the patient during the encounter.  Clinical Impression:   Final diagnoses:  Contusion of knee, unspecified laterality, initial encounter  Musculoskeletal pain         Noemi Chapel, MD 12/07/18 [phone]

## 2018-12-06 NOTE — Discharge Instructions (Addendum)
Your xrays are negative for any acute fracture or dislocation.  Ice and elevate your knees as much as is comfortable.  You may also take additional tylenol for extra pain relief, but the maximum safe dose would be 650 mg every 6 hours as your hydrocodone also contains tylenol (acetaminophen).  Plan to see your primary MD or Dr. Aline Brochure if your symptoms are not improving over the next week.  You may take the hydrocodone prescribed for pain relief.  This will make you drowsy - do not drive within 4 hours of taking this medication.

## 2018-12-06 NOTE — ED Notes (Signed)
ED Provider at bedside. 

## 2018-12-06 NOTE — ED Notes (Signed)
Pt returned from xray

## 2018-12-06 NOTE — ED Notes (Signed)
Patient transported to X-ray 

## 2018-12-08 NOTE — H&P (Signed)
Surgical History & Physical  Patient Name: Yesenia [name] DOB: 1950-09-04  Surgery: Cataract extraction with intraocular lens implant phacoemulsification; Left Eye  Surgeon: Baruch Goldmann MD Surgery Date:  12/23/2018 Pre-Op Date:  12/08/2018  HPI: A 39 Yr. old female patient 1. 1. The patient complains of difficulty when viewing TV, reading closed caption, news scrolls on TV, which began 7 months ago. Both eyes are affected. The episode is gradual. The condition's severity increased since last visit. Symptoms occur when the patient is driving, outside and reading. Pt states she no more drives. C/O glare. This is negatively affecting the patient's quality of life. The left eye is worse. HPI Completed by Dr. Baruch Goldmann  Medical History: Myopia,Astigmatism Macula Degeneration Diabetes High Blood Pressure LDL Thyroid Problems  Review of Systems Negative Allergic/Immunologic Negative Cardiovascular Negative Constitutional Negative Ear, Nose, Mouth & Throat Negative Endocrine Negative Eyes Negative Gastrointestinal Negative Genitourinary Negative Hemotologic/Lymphatic Negative Integumentary Negative Musculoskeletal Negative Neurological Negative Psychiatry Negative Respiratory  Social   Never smoked  Medication Victoza, Blood Pressure Medication, Cholesterol Medication, Alendronate, Buspirone, Dexilant, Duloxetine, Famotidine, Levothyroxine Sodium, Linzess, Trazodone, Lyrica, Vistoril,   Sx/Procedures Arm, wrist, shoulder, knee sx,   Drug Allergies   NKDA  History & Physical: Heent:  Cataract, Left eye NECK: supple without bruits LUNGS: lungs clear to auscultation CV: regular rate and rhythm Abdomen: soft and non-tender  Impression & Plan: Assessment: 1.  NUCLEAR SCLEROSIS AGE RELATED; Both Eyes (H25.13) 2.  VITREOUS DETACHMENT PVD; Both Eyes (H43.813)  Plan: 1.  Cataract accounts for the patient's decreased vision. This visual impairment is not correctable  with a tolerable change in glasses or contact lenses. Cataract surgery with an implantation of a new lens should significantly improve the visual and functional status of the patient. Discussed all risks, benefits, alternatives, and potential complications. Discussed the procedures and recovery. Patient desires to have surgery. A-scan ordered and performed today for intra-ocular lens calculations. The surgery will be performed in order to improve vision for driving, reading, and for eye examinations. Recommend phacoemulsification with intra-ocular lens. Left Eye worse - first. Dilates well - shugarcaine by protocol. High Myope. 2.  Old Asymptomatic. RD precautions given. Patient to call with increase in flashing lights/floaters/dark curtain. Symptomatic.

## 2018-12-15 ENCOUNTER — Encounter: Payer: Self-pay | Admitting: Orthopaedic Surgery

## 2018-12-15 ENCOUNTER — Ambulatory Visit (INDEPENDENT_AMBULATORY_CARE_PROVIDER_SITE_OTHER): Payer: Medicare Other | Admitting: Orthopaedic Surgery

## 2018-12-15 ENCOUNTER — Other Ambulatory Visit: Payer: Self-pay

## 2018-12-15 DIAGNOSIS — M25562 Pain in left knee: Secondary | ICD-10-CM | POA: Diagnosis not present
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
## 2018-12-15 NOTE — Patient Instructions (Signed)
Your procedure is scheduled on:  12/23/2018               Report to [facility] at   10:00  AM.  Call this number if you have problems the morning of surgery: [phone]   Remember:   Do not eat or drink :After Midnight.    Take these medicines the morning of surgery with A SIP OF WATER:       Buspar, Dexilant, Cymbalta, pepcid, and levothyroxine     Do not wear jewelry, make-up or nail polish.  Do not wear lotions, powders, or perfumes. You may wear deodorant.  Do not bring valuables to the hospital.  Contacts, dentures or bridgework may not be worn into surgery.  Patients discharged the day of surgery will not be allowed to drive home.  Name and phone number of your driver.                                                                                                                                       Cataract Surgery  A cataract is a clouding of the lens of the eye. When a lens becomes cloudy, vision is reduced based on the degree and nature of the clouding. Surgery may be needed to improve vision. Surgery removes the cloudy lens and usually replaces it with a substitute lens (intraocular lens, IOL). LET YOUR EYE DOCTOR KNOW ABOUT:  Allergies to food or medicine.   Medicines taken including herbs, eyedrops, over-the-counter medicines, and creams.   Use of steroids (by mouth or creams).   Previous problems with anesthetics or numbing medicine.   History of bleeding problems or blood clots.   Previous surgery.   Other health problems, including diabetes and kidney problems.   Possibility of pregnancy, if this applies.  RISKS AND COMPLICATIONS  Infection.   Inflammation of the eyeball (endophthalmitis) that can spread to both eyes (sympathetic ophthalmia).   Poor wound healing.   If an IOL is inserted, it can later fall out of proper position. This is very uncommon.   Clouding of the part of your eye that holds an IOL in place. This is called an "after-cataract."  These are uncommon, but easily treated.  BEFORE THE PROCEDURE  Do not eat or drink anything except small amounts of water for 8 to 12 before your surgery, or as directed by your caregiver.    Unless you are told otherwise, continue any eyedrops you have been prescribed.   Talk to your primary caregiver about all other medicines that you take (both prescription and non-prescription). In some cases, you may need to stop or change medicines near the time of your surgery. This is most important if you are taking blood-thinning medicine. Do not stop medicines unless you are told to do so.   Arrange for someone to drive you to and from the procedure.   Do not put contact  lenses in either eye on the day of your surgery.  PROCEDURE There is more than one method for safely removing a cataract. Your doctor can explain the differences and help determine which is best for you. Phacoemulsification surgery is the most common form of cataract surgery.  An injection is given behind the eye or eyedrops are given to make this a painless procedure.   A small cut (incision) is made on the edge of the clear, dome-shaped surface that covers the front of the eye (cornea).   A tiny probe is painlessly inserted into the eye. This device gives off ultrasound waves that soften and break up the cloudy center of the lens. This makes it easier for the cloudy lens to be removed by suction.   An IOL may be implanted.   The normal lens of the eye is covered by a clear capsule. Part of that capsule is intentionally left in the eye to support the IOL.   Your surgeon may or may not use stitches to close the incision.  There are other forms of cataract surgery that require a larger incision and stiches to close the eye. This approach is taken in cases where the doctor feels that the cataract cannot be easily removed using phacoemulsification. AFTER THE PROCEDURE  When an IOL is implanted, it does not need care. It becomes  a permanent part of your eye and cannot be seen or felt.   Your doctor will schedule follow-up exams to check on your progress.   Review your other medicines with your doctor to see which can be resumed after surgery.   Use eyedrops or take medicine as prescribed by your doctor.  Document Released: 03/12/2011 Document Reviewed: 03/09/2011 [facility] Patient Information 2012 [place].  .Cataract Surgery Care After Refer to this sheet in the next few weeks. These instructions provide you with information on caring for yourself after your procedure. Your caregiver may also give you more specific instructions. Your treatment has been planned according to current medical practices, but problems sometimes occur. Call your caregiver if you have any problems or questions after your procedure.  HOME CARE INSTRUCTIONS   Avoid strenuous activities as directed by your caregiver.   Ask your caregiver when you can resume driving.   Use eyedrops or other medicines to help healing and control pressure inside your eye as directed by your caregiver.   Only take over-the-counter or prescription medicines for pain, discomfort, or fever as directed by your caregiver.   Do not to touch or rub your eyes.   You may be instructed to use a protective shield during the first few days and nights after surgery. If not, wear sunglasses to protect your eyes. This is to protect the eye from pressure or from being accidentally bumped.   Keep the area around your eye clean and dry. Avoid swimming or allowing water to hit you directly in the face while showering. Keep soap and shampoo out of your eyes.   Do not bend or lift heavy objects. Bending increases pressure in the eye. You can walk, climb stairs, and do light household chores.   Do not put a contact lens into the eye that had surgery until your caregiver says it is okay to do so.   Ask your doctor when you can return to work. This will depend on the kind  of work that you do. If you work in a dusty environment, you may be advised to wear protective eyewear for a  period of time.   Ask your caregiver when it will be safe to engage in sexual activity.   Continue with your regular eye exams as directed by your caregiver.  What to expect:  It is normal to feel itching and mild discomfort for a few days after cataract surgery. Some fluid discharge is also common, and your eye may be sensitive to light and touch.   After 1 to 2 days, even moderate discomfort should disappear. In most cases, healing will take about 6 weeks.   If you received an intraocular lens (IOL), you may notice that colors are very bright or have a blue tinge. Also, if you have been in bright sunlight, everything may appear reddish for a few hours. If you see these color tinges, it is because your lens is clear and no longer cloudy. Within a few months after receiving an IOL, these extra colors should go away. When you have healed, you will probably need new glasses.  SEEK MEDICAL CARE IF:   You have increased bruising around your eye.   You have discomfort not helped by medicine.  SEEK IMMEDIATE MEDICAL CARE IF:   You have a  fever.   You have a worsening or sudden vision loss.   You have redness, swelling, or increasing pain in the eye.   You have a thick discharge from the eye that had surgery.  MAKE SURE YOU:  Understand these instructions.   Will watch your condition.   Will get help right away if you are not doing well or get worse.  Document Released: 10/10/2004 Document Revised: 03/12/2011 Document Reviewed: 11/14/2010 [facility] Patient Information 2012 [place].    Monitored Anesthesia Care  Monitored anesthesia care is an anesthesia service for a medical procedure. Anesthesia is the loss of the ability to feel pain. It is produced by medications called anesthetics. It may affect a small area of your body (local anesthesia), a large area of your body  (regional anesthesia), or your entire body (general anesthesia). The need for monitored anesthesia care depends your procedure, your condition, and the potential need for regional or general anesthesia. It is often provided during procedures where:   General anesthesia may be needed if there are complications. This is because you need special care when you are under general anesthesia.    You will be under local or regional anesthesia. This is so that you are able to have higher levels of anesthesia if needed.    You will receive calming medications (sedatives). This is especially the case if sedatives are given to put you in a semi-conscious state of relaxation (deep sedation). This is because the amount of sedative needed to produce this state can be hard to predict. Too much of a sedative can produce general anesthesia. Monitored anesthesia care is performed by one or more caregivers who have special training in all types of anesthesia. You will need to meet with these caregivers before your procedure. During this meeting, they will ask you about your medical history. They will also give you instructions to follow. (For example, you will need to stop eating and drinking before your procedure. You may also need to stop or change medications you are taking.) During your procedure, your caregivers will stay with you. They will:   Watch your condition. This includes watching you blood pressure, breathing, and level of pain.    Diagnose and treat problems that occur.    Give medications if they are needed. These may include  calming medications (sedatives) and anesthetics.    Make sure you are comfortable.   Having monitored anesthesia care does not necessarily mean that you will be under anesthesia. It does mean that your caregivers will be able to manage anesthesia if you need it or if it occurs. It also means that you will be able to have a different type of anesthesia than you are having if you need  it. When your procedure is complete, your caregivers will continue to watch your condition. They will make sure any medications wear off before you are allowed to go home.  Document Released: 12/17/2004 Document Revised: 07/18/2012 Document Reviewed: 05/04/2012 [facility] Patient Information 2014 [place], [place].

## 2018-12-16 DIAGNOSIS — H25812 Combined forms of age-related cataract, left eye: Secondary | ICD-10-CM | POA: Diagnosis not present

## 2018-12-19 ENCOUNTER — Other Ambulatory Visit (HOSPITAL_COMMUNITY)
Admission: RE | Admit: 2018-12-19 | Discharge: 2018-12-19 | Disposition: A | Discharge status: Home / Self Care | Payer: Medicare Other | Source: Ambulatory Visit | Attending: Ophthalmology | Admitting: Ophthalmology

## 2018-12-19 ENCOUNTER — Encounter (HOSPITAL_COMMUNITY)
Admission: RE | Admit: 2018-12-19 | Discharge: 2018-12-19 | Disposition: A | Discharge status: Home / Self Care | Payer: Medicare Other | Source: Ambulatory Visit | Attending: Ophthalmology | Admitting: Ophthalmology

## 2018-12-19 ENCOUNTER — Other Ambulatory Visit: Payer: Self-pay

## 2018-12-19 ENCOUNTER — Other Ambulatory Visit (HOSPITAL_COMMUNITY): Payer: Medicare Other

## 2018-12-19 DIAGNOSIS — Z01812 Encounter for preprocedural laboratory examination: Secondary | ICD-10-CM | POA: Diagnosis not present

## 2018-12-19 DIAGNOSIS — Z20828 Contact with and (suspected) exposure to other viral communicable diseases: Secondary | ICD-10-CM | POA: Diagnosis not present

## 2018-12-19 LAB — SARS CORONAVIRUS 2 (TAT 6-24 HRS): SARS Coronavirus 2: NEGATIVE

## 2018-12-23 ENCOUNTER — Ambulatory Visit (HOSPITAL_COMMUNITY): Payer: Medicare Other | Admitting: Anesthesiology

## 2018-12-23 ENCOUNTER — Encounter (HOSPITAL_COMMUNITY): Payer: Self-pay | Admitting: *Deleted

## 2018-12-23 ENCOUNTER — Ambulatory Visit (HOSPITAL_COMMUNITY)
Admission: RE | Admit: 2018-12-23 | Discharge: 2018-12-23 | Disposition: A | Discharge status: Home / Self Care | Payer: Medicare Other | Attending: Ophthalmology | Admitting: Ophthalmology

## 2018-12-23 ENCOUNTER — Encounter (HOSPITAL_COMMUNITY)
Admission: RE | Disposition: A | Discharge status: Home / Self Care | Payer: Self-pay | Source: Home / Self Care | Attending: Ophthalmology

## 2018-12-23 ENCOUNTER — Other Ambulatory Visit: Payer: Self-pay

## 2018-12-23 DIAGNOSIS — H3530 Unspecified macular degeneration: Secondary | ICD-10-CM | POA: Diagnosis not present

## 2018-12-23 DIAGNOSIS — K219 Gastro-esophageal reflux disease without esophagitis: Secondary | ICD-10-CM | POA: Diagnosis not present

## 2018-12-23 DIAGNOSIS — F419 Anxiety disorder, unspecified: Secondary | ICD-10-CM | POA: Diagnosis not present

## 2018-12-23 DIAGNOSIS — M1990 Unspecified osteoarthritis, unspecified site: Secondary | ICD-10-CM | POA: Diagnosis not present

## 2018-12-23 DIAGNOSIS — Z79890 Hormone replacement therapy (postmenopausal): Secondary | ICD-10-CM | POA: Diagnosis not present

## 2018-12-23 DIAGNOSIS — E119 Type 2 diabetes mellitus without complications: Secondary | ICD-10-CM | POA: Diagnosis not present

## 2018-12-23 DIAGNOSIS — H43813 Vitreous degeneration, bilateral: Secondary | ICD-10-CM | POA: Diagnosis not present

## 2018-12-23 DIAGNOSIS — Z7983 Long term (current) use of bisphosphonates: Secondary | ICD-10-CM | POA: Insufficient documentation

## 2018-12-23 DIAGNOSIS — H52209 Unspecified astigmatism, unspecified eye: Secondary | ICD-10-CM | POA: Insufficient documentation

## 2018-12-23 DIAGNOSIS — Z79899 Other long term (current) drug therapy: Secondary | ICD-10-CM | POA: Insufficient documentation

## 2018-12-23 DIAGNOSIS — G4730 Sleep apnea, unspecified: Secondary | ICD-10-CM | POA: Insufficient documentation

## 2018-12-23 DIAGNOSIS — E7800 Pure hypercholesterolemia, unspecified: Secondary | ICD-10-CM | POA: Diagnosis not present

## 2018-12-23 DIAGNOSIS — H25812 Combined forms of age-related cataract, left eye: Secondary | ICD-10-CM | POA: Diagnosis not present

## 2018-12-23 DIAGNOSIS — I10 Essential (primary) hypertension: Secondary | ICD-10-CM | POA: Diagnosis not present

## 2018-12-23 DIAGNOSIS — E1136 Type 2 diabetes mellitus with diabetic cataract: Secondary | ICD-10-CM | POA: Insufficient documentation

## 2018-12-23 DIAGNOSIS — F329 Major depressive disorder, single episode, unspecified: Secondary | ICD-10-CM | POA: Insufficient documentation

## 2018-12-23 DIAGNOSIS — Z79891 Long term (current) use of opiate analgesic: Secondary | ICD-10-CM | POA: Insufficient documentation

## 2018-12-23 DIAGNOSIS — H5210 Myopia, unspecified eye: Secondary | ICD-10-CM | POA: Insufficient documentation

## 2018-12-23 DIAGNOSIS — E039 Hypothyroidism, unspecified: Secondary | ICD-10-CM | POA: Diagnosis not present

## 2018-12-23 HISTORY — PX: CATARACT EXTRACTION W/PHACO: SHX586

## 2018-12-23 LAB — GLUCOSE, CAPILLARY: Glucose-Capillary: 94 mg/dL (ref 70–99)

## 2018-12-23 SURGERY — PHACOEMULSIFICATION, CATARACT, WITH IOL INSERTION
Anesthesia: Monitor Anesthesia Care | Site: Eye | Laterality: Left

## 2018-12-23 MED ORDER — EPINEPHRINE PF 1 MG/ML IJ SOLN
INTRAOCULAR | Status: DC | PRN
  Administered 2018-12-23: 500 mL

## 2018-12-23 MED ORDER — BSS IO SOLN
INTRAOCULAR | Status: DC | PRN
  Administered 2018-12-23: 15 mL

## 2018-12-23 MED ORDER — TETRACAINE HCL 0.5 % OP SOLN
1.0000 [drp] | OPHTHALMIC | Status: AC | PRN
  Administered 2018-12-23 (×3): 1 [drp] via OPHTHALMIC

## 2018-12-23 MED ORDER — PHENYLEPHRINE HCL 2.5 % OP SOLN
1.0000 [drp] | OPHTHALMIC | Status: AC | PRN
  Administered 2018-12-23 (×3): 1 [drp] via OPHTHALMIC

## 2018-12-23 MED ORDER — POVIDONE-IODINE 5 % OP SOLN
OPHTHALMIC | Status: DC | PRN
  Administered 2018-12-23: 1 via OPHTHALMIC

## 2018-12-23 MED ORDER — LIDOCAINE HCL (PF) 1 % IJ SOLN
INTRAOCULAR | Status: DC | PRN
  Administered 2018-12-23: 11:00:00 1 mL via OPHTHALMIC

## 2018-12-23 MED ORDER — NEOMYCIN-POLYMYXIN-DEXAMETH 3.5-10000-0.1 OP SUSP
OPHTHALMIC | Status: DC | PRN
  Administered 2018-12-23: 1 [drp] via OPHTHALMIC

## 2018-12-23 MED ORDER — CYCLOPENTOLATE-PHENYLEPHRINE 0.2-1 % OP SOLN
1.0000 [drp] | OPHTHALMIC | Status: AC | PRN
  Administered 2018-12-23 (×3): 1 [drp] via OPHTHALMIC

## 2018-12-23 MED ORDER — LIDOCAINE HCL 3.5 % OP GEL: 1.0000 "application " | Freq: Once | OPHTHALMIC | Status: DC

## 2018-12-23 MED ORDER — SODIUM HYALURONATE 23 MG/ML IO SOLN
INTRAOCULAR | Status: DC | PRN
  Administered 2018-12-23: 0.6 mL via INTRAOCULAR

## 2018-12-23 MED ORDER — PROVISC 10 MG/ML IO SOLN
INTRAOCULAR | Status: DC | PRN
  Administered 2018-12-23: 0.85 mL via INTRAOCULAR

## 2018-12-23 SURGICAL SUPPLY — 13 items
CLOTH BEACON ORANGE TIMEOUT ST (SAFETY) ×2 IMPLANT
EYE SHIELD UNIVERSAL CLEAR (GAUZE/BANDAGES/DRESSINGS) ×3 IMPLANT
GLOVE BIOGEL PI IND STRL 7.0 (GLOVE) ×2 IMPLANT
GLOVE BIOGEL PI INDICATOR 7.0 (GLOVE) ×4
LENS ALC ACRYL/TECN (Ophthalmic Related) ×2 IMPLANT
NDL HYPO 18GX1.5 BLUNT FILL (NEEDLE) IMPLANT
NEEDLE HYPO 18GX1.5 BLUNT FILL (NEEDLE) ×3 IMPLANT
PAD ARMBOARD 7.5X6 YLW CONV (MISCELLANEOUS) ×3 IMPLANT
SYR TB 1ML LL NO SAFETY (SYRINGE) ×2 IMPLANT
TAPE SURG TRANSPORE 1 IN (GAUZE/BANDAGES/DRESSINGS) IMPLANT
TAPE SURGICAL TRANSPORE 1 IN (GAUZE/BANDAGES/DRESSINGS) ×2
VISCOELASTIC ADDITIONAL (OPHTHALMIC RELATED) ×2 IMPLANT
WATER STERILE IRR 250ML POUR (IV SOLUTION) ×2 IMPLANT

## 2018-12-23 NOTE — Anesthesia Postprocedure Evaluation (Signed)
Anesthesia Post Note  Patient: Yesenia [name]  Procedure(s) Performed: CATARACT EXTRACTION PHACO AND INTRAOCULAR LENS PLACEMENT LEFT EYE (Left Eye)  Patient location during evaluation: Short Stay Anesthesia Type: MAC Level of consciousness: awake Pain management: pain level controlled Vital Signs Assessment: post-procedure vital signs reviewed and stable Respiratory status: spontaneous breathing Cardiovascular status: stable Postop Assessment: no apparent nausea or vomiting and able to ambulate Anesthetic complications: no     Last Vitals:  Vitals:   12/23/18 1009  BP: (!) 167/89  Pulse: 69  Resp: 16  Temp: 37.1 C  SpO2: 98%    Last Pain:  Vitals:   12/23/18 1009  TempSrc: Oral  PainSc: 0-No pain                 Ali Mohl Hristova

## 2018-12-23 NOTE — Interval H&P Note (Signed)
History and Physical Interval Note: The H and P was reviewed and updated. The patient was examined.  No changes were found after exam.  The surgical eye was marked.  12/23/2018 10:46 AM  Yesenia [name]  has presented today for surgery, with the diagnosis of nuclear cataract left eye.  The various methods of treatment have been discussed with the patient and family. After consideration of risks, benefits and other options for treatment, the patient has consented to  Procedure(s) with comments: CATARACT EXTRACTION PHACO AND INTRAOCULAR LENS PLACEMENT LEFT EYE (Left) - left as a surgical intervention.  The patient's history has been reviewed, patient examined, no change in status, stable for surgery.  I have reviewed the patient's chart and labs.  Questions were answered to the patient's satisfaction.     Baruch Goldmann

## 2018-12-23 NOTE — Transfer of Care (Signed)
Immediate Anesthesia Transfer of Care Note  Patient: Yesenia [name]  Procedure(s) Performed: CATARACT EXTRACTION PHACO AND INTRAOCULAR LENS PLACEMENT LEFT EYE (Left Eye)  Patient Location: Short Stay  Anesthesia Type:MAC  Level of Consciousness: awake  Airway & Oxygen Therapy: Patient Spontanous Breathing  Post-op Assessment: Report given to RN  Post vital signs: Reviewed  Last Vitals:  Vitals Value Taken Time  BP    Temp    Pulse    Resp    SpO2      Last Pain:  Vitals:   12/23/18 1009  TempSrc: Oral  PainSc: 0-No pain         Complications: No apparent anesthesia complications

## 2018-12-23 NOTE — Discharge Instructions (Signed)
Please discharge patient when stable, will follow up today with Dr. Dedrick Heffner at the [facility] office immediately following discharge.  Leave shield in place until visit.  All paperwork with discharge instructions will be given at the office. ° °

## 2018-12-23 NOTE — Op Note (Signed)
Date of procedure: 12/23/18  Pre-operative diagnosis: Visually significant age-related combined cataract, Left Eye (H25.812)  Post-operative diagnosis: Visually significant age-related combined cataract, Left Eye (H25.812)  Procedure: Removal of cataract via phacoemulsification and insertion of intra-ocular lens [employer] PCB00  +14.0D into the capsular bag of the Left Eye  Attending surgeon: Gerda Diss. Thayer Embleton, MD, MA  Anesthesia: MAC, Topical [place]  Complications: None  Estimated Blood Loss: <68m (minimal)  Specimens: None  Implants: As above  Indications:  Visually significant age-related cataract, Left Eye  Procedure:  The patient was seen and identified in the pre-operative area. The operative eye was identified and dilated.  The operative eye was marked.  Topical anesthesia was administered to the operative eye.     The patient was then to the operative suite and placed in the supine position.  A timeout was performed confirming the patient, procedure to be performed, and all other relevant information.   The patient's face was prepped and draped in the usual fashion for intra-ocular surgery.  A lid speculum was placed into the operative eye and the surgical microscope moved into place and focused.  An inferotemporal paracentesis was created using a 20 gauge paracentesis blade.  Shugarcaine was injected into the anterior chamber.  Viscoelastic was injected into the anterior chamber.  A temporal clear-corneal main wound incision was created using a 2.421mmicrokeratome.  A continuous curvilinear capsulorrhexis was initiated using an irrigating cystitome and completed using capsulorrhexis forceps.  Hydrodissection and hydrodeliniation were performed.  Viscoelastic was injected into the anterior chamber.  A phacoemulsification handpiece and a chopper as a second instrument were used to remove the nucleus and epinucleus. The irrigation/aspiration handpiece was used to remove  any remaining cortical material.   The capsular bag was reinflated with viscoelastic, checked, and found to be intact.  The intraocular lens was inserted into the capsular bag.  The irrigation/aspiration handpiece was used to remove any remaining viscoelastic.  The clear corneal wound and paracentesis wounds were then hydrated and checked with Weck-Cels to be watertight.  The lid-speculum and drape was removed, and the patient's face was cleaned with a wet and dry 4x4.  Maxitrol was instilled in the eye before a clear shield was taped over the eye. The patient was taken to the post-operative care unit in good condition, having tolerated the procedure well.  Post-Op Instructions: [facility] and will receive all other orders and instructions.

## 2018-12-23 NOTE — Anesthesia Preprocedure Evaluation (Addendum)
Anesthesia Evaluation  Patient identified by MRN, date of birth, ID band Patient awake    Reviewed: Allergy & Precautions, NPO status , Patient's Chart, lab work & pertinent test results  History of Anesthesia Complications (+) history of anesthetic complications  Airway Mallampati: III  TM Distance: >3 FB Neck ROM: Full    Dental  (+) Edentulous Lower, Edentulous Upper   Pulmonary sleep apnea , pneumonia,    Pulmonary exam normal        Cardiovascular hypertension, Normal cardiovascular exam+ Valvular Problems/Murmurs  Rhythm:Regular Rate:Normal  Impressions:  - Mild basal septal LV hypertrophy with LVEF 60-65% and grade 1   diastolic dysfunction. Mild to moderate mitral annular   calcification with trivial mitral regurgitation. Trivial   tricuspid regurgitation. EKG- SR,LAFB   Neuro/Psych PSYCHIATRIC DISORDERS Anxiety Depression  Neuromuscular disease    GI/Hepatic hiatal hernia, GERD  Medicated,  Endo/Other  diabetes, Well Controlled, Type 2Hypothyroidism   Renal/GU Renal disease     Musculoskeletal  (+) Arthritis , Fibromyalgia -  Abdominal   Peds  Hematology  (+) anemia ,   Anesthesia Other Findings   Reproductive/Obstetrics                           Anesthesia Physical Anesthesia Plan  ASA: III  Anesthesia Plan: MAC   Post-op Pain Management:    Induction:   PONV Risk Score and Plan:   Airway Management Planned: Nasal Cannula and Natural Airway  Additional Equipment:   Intra-op Plan:   Post-operative Plan:   Informed Consent: I have reviewed the patients History and Physical, chart, labs and discussed the procedure including the risks, benefits and alternatives for the proposed anesthesia with the patient or authorized representative who has indicated his/her understanding and acceptance.       Plan Discussed with:   Anesthesia Plan Comments:          Anesthesia Quick Evaluation

## 2018-12-26 ENCOUNTER — Encounter (HOSPITAL_COMMUNITY): Payer: Self-pay | Admitting: Ophthalmology

## 2018-12-28 ENCOUNTER — Other Ambulatory Visit: Payer: Self-pay

## 2018-12-28 ENCOUNTER — Encounter: Payer: Self-pay | Admitting: Orthopaedic Surgery

## 2018-12-28 ENCOUNTER — Ambulatory Visit (INDEPENDENT_AMBULATORY_CARE_PROVIDER_SITE_OTHER): Payer: Medicare Other | Admitting: Orthopaedic Surgery

## 2018-12-28 VITALS — BP 142/93 | HR 83 | Ht 61.0 in | Wt 250.0 lb

## 2018-12-28 DIAGNOSIS — M25562 Pain in left knee: Secondary | ICD-10-CM

## 2018-12-28 DIAGNOSIS — G8929 Other chronic pain: Secondary | ICD-10-CM | POA: Diagnosis not present

## 2018-12-28 MED ORDER — BUPIVACAINE HCL 0.5 % IJ SOLN
2.0000 mL | INTRAMUSCULAR | Status: AC | PRN
  Administered 2018-12-28: 2 mL via INTRA_ARTICULAR

## 2018-12-28 MED ORDER — LIDOCAINE HCL 1 % IJ SOLN
2.0000 mL | INTRAMUSCULAR | Status: AC | PRN
  Administered 2018-12-28: 2 mL

## 2018-12-28 MED ORDER — METHYLPREDNISOLONE ACETATE 40 MG/ML IJ SUSP
80.0000 mg | INTRAMUSCULAR | Status: AC | PRN
  Administered 2018-12-28: 80 mg via INTRA_ARTICULAR
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
## 2018-12-29 DIAGNOSIS — H25811 Combined forms of age-related cataract, right eye: Secondary | ICD-10-CM | POA: Diagnosis not present

## 2018-12-30 ENCOUNTER — Other Ambulatory Visit: Payer: Self-pay

## 2018-12-30 ENCOUNTER — Ambulatory Visit (INDEPENDENT_AMBULATORY_CARE_PROVIDER_SITE_OTHER): Payer: Medicare Other | Admitting: Cardiovascular Disease

## 2018-12-30 ENCOUNTER — Encounter: Payer: Self-pay | Admitting: Cardiovascular Disease

## 2018-12-30 ENCOUNTER — Telehealth: Payer: Self-pay | Admitting: Cardiovascular Disease

## 2018-12-30 VITALS — BP 130/88 | HR 76 | Ht 61.5 in | Wt 262.0 lb

## 2018-12-30 DIAGNOSIS — I10 Essential (primary) hypertension: Secondary | ICD-10-CM | POA: Diagnosis not present

## 2018-12-30 DIAGNOSIS — E785 Hyperlipidemia, unspecified: Secondary | ICD-10-CM | POA: Diagnosis not present

## 2018-12-30 DIAGNOSIS — Z23 Encounter for immunization: Secondary | ICD-10-CM | POA: Diagnosis not present

## 2018-12-30 DIAGNOSIS — R011 Cardiac murmur, unspecified: Secondary | ICD-10-CM | POA: Diagnosis not present

## 2018-12-30 NOTE — H&P (Signed)
Surgical History & Physical  Patient Name: Yesenia [name] DOB: 1950-10-10  Surgery: Cataract extraction with intraocular lens implant phacoemulsification; Right Eye  Surgeon: Baruch Goldmann MD Surgery Date:  01/06/2019 Pre-Op Date:  12/29/2018  HPI: A 89 Yr. old female patient 1. The patient is returning after cataract surgery. The left eye is affected. Status post cataract surgery, which began 1 week ago: Vision is very good and colors are vibrant. Vision is very dim and blurry OD. The sky looks gray with the right eye. TV is very blurry OD. HPI was performed by Baruch Goldmann .  Medical History: Myopia,Astigmatism Diabetes High Blood Pressure LDL Thyroid Problems  Review of Systems Negative Allergic/Immunologic Negative Cardiovascular Negative Constitutional Negative Ear, Nose, Mouth & Throat Negative Endocrine Negative Eyes Negative Gastrointestinal Negative Genitourinary Negative Hemotologic/Lymphatic Negative Integumentary Negative Musculoskeletal Negative Neurological Negative Psychiatry Negative Respiratory  Social   Never smoked  Medication Moxifloxacin, Ilevro, Prednisolone acetate 1%,  Victoza, Blood Pressure Medication, Cholesterol Medication, Alendronate, Buspirone, Dexilant, Duloxetine, Famotidine, Levothyroxine Sodium, Linzess, Trazodone, Lyrica, Vistoril,   Sx/Procedures Phaco c IOL,  Arm, wrist, shoulder, knee sx,   Drug Allergies   NKDA  History & Physical: Heent:  Cataract, Right eye NECK: supple without bruits LUNGS: lungs clear to auscultation CV: regular rate and rhythm Abdomen: soft and non-tender  Impression & Plan: Assessment: 1.  CATARACT EXTRACTION STATUS; Left Eye (Z98.42) 2.  NUCLEAR SCLEROSIS AGE RELATED; , Right Eye (H25.11)  Plan: 1.  1 week after cataract surgery. Doing well with improved vision and normal eye pressure. Call with any problems or concerns. Stop Vigamox. Continue Ilevro 1 drop 1x/day for 3 more weeks.  Continue Pred Acetate 1 drop 2x/day for 3 more weeks. 2.  High Myope. Cataract accounts for the patient's decreased vision. This visual impairment is not correctable with a tolerable change in glasses or contact lenses. Cataract surgery with an implantation of a new lens should significantly improve the visual and functional status of the patient. Discussed all risks, benefits, alternatives, and potential complications. Discussed the procedures and recovery. Patient desires to have surgery. A-scan ordered and performed today for intra-ocular lens calculations. The surgery will be performed in order to improve vision for driving, reading, and for eye examinations. Recommend phacoemulsification with intra-ocular lens. Right Eye. Surgery required to correct imbalance of vision. Dilates well - shugarcaine by protocol.

## 2018-12-30 NOTE — Telephone Encounter (Signed)
°  Precert needed for: 2 D Echo dx: cardiac murmur   Location:[facility]     Date: Feb 02, 2019

## 2018-12-30 NOTE — Patient Instructions (Addendum)
Medication Instructions:   Your physician recommends that you continue on your current medications as directed. Please refer to the Current Medication list given to you today.  Labwork:  NONE  Testing/Procedures: Your physician has requested that you have an echocardiogram. Echocardiography is a painless test that uses sound waves to create images of your heart. It provides your doctor with information about the size and shape of your heart and how well your heart's chambers and valves are working. This procedure takes approximately one hour. There are no restrictions for this procedure.  Follow-Up:  Your physician recommends that you schedule a follow-up appointment in: pending.  Any Other Special Instructions Will Be Listed Below (If Applicable).  If you need a refill on your cardiac medications before your next appointment, please call your pharmacy. 
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
## 2019-01-02 ENCOUNTER — Other Ambulatory Visit: Payer: Self-pay | Admitting: Family Medicine

## 2019-01-04 ENCOUNTER — Encounter (HOSPITAL_COMMUNITY): Payer: Self-pay

## 2019-01-04 ENCOUNTER — Other Ambulatory Visit (HOSPITAL_COMMUNITY)
Admission: RE | Admit: 2019-01-04 | Discharge: 2019-01-04 | Disposition: A | Discharge status: Home / Self Care | Payer: Medicare Other | Source: Ambulatory Visit | Attending: Ophthalmology | Admitting: Ophthalmology

## 2019-01-04 ENCOUNTER — Encounter (HOSPITAL_COMMUNITY)
Admission: RE | Admit: 2019-01-04 | Discharge: 2019-01-04 | Disposition: A | Discharge status: Home / Self Care | Payer: Medicare Other | Source: Ambulatory Visit | Attending: Ophthalmology | Admitting: Ophthalmology

## 2019-01-04 ENCOUNTER — Other Ambulatory Visit: Payer: Self-pay

## 2019-01-05 ENCOUNTER — Other Ambulatory Visit (HOSPITAL_COMMUNITY)
Admission: RE | Admit: 2019-01-05 | Discharge: 2019-01-05 | Disposition: A | Discharge status: Home / Self Care | Payer: Medicare Other | Source: Ambulatory Visit | Attending: Ophthalmology | Admitting: Ophthalmology

## 2019-01-05 DIAGNOSIS — Z20828 Contact with and (suspected) exposure to other viral communicable diseases: Secondary | ICD-10-CM | POA: Insufficient documentation

## 2019-01-05 DIAGNOSIS — R2681 Unsteadiness on feet: Secondary | ICD-10-CM | POA: Diagnosis not present

## 2019-01-05 LAB — SARS CORONAVIRUS 2 (TAT 6-24 HRS): SARS Coronavirus 2: NEGATIVE

## 2019-01-06 ENCOUNTER — Ambulatory Visit (HOSPITAL_COMMUNITY): Payer: Medicare Other | Admitting: Anesthesiology

## 2019-01-06 ENCOUNTER — Encounter (HOSPITAL_COMMUNITY)
Admission: RE | Disposition: A | Discharge status: Home / Self Care | Payer: Self-pay | Source: Home / Self Care | Attending: Ophthalmology

## 2019-01-06 ENCOUNTER — Encounter (HOSPITAL_COMMUNITY): Payer: Self-pay | Admitting: *Deleted

## 2019-01-06 ENCOUNTER — Ambulatory Visit (HOSPITAL_COMMUNITY)
Admission: RE | Admit: 2019-01-06 | Discharge: 2019-01-06 | Disposition: A | Discharge status: Home / Self Care | Payer: Medicare Other | Attending: Ophthalmology | Admitting: Ophthalmology

## 2019-01-06 DIAGNOSIS — E1136 Type 2 diabetes mellitus with diabetic cataract: Secondary | ICD-10-CM | POA: Insufficient documentation

## 2019-01-06 DIAGNOSIS — Z79890 Hormone replacement therapy (postmenopausal): Secondary | ICD-10-CM | POA: Insufficient documentation

## 2019-01-06 DIAGNOSIS — Z79899 Other long term (current) drug therapy: Secondary | ICD-10-CM | POA: Insufficient documentation

## 2019-01-06 DIAGNOSIS — F329 Major depressive disorder, single episode, unspecified: Secondary | ICD-10-CM | POA: Diagnosis not present

## 2019-01-06 DIAGNOSIS — H2511 Age-related nuclear cataract, right eye: Secondary | ICD-10-CM | POA: Diagnosis not present

## 2019-01-06 DIAGNOSIS — G709 Myoneural disorder, unspecified: Secondary | ICD-10-CM | POA: Diagnosis not present

## 2019-01-06 DIAGNOSIS — I10 Essential (primary) hypertension: Secondary | ICD-10-CM | POA: Diagnosis not present

## 2019-01-06 DIAGNOSIS — K219 Gastro-esophageal reflux disease without esophagitis: Secondary | ICD-10-CM | POA: Insufficient documentation

## 2019-01-06 DIAGNOSIS — E119 Type 2 diabetes mellitus without complications: Secondary | ICD-10-CM | POA: Diagnosis not present

## 2019-01-06 DIAGNOSIS — Z9842 Cataract extraction status, left eye: Secondary | ICD-10-CM | POA: Diagnosis not present

## 2019-01-06 DIAGNOSIS — G4730 Sleep apnea, unspecified: Secondary | ICD-10-CM | POA: Insufficient documentation

## 2019-01-06 DIAGNOSIS — F419 Anxiety disorder, unspecified: Secondary | ICD-10-CM | POA: Insufficient documentation

## 2019-01-06 DIAGNOSIS — K449 Diaphragmatic hernia without obstruction or gangrene: Secondary | ICD-10-CM | POA: Insufficient documentation

## 2019-01-06 DIAGNOSIS — H25811 Combined forms of age-related cataract, right eye: Secondary | ICD-10-CM | POA: Insufficient documentation

## 2019-01-06 HISTORY — PX: CATARACT EXTRACTION W/PHACO: SHX586

## 2019-01-06 LAB — GLUCOSE, CAPILLARY: Glucose-Capillary: 91 mg/dL (ref 70–99)

## 2019-01-06 SURGERY — PHACOEMULSIFICATION, CATARACT, WITH IOL INSERTION
Anesthesia: Monitor Anesthesia Care | Site: Eye | Laterality: Right

## 2019-01-06 MED ORDER — PROVISC 10 MG/ML IO SOLN
INTRAOCULAR | Status: DC | PRN
  Administered 2019-01-06: 0.85 mL via INTRAOCULAR

## 2019-01-06 MED ORDER — PHENYLEPHRINE HCL 2.5 % OP SOLN
1.0000 [drp] | OPHTHALMIC | Status: AC | PRN
  Administered 2019-01-06 (×3): 1 [drp] via OPHTHALMIC

## 2019-01-06 MED ORDER — PROMETHAZINE HCL 25 MG/ML IJ SOLN: 6.2500 mg | INTRAMUSCULAR | Status: DC | PRN

## 2019-01-06 MED ORDER — SODIUM HYALURONATE 23 MG/ML IO SOLN
INTRAOCULAR | Status: DC | PRN
  Administered 2019-01-06: 0.6 mL via INTRAOCULAR

## 2019-01-06 MED ORDER — LIDOCAINE HCL (PF) 1 % IJ SOLN
INTRAOCULAR | Status: DC | PRN
  Administered 2019-01-06: 09:00:00 1 mL via OPHTHALMIC

## 2019-01-06 MED ORDER — NEOMYCIN-POLYMYXIN-DEXAMETH 3.5-10000-0.1 OP SUSP
OPHTHALMIC | Status: DC | PRN
  Administered 2019-01-06: 1 [drp] via OPHTHALMIC

## 2019-01-06 MED ORDER — EPINEPHRINE PF 1 MG/ML IJ SOLN
INTRAOCULAR | Status: DC | PRN
  Administered 2019-01-06: 09:00:00 500 mL

## 2019-01-06 MED ORDER — LIDOCAINE HCL 3.5 % OP GEL
1.0000 "application " | Freq: Once | OPHTHALMIC | Status: AC
  Administered 2019-01-06: 1 via OPHTHALMIC

## 2019-01-06 MED ORDER — MIDAZOLAM HCL 2 MG/2ML IJ SOLN
INTRAMUSCULAR | Status: DC | PRN
  Administered 2019-01-06 (×2): 0.5 mg via INTRAVENOUS

## 2019-01-06 MED ORDER — MIDAZOLAM HCL 2 MG/2ML IJ SOLN: 0.5000 mg | Freq: Once | INTRAMUSCULAR | Status: DC | PRN

## 2019-01-06 MED ORDER — BSS IO SOLN
INTRAOCULAR | Status: DC | PRN
  Administered 2019-01-06: 15 mL via INTRAOCULAR

## 2019-01-06 MED ORDER — POVIDONE-IODINE 5 % OP SOLN
OPHTHALMIC | Status: DC | PRN
  Administered 2019-01-06: 1 via OPHTHALMIC

## 2019-01-06 MED ORDER — CYCLOPENTOLATE-PHENYLEPHRINE 0.2-1 % OP SOLN
1.0000 [drp] | OPHTHALMIC | Status: AC | PRN
  Administered 2019-01-06 (×3): 1 [drp] via OPHTHALMIC

## 2019-01-06 MED ORDER — MIDAZOLAM HCL 2 MG/2ML IJ SOLN
INTRAMUSCULAR | Status: AC
  Filled 2019-01-06: qty 2

## 2019-01-06 MED ORDER — TETRACAINE HCL 0.5 % OP SOLN
1.0000 [drp] | OPHTHALMIC | Status: AC | PRN
  Administered 2019-01-06 (×3): 1 [drp] via OPHTHALMIC

## 2019-01-06 SURGICAL SUPPLY — 13 items

## 2019-01-06 NOTE — Op Note (Signed)
Date of procedure: 01/06/19  Pre-operative diagnosis:  Visually significant combined form age-related cataract, Right Eye (H25.811)  Post-operative diagnosis:  Visually significant combined form age-related cataract, Right Eye (H25.811)  Procedure: Removal of cataract via phacoemulsification and insertion of intra-ocular lens Wynetta Emery and [place]  +15.0D into [employer] of the Right Eye  Attending surgeon: Gerda Diss. Caitlan Chauca, MD, MA  Anesthesia: MAC, Topical [place]  Complications: None  Estimated Blood Loss: <2m (minimal)  Specimens: None  Implants: As above  Indications:  Visually significant age-related cataract, Right Eye  Procedure:  The patient was seen and identified in the pre-operative area. The operative eye was identified and dilated.  The operative eye was marked.  Topical anesthesia was administered to the operative eye.     The patient was then to the operative suite and placed in the supine position.  A timeout was performed confirming the patient, procedure to be performed, and all other relevant information.   The patient's face was prepped and draped in the usual fashion for intra-ocular surgery.  A lid speculum was placed into the operative eye and the surgical microscope moved into place and focused.  A superotemporal paracentesis was created using a 20 gauge paracentesis blade.  Shugarcaine was injected into the anterior chamber.  Viscoelastic was injected into the anterior chamber.  A temporal clear-corneal main wound incision was created using a 2.428mmicrokeratome.  A continuous curvilinear capsulorrhexis was initiated using an irrigating cystitome and completed using capsulorrhexis forceps.  Hydrodissection and hydrodeliniation were performed.  Viscoelastic was injected into the anterior chamber.  A phacoemulsification handpiece and a chopper as a second instrument were used to remove the nucleus and epinucleus. The irrigation/aspiration handpiece was  used to remove any remaining cortical material.   The capsular bag was reinflated with viscoelastic, checked, and found to be intact.  The intraocular lens was inserted into the capsular bag.  The irrigation/aspiration handpiece was used to remove any remaining viscoelastic.  The clear corneal wound and paracentesis wounds were then hydrated and checked with Weck-Cels to be watertight.  The lid-speculum and drape was removed, and the patient's face was cleaned with a wet and dry 4x4.  Maxitrol was instilled in the eye before a clear shield was taped over the eye. The patient was taken to the post-operative care unit in good condition, having tolerated the procedure well.  Post-Op Instructions: [facility] and will receive all other orders and instructions.

## 2019-01-06 NOTE — Interval H&P Note (Signed)
History and Physical Interval Note: The H and P was reviewed and updated. The patient was examined.  No changes were found after exam.  The surgical eye was marked.  01/06/2019 8:22 AM  Yesenia [name]  has presented today for surgery, with the diagnosis of Nuclear sclerotic cataract - Right eye.  The various methods of treatment have been discussed with the patient and family. After consideration of risks, benefits and other options for treatment, the patient has consented to  Procedure(s): CATARACT EXTRACTION PHACO AND INTRAOCULAR LENS PLACEMENT RIGHT EYE (Right) as a surgical intervention.  The patient's history has been reviewed, patient examined, no change in status, stable for surgery.  I have reviewed the patient's chart and labs.  Questions were answered to the patient's satisfaction.     Baruch Goldmann

## 2019-01-06 NOTE — Anesthesia Preprocedure Evaluation (Signed)
Anesthesia Evaluation  Patient identified by MRN, date of birth, ID band Patient awake  General Assessment Comment:States Slow to awaken with GA Had first eye done 2 weeks ago-states was nervous during  Reviewed: Allergy & Precautions, NPO status , Patient's Chart, lab work & pertinent test results  History of Anesthesia Complications (+) AWARENESS UNDER ANESTHESIA and history of anesthetic complications  Airway Mallampati: II  TM Distance: >3 FB Neck ROM: Full    Dental no notable dental hx. (+) Edentulous Upper, Edentulous Lower   Pulmonary sleep apnea , pneumonia, resolved,    Pulmonary exam normal breath sounds clear to auscultation       Cardiovascular Exercise Tolerance: Poor hypertension, Pt. on medications negative cardio ROS Normal cardiovascular examII Rhythm:Regular Rate:Normal  Denies CP/MI- reports diminished ET related to orthopedic issues and LBP   Neuro/Psych PSYCHIATRIC DISORDERS Anxiety Depression  Neuromuscular disease    GI/Hepatic Neg liver ROS, hiatal hernia, GERD  Medicated and Controlled,  Endo/Other  diabetes, Type 2, Oral Hypoglycemic AgentsHypothyroidism   Renal/GU negative Renal ROS  negative genitourinary   Musculoskeletal negative musculoskeletal ROS (+)   Abdominal   Peds negative pediatric ROS (+)  Hematology negative hematology ROS (+)   Anesthesia Other Findings   Reproductive/Obstetrics negative OB ROS                             Anesthesia Physical Anesthesia Plan  ASA: III  Anesthesia Plan: MAC   Post-op Pain Management:    Induction: Intravenous  PONV Risk Score and Plan: 2 and Treatment may vary due to age or medical condition, Midazolam and Ondansetron  Airway Management Planned: Simple Face Mask and Nasal Cannula  Additional Equipment:   Intra-op Plan:   Post-operative Plan:   Informed Consent: I have reviewed the patients History  and Physical, chart, labs and discussed the procedure including the risks, benefits and alternatives for the proposed anesthesia with the patient or authorized representative who has indicated his/her understanding and acceptance.     Dental advisory given  Plan Discussed with: CRNA  Anesthesia Plan Comments: (Plan Full PPE use  Plan MAC as tolerated with GETA as needed d/w pt -WTP with same after Q&A)        Anesthesia Quick Evaluation

## 2019-01-06 NOTE — Anesthesia Postprocedure Evaluation (Signed)
Anesthesia Post Note  Patient: Yesenia [name]  Procedure(s) Performed: CATARACT EXTRACTION PHACO AND INTRAOCULAR LENS PLACEMENT RIGHT EYE (CDE: 18.57) (Right Eye)  Patient location during evaluation: Short Stay Anesthesia Type: MAC Level of consciousness: awake and alert Pain management: pain level controlled Vital Signs Assessment: post-procedure vital signs reviewed and stable Respiratory status: spontaneous breathing Cardiovascular status: stable Postop Assessment: no apparent nausea or vomiting Anesthetic complications: no     Last Vitals:  Vitals:   01/06/19 0841  BP: 136/81  Pulse: 78  Resp: 18  Temp: 36.8 C  SpO2: 96%    Last Pain:  Vitals:   01/06/19 0841  TempSrc: Oral  PainSc: 0-No pain                 Everette Rank

## 2019-01-06 NOTE — Discharge Instructions (Addendum)
Please discharge patient when stable, will follow up today with Dr. Wrzosek at the [facility] office immediately following discharge.  Leave shield in place until visit.  All paperwork with discharge instructions will be given at the office. ° ° ° ° °Monitored Anesthesia Care, Care After °These instructions provide you with information about caring for yourself after your procedure. Your health care provider may also give you more specific instructions. Your treatment has been planned according to current medical practices, but problems sometimes occur. Call your health care provider if you have any problems or questions after your procedure. °What can I expect after the procedure? °After your procedure, you may: °· Feel sleepy for several hours. °· Feel clumsy and have poor balance for several hours. °· Feel forgetful about what happened after the procedure. °· Have poor judgment for several hours. °· Feel nauseous or vomit. °· Have a sore throat if you had a breathing tube during the procedure. °Follow these instructions at home: °For at least 24 hours after the procedure: ° °  ° °· Have a responsible adult stay with you. It is important to have someone help care for you until you are awake and alert. °· Rest as needed. °· Do not: °? Participate in activities in which you could fall or become injured. °? Drive. °? Use heavy machinery. °? Drink alcohol. °? Take sleeping pills or medicines that cause drowsiness. °? Make important decisions or sign legal documents. °? Take care of children on your own. °Eating and drinking °· Follow the diet that is recommended by your health care provider. °· If you vomit, drink water, juice, or soup when you can drink without vomiting. °· Make sure you have little or no nausea before eating solid foods. °General instructions °· Take over-the-counter and prescription medicines only as told by your health care provider. °· If you have sleep apnea, surgery and certain  medicines can increase your risk for breathing problems. Follow instructions from your health care provider about wearing your sleep device: °? Anytime you are sleeping, including during daytime naps. °? While taking prescription pain medicines, sleeping medicines, or medicines that make you drowsy. °· If you smoke, do not smoke without supervision. °· Keep all follow-up visits as told by your health care provider. This is important. °Contact a health care provider if: °· You keep feeling nauseous or you keep vomiting. °· You feel light-headed. °· You develop a rash. °· You have a fever. °Get help right away if: °· You have trouble breathing. °Summary °· For several hours after your procedure, you may feel sleepy and have poor judgment. °· Have a responsible adult stay with you for at least 24 hours or until you are awake and alert. °This information is not intended to replace advice given to you by your health care provider. Make sure you discuss any questions you have with your health care provider. °Document Released: 07/14/2015 Document Revised: 06/21/2017 Document Reviewed: 07/14/2015 °Elsevier Patient Education © 2020 [employer] ° °

## 2019-01-06 NOTE — Transfer of Care (Signed)
Immediate Anesthesia Transfer of Care Note  Patient: Yesenia [name]  Procedure(s) Performed: CATARACT EXTRACTION PHACO AND INTRAOCULAR LENS PLACEMENT RIGHT EYE (CDE: 18.57) (Right Eye)  Patient Location: Short Stay  Anesthesia Type:MAC  Level of Consciousness: awake, alert , oriented and patient cooperative  Airway & Oxygen Therapy: Patient Spontanous Breathing  Post-op Assessment: Report given to RN and Post -op Vital signs reviewed and stable  Post vital signs: Reviewed and stable  Last Vitals:  Vitals Value Taken Time  BP    Temp    Pulse    Resp    SpO2      Last Pain:  Vitals:   01/06/19 0841  TempSrc: Oral  PainSc: 0-No pain         Complications: No apparent anesthesia complications

## 2019-01-09 ENCOUNTER — Encounter (HOSPITAL_COMMUNITY): Payer: Self-pay | Admitting: Ophthalmology

## 2019-01-18 ENCOUNTER — Other Ambulatory Visit (HOSPITAL_COMMUNITY): Payer: Self-pay | Admitting: Family Medicine

## 2019-01-18 DIAGNOSIS — Z1231 Encounter for screening mammogram for malignant neoplasm of breast: Secondary | ICD-10-CM

## 2019-02-02 ENCOUNTER — Other Ambulatory Visit: Payer: Medicare Other

## 2019-02-09 ENCOUNTER — Other Ambulatory Visit: Payer: Self-pay

## 2019-02-09 ENCOUNTER — Ambulatory Visit (HOSPITAL_COMMUNITY)
Admission: RE | Admit: 2019-02-09 | Discharge: 2019-02-09 | Disposition: A | Discharge status: Home / Self Care | Payer: Medicare Other | Source: Ambulatory Visit | Attending: Family Medicine | Admitting: Family Medicine

## 2019-02-09 DIAGNOSIS — Z1231 Encounter for screening mammogram for malignant neoplasm of breast: Secondary | ICD-10-CM | POA: Diagnosis not present

## 2019-02-15 ENCOUNTER — Other Ambulatory Visit: Payer: Self-pay | Admitting: Family Medicine

## 2019-03-06 ENCOUNTER — Encounter: Payer: Medicare Other | Admitting: Family Medicine

## 2019-03-15 ENCOUNTER — Other Ambulatory Visit: Payer: Medicare Other

## 2019-04-04 ENCOUNTER — Other Ambulatory Visit: Payer: Self-pay | Admitting: Family Medicine

## 2019-04-04 NOTE — Telephone Encounter (Signed)
Last refilled: 02/15/2019 Last office visit: 11/04/2018

## 2019-04-26 ENCOUNTER — Telehealth: Payer: Self-pay | Admitting: *Deleted

## 2019-04-26 NOTE — Telephone Encounter (Signed)
-----   Message from Chanda Busing sent at 04/26/2019  9:18 AM EST ----- Regarding: RE: echo SCHEDULED FOR 2/4 75PM. YOU WILL NEED TO CALL HER BECAUSE I DO NOT HAVE A PHONE UP HERE. ----- Message ----- From: Laurine Blazer, LPN Sent: [id number]   4:10 PM EST To: Chanda Busing Subject: echo                                           Please reschedule her Echo for a Wednesday afternoon or Thursday anytime.  She is ready to do it now.   Let me know if I need to do a new order for you or if you can go off of the cancelled order ??  Thanks,  Edd Fabian

## 2019-04-26 NOTE — Telephone Encounter (Signed)
Left message to return call 

## 2019-05-01 NOTE — Telephone Encounter (Signed)
Left message to return call 

## 2019-05-04 NOTE — Telephone Encounter (Signed)
Son Jennefer Bravo [facility]) notified & verbalized understanding.  He provides her transportation & will call back if time does not work out for him.

## 2019-05-11 ENCOUNTER — Other Ambulatory Visit: Payer: Self-pay

## 2019-05-11 ENCOUNTER — Ambulatory Visit (INDEPENDENT_AMBULATORY_CARE_PROVIDER_SITE_OTHER): Payer: Medicare Other

## 2019-05-11 ENCOUNTER — Other Ambulatory Visit: Payer: Self-pay | Admitting: Family Medicine

## 2019-05-11 DIAGNOSIS — R011 Cardiac murmur, unspecified: Secondary | ICD-10-CM | POA: Diagnosis not present

## 2019-05-12 ENCOUNTER — Telehealth: Payer: Self-pay | Admitting: *Deleted

## 2019-05-12 NOTE — Telephone Encounter (Signed)
Laurine Blazer, [place]  [phone] PM EST    Left message to return call.

## 2019-05-12 NOTE — Telephone Encounter (Signed)
-----   Message from Herminio Commons, MD sent at 05/12/2019 10:00 AM EST ----- Normal pumping function

## 2019-05-12 NOTE — Telephone Encounter (Signed)
Yesenia [name], [place]  [phone] PM EST    Patient notified. Copy to pmd.

## 2019-06-21 ENCOUNTER — Other Ambulatory Visit: Payer: Self-pay | Admitting: Family Medicine

## 2019-06-23 ENCOUNTER — Other Ambulatory Visit: Payer: Self-pay

## 2019-06-23 NOTE — Patient Outreach (Signed)
[facility]) Care Management  06/23/2019  Yesenia ANGELETTI Oct 03, 1950 [id number]   Medication Adherence call to [name] Compliant Voice message left with a call back number. [name] is showing past due on Simvastatin 10 mg under [place].   [facility] Management Direct Dial [phone]  Fax [phone] [name]

## 2019-08-03 ENCOUNTER — Telehealth: Payer: Self-pay | Admitting: Family Medicine

## 2019-08-03 NOTE — Chronic Care Management (AMB) (Signed)
  Chronic Care Management   Note  08/03/2019 Name: Yesenia [name] MRN: [id number] DOB: 05-12-1950  Yesenia [name] is a 69 y.o. year old female who is a primary care patient of [place] [employer], Modena Nunnery, MD. I reached out to [name] by phone today in response to a referral sent by [name] PCP, Buelah Manis, Modena Nunnery, MD.   [name] was given information about Chronic Care Management services today including:  1. CCM service includes personalized support from designated clinical staff supervised by her physician, including individualized plan of care and coordination with other care providers 2. 24/7 contact phone numbers for assistance for urgent and routine care needs. 3. Service will only be billed when office clinical staff spend 20 minutes or more in a month to coordinate care. 4. Only one practitioner may furnish and bill the service in a calendar month. 5. The patient may stop CCM services at any time (effective at the end of the month) by phone call to the office staff.   Patient agreed to services and verbal consent obtained.   Follow up plan:   [place]

## 2019-08-22 ENCOUNTER — Telehealth: Payer: Self-pay | Admitting: Family Medicine

## 2019-08-22 NOTE — Progress Notes (Signed)
  Chronic Care Management   Outreach Note  08/22/2019 Name: Yesenia [name] MRN: [id number] DOB: 05-30-1950  Referred by: [name], MD Reason for referral : No chief complaint on file.   A second unsuccessful telephone outreach was attempted today. The patient was referred to pharmacist for assistance with care management and care coordination.  Follow Up Plan:   Prathima Ghanta [employer] Scheduler

## 2019-10-11 ENCOUNTER — Other Ambulatory Visit: Payer: Self-pay | Admitting: Family Medicine

## 2019-10-20 ENCOUNTER — Other Ambulatory Visit: Payer: Self-pay | Admitting: Family Medicine

## 2019-10-20 NOTE — Telephone Encounter (Signed)
Ok to refill??  Last office visit 11/04/2018.  Last refill 04/04/2019, #2 refills.

## 2019-10-20 NOTE — Telephone Encounter (Signed)
PT NEEDS OV, will give 1 month only

## 2019-11-08 ENCOUNTER — Ambulatory Visit: Payer: Medicare Other | Admitting: Family Medicine

## 2019-11-13 ENCOUNTER — Other Ambulatory Visit: Payer: Self-pay

## 2019-11-13 ENCOUNTER — Other Ambulatory Visit: Payer: Self-pay | Admitting: Family Medicine

## 2019-11-13 MED ORDER — PREGABALIN 150 MG PO CAPS: ORAL_CAPSULE | ORAL | 0 refills | Status: DC

## 2019-11-14 ENCOUNTER — Ambulatory Visit: Payer: Medicare Other | Admitting: Family Medicine

## 2019-11-17 ENCOUNTER — Ambulatory Visit: Payer: Medicare Other | Admitting: Family Medicine

## 2019-12-13 ENCOUNTER — Other Ambulatory Visit: Payer: Self-pay | Admitting: Family Medicine

## 2020-01-11 ENCOUNTER — Other Ambulatory Visit: Payer: Self-pay | Admitting: Family Medicine

## 2020-01-11 NOTE — Telephone Encounter (Signed)
Ok to refill??  Last office visit 11/04/2018  Last refill 11/13/2019.  Of note, patient is scheduled on 01/16/2020 for OV.

## 2020-01-16 ENCOUNTER — Other Ambulatory Visit: Payer: Self-pay

## 2020-01-16 ENCOUNTER — Encounter: Payer: Self-pay | Admitting: Family Medicine

## 2020-01-16 ENCOUNTER — Ambulatory Visit (INDEPENDENT_AMBULATORY_CARE_PROVIDER_SITE_OTHER): Payer: Medicare Other | Admitting: Family Medicine

## 2020-01-16 VITALS — BP 130/68 | HR 82 | Temp 98.5°F | Resp 16 | Ht 61.0 in | Wt 256.0 lb

## 2020-01-16 DIAGNOSIS — E1149 Type 2 diabetes mellitus with other diabetic neurological complication: Secondary | ICD-10-CM

## 2020-01-16 DIAGNOSIS — I10 Essential (primary) hypertension: Secondary | ICD-10-CM | POA: Diagnosis not present

## 2020-01-16 DIAGNOSIS — E038 Other specified hypothyroidism: Secondary | ICD-10-CM

## 2020-01-16 DIAGNOSIS — K5900 Constipation, unspecified: Secondary | ICD-10-CM

## 2020-01-16 DIAGNOSIS — Z1231 Encounter for screening mammogram for malignant neoplasm of breast: Secondary | ICD-10-CM

## 2020-01-16 DIAGNOSIS — M810 Age-related osteoporosis without current pathological fracture: Secondary | ICD-10-CM

## 2020-01-16 DIAGNOSIS — Z23 Encounter for immunization: Secondary | ICD-10-CM

## 2020-01-16 DIAGNOSIS — E66813 Obesity, class 3: Secondary | ICD-10-CM

## 2020-01-16 DIAGNOSIS — E1143 Type 2 diabetes mellitus with diabetic autonomic (poly)neuropathy: Secondary | ICD-10-CM

## 2020-01-16 DIAGNOSIS — I5189 Other ill-defined heart diseases: Secondary | ICD-10-CM

## 2020-01-16 DIAGNOSIS — N182 Chronic kidney disease, stage 2 (mild): Secondary | ICD-10-CM

## 2020-01-16 DIAGNOSIS — Z1211 Encounter for screening for malignant neoplasm of colon: Secondary | ICD-10-CM | POA: Diagnosis not present

## 2020-01-16 DIAGNOSIS — R296 Repeated falls: Secondary | ICD-10-CM

## 2020-01-16 DIAGNOSIS — E669 Obesity, unspecified: Secondary | ICD-10-CM

## 2020-01-16 DIAGNOSIS — K760 Fatty (change of) liver, not elsewhere classified: Secondary | ICD-10-CM

## 2020-01-16 MED ORDER — VITAMIN D 25 MCG (1000 UNIT) PO TABS: 1000.0000 [IU] | ORAL_TABLET | Freq: Every day | ORAL | 1 refills | Status: DC

## 2020-01-16 NOTE — Progress Notes (Signed)
Subjective:    Patient ID: Yesenia [name], female    DOB: Aug 28, 1950, 69 y.o.   MRN: [id number]  Patient presents for Follow-up (is fasting)    Pt here to f/u chronic medical problems , medications reviewed   Her last visit wasa in July 2020   She seen by Orthopedics , she was seen by Dr. Durward Fortes, given rollator  She has had multiple falls , they recommended she see a neurologist. I dont have any records regarding any particular concerns. She states she feels her balance isnt good  DM- She states she check CBG it has been low  100's , diet controlled   HTN- she does check BP at home  Chronic neuropathty taking lyrica three times a day   Hypothyroidism- taking levothyroxine 181mcg daily  MDD- taking cybalta, buspar , takes trazodone for sleep   Constipation- taking linzess dialy   Peripheraal edema taking lasix   Osteoporosis- taking fosamax once a week, she has not been taking vitamin D   Immunizations Due for flu s hot   Due for colonoscopy, history of polyps   Review Of Systems:  GEN- denies fatigue, fever, weight loss,weakness, recent illness HEENT- denies eye drainage, change in vision, nasal discharge, CVS- denies chest pain, palpitations RESP- denies SOB, cough, wheeze ABD- denies N/V, change in stools, abd pain GU- denies dysuria, hematuria, dribbling, incontinence MSK- + joint pain, muscle aches, injury Neuro- denies headache, dizziness, syncope, seizure activity       Objective:    BP 130/68   Pulse 82   Temp 98.5 F (36.9 C) (Temporal)   Resp 16   Ht 5\' 1"  (1.549 m)   Wt 256 lb (116.1 kg)   SpO2 94%   BMI 48.37 kg/m  GEN- NAD, alert and oriented x3 HEENT- PERRL, EOMI, non injected sclera, pink conjunctiva, MMM, oropharynx clear Neck- Supple, no thyromegaly CVS- RRR  systolic murmur RESP-CTAB ABD-NABS,soft,NT,ND EXT- traceankle  edema Pulses- Radial, DP- 2+        Assessment & Plan:      Problem List Items Addressed This Visit       Unprioritized   CKD (chronic kidney disease), stage II   Class 3 obesity   Constipation    She continues to benefit from [place] will continue.  She is due for colonoscopy so she will be referred      Diabetic neuropathy [facility])    She asked to increase Lyrica.  With her already having unstable balance and history of falls would not recommend increasing the Lyrica at this time.      Diastolic dysfunction    Currently compensated.  She is taking Lasix daily.      Essential hypertension, benign    Controlled no changes to medications today.  We will check her renal function.      Fatty liver   Hypothyroidism   Relevant Orders   TSH (Completed)   Osteoporosis   Relevant Medications   cholecalciferol (VITAMIN D3) 25 MCG (1000 UNIT) tablet   Other Relevant Orders   DG Bone Density   Type II diabetes mellitus with neurological manifestations (HCC)    Type 2 diabetes with diabetic neuropathy.  She is currently diet controlled.  Goal is A1c less than 7%.      Relevant Orders   CBC with Differential/Platelet (Completed)   Comprehensive metabolic panel (Completed)   HM DIABETES FOOT EXAM (Completed)   Microalbumin / creatinine urine ratio (Completed)   Lipid panel (Completed)  Hemoglobin A1c (Completed)    Other Visit Diagnoses    Colon cancer screening    -  Primary   Relevant Orders   Ambulatory referral to Gastroenterology   Encounter for screening mammogram for malignant neoplasm of breast       Relevant Orders   MM 3D SCREEN BREAST BILATERAL   Recurrent falls       Likely MTF, check labs, pt has not been seen > 1 year, referarl placed, she also had underlying Spinal stenosis, neuroapthy that can contribute to falls    Relevant Orders   Ambulatory referral to Neurology   Need for immunization against influenza       Relevant Orders   Flu Vaccine QUAD High Dose(Fluad) (Completed)      Note: This dictation was prepared with Dragon dictation along with smaller  phrase technology. Any transcriptional errors that result from this process are unintentional.

## 2020-01-16 NOTE — Patient Instructions (Signed)
Schedule your bone density and mammogram Vitamin D sent to pharmacy Referral to neurology Referral to GI for colonoscopy We will call with lab results F/U 4 months

## 2020-01-17 ENCOUNTER — Encounter: Payer: Self-pay | Admitting: Family Medicine

## 2020-01-17 LAB — COMPREHENSIVE METABOLIC PANEL
AG Ratio: 1.2 (calc) (ref 1.0–2.5)
ALT: 6 U/L (ref 6–29)
AST: 14 U/L (ref 10–35)
Albumin: 3.9 g/dL (ref 3.6–5.1)
Alkaline phosphatase (APISO): 68 U/L (ref 37–153)
BUN/Creatinine Ratio: 17 (calc) (ref 6–22)
BUN: 19 mg/dL (ref 7–25)
CO2: 27 mmol/L (ref 20–32)
Calcium: 10.5 mg/dL — ABNORMAL HIGH (ref 8.6–10.4)
Chloride: 103 mmol/L (ref 98–110)
Creat: 1.11 mg/dL — ABNORMAL HIGH (ref 0.50–0.99)
Globulin: 3.3 g/dL (calc) (ref 1.9–3.7)
Glucose, Bld: 111 mg/dL — ABNORMAL HIGH (ref 65–99)
Potassium: 4.5 mmol/L (ref 3.5–5.3)
Sodium: 140 mmol/L (ref 135–146)
Total Bilirubin: 0.4 mg/dL (ref 0.2–1.2)
Total Protein: 7.2 g/dL (ref 6.1–8.1)

## 2020-01-17 LAB — LIPID PANEL
Cholesterol: 146 mg/dL (ref <200)
HDL: 60 mg/dL (ref >=50)
LDL Cholesterol (Calc): 67 mg/dL (calc)
Non-HDL Cholesterol (Calc): 86 mg/dL (calc) (ref <130)
Total CHOL/HDL Ratio: 2.4 (calc) (ref <5.0)
Triglycerides: 105 mg/dL (ref <150)

## 2020-01-17 LAB — CBC WITH DIFFERENTIAL/PLATELET
Absolute Monocytes: 447 cells/uL (ref 200–950)
Basophils Absolute: 78 cells/uL (ref 0–200)
Basophils Relative: 1.5 %
Eosinophils Absolute: 400 cells/uL (ref 15–500)
Eosinophils Relative: 7.7 %
HCT: 39.3 % (ref 35.0–45.0)
Hemoglobin: 11.3 g/dL — ABNORMAL LOW (ref 11.7–15.5)
Lymphs Abs: 1856 cells/uL (ref 850–3900)
MCH: 22.7 pg — ABNORMAL LOW (ref 27.0–33.0)
MCHC: 28.8 g/dL — ABNORMAL LOW (ref 32.0–36.0)
MCV: 79.1 fL — ABNORMAL LOW (ref 80.0–100.0)
MPV: 10.4 fL (ref 7.5–12.5)
Monocytes Relative: 8.6 %
Neutro Abs: 2418 cells/uL (ref 1500–7800)
Neutrophils Relative %: 46.5 %
Platelets: 374 10*3/uL (ref 140–400)
RBC: 4.97 10*6/uL (ref 3.80–5.10)
RDW: 18 % — ABNORMAL HIGH (ref 11.0–15.0)
Total Lymphocyte: 35.7 %
WBC: 5.2 10*3/uL (ref 3.8–10.8)

## 2020-01-17 LAB — HEMOGLOBIN A1C
Hgb A1c MFr Bld: 6.2 % of total Hgb — ABNORMAL HIGH (ref <5.7)
Mean Plasma Glucose: 131 (calc)
eAG (mmol/L): 7.3 (calc)

## 2020-01-17 LAB — MICROALBUMIN / CREATININE URINE RATIO
Creatinine, Urine: 191 mg/dL (ref 20–275)
Microalb Creat Ratio: 5 mcg/mg creat (ref <30)
Microalb, Ur: 1 mg/dL

## 2020-01-17 LAB — TSH: TSH: 3.78 mIU/L (ref 0.40–4.50)

## 2020-01-17 MED ORDER — PREGABALIN 150 MG PO CAPS
ORAL_CAPSULE | ORAL | 2 refills | Status: DC
Start: 2020-01-17 — End: 2020-04-04

## 2020-01-17 NOTE — Assessment & Plan Note (Signed)
Type 2 diabetes with diabetic neuropathy.  She is currently diet controlled.  Goal is A1c less than 7%.

## 2020-01-17 NOTE — Assessment & Plan Note (Signed)
Controlled no changes to medications today.  We will check her renal function.

## 2020-01-17 NOTE — Assessment & Plan Note (Signed)
She asked to increase Lyrica.  With her already having unstable balance and history of falls would not recommend increasing the Lyrica at this time.

## 2020-01-17 NOTE — Assessment & Plan Note (Signed)
She continues to benefit from [place] will continue.  She is due for colonoscopy so she will be referred

## 2020-01-17 NOTE — Assessment & Plan Note (Signed)
Currently compensated.  She is taking Lasix daily.

## 2020-01-18 ENCOUNTER — Encounter (INDEPENDENT_AMBULATORY_CARE_PROVIDER_SITE_OTHER): Payer: Self-pay | Admitting: *Deleted

## 2020-01-23 ENCOUNTER — Encounter: Payer: Self-pay | Admitting: Internal Medicine

## 2020-01-23 ENCOUNTER — Other Ambulatory Visit: Payer: Self-pay | Admitting: *Deleted

## 2020-01-23 MED ORDER — BLOOD GLUCOSE SYSTEM PAK KIT: PACK | 1 refills | Status: AC

## 2020-01-23 MED ORDER — BLOOD GLUCOSE TEST VI STRP: ORAL_STRIP | 3 refills | Status: DC

## 2020-01-23 MED ORDER — LANCETS MISC: 3 refills | Status: DC

## 2020-02-06 DIAGNOSIS — Z62820 Parent-biological child conflict: Secondary | ICD-10-CM | POA: Diagnosis not present

## 2020-02-07 ENCOUNTER — Telehealth: Payer: Self-pay | Admitting: Family Medicine

## 2020-02-07 NOTE — Progress Notes (Signed)
  Chronic Care Management   Note  02/07/2020 Name: Yesenia [name] MRN: [id number] DOB: 18-May-1950  Yesenia [name] is a 69 y.o. year old female who is a primary care patient of [place], Modena Nunnery, MD. I reached out to [name] by phone today in response to a referral sent by [name] PCP, Buelah Manis, Modena Nunnery, MD.   [name] was given information about Chronic Care Management services today including:  1. CCM service includes personalized support from designated clinical staff supervised by her physician, including individualized plan of care and coordination with other care providers 2. 24/7 contact phone numbers for assistance for urgent and routine care needs. 3. Service will only be billed when office clinical staff spend 20 minutes or more in a month to coordinate care. 4. Only one practitioner may furnish and bill the service in a calendar month. 5. The patient may stop CCM services at any time (effective at the end of the month) by phone call to the office staff.   Patient agreed to services and verbal consent obtained.   Follow up plan:   Carley Perdue [employer] Scheduler

## 2020-02-08 ENCOUNTER — Other Ambulatory Visit: Payer: Self-pay | Admitting: Family Medicine

## 2020-02-08 NOTE — Telephone Encounter (Signed)
Please Advise

## 2020-02-19 NOTE — Chronic Care Management (AMB) (Signed)
Chronic Care Management Pharmacy  Name: JAMAL HASKIN  MRN: [id number] DOB: Apr 05, 1951  Chief Complaint/ HPI  [name],  69 y.o. , female presents for their Initial CCM visit with the clinical pharmacist In office.  PCP : [name], MD  Their chronic conditions include: HTN, DVT/PE, GERD, Hypothyroidism, Type II DM w/ neuropathy and CKD, HLD, Anxiety/Depression, Osteoporosis.  Office Visits: 01/31/2020 (Lab results) - Calcium was a little elevated recommended to d/c any supplements, A1c up to 6.2.  01/16/2020 [facility]) - chronic medical problem follow up.  Patient states that she feels her balance is not good.  Recommended bone density scan and mammogram.  Referral to neurology sent, patient asked to increase gabapentin, however with her current instability this was not recommended.  Consult Visit: none recent  Medications: Outpatient Encounter Medications as of 02/21/2020  Medication Sig  . alendronate (FOSAMAX) 70 MG tablet TAKE 1 TABLET BY MOUTH ONCE WEEKLY. TAKE WITH FULL GLASS OF WATER ON AN EMPTY STOMACH.  [name] Blood Glucose Monitoring Suppl (BLOOD GLUCOSE SYSTEM PAK) KIT Please dispense as Accu Chek Guide. USE TO TEST BLOOD SUGAR 3 TIMES DAILY. Dx: E11.65.  . busPIRone (BUSPAR) 10 MG tablet TAKE 1 TABLET BY MOUTH 3 TIMES DAILY.  . cholecalciferol (VITAMIN D3) 25 MCG (1000 UNIT) tablet Take 1 tablet (1,000 Units total) by mouth daily.  [name] DEXILANT 60 MG capsule TAKE 1 CAPSULE BY MOUTH ONCE A DAY.  . DULoxetine (CYMBALTA) 60 MG capsule TAKE 1 CAPSULE BY MOUTH EVERY MORNING.  . famotidine (PEPCID) 20 MG tablet TAKE 1 TABLET TWICE DAILY AS NEEDED FOR HEARTBURN/INDIGESTION.  . furosemide (LASIX) 40 MG tablet TAKE (1) TABLET BY MOUTH ONCE DAILY AS NEEDED.  [name] Glucose Blood (BLOOD GLUCOSE TEST STRIPS) STRP Please dispense as Accu Chek Guide. USE TO TEST BLOOD SUGAR 3 TIMES DAILY. Dx: E11.65  . Lancet Devices MISC Please dispense based on patient and insurance  preference. Monitor FSBS 3x daily. Dx: E11.65  . Lancets MISC Please dispense as Accu Chek Guide. USE TO TEST BLOOD SUGAR 3 TIMES DAILY. Dx: E11.65.  [name] levothyroxine (SYNTHROID) 150 MCG tablet TAKE (1) TABLET BY MOUTH ONCE DAILY BEFORE BREAKFAST.  [name] LINZESS 72 MCG capsule TAKE 1 CAPSULE BY MOUTH ONCE DAILY BEFORE BREAKFAST.  [name] pregabalin (LYRICA) 150 MG capsule TAKE 1 CAPSULE BY MOUTH THREE TIMES DAILY.**ALWAYS IN BOTTLE**  . simvastatin (ZOCOR) 10 MG tablet TAKE 1 TABLET BY MOUTH AT BEDTIME.  . traZODone (DESYREL) 100 MG tablet TAKE 1&1/2 TABLETS DAILY AT BEDTIME FOR SLEEP AND MOOD   No facility-administered encounter medications on file as of 02/21/2020.     Current Diagnosis/Assessment:   Emergency planning/management officer Strain: Low Risk   . Difficulty of Paying Living Expenses: Not very hard    Goals Addressed            This Visit's Progress   . Pharmacy Care Plan:       CARE PLAN ENTRY (see longitudinal plan of care for additional care plan information)  Current Barriers:  . Chronic Disease Management support, education, and care coordination needs related to Hypertension, Hyperlipidemia, Diabetes, and Depression   Hypertension BP Readings from Last 3 Encounters:  01/16/20 130/68  01/06/19 130/77  12/30/18 130/88   . Pharmacist Clinical Goal(s): o Over the next 180 days, patient will work with PharmD and providers to maintain BP goal <130/80 . Current regimen:  o None . Interventions: o Recommended continued physical exercise (walking) o Provided chair exercise handout in  AVS . Patient self care activities - Over the next 180 days, patient will: o Check BP periodically, document, and provide at future appointments o Ensure daily salt intake < 2300 mg/day  Hyperlipidemia Lab Results  Component Value Date/Time   LDLCALC 67 01/16/2020 12:02 PM   LDLDIRECT 131 (H) 09/06/2013 12:53 PM   . Pharmacist Clinical Goal(s): o Over the next 180 days, patient will work with PharmD  and providers to maintain LDL goal < 70 . Current regimen:  o Simvastatin 55m . Interventions: o Counseled on most recent lipid panel and meaning of each measure o Evaluated tolerance of medication regimen . Patient self care activities - Over the next 180 days, patient will: o Continue to focus on medication adherence o Work on adding chair exercises to current activity plan  Diabetes w/ neuropathy Lab Results  Component Value Date/Time   HGBA1C 6.2 (H) 01/16/2020 12:02 PM   HGBA1C 5.9 (H) 11/04/2018 10:31 AM   HGBA1C 5.8 (H) 06/06/2013 10:55 AM   . Pharmacist Clinical Goal(s): o Over the next 180 days, patient will work with PharmD and providers to maintain A1c goal <7% . Current regimen:  o Pregabalin 1535mthree times per day . Interventions: o Reviewed current symptom level o Dicussed most recent A1c o Evaluated home blood sugar readings . Patient self care activities - Over the next 180 days, patient will: o Check blood sugar once daily, document, and provide at future appointments o Contact provider with any episodes of hyperglycemia or symptoms o Work on adding physical activity such as chair exercises  Depression . Pharmacist Clinical Goal(s) o Over the next 180 days, patient will work with PharmD and providers to minimize symptoms of depression. . Current regimen:  . Duloxetine 6076mam . Trazodone 100m72mBuspirone 10mg9m . Interventions: o Encouraged continued participation in group at living complex o Discussed sleep patterns Patient self care activities - Over the next 180 days, patient will: o Continue to participate in her walking group at complex o Notify providers of any new or worsening symptoms    Initial goal documentation        Hypertension   BP goal is:  <130/80  Office blood pressures are  BP Readings from Last 3 Encounters:  01/16/20 130/68  01/06/19 130/77  12/30/18 130/88   Patient checks BP at home infrequently Patient  home BP readings are ranging: N/A  Patient has failed these meds in the past: none noted Patient is currently controlled on the following medications:  . No medications at this time  We discussed   No BP logs available  She denies headaches, does report some dizziness when she moves her head position rapidly or goes from sitting to standing.  Counseled to go from sitting to standing slowly and do not rapidly adjust head position.  She denies recent falls.  Exercise - she has a friend group at her apt complex that she walks with and they help keep her motivated.  Interested in chair exercise she can do at home, will send with AVS.  Plan  Continue current medications     Diabetes w/ neuropathy   A1c goal <7%  Recent Relevant Labs: Lab Results  Component Value Date/Time   HGBA1C 6.2 (H) 01/16/2020 12:02 PM   HGBA1C 5.9 (H) 11/04/2018 10:31 AM   HGBA1C 5.8 (H) 06/06/2013 10:55 AM   MICROALBUR 1.0 01/16/2020 12:02 PM   MICROALBUR 0.3 11/04/2018 10:31 AM    Last diabetic Eye exam:  Lab Results  Component Value Date/Time   HMDIABEYEEXA No Retinopathy 09/16/2018 12:00 AM    Last diabetic Foot exam: No results found for: HMDIABFOOTEX   Checking BG: Daily  Recent FBG Readings: 111-115  Patient has failed these meds in past: none noted Patient is currently controlled on the following medications: . pregabalin 165m tid  We discussed:   A1c well controlled she does not have any hypo/hyperglycemia symptoms.  Discussed weight loss - patient interested in chair exercises and I will provide them to her.  She does well monitoring daily.  Working on portion control with diet.  Plan  Continue current medications  Chair exercises provided with AVS Hyperlipidemia   LDL goal < 70  Last lipids Lab Results  Component Value Date   CHOL 146 01/16/2020   HDL 60 01/16/2020   LDLCALC 67 01/16/2020   LDLDIRECT 131 (H) 09/06/2013   TRIG 105 01/16/2020   CHOLHDL 2.4  01/16/2020   Hepatic Function Latest Ref Rng & Units 01/16/2020 11/04/2018 09/13/2017  Total Protein 6.1 - 8.1 g/dL 7.2 6.8 6.8  Albumin 3.5 - 5.0 g/dL - - -  AST 10 - 35 U/L _0 ALT 6 - 29 U/L _1 Alk Phosphatase 38 - 126 U/L - - -  Total Bilirubin 0.2 - 1.2 mg/dL 0.4 0.6 0.3     The 10-year ASCVD risk score (Mikey BussingDC Jr., et al., 2013) is: 19%   Values used to calculate the score:     Age: [age]years     Sex: Female     Is Non-Hispanic African American: No     Diabetic: Yes     Tobacco smoker: No     Systolic Blood Pressure: 1333mmHg     Is BP treated: Yes     HDL Cholesterol: 60 mg/dL     Total Cholesterol: 146 mg/dL   Patient has failed these meds in past: none noted Patient is currently controlled on the following medications:  . Simvastatin 19m We discussed:    Tolerates statin well, denies myalgias  Lipid panel is very well controlled at last OV  Low intensity statin, diabetic patient may benefit from moderate intensity based on guidelines.  Plan  Continue current medications  Continue to follow to see if moderate intensity statin is needed Osteopenia / Osteoporosis   Last DEXA Scan: 03/21/2016  T-Score femoral neck: -3.5   No results found for: VD25OH   Patient is a candidate for pharmacologic treatment due to T-Score < -2.5 in femoral neck  Patient has failed these meds in past: none noted Patient is currently controlled on the following medications:  . Alendronate 7097meekly - on mondays . Vitamin D3 1000 unit  We discussed:    Counseled on oral bisphosphonate administration: take in the morning, 30 minutes prior to food with 6-8 oz of water. Do not lie down for at least 30 minutes after taking.   Encouraged weight bearing exercise, chair exercises provided.  Recommend repeat DEXA scan  Plan  Continue current medications  Recommend repeat DEXA scan Hypothyroidism   Lab Results  Component Value Date/Time   TSH 3.78 01/16/2020 12:08  PM   TSH 8.74 (H) 11/04/2018 10:31 AM   FREET4 1.2 01/11/2017 04:40 PM   FREET4 2.0 (H) 09/18/2016 11:41 AM    Patient has failed these meds in past: none noted Patient is currently controlled on the following medications:  . Levothyroxine 150m46mWe discussed:  TSH controlled  No symptoms  Plan  Continue current medications Depression/Anxiety   Patient has failed these meds in past: none noted Patient is currently controlled on the following medications:  . Duloxetine 39m qam . Trazodone 1024m. Buspirone 1013mid  We discussed:   Patient reports mood and sleep have been good lately  She really benefits from the groups in her complex keeping her mind going and mood up Plan  Continue current medications DVT/PE   Patient has failed these meds in past: none noted Patient is currently controlled on the following medications:  . None currently  We discussed:    She is aware of signs/symptoms of DVT/PE  Denies chest pain or SOB  She does have swelling but it resolves with Lasix  Counseled to watch for redness or swelling that does not resolve with Lasix  Plan  Continue current medications GERD   Patient has failed these meds in past: none noted Patient is currently controlled on the following medications:  . Famotidine 69m14mDexilant 60mg55m discussed:    Counseled on indication of each medication and how they work  Discussed avoiding trigger foods and avoiding laying down within 30 minutes after eating things that may cause GERD symtpoms  Plan  Continue current medications  Vaccines   Reviewed and discussed patient's vaccination history.    Immunization History  Administered Date(s) Administered  . Fluad Quad(high Dose 65+) 01/16/2020  . Influenza Split 12/28/2011  . Influenza, High Dose Seasonal PF 12/23/2016  . Influenza,inj,Quad PF,6+ Mos 12/30/2012, 12/08/2013, 01/02/2015, 01/18/2016, 12/30/2018  . MMR 07/29/1995, 11/02/1995  .  Moderna SARS-COVID-2 Vaccination 09/05/2019, 10/07/2019  . Pneumococcal Conjugate-13 09/18/2016  . Pneumococcal Polysaccharide-23 11/04/2018  . Td 07/29/1995  . Tdap 12/25/2011, 12/08/2013  . Zoster Recombinat (Shingrix) 05/14/2017, 09/13/2017    Plan  Patient vaccines are up to date  Medication Management   . Miscellaneous medications:  o Lasix 40mg 48mo Linzess 72 mcg daily . OTC's:  o Vitamin D3 1000 o Tylenol prn . Patient currently uses [facility].   . Patient reports using pill pack method to organize medications and promote adherence. . Patient denies missed doses of medication.   ChristBeverly MilchmD Clinical Pharmacist [place] [phone]

## 2020-02-21 ENCOUNTER — Ambulatory Visit: Payer: Medicare Other

## 2020-02-21 DIAGNOSIS — E1149 Type 2 diabetes mellitus with other diabetic neurological complication: Secondary | ICD-10-CM

## 2020-02-21 DIAGNOSIS — E785 Hyperlipidemia, unspecified: Secondary | ICD-10-CM

## 2020-02-21 DIAGNOSIS — F3340 Major depressive disorder, recurrent, in remission, unspecified: Secondary | ICD-10-CM

## 2020-02-21 DIAGNOSIS — I10 Essential (primary) hypertension: Secondary | ICD-10-CM

## 2020-02-21 NOTE — Patient Instructions (Addendum)
Visit Information Thank you for meeting with me today!  I look forward to working with you to help you meet all of your healthcare goals and answer any questions you may have.  Feel free to contact me anytime!  Goals Addressed            This Visit's Progress   . Pharmacy Care Plan:       CARE PLAN ENTRY (see longitudinal plan of care for additional care plan information)  Current Barriers:  . Chronic Disease Management support, education, and care coordination needs related to Hypertension, Hyperlipidemia, Diabetes, and Depression   Hypertension BP Readings from Last 3 Encounters:  01/16/20 130/68  01/06/19 130/77  12/30/18 130/88   . Pharmacist Clinical Goal(s): o Over the next 180 days, patient will work with PharmD and providers to maintain BP goal <130/80 . Current regimen:  o None . Interventions: o Recommended continued physical exercise (walking) o Provided chair exercise handout in AVS . Patient self care activities - Over the next 180 days, patient will: o Check BP periodically, document, and provide at future appointments o Ensure daily salt intake < 2300 mg/day  Hyperlipidemia Lab Results  Component Value Date/Time   LDLCALC 67 01/16/2020 12:02 PM   LDLDIRECT 131 (H) 09/06/2013 12:53 PM   . Pharmacist Clinical Goal(s): o Over the next 180 days, patient will work with PharmD and providers to maintain LDL goal < 70 . Current regimen:  o Simvastatin 16m . Interventions: o Counseled on most recent lipid panel and meaning of each measure o Evaluated tolerance of medication regimen . Patient self care activities - Over the next 180 days, patient will: o Continue to focus on medication adherence o Work on adding chair exercises to current activity plan  Diabetes w/ neuropathy Lab Results  Component Value Date/Time   HGBA1C 6.2 (H) 01/16/2020 12:02 PM   HGBA1C 5.9 (H) 11/04/2018 10:31 AM   HGBA1C 5.8 (H) 06/06/2013 10:55 AM   . Pharmacist Clinical  Goal(s): o Over the next 180 days, patient will work with PharmD and providers to maintain A1c goal <7% . Current regimen:  o Pregabalin 153mthree times per day . Interventions: o Reviewed current symptom level o Dicussed most recent A1c o Evaluated home blood sugar readings . Patient self care activities - Over the next 180 days, patient will: o Check blood sugar once daily, document, and provide at future appointments o Contact provider with any episodes of hyperglycemia or symptoms o Work on adding physical activity such as chair exercises  Depression . Pharmacist Clinical Goal(s) o Over the next 180 days, patient will work with PharmD and providers to minimize symptoms of depression. . Current regimen:  . Duloxetine 6024mam . Trazodone 100m39mBuspirone 10mg8m . Interventions: o Encouraged continued participation in group at living complex o Discussed sleep patterns Patient self care activities - Over the next 180 days, patient will: o Continue to participate in her walking group at complex o Notify providers of any new or worsening symptoms    Initial goal documentation        Ms. BarneMellettegiven information about Chronic Care Management services today including:  1. CCM service includes personalized support from designated clinical staff supervised by her physician, including individualized plan of care and coordination with other care providers 2. 24/7 contact phone numbers for assistance for urgent and routine care needs. 3. Standard insurance, coinsurance, copays and deductibles apply for chronic care management only during months in  which we provide at least 20 minutes of these services. Most insurances cover these services at 100%, however patients may be responsible for any copay, coinsurance and/or deductible if applicable. This service may help you avoid the need for more expensive face-to-face services. 4. Only one practitioner may furnish and bill the  service in a calendar month. 5. The patient may stop CCM services at any time (effective at the end of the month) by phone call to the office staff.  Patient agreed to services and verbal consent obtained.   The patient verbalized understanding of instructions, educational materials, and care plan provided today and agreed to receive a mailed copy of patient instructions, educational materials, and care plan.  Telephone follow up appointment with pharmacy team member scheduled for: 6 months  Yesenia [name], PharmD Clinical Pharmacist [place] [phone]   Exercises To Do While Sitting  Exercises that you do while sitting (chair exercises) can give you many of the same benefits as full exercise. Benefits include strengthening your heart, burning calories, and keeping muscles and joints healthy. Exercise can also improve your mood and help with depression and anxiety. You may benefit from chair exercises if you are unable to do standing exercises because of:  Diabetic foot pain.  Obesity.  Illness.  Arthritis.  Recovery from surgery or injury.  Breathing problems.  Balance problems.  Another type of disability. Before starting chair exercises, check with your health care provider or a physical therapist to find out how much exercise you can tolerate and which exercises are safe for you. If your health care provider approves:  Start out slowly and build up over time. Aim to work up to about 10-20 minutes for each exercise session.  Make exercise part of your daily routine.  Drink water when you exercise. Do not wait until you are thirsty. Drink every 10-15 minutes.  Stop exercising right away if you have pain, nausea, shortness of breath, or dizziness.  If you are exercising in a wheelchair, make sure to lock the wheels.  Ask your health care provider whether you can do tai chi or yoga. Many positions in these mind-body exercises can be modified to  do while seated. Warm-up Before starting other exercises: 1. Sit up as straight as you can. Have your knees bent at 90 degrees, which is the shape of the capital letter "L." Keep your feet flat on the floor. 2. Sit at the front edge of your chair, if you can. 3. Pull in (tighten) the muscles in your abdomen and stretch your spine and neck as straight as you can. Hold this position for a few minutes. 4. Breathe in and out evenly. Try to concentrate on your breathing, and relax your mind. Stretching Exercise A: Arm stretch 1. Hold your arms out straight in front of your body. 2. Bend your hands at the wrist with your fingers pointing up, as if signaling someone to stop. Notice the slight tension in your forearms as you hold the position. 3. Keeping your arms out and your hands bent, rotate your hands outward as far as you can and hold this stretch. Aim to have your thumbs pointing up and your pinkie fingers pointing down. Slowly repeat arm stretches for one minute as tolerated. Exercise B: Leg stretch 1. If you can move your legs, try to "draw" letters on the floor with the toes of your foot. Write your name with one foot. 2. Write your name with the toes of your  other foot. Slowly repeat the movements for one minute as tolerated. Exercise C: Reach for the sky 1. Reach your hands as far over your head as you can to stretch your spine. 2. Move your hands and arms as if you are climbing a rope. Slowly repeat the movements for one minute as tolerated. Range of motion exercises Exercise A: Shoulder roll 1. Let your arms hang loosely at your sides. 2. Lift just your shoulders up toward your ears, then let them relax back down. 3. When your shoulders feel loose, rotate your shoulders in backward and forward circles. Do shoulder rolls slowly for one minute as tolerated. Exercise B: March in place 1. As if you are marching, pump your arms and lift your legs up and down. Lift your knees as high as  you can. ? If you are unable to lift your knees, just pump your arms and move your ankles and feet up and down. March in place for one minute as tolerated. Exercise C: Seated jumping jacks 1. Let your arms hang down straight. 2. Keeping your arms straight, lift them up over your head. Aim to point your fingers to the ceiling. 3. While you lift your arms, straighten your legs and slide your heels along the floor to your sides, as wide as you can. 4. As you bring your arms back down to your sides, slide your legs back together. ? If you are unable to use your legs, just move your arms. Slowly repeat seated jumping jacks for one minute as tolerated. Strengthening exercises Exercise A: Shoulder squeeze 1. Hold your arms straight out from your body to your sides, with your elbows bent and your fists pointed at the ceiling. 2. Keeping your arms in the bent position, move them forward so your elbows and forearms meet in front of your face. 3. Open your arms back out as wide as you can with your elbows still bent, until you feel your shoulder blades squeezing together. Hold for 5 seconds. Slowly repeat the movements forward and backward for one minute as tolerated. Contact a health care provider if you:  Had to stop exercising due to any of the following: ? Pain. ? Nausea. ? Shortness of breath. ? Dizziness. ? Fatigue.  Have significant pain or soreness after exercising. Get help right away if you have:  Chest pain.  Difficulty breathing. These symptoms may represent a serious problem that is an emergency. Do not wait to see if the symptoms will go away. Get medical help right away. Call your local emergency services (911 in the [place] Do not drive yourself to the hospital. This information is not intended to replace advice given to you by your health care provider. Make sure you discuss any questions you have with your health care provider. Document Revised: 07/14/2018 Document Reviewed:  02/03/2017 Elsevier Patient Education  2020 [employer].

## 2020-02-26 ENCOUNTER — Other Ambulatory Visit: Payer: Self-pay | Admitting: *Deleted

## 2020-02-26 DIAGNOSIS — I10 Essential (primary) hypertension: Secondary | ICD-10-CM

## 2020-02-26 DIAGNOSIS — E785 Hyperlipidemia, unspecified: Secondary | ICD-10-CM

## 2020-02-26 DIAGNOSIS — N182 Chronic kidney disease, stage 2 (mild): Secondary | ICD-10-CM

## 2020-02-26 DIAGNOSIS — E038 Other specified hypothyroidism: Secondary | ICD-10-CM

## 2020-02-26 DIAGNOSIS — R296 Repeated falls: Secondary | ICD-10-CM

## 2020-02-26 DIAGNOSIS — F3340 Major depressive disorder, recurrent, in remission, unspecified: Secondary | ICD-10-CM

## 2020-02-26 DIAGNOSIS — E1149 Type 2 diabetes mellitus with other diabetic neurological complication: Secondary | ICD-10-CM

## 2020-02-26 DIAGNOSIS — K760 Fatty (change of) liver, not elsewhere classified: Secondary | ICD-10-CM

## 2020-02-26 DIAGNOSIS — M810 Age-related osteoporosis without current pathological fracture: Secondary | ICD-10-CM

## 2020-03-06 ENCOUNTER — Other Ambulatory Visit: Payer: Self-pay

## 2020-03-06 ENCOUNTER — Encounter: Payer: Self-pay | Admitting: Orthopaedic Surgery

## 2020-03-06 ENCOUNTER — Ambulatory Visit (INDEPENDENT_AMBULATORY_CARE_PROVIDER_SITE_OTHER): Payer: Medicare Other | Admitting: Orthopaedic Surgery

## 2020-03-06 DIAGNOSIS — M79659 Pain in unspecified thigh: Secondary | ICD-10-CM | POA: Insufficient documentation

## 2020-03-06 DIAGNOSIS — M79651 Pain in right thigh: Secondary | ICD-10-CM

## 2020-03-06 MED ORDER — LIDOCAINE HCL 1 % IJ SOLN
2.0000 mL | INTRAMUSCULAR | Status: AC | PRN
  Administered 2020-03-06: 2 mL

## 2020-03-06 MED ORDER — BUPIVACAINE HCL 0.5 % IJ SOLN
2.0000 mL | INTRAMUSCULAR | Status: AC | PRN
  Administered 2020-03-06: 2 mL via INTRA_ARTICULAR
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
## 2020-03-11 ENCOUNTER — Telehealth: Payer: Self-pay | Admitting: *Deleted

## 2020-03-11 NOTE — Telephone Encounter (Signed)
Received call from patient.   Reports that she was seen at Dr. Durward Fortes in [place] for R leg pain. States that she was given cortisone injection. Reports that Dr. Durward Fortes recommended adding Gabapentin or increasing her Lyrica to TID for pain.  MD please advise.

## 2020-03-11 NOTE — Telephone Encounter (Signed)
Of note, chart notes and imaging requested from orthopedics.

## 2020-03-12 NOTE — Telephone Encounter (Signed)
I dont prescribe both gabapentin and lyrica due to side effects  SHE can schedule OV TO DISCUSS pain control or referral to pain specialist

## 2020-03-12 NOTE — Telephone Encounter (Signed)
Call placed to patient. LMTRC.  

## 2020-03-13 NOTE — Telephone Encounter (Signed)
Call placed to patient. LMTRC.  

## 2020-03-14 NOTE — Telephone Encounter (Signed)
Multiple calls placed to patient with no answer and no return call.   Message to be closed.  

## 2020-04-02 ENCOUNTER — Other Ambulatory Visit: Payer: Self-pay | Admitting: Family Medicine

## 2020-04-03 ENCOUNTER — Other Ambulatory Visit: Payer: Self-pay | Admitting: Family Medicine

## 2020-04-03 NOTE — Telephone Encounter (Signed)
Ok to refill??  Last office visit 01/16/2020.  Last refill 01/17/2020, #2 refills.

## 2020-04-09 ENCOUNTER — Telehealth: Payer: Self-pay | Admitting: Internal Medicine

## 2020-04-09 ENCOUNTER — Telehealth: Payer: Self-pay | Admitting: Pharmacist

## 2020-04-09 NOTE — Telephone Encounter (Signed)
Pt is on the OCT 2021 recall for a colonoscopy. Please advise if she needs a nurse visit of OV. [phone]

## 2020-04-09 NOTE — Telephone Encounter (Signed)
Pt needs ov. 

## 2020-04-09 NOTE — Progress Notes (Addendum)
Chronic Care Management Pharmacy Assistant   Name: Yesenia [name]  MRN: [id number] DOB: May 12, 1950  Reason for Encounter: Disease State for DM  Patient Questions:  1.  Have you seen any other providers since your last visit? Yes.   2.  Any changes in your medicines or health? No.    PCP : [name], MD   Their chronic conditions include: HTN, DVT/PE, GERD, Hypothyroidism, Type II DM w/ neuropathy and CKD, HLD, Anxiety/Depression, Osteoporosis.  Office Visits: None since 02/21/20  Consults:  03/06/20 Ortho with Dr. Durward Fortes. Joint injections administered. No medication changes.   Allergies:   Allergies  Allergen Reactions   Aspirin Other (See Comments)    G.I. Upset    Nalbuphine Itching, Swelling and Rash    Nubaine     Medications: Outpatient Encounter Medications as of 04/09/2020  Medication Sig   alendronate (FOSAMAX) 70 MG tablet TAKE 1 TABLET BY MOUTH ONCE WEEKLY. TAKE WITH FULL GLASS OF WATER ON AN EMPTY STOMACH.   Blood Glucose Monitoring Suppl (BLOOD GLUCOSE SYSTEM PAK) KIT Please dispense as Accu Chek Guide. USE TO TEST BLOOD SUGAR 3 TIMES DAILY. Dx: E11.65.   busPIRone (BUSPAR) 10 MG tablet TAKE 1 TABLET BY MOUTH 3 TIMES DAILY.   cholecalciferol (VITAMIN D3) 25 MCG (1000 UNIT) tablet Take 1 tablet (1,000 Units total) by mouth daily.   DEXILANT 60 MG capsule TAKE 1 CAPSULE BY MOUTH ONCE A DAY.   DULoxetine (CYMBALTA) 60 MG capsule TAKE 1 CAPSULE BY MOUTH EVERY MORNING.   famotidine (PEPCID) 20 MG tablet TAKE 1 TABLET TWICE DAILY AS NEEDED FOR HEARTBURN/INDIGESTION.   furosemide (LASIX) 40 MG tablet TAKE (1) TABLET BY MOUTH ONCE DAILY AS NEEDED.   Glucose Blood (BLOOD GLUCOSE TEST STRIPS) STRP Please dispense as Accu Chek Guide. USE TO TEST BLOOD SUGAR 3 TIMES DAILY. Dx: E11.65   Lancet Devices MISC Please dispense based on patient and insurance preference. Monitor FSBS 3x daily. Dx: E11.65   Lancets MISC Please dispense as Accu Chek Guide. USE TO  TEST BLOOD SUGAR 3 TIMES DAILY. Dx: E11.65.   levothyroxine (SYNTHROID) 150 MCG tablet TAKE (1) TABLET BY MOUTH ONCE DAILY BEFORE BREAKFAST.   LINZESS 72 MCG capsule TAKE 1 CAPSULE BY MOUTH ONCE DAILY BEFORE BREAKFAST.   pregabalin (LYRICA) 150 MG capsule TAKE 1 CAPSULE BY MOUTH THREE TIMES DAILY.**ALWAYS IN BOTTLE**   simvastatin (ZOCOR) 10 MG tablet TAKE 1 TABLET BY MOUTH AT BEDTIME.   traZODone (DESYREL) 100 MG tablet TAKE 1&1/2 TABLETS DAILY AT BEDTIME FOR SLEEP AND MOOD   No facility-administered encounter medications on file as of 04/09/2020.    Current Diagnosis: Patient Active Problem List   Diagnosis Date Noted   Thigh pain 03/06/2020   Heart murmur 23/53/6144   Diastolic dysfunction 31/54/0086   Osteoporosis 02/18/2016   Proximal humerus fracture 01/12/2016   Fatty liver 09/04/2015   Small bowel lesion    Neurotic excoriations 05/14/2015   Constipation 05/09/2015   History of colonic polyps    Diverticulosis of colon without hemorrhage    Mucosal abnormality of stomach    Mucosal abnormality of esophagus    Reflux esophagitis    Dysphagia    FH: colon cancer 12/19/2014   Hyperlipemia 09/06/2013   MDD (major depressive disorder) 09/06/2013   Previous back surgery 11/04/2012   DVT (deep venous thrombosis) (HCC)    PE (pulmonary embolism)    CKD (chronic kidney disease), stage II 05/17/2012   Diabetic neuropathy ([place]) 03/18/2012  Sleep apnea 10/28/2011   Type II diabetes mellitus with neurological manifestations ([place]) 10/28/2011   Fibromyalgia 10/20/2011   Essential hypertension, benign 09/08/2011   Hypothyroidism 09/08/2011   DDD (degenerative disc disease), lumbar 09/08/2011   Class 3 obesity 09/08/2011   Anxiety 09/08/2011   GERD 01/08/2010   SPINAL STENOSIS, LUMBAR 09/16/2009    Goals Addressed   None    Recent Relevant Labs: Lab Results  Component Value Date/Time   HGBA1C 6.2 (H) 01/16/2020 12:02 PM   HGBA1C 5.9 (H) 11/04/2018 10:31 AM   HGBA1C 5.8  (H) 06/06/2013 10:55 AM   MICROALBUR 1.0 01/16/2020 12:02 PM   MICROALBUR 0.3 11/04/2018 10:31 AM    Kidney Function Lab Results  Component Value Date/Time   CREATININE 1.11 (H) 01/16/2020 12:02 PM   CREATININE 1.02 (H) 11/04/2018 10:31 AM   GFRNONAA 47 (L) 10/22/2018 10:50 PM   GFRNONAA 71 07/25/2015 12:23 PM   GFRAA 55 (L) 10/22/2018 10:50 PM   GFRAA 81 07/25/2015 12:23 PM    Current antihyperglycemic regimen:  Pregabalin 150 mg three times per day  What recent interventions/DTPs have been made to improve glycemic control:  None  Have there been any recent hospitalizations or ED visits since last visit with CPP? No.  Patient reports hypoglycemic symptoms, including Nervous/irritable   Patient denies hyperglycemic symptoms, including none   How often are you checking your blood sugar?Patient stated she checks her blood sugar  once daily  And sometimes more if she doesn't feel good.  What are your blood sugars ranging? Patient was unable to give me actual readings but stated her readings have been around 110/120's. Fasting: N/A Before meals: N/A After meals: N/A Bedtime: N/A  During the week, how often does your blood glucose drop below 70? Never   Are you checking your feet daily/regularly?   Patient stated she checks her feet daily when she takes her shoes off while watching TV.    Adherence Review: Is the patient currently on a STATIN medication? Yes, Simvastatin 10 mg    Is the patient currently on ACE/ARB medication? No.  Does the patient have >5 day gap between last estimated fill dates? No, CPP Please Check.  [facility] stated [facility] and they put them in a pack and delivery them to her. Patient stated her activity level is her routine house duties. She meets with a group in her building and they are waiting for warmer weather so they can start walking around the building.   Follow-Up:  Pharmacist Review   Charlann Lange, RMA Clinical  Pharmacist Assistant [phone]  5 minutes spent in review, coordination, and documentation.  Reviewed by: [name], PharmD Clinical Pharmacist [facility] [phone]

## 2020-04-09 NOTE — Telephone Encounter (Signed)
Pt aware of OV ?

## 2020-04-17 ENCOUNTER — Other Ambulatory Visit: Payer: Self-pay

## 2020-04-17 ENCOUNTER — Ambulatory Visit: Payer: Medicare Other | Admitting: Orthopaedic Surgery

## 2020-04-25 ENCOUNTER — Telehealth: Payer: Self-pay | Admitting: Pharmacist

## 2020-04-25 NOTE — Progress Notes (Signed)
    Chronic Care Management Pharmacy Assistant   Name: Yesenia [name]  MRN: [id number] DOB: 1950/11/04  Reason for Encounter: Adherence Review  Patient Questions:  1.  Have you seen any other providers since your last visit? No.   2.  Any changes in your medicines or health? No.   PCP : [name], MD   Verified Adherence Gap Information. Per insurance data, the patient is 90-99% compliant with the following medication: Simvastatin 10 mg. Patient total gap measures equal to 2. Their last A1C was 6.2 on 01/17/20.The patient did not met their annual wellness visit or their wellness bundle.   Follow-Up:  Pharmacist Review

## 2020-04-29 ENCOUNTER — Inpatient Hospital Stay (HOSPITAL_COMMUNITY): Admission: RE | Admit: 2020-04-29 | Payer: Medicare Other | Source: Ambulatory Visit

## 2020-05-07 ENCOUNTER — Ambulatory Visit: Payer: Medicare Other | Admitting: Internal Medicine

## 2020-05-21 ENCOUNTER — Ambulatory Visit: Payer: Medicare Other | Admitting: Family Medicine

## 2020-05-22 ENCOUNTER — Other Ambulatory Visit: Payer: Self-pay

## 2020-05-22 ENCOUNTER — Ambulatory Visit (INDEPENDENT_AMBULATORY_CARE_PROVIDER_SITE_OTHER): Payer: Medicare Other | Admitting: Family Medicine

## 2020-05-22 ENCOUNTER — Encounter: Payer: Self-pay | Admitting: Family Medicine

## 2020-05-22 VITALS — BP 132/74 | HR 82 | Temp 97.9°F | Resp 14 | Ht 61.0 in | Wt 258.0 lb

## 2020-05-22 DIAGNOSIS — M810 Age-related osteoporosis without current pathological fracture: Secondary | ICD-10-CM

## 2020-05-22 DIAGNOSIS — K760 Fatty (change of) liver, not elsewhere classified: Secondary | ICD-10-CM | POA: Diagnosis not present

## 2020-05-22 DIAGNOSIS — N182 Chronic kidney disease, stage 2 (mild): Secondary | ICD-10-CM | POA: Diagnosis not present

## 2020-05-22 DIAGNOSIS — F3340 Major depressive disorder, recurrent, in remission, unspecified: Secondary | ICD-10-CM

## 2020-05-22 DIAGNOSIS — R011 Cardiac murmur, unspecified: Secondary | ICD-10-CM

## 2020-05-22 DIAGNOSIS — E1149 Type 2 diabetes mellitus with other diabetic neurological complication: Secondary | ICD-10-CM | POA: Diagnosis not present

## 2020-05-22 DIAGNOSIS — I10 Essential (primary) hypertension: Secondary | ICD-10-CM

## 2020-05-22 DIAGNOSIS — E669 Obesity, unspecified: Secondary | ICD-10-CM

## 2020-05-22 DIAGNOSIS — E038 Other specified hypothyroidism: Secondary | ICD-10-CM

## 2020-05-22 DIAGNOSIS — R21 Rash and other nonspecific skin eruption: Secondary | ICD-10-CM

## 2020-05-22 DIAGNOSIS — E1143 Type 2 diabetes mellitus with diabetic autonomic (poly)neuropathy: Secondary | ICD-10-CM | POA: Diagnosis not present

## 2020-05-22 MED ORDER — MUPIROCIN CALCIUM 2 % EX CREA: 1.0000 "application " | TOPICAL_CREAM | Freq: Two times a day (BID) | CUTANEOUS | 0 refills | Status: DC

## 2020-05-22 NOTE — Patient Instructions (Signed)
We will call with lab results   

## 2020-05-22 NOTE — Assessment & Plan Note (Signed)
Pt has new psychiatry, on increased dose of Cymbalta

## 2020-05-22 NOTE — Assessment & Plan Note (Addendum)
Continue fosamasx, vitamin D Due for bone density

## 2020-05-22 NOTE — Assessment & Plan Note (Signed)
Controlled no changes 

## 2020-05-22 NOTE — Assessment & Plan Note (Signed)
Currently diet controlled, on statin drug

## 2020-05-22 NOTE — Assessment & Plan Note (Signed)
Continue levothyroxine replacement. 

## 2020-05-22 NOTE — Assessment & Plan Note (Signed)
Chronic pain, with neuropathy She is on high dose of lyrica I do not recommend going above 450mg  total a day

## 2020-05-22 NOTE — Progress Notes (Signed)
Subjective:    Patient ID: Yesenia [name], female    DOB: Mar 05, 1951, 70 y.o.   MRN: [id number]  Patient presents for Follow-up (Is not fasting/)   Pt here to f/u chronic meidcal problems    She seen by Orthopedics , she was seen by Dr. Durward Fortes, given rollator  She has had multiple falls , they recommended she see a neurologist. I dont have any records regarding any particular concerns. She states she feels her balance isnt good  DM- She states she check CBG  Has been less than 120, last A1 6.2%   Due for eye appt   HTN- she does check BP at home  Chronic neuropathty taking lyrica three times a day   Hypothyroidism- taking levothyroxine 148mcg daily  MDD- taking cybalta, buspar , takes trazodone for sleep  No longer on buspar   Constipation- taking linzess dialy / she has appt March 1st with GI, for EGD/Colonoscopy She is still on dexilant   MDD- [facility], has psychiatry, her cymbalta was increased to  60mg  in AM and 30mg     Osteoporosis- taking vitamin D 1000IU, fosamasx once a aweek      Peripheraal edema taking lasix   Osteoporosis- taking fosamax once a week, she has not been taking vitamin D   On exam had small left submandibular node, scabs on face with mild erythema from picking at skin, also on ears, states they have not healed up  Review Of Systems:  GEN- denies fatigue, fever, weight loss,weakness, recent illness HEENT- denies eye drainage, change in vision, nasal discharge, CVS- denies chest pain, palpitations RESP- denies SOB, cough, wheeze ABD- denies N/V, change in stools, abd pain GU- denies dysuria, hematuria, dribbling, incontinence MSK- +joint pain, muscle aches, injury Neuro- denies headache, dizziness, syncope, seizure activity       Objective:    BP 132/74   Pulse 82   Temp 97.9 F (36.6 C) (Temporal)   Resp 14   Ht 5\' 1"  (1.549 m)   Wt 258 lb (117 kg)   SpO2 96%   BMI 48.75 kg/m  GEN- NAD, alert and oriented  x3 HEENT- PERRL, EOMI, non injected sclera, pink conjunctiva, MMM, oropharynx clear, TM clear, no sinus tenderness, nares clear Neck- Supple, no thyromegaly, small left submandibular node palpated CVS- RRR, 3/6 systolic murmur RESP-CTAB ABD-NABS,soft,NT,ND Skin- erythematous scabs on bilat cheeks, left ear lobe, no drainge EXT- No edema Pulses- Radial, DP- 2+        Assessment & Plan:      Problem List Items Addressed This Visit      Unprioritized   CKD (chronic kidney disease), stage II   Class 3 obesity   Diabetic neuropathy (HCC)    Chronic pain, with neuropathy She is on high dose of lyrica I do not recommend going above 450mg  total a day       Essential hypertension, benign - Primary    Controlled no changes      Relevant Orders   CBC with Differential/Platelet   Comprehensive metabolic panel   Fatty liver   Relevant Orders   Comprehensive metabolic panel   Heart murmur   Hypothyroidism    Continue levothyroxine replacement      Relevant Orders   TSH   MDD (major depressive disorder)    Pt has new psychiatry, on increased dose of Cymbalta      Relevant Medications   DULoxetine (CYMBALTA) 30 MG capsule   Osteoporosis    Continue fosamasx,  vitamin D Due for bone density      Type II diabetes mellitus with neurological manifestations (HCC)    Currently diet controlled, on statin drug       Relevant Orders   Hemoglobin A1c    Other Visit Diagnoses    Skin rash       concern for strep/staph topical with scabs and picking, has small lymph node no other source of infection topical antibiotic given for lesions      Note: This dictation was prepared with Dragon dictation along with smaller phrase technology. Any transcriptional errors that result from this process are unintentional.

## 2020-05-23 LAB — CBC WITH DIFFERENTIAL/PLATELET
Absolute Monocytes: 444 cells/uL (ref 200–950)
Basophils Absolute: 72 cells/uL (ref 0–200)
Basophils Relative: 1.2 %
Eosinophils Absolute: 402 cells/uL (ref 15–500)
Eosinophils Relative: 6.7 %
HCT: 36.2 % (ref 35.0–45.0)
Hemoglobin: 10.8 g/dL — ABNORMAL LOW (ref 11.7–15.5)
Lymphs Abs: 2358 cells/uL (ref 850–3900)
MCH: 23.2 pg — ABNORMAL LOW (ref 27.0–33.0)
MCHC: 29.8 g/dL — ABNORMAL LOW (ref 32.0–36.0)
MCV: 77.8 fL — ABNORMAL LOW (ref 80.0–100.0)
MPV: 11.3 fL (ref 7.5–12.5)
Monocytes Relative: 7.4 %
Neutro Abs: 2724 cells/uL (ref 1500–7800)
Neutrophils Relative %: 45.4 %
Platelets: 256 10*3/uL (ref 140–400)
RBC: 4.65 10*6/uL (ref 3.80–5.10)
RDW: 15 % (ref 11.0–15.0)
Total Lymphocyte: 39.3 %
WBC: 6 10*3/uL (ref 3.8–10.8)

## 2020-05-23 LAB — COMPREHENSIVE METABOLIC PANEL
AG Ratio: 1.3 (calc) (ref 1.0–2.5)
ALT: 8 U/L (ref 6–29)
AST: 13 U/L (ref 10–35)
Albumin: 3.8 g/dL (ref 3.6–5.1)
Alkaline phosphatase (APISO): 69 U/L (ref 37–153)
BUN: 16 mg/dL (ref 7–25)
CO2: 25 mmol/L (ref 20–32)
Calcium: 9.7 mg/dL (ref 8.6–10.4)
Chloride: 105 mmol/L (ref 98–110)
Creat: 0.92 mg/dL (ref 0.50–0.99)
Globulin: 2.9 g/dL (calc) (ref 1.9–3.7)
Glucose, Bld: 118 mg/dL — ABNORMAL HIGH (ref 65–99)
Potassium: 4.2 mmol/L (ref 3.5–5.3)
Sodium: 140 mmol/L (ref 135–146)
Total Bilirubin: 0.3 mg/dL (ref 0.2–1.2)
Total Protein: 6.7 g/dL (ref 6.1–8.1)

## 2020-05-23 LAB — HEMOGLOBIN A1C
Hgb A1c MFr Bld: 6.3 % of total Hgb — ABNORMAL HIGH (ref <5.7)
Mean Plasma Glucose: 134 mg/dL
eAG (mmol/L): 7.4 mmol/L

## 2020-05-23 LAB — TSH: TSH: 0.33 mIU/L — ABNORMAL LOW (ref 0.40–4.50)

## 2020-05-30 ENCOUNTER — Other Ambulatory Visit: Payer: Self-pay | Admitting: Family Medicine

## 2020-06-04 ENCOUNTER — Telehealth: Payer: Self-pay

## 2020-06-04 ENCOUNTER — Other Ambulatory Visit: Payer: Self-pay

## 2020-06-04 ENCOUNTER — Encounter: Payer: Self-pay | Admitting: Internal Medicine

## 2020-06-04 ENCOUNTER — Ambulatory Visit (INDEPENDENT_AMBULATORY_CARE_PROVIDER_SITE_OTHER): Payer: Medicare Other | Admitting: Internal Medicine

## 2020-06-04 VITALS — BP 132/76 | HR 70 | Temp 97.3°F | Ht 61.0 in | Wt 256.2 lb

## 2020-06-04 DIAGNOSIS — R1319 Other dysphagia: Secondary | ICD-10-CM

## 2020-06-04 MED ORDER — PEG 3350-KCL-NA BICARB-NACL 420 G PO SOLR: 4000.0000 mL | ORAL | 0 refills | Status: DC

## 2020-06-04 NOTE — Patient Instructions (Signed)
Schedule an EGD with esophageal dilation-esophageal dysphagia and high risk screening colonoscopy (ASA 3-propofol)  We will increase Linzess to 290-1 capsule daily (dispense 30 with 11 refills)  Continue Dexilant 60 mg daily for GERD  Further recommendations to follow.
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
## 2020-06-04 NOTE — Telephone Encounter (Signed)
PA for EGD/DIL submitted via [facility] website. PA# [id number], valid 07/29/20-10/27/20. No PA needed for TCS.

## 2020-06-05 ENCOUNTER — Telehealth: Payer: Self-pay | Admitting: Internal Medicine

## 2020-06-05 ENCOUNTER — Other Ambulatory Visit: Payer: Self-pay

## 2020-06-05 MED ORDER — LINACLOTIDE 290 MCG PO CAPS: 290.0000 ug | ORAL_CAPSULE | Freq: Every day | ORAL | 11 refills | Status: DC

## 2020-06-05 NOTE — Telephone Encounter (Signed)
Spoke with pt. Linzess 290 mcg one pill daily was sent to the pharmacy.

## 2020-06-05 NOTE — Telephone Encounter (Signed)
Patient said that Dr. Gala Romney was going to increase her Linzess prescription but nothing has been sent to her pharmacy yet.  She uses [facility] in [place]

## 2020-06-12 ENCOUNTER — Ambulatory Visit: Payer: Medicare Other | Admitting: Orthopaedic Surgery

## 2020-06-12 ENCOUNTER — Other Ambulatory Visit: Payer: Self-pay

## 2020-06-24 ENCOUNTER — Other Ambulatory Visit: Payer: Self-pay | Admitting: Family Medicine

## 2020-06-27 ENCOUNTER — Other Ambulatory Visit: Payer: Self-pay | Admitting: Family Medicine

## 2020-07-22 ENCOUNTER — Other Ambulatory Visit: Payer: Self-pay | Admitting: Family Medicine

## 2020-07-22 NOTE — Patient Instructions (Signed)
Yesenia [name]  07/22/2020     @PREFPERIOPPHARMACY @   Your procedure is scheduled on  07/29/2020.   Report to [name] at  [place]   Call this number if you have problems the morning of surgery:  [phone]   Remember:  Follow the diet and prep instructions given to you by the office.                    Take these medicines the morning of surgery with A SIP OF WATER  Dexilant, cymbalta, pepcid, levothyroxine.  DO NOT take any medications for diabetes the morning of your procedure.  If your glucose is 70 or below the morning of your procedure, drink 1/2 cup of clear juice and recheck your glucose in 15 minutes. If your glucose is still 70 or below, call [phone] for instructions.  If your glucose is 300 or above the morning of your procedure, call [phone] for instructions.     Please brush your teeth.  Do not wear jewelry, make-up or nail polish.  Do not wear lotions, powders, or perfumes, or deodorant.  Do not shave 48 hours prior to surgery.  Men may shave face and neck.  Do not bring valuables to the hospital.  [facility] is not responsible for any belongings or valuables.  Contacts, dentures or bridgework may not be worn into surgery.  Leave your suitcase in the car.  After surgery it may be brought to your room.  For patients admitted to the hospital, discharge time will be determined by your treatment team.  Patients discharged the day of surgery will not be allowed to drive home and must have someone with them for 24 hours.   Special instructions:  DO NOT smoke tobacco or vape for 24 hours before your procedure.   Please read over the following fact sheets that you were given. Anesthesia Post-op Instructions and Care and Recovery After Surgery       Upper Endoscopy, Adult, Care After This sheet gives you information about how to care for yourself after your procedure. Your health care provider may also give you more  specific instructions. If you have problems or questions, contact your health care provider. What can I expect after the procedure? After the procedure, it is common to have:  A sore throat.  Mild stomach pain or discomfort.  Bloating.  Nausea. Follow these instructions at home:  Follow instructions from your health care provider about what to eat or drink after your procedure.  Return to your normal activities as told by your health care provider. Ask your health care provider what activities are safe for you.  Take over-the-counter and prescription medicines only as told by your health care provider.  If you were given a sedative during the procedure, it can affect you for several hours. Do not drive or operate machinery until your health care provider says that it is safe.  Keep all follow-up visits as told by your health care provider. This is important.   Contact a health care provider if you have:  A sore throat that lasts longer than one day.  Trouble swallowing. Get help right away if:  You vomit blood or your vomit looks like coffee grounds.  You have: ? A fever. ? Bloody, black, or tarry stools. ? A severe sore throat or you cannot swallow. ? Difficulty breathing. ? Severe pain in your chest or abdomen. Summary  After the procedure, it is common to have a sore throat, mild stomach discomfort, bloating, and nausea.  If you were given a sedative during the procedure, it can affect you for several hours. Do not drive or operate machinery until your health care provider says that it is safe.  Follow instructions from your health care provider about what to eat or drink after your procedure.  Return to your normal activities as told by your health care provider. This information is not intended to replace advice given to you by your health care provider. Make sure you discuss any questions you have with your health care provider. Document Revised: 03/21/2019  Document Reviewed: 08/23/2017 Elsevier Patient Education  2021 [place].  [email or web address]://www.asge.org/home/for-patients/patient-information/understanding-eso-dilation-updated">  Esophageal Dilatation Esophageal dilatation, also called esophageal dilation, is a procedure to widen or open a blocked or narrowed part of the esophagus. The esophagus is the part of the body that moves food and liquid from the mouth to the stomach. You may need this procedure if:  You have a buildup of scar tissue in your esophagus that makes it difficult, painful, or impossible to swallow. This can be caused by gastroesophageal reflux disease (GERD).  You have cancer of the esophagus.  There is a problem with how food moves through your esophagus. In some cases, you may need this procedure repeated at a later time to dilate the esophagus gradually. Tell a health care provider about:  Any allergies you have.  All medicines you are taking, including vitamins, herbs, eye drops, creams, and over-the-counter medicines.  Any problems you or family members have had with anesthetic medicines.  Any blood disorders you have.  Any surgeries you have had.  Any medical conditions you have.  Any antibiotic medicines you are required to take before dental procedures.  Whether you are pregnant or may be pregnant. What are the risks? Generally, this is a safe procedure. However, problems may occur, including:  Bleeding due to a tear in the lining of the esophagus.  A hole, or perforation, in the esophagus. What happens before the procedure?  Ask your health care provider about: ? Changing or stopping your regular medicines. This is especially important if you are taking diabetes medicines or blood thinners. ? Taking medicines such as aspirin and ibuprofen. These medicines can thin your blood. Do not take these medicines unless your health care provider tells you to take them. ? Taking over-the-counter medicines,  vitamins, herbs, and supplements.  Follow instructions from your health care provider about eating or drinking restrictions.  Plan to have a responsible adult take you home from the hospital or clinic.  Plan to have a responsible adult care for you for the time you are told after you leave the hospital or clinic. This is important. What happens during the procedure?  You may be given a medicine to help you relax (sedative).  A numbing medicine may be sprayed into the back of your throat, or you may gargle the medicine.  Your health care provider may perform the dilatation using various surgical instruments, such as: ? Simple dilators. This instrument is carefully placed in the esophagus to stretch it. ? Guided wire bougies. This involves using an endoscope to insert a wire into the esophagus. A dilator is passed over this wire to enlarge the esophagus. Then the wire is removed. ? Balloon dilators. An endoscope with a small balloon is inserted into the esophagus. The balloon is inflated to stretch the esophagus and open it up. The  procedure may vary among health care providers and hospitals. What can I expect after the procedure?  Your blood pressure, heart rate, breathing rate, and blood oxygen level will be monitored until you leave the hospital or clinic.  Your throat may feel slightly sore and numb. This will get better over time.  You will not be allowed to eat or drink until your throat is no longer numb.  When you are able to drink, urinate, and sit on the edge of the bed without nausea or dizziness, you may be able to return home. Follow these instructions at home:  Take over-the-counter and prescription medicines only as told by your health care provider.  If you were given a sedative during the procedure, it can affect you for several hours. Do not drive or operate machinery until your health care provider says that it is safe.  Plan to have a responsible adult care for you  for the time you are told. This is important.  Follow instructions from your health care provider about any eating or drinking restrictions.  Do not use any products that contain nicotine or tobacco, such as cigarettes, e-cigarettes, and chewing tobacco. If you need help quitting, ask your health care provider.  Keep all follow-up visits. This is important. Contact a health care provider if:  You have a fever.  You have pain that is not relieved by medicine. Get help right away if:  You have chest pain.  You have trouble breathing.  You have trouble swallowing.  You vomit blood.  You have black, tarry, or bloody stools. These symptoms may represent a serious problem that is an emergency. Do not wait to see if the symptoms will go away. Get medical help right away. Call your local emergency services (911 in the [place] Do not drive yourself to the hospital. Summary  Esophageal dilatation, also called esophageal dilation, is a procedure to widen or open a blocked or narrowed part of the esophagus.  Plan to have a responsible adult take you home from the hospital or clinic.  For this procedure, a numbing medicine may be sprayed into the back of your throat, or you may gargle the medicine.  Do not drive or operate machinery until your health care provider says that it is safe. This information is not intended to replace advice given to you by your health care provider. Make sure you discuss any questions you have with your health care provider. Document Revised: 08/09/2019 Document Reviewed: 08/09/2019 Elsevier Patient Education  2021 [place].  Colonoscopy, Adult, Care After This sheet gives you information about how to care for yourself after your procedure. Your health care provider may also give you more specific instructions. If you have problems or questions, contact your health care provider. What can I expect after the procedure? After the procedure, it is common to  have:  A small amount of blood in your stool for 24 hours after the procedure.  Some gas.  Mild cramping or bloating of your abdomen. Follow these instructions at home: Eating and drinking  Drink enough fluid to keep your urine pale yellow.  Follow instructions from your health care provider about eating or drinking restrictions.  Resume your normal diet as instructed by your health care provider. Avoid heavy or fried foods that are hard to digest.   Activity  Rest as told by your health care provider.  Avoid sitting for a long time without moving. Get up to take short walks every 1-2 hours.  This is important to improve blood flow and breathing. Ask for help if you feel weak or unsteady.  Return to your normal activities as told by your health care provider. Ask your health care provider what activities are safe for you. Managing cramping and bloating  Try walking around when you have cramps or feel bloated.  Apply heat to your abdomen as told by your health care provider. Use the heat source that your health care provider recommends, such as a moist heat pack or a heating pad. ? Place a towel between your skin and the heat source. ? Leave the heat on for 20-30 minutes. ? Remove the heat if your skin turns bright red. This is especially important if you are unable to feel pain, heat, or cold. You may have a greater risk of getting burned.   General instructions  If you were given a sedative during the procedure, it can affect you for several hours. Do not drive or operate machinery until your health care provider says that it is safe.  For the first 24 hours after the procedure: ? Do not sign important documents. ? Do not drink alcohol. ? Do your regular daily activities at a slower pace than normal. ? Eat soft foods that are easy to digest.  Take over-the-counter and prescription medicines only as told by your health care provider.  Keep all follow-up visits as told by your  health care provider. This is important. Contact a health care provider if:  You have blood in your stool 2-3 days after the procedure. Get help right away if you have:  More than a small spotting of blood in your stool.  Large blood clots in your stool.  Swelling of your abdomen.  Nausea or vomiting.  A fever.  Increasing pain in your abdomen that is not relieved with medicine. Summary  After the procedure, it is common to have a small amount of blood in your stool. You may also have mild cramping and bloating of your abdomen.  If you were given a sedative during the procedure, it can affect you for several hours. Do not drive or operate machinery until your health care provider says that it is safe.  Get help right away if you have a lot of blood in your stool, nausea or vomiting, a fever, or increased pain in your abdomen. This information is not intended to replace advice given to you by your health care provider. Make sure you discuss any questions you have with your health care provider. Document Revised: 03/17/2019 Document Reviewed: 10/17/2018 Elsevier Patient Education  2021 [place] After This sheet gives you information about how to care for yourself after your procedure. Your health care provider may also give you more specific instructions. If you have problems or questions, contact your health care provider. What can I expect after the procedure? After the procedure, it is common to have:  Tiredness.  Forgetfulness about what happened after the procedure.  Impaired judgment for important decisions.  Nausea or vomiting.  Some difficulty with balance. Follow these instructions at home: For the time period you were told by your health care provider:  Rest as needed.  Do not participate in activities where you could fall or become injured.  Do not drive or use machinery.  Do not drink alcohol.  Do not take sleeping  pills or medicines that cause drowsiness.  Do not make important decisions or sign legal documents.  Do not take  care of children on your own.      Eating and drinking  Follow the diet that is recommended by your health care provider.  Drink enough fluid to keep your urine pale yellow.  If you vomit: ? Drink water, juice, or soup when you can drink without vomiting. ? Make sure you have little or no nausea before eating solid foods. General instructions  Have a responsible adult stay with you for the time you are told. It is important to have someone help care for you until you are awake and alert.  Take over-the-counter and prescription medicines only as told by your health care provider.  If you have sleep apnea, surgery and certain medicines can increase your risk for breathing problems. Follow instructions from your health care provider about wearing your sleep device: ? Anytime you are sleeping, including during daytime naps. ? While taking prescription pain medicines, sleeping medicines, or medicines that make you drowsy.  Avoid smoking.  Keep all follow-up visits as told by your health care provider. This is important. Contact a health care provider if:  You keep feeling nauseous or you keep vomiting.  You feel light-headed.  You are still sleepy or having trouble with balance after 24 hours.  You develop a rash.  You have a fever.  You have redness or swelling around the IV site. Get help right away if:  You have trouble breathing.  You have new-onset confusion at home. Summary  For several hours after your procedure, you may feel tired. You may also be forgetful and have poor judgment.  Have a responsible adult stay with you for the time you are told. It is important to have someone help care for you until you are awake and alert.  Rest as told. Do not drive or operate machinery. Do not drink alcohol or take sleeping pills.  Get help right away if you  have trouble breathing, or if you suddenly become confused. This information is not intended to replace advice given to you by your health care provider. Make sure you discuss any questions you have with your health care provider. Document Revised: 12/07/2019 Document Reviewed: 02/23/2019 Elsevier Patient Education  2021 [employer].

## 2020-07-24 ENCOUNTER — Encounter: Payer: Self-pay | Admitting: Orthopaedic Surgery

## 2020-07-24 ENCOUNTER — Ambulatory Visit: Payer: Self-pay

## 2020-07-24 ENCOUNTER — Other Ambulatory Visit: Payer: Self-pay

## 2020-07-24 ENCOUNTER — Ambulatory Visit (INDEPENDENT_AMBULATORY_CARE_PROVIDER_SITE_OTHER): Payer: Medicare Other | Admitting: Orthopaedic Surgery

## 2020-07-24 VITALS — Ht 61.0 in | Wt 256.0 lb

## 2020-07-24 DIAGNOSIS — G8929 Other chronic pain: Secondary | ICD-10-CM | POA: Diagnosis not present

## 2020-07-24 DIAGNOSIS — M545 Low back pain, unspecified: Secondary | ICD-10-CM | POA: Insufficient documentation

## 2020-07-24 DIAGNOSIS — M5441 Lumbago with sciatica, right side: Secondary | ICD-10-CM

## 2020-07-24 DIAGNOSIS — M79604 Pain in right leg: Secondary | ICD-10-CM | POA: Diagnosis not present
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
## 2020-07-25 ENCOUNTER — Encounter (HOSPITAL_COMMUNITY)
Admission: RE | Admit: 2020-07-25 | Discharge: 2020-07-25 | Disposition: A | Discharge status: Home / Self Care | Payer: Medicare Other | Source: Ambulatory Visit | Attending: Internal Medicine | Admitting: Internal Medicine

## 2020-07-25 ENCOUNTER — Other Ambulatory Visit (HOSPITAL_COMMUNITY)
Admission: RE | Admit: 2020-07-25 | Discharge: 2020-07-25 | Disposition: A | Discharge status: Home / Self Care | Payer: Medicare Other | Source: Ambulatory Visit | Attending: Internal Medicine | Admitting: Internal Medicine

## 2020-07-25 ENCOUNTER — Other Ambulatory Visit: Payer: Self-pay

## 2020-07-25 ENCOUNTER — Encounter (HOSPITAL_COMMUNITY): Payer: Self-pay

## 2020-07-25 DIAGNOSIS — Z01818 Encounter for other preprocedural examination: Secondary | ICD-10-CM | POA: Insufficient documentation

## 2020-07-25 DIAGNOSIS — Z20822 Contact with and (suspected) exposure to covid-19: Secondary | ICD-10-CM | POA: Insufficient documentation

## 2020-07-25 DIAGNOSIS — Z01812 Encounter for preprocedural laboratory examination: Secondary | ICD-10-CM | POA: Insufficient documentation

## 2020-07-25 LAB — SARS CORONAVIRUS 2 (TAT 6-24 HRS): SARS Coronavirus 2: NEGATIVE

## 2020-07-25 NOTE — Progress Notes (Signed)
Yesenia [name] brought patient to me for her Covid screening. Covid order was not released after PAT, so I released it.

## 2020-07-29 ENCOUNTER — Ambulatory Visit (HOSPITAL_COMMUNITY)
Admission: RE | Admit: 2020-07-29 | Discharge: 2020-07-29 | Disposition: A | Discharge status: Home / Self Care | Payer: Medicare Other | Attending: Internal Medicine | Admitting: Internal Medicine

## 2020-07-29 ENCOUNTER — Encounter (HOSPITAL_COMMUNITY)
Admission: RE | Disposition: A | Discharge status: Home / Self Care | Payer: Self-pay | Source: Home / Self Care | Attending: Internal Medicine

## 2020-07-29 ENCOUNTER — Encounter (HOSPITAL_COMMUNITY): Payer: Self-pay | Admitting: Internal Medicine

## 2020-07-29 ENCOUNTER — Ambulatory Visit (HOSPITAL_COMMUNITY): Payer: Medicare Other | Admitting: Anesthesiology

## 2020-07-29 DIAGNOSIS — K640 First degree hemorrhoids: Secondary | ICD-10-CM | POA: Insufficient documentation

## 2020-07-29 DIAGNOSIS — Z1211 Encounter for screening for malignant neoplasm of colon: Secondary | ICD-10-CM | POA: Diagnosis not present

## 2020-07-29 DIAGNOSIS — Z801 Family history of malignant neoplasm of trachea, bronchus and lung: Secondary | ICD-10-CM | POA: Diagnosis not present

## 2020-07-29 DIAGNOSIS — Z7983 Long term (current) use of bisphosphonates: Secondary | ICD-10-CM | POA: Diagnosis not present

## 2020-07-29 DIAGNOSIS — Z86010 Personal history of colonic polyps: Secondary | ICD-10-CM

## 2020-07-29 DIAGNOSIS — Z888 Allergy status to other drugs, medicaments and biological substances status: Secondary | ICD-10-CM | POA: Insufficient documentation

## 2020-07-29 DIAGNOSIS — K5730 Diverticulosis of large intestine without perforation or abscess without bleeding: Secondary | ICD-10-CM | POA: Diagnosis not present

## 2020-07-29 DIAGNOSIS — Z8249 Family history of ischemic heart disease and other diseases of the circulatory system: Secondary | ICD-10-CM | POA: Insufficient documentation

## 2020-07-29 DIAGNOSIS — Z9049 Acquired absence of other specified parts of digestive tract: Secondary | ICD-10-CM | POA: Diagnosis not present

## 2020-07-29 DIAGNOSIS — Z886 Allergy status to analgesic agent status: Secondary | ICD-10-CM | POA: Insufficient documentation

## 2020-07-29 DIAGNOSIS — E119 Type 2 diabetes mellitus without complications: Secondary | ICD-10-CM | POA: Insufficient documentation

## 2020-07-29 DIAGNOSIS — K317 Polyp of stomach and duodenum: Secondary | ICD-10-CM

## 2020-07-29 DIAGNOSIS — K3189 Other diseases of stomach and duodenum: Secondary | ICD-10-CM | POA: Diagnosis not present

## 2020-07-29 DIAGNOSIS — I10 Essential (primary) hypertension: Secondary | ICD-10-CM | POA: Diagnosis not present

## 2020-07-29 DIAGNOSIS — Z86711 Personal history of pulmonary embolism: Secondary | ICD-10-CM | POA: Diagnosis not present

## 2020-07-29 DIAGNOSIS — K760 Fatty (change of) liver, not elsewhere classified: Secondary | ICD-10-CM | POA: Insufficient documentation

## 2020-07-29 DIAGNOSIS — Z86718 Personal history of other venous thrombosis and embolism: Secondary | ICD-10-CM | POA: Diagnosis not present

## 2020-07-29 DIAGNOSIS — Z90710 Acquired absence of both cervix and uterus: Secondary | ICD-10-CM | POA: Diagnosis not present

## 2020-07-29 DIAGNOSIS — Z800 Family history of malignant neoplasm of digestive organs: Secondary | ICD-10-CM | POA: Diagnosis not present

## 2020-07-29 DIAGNOSIS — Z79890 Hormone replacement therapy (postmenopausal): Secondary | ICD-10-CM | POA: Insufficient documentation

## 2020-07-29 DIAGNOSIS — I5030 Unspecified diastolic (congestive) heart failure: Secondary | ICD-10-CM | POA: Diagnosis not present

## 2020-07-29 DIAGNOSIS — Q438 Other specified congenital malformations of intestine: Secondary | ICD-10-CM | POA: Diagnosis not present

## 2020-07-29 DIAGNOSIS — K449 Diaphragmatic hernia without obstruction or gangrene: Secondary | ICD-10-CM | POA: Insufficient documentation

## 2020-07-29 DIAGNOSIS — R1310 Dysphagia, unspecified: Secondary | ICD-10-CM

## 2020-07-29 DIAGNOSIS — Z79899 Other long term (current) drug therapy: Secondary | ICD-10-CM | POA: Insufficient documentation

## 2020-07-29 DIAGNOSIS — K219 Gastro-esophageal reflux disease without esophagitis: Secondary | ICD-10-CM | POA: Insufficient documentation

## 2020-07-29 HISTORY — PX: MALONEY DILATION: SHX5535

## 2020-07-29 HISTORY — PX: ESOPHAGOGASTRODUODENOSCOPY (EGD) WITH PROPOFOL: SHX5813

## 2020-07-29 HISTORY — PX: COLONOSCOPY WITH PROPOFOL: SHX5780

## 2020-07-29 HISTORY — PX: POLYPECTOMY: SHX5525

## 2020-07-29 LAB — GLUCOSE, CAPILLARY: Glucose-Capillary: 132 mg/dL — ABNORMAL HIGH (ref 70–99)

## 2020-07-29 SURGERY — COLONOSCOPY WITH PROPOFOL
Anesthesia: General

## 2020-07-29 MED ORDER — LIDOCAINE VISCOUS HCL 2 % MT SOLN
OROMUCOSAL | Status: AC
  Filled 2020-07-29: qty 15

## 2020-07-29 MED ORDER — LIDOCAINE HCL URETHRAL/MUCOSAL 2 % EX GEL
CUTANEOUS | Status: AC
  Filled 2020-07-29: qty 30

## 2020-07-29 MED ORDER — LACTATED RINGERS IV SOLN: INTRAVENOUS | Status: DC

## 2020-07-29 MED ORDER — PROPOFOL 500 MG/50ML IV EMUL
INTRAVENOUS | Status: DC | PRN
  Administered 2020-07-29: 150 ug/kg/min via INTRAVENOUS

## 2020-07-29 MED ORDER — LIDOCAINE VISCOUS HCL 2 % MT SOLN
OROMUCOSAL | Status: DC | PRN
  Administered 2020-07-29: 1 via OROMUCOSAL

## 2020-07-29 MED ORDER — LIDOCAINE HCL (CARDIAC) PF 100 MG/5ML IV SOSY
PREFILLED_SYRINGE | INTRAVENOUS | Status: DC | PRN
  Administered 2020-07-29: 50 mg via INTRAVENOUS

## 2020-07-29 MED ORDER — PROPOFOL 10 MG/ML IV BOLUS
INTRAVENOUS | Status: DC | PRN
  Administered 2020-07-29: 100 mg via INTRAVENOUS
  Administered 2020-07-29: 50 mg via INTRAVENOUS

## 2020-07-29 MED ORDER — LIDOCAINE HCL URETHRAL/MUCOSAL 2 % EX GEL
CUTANEOUS | Status: DC | PRN
  Administered 2020-07-29: 3 via TOPICAL

## 2020-07-29 NOTE — Op Note (Signed)
[facility] Patient Name: Yesenia [name] Procedure Date: 07/29/2020 9:44 AM MRN: [id number] Date of Birth: 01-Apr-1951 Attending MD: Norvel Richards , MD CSN: [id number] Age: 70 Admit Type: Outpatient Procedure:                Colonoscopy Indications:              High risk colon cancer surveillance: Personal                            history of colonic polyps Providers:                Norvel Richards, MD, Janeece Riggers, RN, Raphael Gibney, Technician Referring MD:              Medicines:                Propofol per Anesthesia Complications:            No immediate complications. Estimated Blood Loss:     Estimated blood loss: none. Procedure:                Pre-Anesthesia Assessment:                           - Prior to the procedure, a History and Physical                            was performed, and patient medications and                            allergies were reviewed. The patient's tolerance of                            previous anesthesia was also reviewed. The risks                            and benefits of the procedure and the sedation                            options and risks were discussed with the patient.                            All questions were answered, and informed consent                            was obtained. Prior Anticoagulants: The patient has                            taken no previous anticoagulant or antiplatelet                            agents. ASA Grade Assessment: III - A patient with  severe systemic disease. After reviewing the risks                            and benefits, the patient was deemed in                            satisfactory condition to undergo the procedure.                           After obtaining informed consent, the colonoscope                            was passed under direct vision. Throughout the                            procedure, the  patient's blood pressure, pulse, and                            oxygen saturations were monitored continuously. The                            CF-HQ190L ([id number]) scope was introduced through                            the anus and advanced to the the cecum, identified                            by appendiceal orifice and ileocecal valve. The                            colonoscopy was performed without difficulty. The                            patient tolerated the procedure well. The quality                            of the bowel preparation was adequate. Scope In: 9:50:05 AM Scope Out: 36:64:40 AM Scope Withdrawal Time: 0 hours 9 minutes 50 seconds  Total Procedure Duration: 0 hours 16 minutes 8 seconds  Findings:      Multiple small and large-mouthed diverticula were found in the sigmoid       colon and descending colon. Redundant colon. External abdominal pressure       required to reach the cecum.      Non-bleeding internal hemorrhoids were found during retroflexion. The       hemorrhoids were moderate, medium-sized and Grade I (internal       hemorrhoids that do not prolapse). (2) 1 cm lipomas ascending segment      The exam was otherwise without abnormality on direct and retroflexion       views. Impression:               - Diverticulosis in the sigmoid colon and in the                            descending colon.  Colonic lipomas. Redundant colon.                           - Non-bleeding internal hemorrhoids.                           - The examination was otherwise normal on direct                            and retroflexion views.                           - No specimens collected. Moderate Sedation:      Moderate (conscious) sedation was personally administered by an       anesthesia professional. The following parameters were monitored: oxygen       saturation, heart rate, blood pressure, respiratory rate, EKG, adequacy       of pulmonary ventilation, and response to  care. Recommendation:           - Patient has a contact number available for                            emergencies. The signs and symptoms of potential                            delayed complications were discussed with the                            patient. Return to normal activities tomorrow.                            Written discharge instructions were provided to the                            patient.                           - Resume previous diet.                           - Continue present medications.                           - Repeat colonoscopy in 5 years for surveillance.                            See EGD report.                           - Return to GI office in 3 months. Procedure Code(s):        --- Professional ---                           [phone], Colonoscopy, flexible; diagnostic, including                            collection of specimen(s) by brushing  or washing,                            when performed (separate procedure) Diagnosis Code(s):        --- Professional ---                           Z86.010, Personal history of colonic polyps                           K64.0, First degree hemorrhoids                           K57.30, Diverticulosis of large intestine without                            perforation or abscess without bleeding CPT copyright 2019 American Medical Association. All rights reserved. The codes documented in this report are preliminary and upon coder review may  be revised to meet current compliance requirements. Cristopher Estimable. Maisyn Nouri, MD Norvel Richards, MD 07/29/2020 10:10:53 AM This report has been signed electronically. Number of Addenda: 0

## 2020-07-29 NOTE — Anesthesia Procedure Notes (Signed)
Date/Time: 07/29/2020 9:38 AM Performed by: Orlie Dakin, CRNA Pre-anesthesia Checklist: Patient identified, Emergency Drugs available, Suction available and Patient being monitored Patient Re-evaluated:Patient Re-evaluated prior to induction Oxygen Delivery Method: Nasal cannula Induction Type: IV induction Placement Confirmation: positive ETCO2

## 2020-07-29 NOTE — Op Note (Signed)
[facility] Patient Name: Yesenia [name] Procedure Date: 07/29/2020 8:52 AM MRN: [id number] Date of Birth: [birth year] Attending MD: Norvel Richards , MD CSN: [id number] Age: 70 Admit Type: Outpatient Procedure:                Upper GI endoscopy Indications:              Dysphagia Providers:                Norvel Richards, MD, Janeece Riggers, RN, Raphael Gibney, Technician Referring MD:              Medicines:                Propofol per Anesthesia Complications:             Estimated Blood Loss:     Estimated blood loss was minimal. Procedure:                Pre-Anesthesia Assessment:                           - Prior to the procedure, a History and Physical                            was performed, and patient medications and                            allergies were reviewed. The patient's tolerance of                            previous anesthesia was also reviewed. The risks                            and benefits of the procedure and the sedation                            options and risks were discussed with the patient.                            All questions were answered, and informed consent                            was obtained. Prior Anticoagulants: The patient has                            taken no previous anticoagulant or antiplatelet                            agents. ASA Grade Assessment: III - A patient with                            severe systemic disease. After reviewing the risks  and benefits, the patient was deemed in                            satisfactory condition to undergo the procedure.                           After obtaining informed consent, the endoscope was                            passed under direct vision. Throughout the                            procedure, the patient's blood pressure, pulse, and                            oxygen saturations were monitored continuously.  The                            [id number]-[id number] ([id number]) scope was introduced through the                            mouth, and advanced to the second part of duodenum.                            The upper GI endoscopy was accomplished without                            difficulty. The patient tolerated the procedure                            well. Scope In: 9:20:41 AM Scope Out: 9:39:21 AM Total Procedure Duration: 0 hours 18 minutes 40 seconds  Findings:      The examined esophagus was normal.      A medium-sized hiatal hernia was present.      One 8 mm sessile polyp with no stigmata of recent bleeding was found in       the cardia. Antral mucosa was polypoid appearing with overlying       erosions. No ulcer or infiltrating process was seen. Patent pylorus.      The duodenal bulb and second portion of the duodenum were normal. The       scope was withdrawn. Dilation was performed with a Maloney dilator with       mild resistance at 56 Fr. The dilation site was examined following       endoscope reinsertion and showed no change. Estimated blood loss: none.       The polyp was removed with a hot snare. Resection and retrieval were       complete. Estimated blood loss was minimal. Finally, polypoid antral       mucosa was biopsied with a cold forceps for histology. Estimated blood       loss was minimal. No immediate complications.. . Impression:               - Normal esophagus. Dilated.                           -  Medium-sized hiatal hernia. - One gastric polyp.                            Resected and retrieved. Abnormal antral mucosa -                            status post biopsy                           - Normal duodenal bulb and second portion of the                            duodenum. Moderate Sedation:      Moderate (conscious) sedation was personally administered by an       anesthesia professional. The following parameters were monitored: oxygen       saturation, heart rate,  blood pressure, respiratory rate, EKG, adequacy       of pulmonary ventilation, and response to care. Recommendation:           - Patient has a contact number available for                            emergencies. The signs and symptoms of potential                            delayed complications were discussed with the                            patient. Return to normal activities tomorrow.                            Written discharge instructions were provided to the                            patient.                           - Advance diet as tolerated. Follow-up on                            pathology. Office visit with [place] in 3 months.                            Continue Dexilant 60 mg daily. See colonoscopy                            report. Procedure Code(s):        --- Professional ---                           [phone], Esophagogastroduodenoscopy, flexible,                            transoral; with removal of tumor(s), polyp(s), or  other lesion(s) by snare technique                           [id number], Dilation of esophagus, by unguided sound or                            bougie, single or multiple passes Diagnosis Code(s):        --- Professional ---                           K44.9, Diaphragmatic hernia without obstruction or                            gangrene                           K31.7, Polyp of stomach and duodenum                           R13.10, Dysphagia, unspecified CPT copyright 2019 American Medical Association. All rights reserved. The codes documented in this report are preliminary and upon coder review may  be revised to meet current compliance requirements. Cristopher Estimable. Sherika Kubicki, MD Norvel Richards, MD 07/29/2020 10:07:10 AM This report has been signed electronically. Number of Addenda: 0
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
## 2020-07-29 NOTE — Discharge Instructions (Signed)
Colonoscopy Discharge Instructions  Read the instructions outlined below and refer to this sheet in the next few weeks. These discharge instructions provide you with general information on caring for yourself after you leave the hospital. Your doctor may also give you specific instructions. While your treatment has been planned according to the most current medical practices available, unavoidable complications occasionally occur. If you have any problems or questions after discharge, call Dr. Gala Romney at [phone]. ACTIVITY  You may resume your regular activity, but move at a slower pace for the next 24 hours.   Take frequent rest periods for the next 24 hours.   Walking will help get rid of the air and reduce the bloated feeling in your belly (abdomen).   No driving for 24 hours (because of the medicine (anesthesia) used during the test).    Do not sign any important legal documents or operate any machinery for 24 hours (because of the anesthesia used during the test).  NUTRITION  Drink plenty of fluids.   You may resume your normal diet as instructed by your doctor.   Begin with a light meal and progress to your normal diet. Heavy or fried foods are harder to digest and may make you feel sick to your stomach (nauseated).   Avoid alcoholic beverages for 24 hours or as instructed.  MEDICATIONS  You may resume your normal medications unless your doctor tells you otherwise.  WHAT YOU CAN EXPECT TODAY  Some feelings of bloating in the abdomen.   Passage of more gas than usual.   Spotting of blood in your stool or on the toilet paper.  IF YOU HAD POLYPS REMOVED DURING THE COLONOSCOPY:  No aspirin products for 7 days or as instructed.   No alcohol for 7 days or as instructed.   Eat a soft diet for the next 24 hours.  FINDING OUT THE RESULTS OF YOUR TEST Not all test results are available during your visit. If your test results are not back during the visit, make an appointment  with your caregiver to find out the results. Do not assume everything is normal if you have not heard from your caregiver or the medical facility. It is important for you to follow up on all of your test results.  SEEK IMMEDIATE MEDICAL ATTENTION IF:  You have more than a spotting of blood in your stool.   Your belly is swollen (abdominal distention).   You are nauseated or vomiting.   You have a temperature over 101.   You have abdominal pain or discomfort that is severe or gets worse throughout the day.   EGD Discharge instructions Please read the instructions outlined below and refer to this sheet in the next few weeks. These discharge instructions provide you with general information on caring for yourself after you leave the hospital. Your doctor may also give you specific instructions. While your treatment has been planned according to the most current medical practices available, unavoidable complications occasionally occur. If you have any problems or questions after discharge, please call your doctor. ACTIVITY  You may resume your regular activity but move at a slower pace for the next 24 hours.   Take frequent rest periods for the next 24 hours.   Walking will help expel (get rid of) the air and reduce the bloated feeling in your abdomen.   No driving for 24 hours (because of the anesthesia (medicine) used during the test).   You may shower.   Do not sign any  important legal documents or operate any machinery for 24 hours (because of the anesthesia used during the test).  NUTRITION  Drink plenty of fluids.   You may resume your normal diet.   Begin with a light meal and progress to your normal diet.   Avoid alcoholic beverages for 24 hours or as instructed by your caregiver.  MEDICATIONS  You may resume your normal medications unless your caregiver tells you otherwise.  WHAT YOU CAN EXPECT TODAY  You may experience abdominal discomfort such as a feeling of  fullness or "gas" pains.  FOLLOW-UP  Your doctor will discuss the results of your test with you.  SEEK IMMEDIATE MEDICAL ATTENTION IF ANY OF THE FOLLOWING OCCUR:  Excessive nausea (feeling sick to your stomach) and/or vomiting.   Severe abdominal pain and distention (swelling).   Trouble swallowing.   Temperature over 101 F (37.8 C).   Rectal bleeding or vomiting of blood.    Diverticulosis  Diverticulosis is a condition that develops when small pouches (diverticula) form in the wall of the large intestine (colon). The colon is where water is absorbed and stool (feces) is formed. The pouches form when the inside layer of the colon pushes through weak spots in the outer layers of the colon. You may have a few pouches or many of them. The pouches usually do not cause problems unless they become inflamed or infected. When this happens, the condition is called diverticulitis. What are the causes? The cause of this condition is not known. What increases the risk? The following factors may make you more likely to develop this condition:  Being older than age 38. Your risk for this condition increases with age. Diverticulosis is rare among people younger than age 69. By age 67, many people have it.  Eating a low-fiber diet.  Having frequent constipation.  Being overweight.  Not getting enough exercise.  Smoking.  Taking over-the-counter pain medicines, like aspirin and ibuprofen.  Having a family history of diverticulosis. What are the signs or symptoms? In most people, there are no symptoms of this condition. If you do have symptoms, they may include:  Bloating.  Cramps in the abdomen.  Constipation or diarrhea.  Pain in the lower left side of the abdomen. How is this diagnosed? Because diverticulosis usually has no symptoms, it is most often diagnosed during an exam for other colon problems. The condition may be diagnosed by:  Using a flexible scope to examine the  colon (colonoscopy).  Taking an X-ray of the colon after dye has been put into the colon (barium enema).  Having a CT scan. How is this treated? You may not need treatment for this condition. Your health care provider may recommend treatment to prevent problems. You may need treatment if you have symptoms or if you previously had diverticulitis. Treatment may include:  Eating a high-fiber diet.  Taking a fiber supplement.  Taking a live bacteria supplement (probiotic).  Taking medicine to relax your colon.   Follow these instructions at home: Medicines  Take over-the-counter and prescription medicines only as told by your health care provider.  If told by your health care provider, take a fiber supplement or probiotic. Constipation prevention Your condition may cause constipation. To prevent or treat constipation, you may need to:  Drink enough fluid to keep your urine pale yellow.  Take over-the-counter or prescription medicines.  Eat foods that are high in fiber, such as beans, whole grains, and fresh fruits and vegetables.  Limit foods that  are high in fat and processed sugars, such as fried or sweet foods.   General instructions  Try not to strain when you have a bowel movement.  Keep all follow-up visits as told by your health care provider. This is important. Contact a health care provider if you:  Have pain in your abdomen.  Have bloating.  Have cramps.  Have not had a bowel movement in 3 days. Get help right away if:  Your pain gets worse.  Your bloating becomes very bad.  You have a fever or chills, and your symptoms suddenly get worse.  You vomit.  You have bowel movements that are bloody or black.  You have bleeding from your rectum. Summary  Diverticulosis is a condition that develops when small pouches (diverticula) form in the wall of the large intestine (colon).  You may have a few pouches or many of them.  This condition is most often  diagnosed during an exam for other colon problems.  Treatment may include increasing the fiber in your diet, taking supplements, or taking medicines. This information is not intended to replace advice given to you by your health care provider. Make sure you discuss any questions you have with your health care provider. Document Revised: 10/20/2018 Document Reviewed: 10/20/2018 Elsevier Patient Education  [place].   High-Fiber Eating Plan Fiber, also called dietary fiber, is a type of carbohydrate. It is found foods such as fruits, vegetables, whole grains, and beans. A high-fiber diet can have many health benefits. Your health care provider may recommend a high-fiber diet to help:  Prevent constipation. Fiber can make your bowel movements more regular.  Lower your cholesterol.  Relieve the following conditions: ? Inflammation of veins in the anus (hemorrhoids). ? Inflammation of specific areas of the digestive tract (uncomplicated diverticulosis). ? A problem of the large intestine, also called the colon, that sometimes causes pain and diarrhea (irritable bowel syndrome, or IBS).  Prevent overeating as part of a weight-loss plan.  Prevent heart disease, type 2 diabetes, and certain cancers. What are tips for following this plan? Reading food labels  Check the nutrition facts label on food products for the amount of dietary fiber. Choose foods that have 5 grams of fiber or more per serving.  The goals for recommended daily fiber intake include: ? Men (age 37 or younger): 34-38 g. ? Men (over age 62): 28-34 g. ? Women (age 44 or younger): 25-28 g. ? Women (over age 13): 22-25 g. Your daily fiber goal is _____________ g.   Shopping  Choose whole fruits and vegetables instead of processed forms, such as apple juice or applesauce.  Choose a wide variety of high-fiber foods such as avocados, lentils, oats, and kidney beans.  Read the nutrition facts label of the foods you  choose. Be aware of foods with added fiber. These foods often have high sugar and sodium amounts per serving. Cooking  Use whole-grain flour for baking and cooking.  Cook with brown rice instead of white rice. Meal planning  Start the day with a breakfast that is high in fiber, such as a cereal that contains 5 g of fiber or more per serving.  Eat breads and cereals that are made with whole-grain flour instead of refined flour or white flour.  Eat brown rice, bulgur wheat, or millet instead of white rice.  Use beans in place of meat in soups, salads, and pasta dishes.  Be sure that half of the grains you eat each day  are whole grains. General information  You can get the recommended daily intake of dietary fiber by: ? Eating a variety of fruits, vegetables, grains, nuts, and beans. ? Taking a fiber supplement if you are not able to take in enough fiber in your diet. It is better to get fiber through food than from a supplement.  Gradually increase how much fiber you consume. If you increase your intake of dietary fiber too quickly, you may have bloating, cramping, or gas.  Drink plenty of water to help you digest fiber.  Choose high-fiber snacks, such as berries, raw vegetables, nuts, and popcorn. What foods should I eat? Fruits Berries. Pears. Apples. Oranges. Avocado. Prunes and raisins. Dried figs. Vegetables Sweet potatoes. Spinach. Kale. Artichokes. Cabbage. Broccoli. Cauliflower. Green peas. Carrots. Squash. Grains Whole-grain breads. Multigrain cereal. Oats and oatmeal. Brown rice. Barley. Bulgur wheat. [place]. Quinoa. Bran muffins. Popcorn. Rye wafer crackers. Meats and other proteins Navy beans, kidney beans, and pinto beans. Soybeans. Split peas. Lentils. Nuts and seeds. Dairy Fiber-fortified yogurt. Beverages Fiber-fortified soy milk. Fiber-fortified orange juice. Other foods Fiber bars. The items listed above may not be a complete list of recommended foods and  beverages. Contact a dietitian for more information. What foods should I avoid? Fruits Fruit juice. Cooked, strained fruit. Vegetables Fried potatoes. Canned vegetables. Well-cooked vegetables. Grains White bread. Pasta made with refined flour. White rice. Meats and other proteins Fatty cuts of meat. Fried chicken or fried fish. Dairy Milk. Yogurt. Cream cheese. Sour cream. Fats and oils Butters. Beverages Soft drinks. Other foods Cakes and pastries. The items listed above may not be a complete list of foods and beverages to avoid. Talk with your dietitian about what choices are best for you. Summary  Fiber is a type of carbohydrate. It is found in foods such as fruits, vegetables, whole grains, and beans.  A high-fiber diet has many benefits. It can help to prevent constipation, lower blood cholesterol, aid weight loss, and reduce your risk of heart disease, diabetes, and certain cancers.  Increase your intake of fiber gradually. Increasing fiber too quickly may cause cramping, bloating, and gas. Drink plenty of water while you increase the amount of fiber you consume.  The best sources of fiber include whole fruits and vegetables, whole grains, nuts, seeds, and beans. This information is not intended to replace advice given to you by your health care provider. Make sure you discuss any questions you have with your health care provider. Document Revised: 07/27/2019 Document Reviewed: 07/27/2019 Elsevier Patient Education  2021 [place].    No polyps found in your colon today.  Diverticulosis information provided  Repeat colonoscopy in 5 years recommended  Esophagus was dilated today.  1 stomach polyp removed.  Stomach inflamed biopsies taken  Continue Dexilant and Linzess daily  Further recommendations to follow pending review of pathology report  Office visit with [place] in 3 months  At patient request, called Jacalyn Lefevre at [phone] results and  recommendations.

## 2020-07-29 NOTE — Anesthesia Preprocedure Evaluation (Signed)
Anesthesia Evaluation  Patient identified by MRN, date of birth, ID band Patient awake    Reviewed: Allergy & Precautions, NPO status , Patient's Chart, lab work & pertinent test results  History of Anesthesia Complications (+) PROLONGED EMERGENCE and history of anesthetic complications  Airway Mallampati: III  TM Distance: >3 FB Neck ROM: Full    Dental  (+) Edentulous Lower, Edentulous Upper   Pulmonary shortness of breath and with exertion, sleep apnea , pneumonia,    Pulmonary exam normal        Cardiovascular Exercise Tolerance: Poor hypertension, Pt. on medications Normal cardiovascular exam+ Valvular Problems/Murmurs  Rhythm:Regular Rate:Normal  Impressions:  - Mild basal septal LV hypertrophy with LVEF 60-65% and grade 1   diastolic dysfunction. Mild to moderate mitral annular   calcification with trivial mitral regurgitation. Trivial   tricuspid regurgitation. EKG- SR,LAFB   Neuro/Psych PSYCHIATRIC DISORDERS Anxiety Depression  Neuromuscular disease    GI/Hepatic hiatal hernia, GERD  Medicated,  Endo/Other  diabetes, Well Controlled, Type 2Hypothyroidism   Renal/GU Renal disease     Musculoskeletal  (+) Arthritis , Fibromyalgia -  Abdominal   Peds  Hematology  (+) anemia ,   Anesthesia Other Findings   Reproductive/Obstetrics                             Anesthesia Physical  Anesthesia Plan  ASA: III  Anesthesia Plan: General   Post-op Pain Management:    Induction:   PONV Risk Score and Plan: Propofol infusion  Airway Management Planned: Nasal Cannula and Natural Airway  Additional Equipment:   Intra-op Plan:   Post-operative Plan:   Informed Consent: I have reviewed the patients History and Physical, chart, labs and discussed the procedure including the risks, benefits and alternatives for the proposed anesthesia with the patient or authorized  representative who has indicated his/her understanding and acceptance.       Plan Discussed with: CRNA and Surgeon  Anesthesia Plan Comments:         Anesthesia Quick Evaluation

## 2020-07-29 NOTE — Transfer of Care (Signed)
Immediate Anesthesia Transfer of Care Note  Patient: Yesenia [name]  Procedure(s) Performed: COLONOSCOPY WITH PROPOFOL (N/A ) ESOPHAGOGASTRODUODENOSCOPY (EGD) WITH PROPOFOL (N/A ) MALONEY DILATION (N/A ) POLYPECTOMY  Patient Location: PACU  Anesthesia Type:General  Level of Consciousness: drowsy  Airway & Oxygen Therapy: Patient Spontanous Breathing and Patient connected to nasal cannula oxygen  Post-op Assessment: Report given to RN and Post -op Vital signs reviewed and stable  Post vital signs: Reviewed and stable  Last Vitals:  Vitals Value Taken Time  BP    Temp    Pulse 75 07/29/20 1011  Resp    SpO2 94 % 07/29/20 1011  Vitals shown include unvalidated device data.  Last Pain: There were no vitals filed for this visit.       Complications: No complications documented.

## 2020-07-29 NOTE — Anesthesia Postprocedure Evaluation (Signed)
Anesthesia Post Note  Patient: Yesenia [name]  Procedure(s) Performed: COLONOSCOPY WITH PROPOFOL (N/A ) ESOPHAGOGASTRODUODENOSCOPY (EGD) WITH PROPOFOL (N/A ) MALONEY DILATION (N/A ) POLYPECTOMY  Patient location during evaluation: PACU Anesthesia Type: General Level of consciousness: awake and alert and oriented Pain management: pain level controlled Vital Signs Assessment: post-procedure vital signs reviewed and stable Respiratory status: spontaneous breathing and respiratory function stable Cardiovascular status: blood pressure returned to baseline and stable Postop Assessment: no apparent nausea or vomiting Anesthetic complications: no   No complications documented.   Last Vitals:  Vitals:   07/29/20 1015 07/29/20 1030  BP: 135/82 (!) 171/98  Pulse: 77 71  Resp: 16 20  Temp: 37.1 C 36.7 C  SpO2: 99% 97%    Last Pain:  Vitals:   07/29/20 1030  TempSrc: Oral  PainSc: 4                  Jaleeyah Munce C Avyn Aden

## 2020-07-30 ENCOUNTER — Other Ambulatory Visit (HOSPITAL_COMMUNITY): Payer: Self-pay | Admitting: Internal Medicine

## 2020-07-30 DIAGNOSIS — Z1231 Encounter for screening mammogram for malignant neoplasm of breast: Secondary | ICD-10-CM

## 2020-07-30 LAB — SURGICAL PATHOLOGY

## 2020-07-31 ENCOUNTER — Encounter (HOSPITAL_COMMUNITY): Payer: Self-pay | Admitting: Internal Medicine

## 2020-08-02 ENCOUNTER — Encounter: Payer: Self-pay | Admitting: Internal Medicine

## 2020-08-06 ENCOUNTER — Telehealth: Payer: Self-pay | Admitting: Orthopaedic Surgery

## 2020-08-06 ENCOUNTER — Other Ambulatory Visit: Payer: Self-pay | Admitting: Orthopaedic Surgery

## 2020-08-06 MED ORDER — DIAZEPAM 10 MG PO TABS: 10.0000 mg | ORAL_TABLET | Freq: Once | ORAL | 0 refills | Status: AC

## 2020-08-06 NOTE — Telephone Encounter (Signed)
Pt called stating she is extremely claustrophobic and she has an MRI on 08/09/20 so she would like to have something called into [facility] to help her relax. Pt would like to be notified when this has been sent in.   [phone]

## 2020-08-06 NOTE — Telephone Encounter (Signed)
Called and notified patient.

## 2020-08-06 NOTE — Telephone Encounter (Signed)
Valium 10 mg called into [facility]

## 2020-08-09 ENCOUNTER — Encounter (HOSPITAL_COMMUNITY): Payer: Self-pay | Admitting: Emergency Medicine

## 2020-08-09 ENCOUNTER — Other Ambulatory Visit: Payer: Self-pay

## 2020-08-09 ENCOUNTER — Encounter (HOSPITAL_COMMUNITY): Payer: Self-pay

## 2020-08-09 ENCOUNTER — Observation Stay (HOSPITAL_COMMUNITY)
Admission: EM | Admit: 2020-08-09 | Discharge: 2020-08-09 | Disposition: A | Discharge status: Home / Self Care | Payer: Medicare Other | Attending: Internal Medicine | Admitting: Internal Medicine

## 2020-08-09 ENCOUNTER — Emergency Department (HOSPITAL_COMMUNITY): Payer: Medicare Other

## 2020-08-09 ENCOUNTER — Ambulatory Visit (HOSPITAL_COMMUNITY): Payer: Medicare Other

## 2020-08-09 ENCOUNTER — Observation Stay (HOSPITAL_COMMUNITY): Admission: RE | Admit: 2020-08-09 | Payer: Medicare Other | Source: Ambulatory Visit

## 2020-08-09 ENCOUNTER — Observation Stay (HOSPITAL_BASED_OUTPATIENT_CLINIC_OR_DEPARTMENT_OTHER): Payer: Medicare Other

## 2020-08-09 ENCOUNTER — Ambulatory Visit (HOSPITAL_COMMUNITY): Payer: Medicare Other | Attending: Orthopaedic Surgery

## 2020-08-09 DIAGNOSIS — M797 Fibromyalgia: Secondary | ICD-10-CM | POA: Diagnosis not present

## 2020-08-09 DIAGNOSIS — I5189 Other ill-defined heart diseases: Secondary | ICD-10-CM

## 2020-08-09 DIAGNOSIS — N182 Chronic kidney disease, stage 2 (mild): Secondary | ICD-10-CM | POA: Diagnosis not present

## 2020-08-09 DIAGNOSIS — R0789 Other chest pain: Principal | ICD-10-CM | POA: Insufficient documentation

## 2020-08-09 DIAGNOSIS — I10 Essential (primary) hypertension: Secondary | ICD-10-CM | POA: Diagnosis present

## 2020-08-09 DIAGNOSIS — I129 Hypertensive chronic kidney disease with stage 1 through stage 4 chronic kidney disease, or unspecified chronic kidney disease: Secondary | ICD-10-CM | POA: Insufficient documentation

## 2020-08-09 DIAGNOSIS — R079 Chest pain, unspecified: Secondary | ICD-10-CM

## 2020-08-09 DIAGNOSIS — R1319 Other dysphagia: Secondary | ICD-10-CM

## 2020-08-09 DIAGNOSIS — E1122 Type 2 diabetes mellitus with diabetic chronic kidney disease: Secondary | ICD-10-CM | POA: Diagnosis not present

## 2020-08-09 DIAGNOSIS — R110 Nausea: Secondary | ICD-10-CM | POA: Diagnosis not present

## 2020-08-09 DIAGNOSIS — E039 Hypothyroidism, unspecified: Secondary | ICD-10-CM | POA: Diagnosis not present

## 2020-08-09 DIAGNOSIS — R1310 Dysphagia, unspecified: Secondary | ICD-10-CM

## 2020-08-09 DIAGNOSIS — E1149 Type 2 diabetes mellitus with other diabetic neurological complication: Secondary | ICD-10-CM | POA: Diagnosis present

## 2020-08-09 DIAGNOSIS — Z955 Presence of coronary angioplasty implant and graft: Secondary | ICD-10-CM | POA: Insufficient documentation

## 2020-08-09 DIAGNOSIS — Z79899 Other long term (current) drug therapy: Secondary | ICD-10-CM | POA: Diagnosis not present

## 2020-08-09 LAB — ECHOCARDIOGRAM COMPLETE
AR max vel: 2.69 cm2
AV Area VTI: 2.73 cm2
AV Area mean vel: 3.11 cm2
AV Mean grad: 3.1 mmHg
AV Peak grad: 6.9 mmHg
Ao pk vel: 1.31 m/s
Area-P 1/2: 3.61 cm2
Height: 61 in
S' Lateral: 2.65 cm
Single Plane A4C EF: 79.5 %
Weight: 4031.77 oz

## 2020-08-09 LAB — CBC
HCT: 39.1 % (ref 36.0–46.0)
Hemoglobin: 11.2 g/dL — ABNORMAL LOW (ref 12.0–15.0)
MCH: 23.3 pg — ABNORMAL LOW (ref 26.0–34.0)
MCHC: 28.6 g/dL — ABNORMAL LOW (ref 30.0–36.0)
MCV: 81.3 fL (ref 80.0–100.0)
Platelets: 272 10*3/uL (ref 150–400)
RBC: 4.81 MIL/uL (ref 3.87–5.11)
RDW: 19.9 % — ABNORMAL HIGH (ref 11.5–15.5)
WBC: 6 10*3/uL (ref 4.0–10.5)
nRBC: 0 % (ref 0.0–0.2)

## 2020-08-09 LAB — TROPONIN I (HIGH SENSITIVITY)
Troponin I (High Sensitivity): 7 ng/L (ref <18)
Troponin I (High Sensitivity): 6 ng/L (ref <18)
Troponin I (High Sensitivity): 6 ng/L (ref <18)

## 2020-08-09 LAB — BASIC METABOLIC PANEL
Anion gap: 7 (ref 5–15)
BUN: 22 mg/dL (ref 8–23)
CO2: 26 mmol/L (ref 22–32)
Calcium: 10.2 mg/dL (ref 8.9–10.3)
Chloride: 104 mmol/L (ref 98–111)
Creatinine, Ser: 1.05 mg/dL — ABNORMAL HIGH (ref 0.44–1.00)
GFR, Estimated: 58 mL/min — ABNORMAL LOW (ref >60)
Glucose, Bld: 133 mg/dL — ABNORMAL HIGH (ref 70–99)
Potassium: 3.7 mmol/L (ref 3.5–5.1)
Sodium: 137 mmol/L (ref 135–145)

## 2020-08-09 LAB — D-DIMER, QUANTITATIVE: D-Dimer, Quant: 0.31 ug/mL-FEU (ref 0.00–0.50)

## 2020-08-09 MED ORDER — CALCIUM CARBONATE ANTACID 500 MG PO CHEW: 1.0000 | CHEWABLE_TABLET | Freq: Two times a day (BID) | ORAL | 1 refills | Status: DC

## 2020-08-09 MED ORDER — KETOROLAC TROMETHAMINE 30 MG/ML IJ SOLN
15.0000 mg | Freq: Once | INTRAMUSCULAR | Status: AC
  Administered 2020-08-09: 15 mg via INTRAVENOUS
  Filled 2020-08-09: qty 1

## 2020-08-09 MED ORDER — DEXLANSOPRAZOLE 60 MG PO CPDR: 60.0000 mg | DELAYED_RELEASE_CAPSULE | Freq: Every day | ORAL | 3 refills | Status: DC

## 2020-08-09 MED ORDER — NITROGLYCERIN 0.4 MG SL SUBL
0.4000 mg | SUBLINGUAL_TABLET | Freq: Once | SUBLINGUAL | Status: AC
  Administered 2020-08-09: 0.4 mg via SUBLINGUAL
  Filled 2020-08-09: qty 1

## 2020-08-09 MED ORDER — AZITHROMYCIN 500 MG PO TABS: 500.0000 mg | ORAL_TABLET | Freq: Every day | ORAL | 0 refills | Status: AC

## 2020-08-09 MED ORDER — ASPIRIN 81 MG PO CHEW
324.0000 mg | CHEWABLE_TABLET | Freq: Once | ORAL | Status: AC
  Administered 2020-08-09: 324 mg via ORAL
  Filled 2020-08-09: qty 4

## 2020-08-09 MED ORDER — ALUM & MAG HYDROXIDE-SIMETH 200-200-20 MG/5ML PO SUSP
30.0000 mL | Freq: Once | ORAL | Status: AC
  Administered 2020-08-09: 30 mL via ORAL
  Filled 2020-08-09: qty 30

## 2020-08-09 MED ORDER — LIDOCAINE VISCOUS HCL 2 % MT SOLN
15.0000 mL | Freq: Once | OROMUCOSAL | Status: AC
  Administered 2020-08-09: 15 mL via ORAL
  Filled 2020-08-09: qty 15
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
## 2020-08-09 NOTE — Discharge Instructions (Signed)
1)Avoid ibuprofen/Advil/Aleve/Motrin/Goody Powders/Naproxen/BC powders/Meloxicam/Diclofenac/Indomethacin and other Nonsteroidal anti-inflammatory medications as these will make you more likely to bleed and can cause stomach ulcers, can also cause Kidney problems.   2)follow up with cardiologist on Sep 03, 2020 as scheduled for recheck and reevaluation  3) please note that your medications have been slightly adjusted, azithromycin has been prescribed for presumed bronchitis

## 2020-08-09 NOTE — Progress Notes (Signed)
  Echocardiogram 2D Echocardiogram has been performed.  Yesenia [name] 08/09/2020, 3:06 PM

## 2020-08-09 NOTE — ED Triage Notes (Signed)
Pt c/o chest pain and nausea x 1 hour. States her left arm feels heavier than the right

## 2020-08-09 NOTE — Consult Note (Signed)
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 normal function. The left ventricle has no regional wall motion abnormalities. There is mild left ventricular hypertrophy. Left ventricular diastolic parameters are consistent with Grade I diastolic dysfunction (impaired relaxation).  2. Right ventricular systolic function is normal. The right ventricular size is normal.  3. Left atrial size was moderately dilated.  4. The mitral valve was not well visualized. No evidence of mitral valve regurgitation. No evidence of mitral stenosis.  5. The aortic valve was not well visualized. There is mild calcification of the aortic valve. There is mild thickening of the aortic valve. Aortic valve regurgitation is not visualized. No aortic stenosis is present. FINDINGS  Left  Ventricle: Left ventricular ejection fraction, by estimation, is 65 to 70%. The left ventricle has normal function. The left ventricle has no regional wall motion abnormalities. The left ventricular internal cavity size was normal in size. There is  mild left ventricular hypertrophy. Left ventricular diastolic parameters are consistent with Grade I diastolic dysfunction (impaired relaxation). Indeterminate filling pressures. Right Ventricle: The right ventricular size is normal. No increase in right ventricular wall thickness. Right ventricular systolic function is normal. Left Atrium: Left atrial size was moderately dilated. Right Atrium: Right atrial size was not well visualized. Pericardium: There is no evidence of pericardial effusion. Mitral Valve: The mitral valve was not well visualized. There is mild thickening of the mitral valve leaflet(s). There is mild calcification of the mitral valve leaflet(s). Mild mitral annular calcification. No evidence of mitral valve regurgitation. No evidence of mitral valve stenosis. Tricuspid Valve: The tricuspid valve is normal in structure. Tricuspid valve  regurgitation is not demonstrated. No evidence of tricuspid stenosis. Aortic Valve: The aortic valve was not well visualized. There is mild calcification of the aortic valve. There is mild thickening of the aortic valve. There is mild aortic valve annular calcification. Aortic valve regurgitation is not visualized. No aortic stenosis is present. Aortic valve mean gradient measures 3.1 mmHg. Aortic valve peak gradient measures 6.9 mmHg. Aortic valve area, by VTI measures 2.73 cm. Pulmonic Valve: The pulmonic valve was not well visualized. Pulmonic valve regurgitation is not visualized. No evidence of pulmonic stenosis. Aorta: The aortic root is normal in size and structure. IAS/Shunts: The interatrial septum was not well visualized.  LEFT VENTRICLE PLAX 2D LVIDd:         4.95 cm     Diastology LVIDs:         2.65 cm      LV e' medial:    4.82 cm/s LV PW:         1.20 cm     LV E/e' medial:  14.7 LV IVS:        1.20 cm     LV e' lateral:   6.66 cm/s LVOT diam:     2.20 cm     LV E/e' lateral: 10.6 LV SV:         59 LV SV Index:   28 LVOT Area:     3.80 cm  LV Volumes (MOD) LV vol d, MOD A4C: 51.1 ml LV vol s, MOD A4C: 10.5 ml LV SV MOD A4C:     51.1 ml RIGHT VENTRICLE RV Basal diam:  2.90 cm RV S prime:     9.82 cm/s TAPSE (M-mode): 2.4 cm LEFT ATRIUM             Index       RIGHT ATRIUM           Index LA diam:        4.40 cm 2.11 cm/m  RA Area:     16.10 cm LA Vol (A2C):   51.5 ml 24.72 ml/m RA Volume:   40.80 ml  19.58 ml/m LA Vol (A4C):   84.6 ml 40.60 ml/m LA Biplane Vol: 71.5 ml 34.31 ml/m  AORTIC VALVE AV Area (Vmax):    2.69 cm AV Area (Vmean):   3.11 cm AV Area (VTI):     2.73 cm AV Vmax:           131.39 cm/s AV Vmean:          80.124 cm/s AV VTI:            0.217 m AV Peak Grad:      6.9 mmHg AV Mean Grad:      3.1 mmHg LVOT Vmax:         93.10 cm/s LVOT Vmean:        65.500 cm/s LVOT VTI:          0.156 m LVOT/AV VTI ratio: 0.72  AORTA Ao Root diam: 2.80 cm MITRAL VALVE MV Area (PHT): 3.61 cm     SHUNTS MV Decel Time: 210 msec     Systemic VTI:  0.16 m MV E velocity: 70.90 cm/s   Systemic Diam: 2.20 cm MV A velocity: 117.00 cm/s MV E/A ratio:  0.61 Carlyle Dolly MD Electronically signed by Carlyle Dolly MD Signature Date/Time: 08/09/2020/3:29:10 PM    Final    Family Communication: Admission, patients condition and plan of care including tests being ordered have been discussed with  the patient  who indicate understanding and agree with the plan   Code Status - Full Code  Likely DC to  Home   Condition   stable  Roxan Hockey M.D on 08/09/2020 at 3:47 PM Go to [email or web address] -  for contact info  [facility] - Office  [phone]

## 2020-08-21 ENCOUNTER — Other Ambulatory Visit: Payer: Self-pay

## 2020-08-21 ENCOUNTER — Ambulatory Visit: Payer: Medicare Other | Admitting: Orthopaedic Surgery

## 2020-08-22 ENCOUNTER — Telehealth: Payer: Self-pay
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
## 2020-09-03 ENCOUNTER — Ambulatory Visit: Payer: Medicare Other | Admitting: Physician Assistant

## 2020-09-03 DIAGNOSIS — Z86711 Personal history of pulmonary embolism: Secondary | ICD-10-CM

## 2020-09-03 DIAGNOSIS — I10 Essential (primary) hypertension: Secondary | ICD-10-CM

## 2020-09-03 DIAGNOSIS — E785 Hyperlipidemia, unspecified: Secondary | ICD-10-CM

## 2020-09-03 DIAGNOSIS — R072 Precordial pain: Secondary | ICD-10-CM

## 2020-09-19 ENCOUNTER — Encounter: Payer: Self-pay | Admitting: Internal Medicine

## 2020-10-24 DIAGNOSIS — M797 Fibromyalgia: Secondary | ICD-10-CM | POA: Diagnosis not present

## 2020-10-24 DIAGNOSIS — K589 Irritable bowel syndrome without diarrhea: Secondary | ICD-10-CM | POA: Diagnosis not present

## 2020-10-24 DIAGNOSIS — N1831 Chronic kidney disease, stage 3a: Secondary | ICD-10-CM | POA: Diagnosis not present

## 2020-10-24 DIAGNOSIS — Z79899 Other long term (current) drug therapy: Secondary | ICD-10-CM | POA: Diagnosis not present

## 2020-10-24 DIAGNOSIS — E039 Hypothyroidism, unspecified: Secondary | ICD-10-CM | POA: Diagnosis not present

## 2020-10-24 DIAGNOSIS — R7303 Prediabetes: Secondary | ICD-10-CM | POA: Diagnosis not present

## 2020-10-24 DIAGNOSIS — I10 Essential (primary) hypertension: Secondary | ICD-10-CM | POA: Diagnosis not present

## 2020-10-24 DIAGNOSIS — E785 Hyperlipidemia, unspecified: Secondary | ICD-10-CM | POA: Diagnosis not present

## 2020-10-24 DIAGNOSIS — G8929 Other chronic pain: Secondary | ICD-10-CM | POA: Diagnosis not present

## 2020-10-24 DIAGNOSIS — K219 Gastro-esophageal reflux disease without esophagitis: Secondary | ICD-10-CM | POA: Diagnosis not present

## 2020-10-28 ENCOUNTER — Ambulatory Visit: Payer: Medicare Other | Admitting: Gastroenterology

## 2020-11-08 DIAGNOSIS — R0781 Pleurodynia: Secondary | ICD-10-CM | POA: Diagnosis not present

## 2020-11-08 DIAGNOSIS — S3992XA Unspecified injury of lower back, initial encounter: Secondary | ICD-10-CM | POA: Diagnosis not present

## 2020-11-08 DIAGNOSIS — Z981 Arthrodesis status: Secondary | ICD-10-CM | POA: Diagnosis not present

## 2020-11-08 DIAGNOSIS — M47814 Spondylosis without myelopathy or radiculopathy, thoracic region: Secondary | ICD-10-CM | POA: Diagnosis not present

## 2020-11-08 DIAGNOSIS — M545 Low back pain, unspecified: Secondary | ICD-10-CM | POA: Diagnosis not present

## 2020-11-08 DIAGNOSIS — M47816 Spondylosis without myelopathy or radiculopathy, lumbar region: Secondary | ICD-10-CM | POA: Diagnosis not present

## 2020-11-21 DIAGNOSIS — R002 Palpitations: Secondary | ICD-10-CM | POA: Diagnosis not present

## 2020-11-21 DIAGNOSIS — I509 Heart failure, unspecified: Secondary | ICD-10-CM | POA: Diagnosis not present

## 2020-11-21 DIAGNOSIS — G8929 Other chronic pain: Secondary | ICD-10-CM | POA: Diagnosis not present

## 2020-11-21 DIAGNOSIS — K589 Irritable bowel syndrome without diarrhea: Secondary | ICD-10-CM | POA: Diagnosis not present

## 2020-11-21 DIAGNOSIS — N1831 Chronic kidney disease, stage 3a: Secondary | ICD-10-CM | POA: Diagnosis not present

## 2020-11-21 DIAGNOSIS — Z79899 Other long term (current) drug therapy: Secondary | ICD-10-CM | POA: Diagnosis not present

## 2020-11-21 DIAGNOSIS — I10 Essential (primary) hypertension: Secondary | ICD-10-CM | POA: Diagnosis not present

## 2020-11-21 DIAGNOSIS — R600 Localized edema: Secondary | ICD-10-CM | POA: Diagnosis not present

## 2020-11-21 DIAGNOSIS — R7303 Prediabetes: Secondary | ICD-10-CM | POA: Diagnosis not present

## 2020-11-28 ENCOUNTER — Other Ambulatory Visit (HOSPITAL_COMMUNITY): Payer: Self-pay | Admitting: Sports Medicine

## 2020-11-28 DIAGNOSIS — Z1231 Encounter for screening mammogram for malignant neoplasm of breast: Secondary | ICD-10-CM

## 2020-12-12 DIAGNOSIS — Z23 Encounter for immunization: Secondary | ICD-10-CM | POA: Diagnosis not present

## 2020-12-12 DIAGNOSIS — I509 Heart failure, unspecified: Secondary | ICD-10-CM | POA: Diagnosis not present

## 2021-01-27 ENCOUNTER — Encounter: Payer: Self-pay | Admitting: Gastroenterology
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
## 2021-01-29 ENCOUNTER — Ambulatory Visit: Payer: Medicare Other | Admitting: Gastroenterology

## 2021-03-12 ENCOUNTER — Other Ambulatory Visit: Payer: Self-pay

## 2021-03-15 MED ORDER — DEXLANSOPRAZOLE 60 MG PO CPDR: 60.0000 mg | DELAYED_RELEASE_CAPSULE | Freq: Every day | ORAL | 11 refills | Status: DC

## 2021-05-01 ENCOUNTER — Other Ambulatory Visit: Payer: Self-pay | Admitting: Internal Medicine

## 2021-07-22 ENCOUNTER — Other Ambulatory Visit: Payer: Self-pay | Admitting: Orthopaedic Surgery

## 2021-07-22 DIAGNOSIS — M79604 Pain in right leg: Secondary | ICD-10-CM

## 2021-09-16 ENCOUNTER — Other Ambulatory Visit: Payer: Self-pay | Admitting: Internal Medicine

## 2021-09-17 NOTE — Telephone Encounter (Signed)
Lmom notifying pt to call the office to schedule appt.

## 2021-11-12 ENCOUNTER — Other Ambulatory Visit: Payer: Self-pay | Admitting: *Deleted

## 2021-11-12 NOTE — Patient Outreach (Signed)
  Care Coordination   11/12/2021 Name: TIYE HUWE MRN: [id number] DOB: December 06, 1950   Care Coordination Outreach Attempts:  An unsuccessful telephone outreach was attempted today to offer the patient information about available care coordination services as a benefit of their health plan.   Follow Up Plan:  Additional outreach attempts will be made to offer the patient care coordination information and services.   Encounter Outcome:  No Answer  Care Coordination Interventions Activated:  Yes   Care Coordination Interventions:  No, not indicated    [facility] [phone]

## 2021-12-16 ENCOUNTER — Other Ambulatory Visit: Payer: Self-pay | Admitting: *Deleted

## 2021-12-16 NOTE — Patient Outreach (Signed)
  Care Coordination   12/16/2021  Name: AZAELA CARACCI MRN: [id number] DOB: [birth year]   Care Coordination Outreach Attempts:  A second unsuccessful outreach was attempted today to offer the patient with information about available care coordination services as a benefit of their health plan.   HIPAA compliant message left on voicemail, providing contact information for CSW, encouraging patient to return CSW's call at her earliest convenience.  Follow Up Plan:  Additional outreach attempts will be made to offer the patient care coordination information and services.   Encounter Outcome:  No Answer.   Care Coordination Interventions Activated:  No.    Care Coordination Interventions:  No, not indicated.    Nat Christen, BSW, MSW, LCSW  Licensed Education officer, [facility]  Mailing [place] [address], [place], [place] [address] Physical Address[address], [place], [place] [address] Toll Free Main # [phone] Fax # [phone] Cell # [phone] [email or web address]

## 2021-12-18 DIAGNOSIS — M549 Dorsalgia, unspecified: Secondary | ICD-10-CM | POA: Diagnosis not present

## 2021-12-18 DIAGNOSIS — E785 Hyperlipidemia, unspecified: Secondary | ICD-10-CM | POA: Diagnosis not present

## 2021-12-18 DIAGNOSIS — K219 Gastro-esophageal reflux disease without esophagitis: Secondary | ICD-10-CM | POA: Diagnosis not present

## 2021-12-18 DIAGNOSIS — I10 Essential (primary) hypertension: Secondary | ICD-10-CM | POA: Diagnosis not present

## 2021-12-18 DIAGNOSIS — E119 Type 2 diabetes mellitus without complications: Secondary | ICD-10-CM | POA: Diagnosis not present

## 2021-12-18 DIAGNOSIS — R0789 Other chest pain: Secondary | ICD-10-CM | POA: Diagnosis not present

## 2021-12-18 DIAGNOSIS — Z9049 Acquired absence of other specified parts of digestive tract: Secondary | ICD-10-CM | POA: Diagnosis not present

## 2021-12-18 DIAGNOSIS — R1013 Epigastric pain: Secondary | ICD-10-CM | POA: Diagnosis not present

## 2021-12-18 DIAGNOSIS — R079 Chest pain, unspecified: Secondary | ICD-10-CM | POA: Diagnosis not present

## 2021-12-18 DIAGNOSIS — Z7722 Contact with and (suspected) exposure to environmental tobacco smoke (acute) (chronic): Secondary | ICD-10-CM | POA: Diagnosis not present

## 2021-12-18 DIAGNOSIS — R1011 Right upper quadrant pain: Secondary | ICD-10-CM | POA: Diagnosis not present

## 2021-12-18 DIAGNOSIS — I444 Left anterior fascicular block: Secondary | ICD-10-CM | POA: Diagnosis not present

## 2021-12-18 DIAGNOSIS — Z79899 Other long term (current) drug therapy: Secondary | ICD-10-CM | POA: Diagnosis not present

## 2021-12-18 DIAGNOSIS — K449 Diaphragmatic hernia without obstruction or gangrene: Secondary | ICD-10-CM | POA: Diagnosis not present

## 2021-12-18 DIAGNOSIS — R072 Precordial pain: Secondary | ICD-10-CM | POA: Diagnosis not present

## 2021-12-18 DIAGNOSIS — I350 Nonrheumatic aortic (valve) stenosis: Secondary | ICD-10-CM | POA: Diagnosis not present

## 2021-12-18 DIAGNOSIS — E039 Hypothyroidism, unspecified: Secondary | ICD-10-CM | POA: Diagnosis not present

## 2021-12-18 DIAGNOSIS — I2510 Atherosclerotic heart disease of native coronary artery without angina pectoris: Secondary | ICD-10-CM | POA: Diagnosis not present

## 2021-12-19 DIAGNOSIS — I10 Essential (primary) hypertension: Secondary | ICD-10-CM | POA: Diagnosis not present

## 2021-12-19 DIAGNOSIS — I2510 Atherosclerotic heart disease of native coronary artery without angina pectoris: Secondary | ICD-10-CM | POA: Insufficient documentation

## 2021-12-19 DIAGNOSIS — R0609 Other forms of dyspnea: Secondary | ICD-10-CM | POA: Diagnosis not present

## 2021-12-19 DIAGNOSIS — R079 Chest pain, unspecified: Secondary | ICD-10-CM | POA: Diagnosis not present

## 2021-12-19 DIAGNOSIS — R011 Cardiac murmur, unspecified: Secondary | ICD-10-CM | POA: Diagnosis not present

## 2021-12-19 DIAGNOSIS — R7303 Prediabetes: Secondary | ICD-10-CM | POA: Insufficient documentation

## 2021-12-19 DIAGNOSIS — E119 Type 2 diabetes mellitus without complications: Secondary | ICD-10-CM | POA: Diagnosis not present

## 2021-12-22 ENCOUNTER — Other Ambulatory Visit: Payer: Self-pay | Admitting: *Deleted

## 2021-12-22 NOTE — Patient Outreach (Signed)
  Care Coordination   12/22/2021  Name: Yesenia [name] MRN: [id number] DOB: 12/03/1950   Care Coordination Outreach Attempts:  A third unsuccessful outreach was attempted today to offer the patient with information about available care coordination services as a benefit of their health plan. HIPAA compliant message left on voicemail, providing contact information for CSW, encouraging patient to return CSW's call at her earliest convenience.  Follow Up Plan:  No further outreach attempts will be made at this time. We have been unable to contact the patient to offer or enroll patient in care coordination services.  Encounter Outcome:  No Answer.   Care Coordination Interventions Activated:  No.    Care Coordination Interventions:  No, not indicated.    Nat Christen, BSW, MSW, LCSW  Licensed Education officer, [facility]  Mailing [place] [address] [place], [place] [address] Physical Address[address] [place], [place] [address] Toll Free Main # [phone] Fax # [phone] Cell # [phone] [name]

## 2022-02-16 ENCOUNTER — Other Ambulatory Visit: Payer: Self-pay | Admitting: Internal Medicine

## 2022-03-16 ENCOUNTER — Other Ambulatory Visit: Payer: Self-pay | Admitting: Gastroenterology

## 2022-04-19 ENCOUNTER — Other Ambulatory Visit: Payer: Self-pay

## 2022-04-19 ENCOUNTER — Encounter (HOSPITAL_COMMUNITY): Payer: Self-pay

## 2022-04-19 ENCOUNTER — Emergency Department (HOSPITAL_COMMUNITY): Payer: Medicare HMO

## 2022-04-19 ENCOUNTER — Inpatient Hospital Stay (HOSPITAL_COMMUNITY)
Admission: EM | Admit: 2022-04-19 | Discharge: 2022-04-26 | DRG: 398 | Disposition: A | Discharge status: Home Health | Payer: Medicare HMO | Attending: General Surgery | Admitting: General Surgery

## 2022-04-19 DIAGNOSIS — F419 Anxiety disorder, unspecified: Secondary | ICD-10-CM | POA: Diagnosis present

## 2022-04-19 DIAGNOSIS — G4730 Sleep apnea, unspecified: Secondary | ICD-10-CM | POA: Diagnosis not present

## 2022-04-19 DIAGNOSIS — K3533 Acute appendicitis with perforation and localized peritonitis, with abscess: Principal | ICD-10-CM

## 2022-04-19 DIAGNOSIS — Z6841 Body Mass Index (BMI) 40.0 and over, adult: Secondary | ICD-10-CM

## 2022-04-19 DIAGNOSIS — Z86711 Personal history of pulmonary embolism: Secondary | ICD-10-CM

## 2022-04-19 DIAGNOSIS — K6289 Other specified diseases of anus and rectum: Secondary | ICD-10-CM | POA: Diagnosis not present

## 2022-04-19 DIAGNOSIS — E7800 Pure hypercholesterolemia, unspecified: Secondary | ICD-10-CM | POA: Diagnosis present

## 2022-04-19 DIAGNOSIS — Z1152 Encounter for screening for COVID-19: Secondary | ICD-10-CM | POA: Diagnosis not present

## 2022-04-19 DIAGNOSIS — F32A Depression, unspecified: Secondary | ICD-10-CM | POA: Diagnosis not present

## 2022-04-19 DIAGNOSIS — M810 Age-related osteoporosis without current pathological fracture: Secondary | ICD-10-CM | POA: Diagnosis present

## 2022-04-19 DIAGNOSIS — Z886 Allergy status to analgesic agent status: Secondary | ICD-10-CM | POA: Diagnosis not present

## 2022-04-19 DIAGNOSIS — Z79890 Hormone replacement therapy (postmenopausal): Secondary | ICD-10-CM

## 2022-04-19 DIAGNOSIS — M797 Fibromyalgia: Secondary | ICD-10-CM | POA: Diagnosis present

## 2022-04-19 DIAGNOSIS — Z818 Family history of other mental and behavioral disorders: Secondary | ICD-10-CM

## 2022-04-19 DIAGNOSIS — Z9049 Acquired absence of other specified parts of digestive tract: Secondary | ICD-10-CM | POA: Diagnosis not present

## 2022-04-19 DIAGNOSIS — K381 Appendicular concretions: Secondary | ICD-10-CM | POA: Diagnosis present

## 2022-04-19 DIAGNOSIS — Z800 Family history of malignant neoplasm of digestive organs: Secondary | ICD-10-CM

## 2022-04-19 DIAGNOSIS — Z86718 Personal history of other venous thrombosis and embolism: Secondary | ICD-10-CM

## 2022-04-19 DIAGNOSIS — E1142 Type 2 diabetes mellitus with diabetic polyneuropathy: Secondary | ICD-10-CM | POA: Diagnosis present

## 2022-04-19 DIAGNOSIS — H409 Unspecified glaucoma: Secondary | ICD-10-CM | POA: Diagnosis present

## 2022-04-19 DIAGNOSIS — E039 Hypothyroidism, unspecified: Secondary | ICD-10-CM | POA: Diagnosis present

## 2022-04-19 DIAGNOSIS — E46 Unspecified protein-calorie malnutrition: Secondary | ICD-10-CM | POA: Diagnosis not present

## 2022-04-19 DIAGNOSIS — K3532 Acute appendicitis with perforation and localized peritonitis, without abscess: Principal | ICD-10-CM | POA: Diagnosis present

## 2022-04-19 DIAGNOSIS — E876 Hypokalemia: Secondary | ICD-10-CM | POA: Diagnosis not present

## 2022-04-19 DIAGNOSIS — Z79899 Other long term (current) drug therapy: Secondary | ICD-10-CM

## 2022-04-19 DIAGNOSIS — E038 Other specified hypothyroidism: Secondary | ICD-10-CM | POA: Diagnosis not present

## 2022-04-19 DIAGNOSIS — Z825 Family history of asthma and other chronic lower respiratory diseases: Secondary | ICD-10-CM

## 2022-04-19 DIAGNOSIS — Z7983 Long term (current) use of bisphosphonates: Secondary | ICD-10-CM

## 2022-04-19 DIAGNOSIS — K7581 Nonalcoholic steatohepatitis (NASH): Secondary | ICD-10-CM | POA: Diagnosis present

## 2022-04-19 DIAGNOSIS — I10 Essential (primary) hypertension: Secondary | ICD-10-CM | POA: Diagnosis not present

## 2022-04-19 DIAGNOSIS — A419 Sepsis, unspecified organism: Secondary | ICD-10-CM | POA: Insufficient documentation

## 2022-04-19 DIAGNOSIS — Z888 Allergy status to other drugs, medicaments and biological substances status: Secondary | ICD-10-CM | POA: Diagnosis not present

## 2022-04-19 DIAGNOSIS — R109 Unspecified abdominal pain: Secondary | ICD-10-CM | POA: Diagnosis present

## 2022-04-19 DIAGNOSIS — N281 Cyst of kidney, acquired: Secondary | ICD-10-CM | POA: Diagnosis not present

## 2022-04-19 DIAGNOSIS — Z821 Family history of blindness and visual loss: Secondary | ICD-10-CM

## 2022-04-19 DIAGNOSIS — Z90710 Acquired absence of both cervix and uterus: Secondary | ICD-10-CM

## 2022-04-19 DIAGNOSIS — Z8249 Family history of ischemic heart disease and other diseases of the circulatory system: Secondary | ICD-10-CM

## 2022-04-19 DIAGNOSIS — Z8262 Family history of osteoporosis: Secondary | ICD-10-CM

## 2022-04-19 DIAGNOSIS — E1149 Type 2 diabetes mellitus with other diabetic neurological complication: Secondary | ICD-10-CM | POA: Diagnosis not present

## 2022-04-19 DIAGNOSIS — Z801 Family history of malignant neoplasm of trachea, bronchus and lung: Secondary | ICD-10-CM

## 2022-04-19 DIAGNOSIS — Z87442 Personal history of urinary calculi: Secondary | ICD-10-CM

## 2022-04-19 DIAGNOSIS — D638 Anemia in other chronic diseases classified elsewhere: Secondary | ICD-10-CM | POA: Diagnosis present

## 2022-04-19 DIAGNOSIS — E8809 Other disorders of plasma-protein metabolism, not elsewhere classified: Secondary | ICD-10-CM | POA: Diagnosis not present

## 2022-04-19 DIAGNOSIS — J189 Pneumonia, unspecified organism: Secondary | ICD-10-CM | POA: Diagnosis not present

## 2022-04-19 DIAGNOSIS — Z8711 Personal history of peptic ulcer disease: Secondary | ICD-10-CM

## 2022-04-19 DIAGNOSIS — R1084 Generalized abdominal pain: Secondary | ICD-10-CM | POA: Diagnosis not present

## 2022-04-19 LAB — URINALYSIS, ROUTINE W REFLEX MICROSCOPIC
Bilirubin Urine: NEGATIVE
Glucose, UA: NEGATIVE mg/dL
Hgb urine dipstick: NEGATIVE
Ketones, ur: 5 mg/dL — AB
Leukocytes,Ua: NEGATIVE
Nitrite: NEGATIVE
Protein, ur: 30 mg/dL — AB
Specific Gravity, Urine: >1.046 — ABNORMAL HIGH (ref 1.005–1.030)
pH: 6 (ref 5.0–8.0)

## 2022-04-19 LAB — RESP PANEL BY RT-PCR (RSV, FLU A&B, COVID)  RVPGX2
Influenza A by PCR: NEGATIVE
Influenza B by PCR: NEGATIVE
Resp Syncytial Virus by PCR: NEGATIVE
SARS Coronavirus 2 by RT PCR: NEGATIVE

## 2022-04-19 LAB — COMPREHENSIVE METABOLIC PANEL
ALT: 10 U/L (ref 0–44)
AST: 16 U/L (ref 15–41)
Albumin: 3.3 g/dL — ABNORMAL LOW (ref 3.5–5.0)
Alkaline Phosphatase: 62 U/L (ref 38–126)
Anion gap: 12 (ref 5–15)
BUN: 23 mg/dL (ref 8–23)
CO2: 26 mmol/L (ref 22–32)
Calcium: 9.3 mg/dL (ref 8.9–10.3)
Chloride: 97 mmol/L — ABNORMAL LOW (ref 98–111)
Creatinine, Ser: 1.16 mg/dL — ABNORMAL HIGH (ref 0.44–1.00)
GFR, Estimated: 50 mL/min — ABNORMAL LOW (ref >60)
Glucose, Bld: 135 mg/dL — ABNORMAL HIGH (ref 70–99)
Potassium: 2.7 mmol/L — CL (ref 3.5–5.1)
Sodium: 135 mmol/L (ref 135–145)
Total Bilirubin: 1 mg/dL (ref 0.3–1.2)
Total Protein: 7.4 g/dL (ref 6.5–8.1)

## 2022-04-19 LAB — CBC
HCT: 37.5 % (ref 36.0–46.0)
Hemoglobin: 11.1 g/dL — ABNORMAL LOW (ref 12.0–15.0)
MCH: 24.7 pg — ABNORMAL LOW (ref 26.0–34.0)
MCHC: 29.6 g/dL — ABNORMAL LOW (ref 30.0–36.0)
MCV: 83.3 fL (ref 80.0–100.0)
Platelets: 392 10*3/uL (ref 150–400)
RBC: 4.5 MIL/uL (ref 3.87–5.11)
RDW: 16.8 % — ABNORMAL HIGH (ref 11.5–15.5)
WBC: 15.4 10*3/uL — ABNORMAL HIGH (ref 4.0–10.5)
nRBC: 0 % (ref 0.0–0.2)

## 2022-04-19 LAB — CBG MONITORING, ED: Glucose-Capillary: 133 mg/dL — ABNORMAL HIGH (ref 70–99)

## 2022-04-19 LAB — LIPASE, BLOOD: Lipase: 35 U/L (ref 11–51)

## 2022-04-19 MED ORDER — POTASSIUM CHLORIDE IN NACL 40-0.9 MEQ/L-% IV SOLN
INTRAVENOUS | Status: DC
  Filled 2022-04-19 (×2): qty 1000

## 2022-04-19 MED ORDER — MAGNESIUM SULFATE 2 GM/50ML IV SOLN
2.0000 g | INTRAVENOUS | Status: AC
  Administered 2022-04-19: 2 g via INTRAVENOUS
  Filled 2022-04-19: qty 50

## 2022-04-19 MED ORDER — ACETAMINOPHEN 650 MG RE SUPP: 650.0000 mg | Freq: Four times a day (QID) | RECTAL | Status: DC | PRN

## 2022-04-19 MED ORDER — HEPARIN SODIUM (PORCINE) 5000 UNIT/ML IJ SOLN
5000.0000 [IU] | Freq: Three times a day (TID) | INTRAMUSCULAR | Status: DC
  Administered 2022-04-19 – 2022-04-26 (×19): 5000 [IU] via SUBCUTANEOUS
  Filled 2022-04-19 (×19): qty 1

## 2022-04-19 MED ORDER — AMITRIPTYLINE HCL 10 MG PO TABS
10.0000 mg | ORAL_TABLET | Freq: Every day | ORAL | Status: DC
  Administered 2022-04-20 – 2022-04-25 (×6): 10 mg via ORAL
  Filled 2022-04-19 (×7): qty 1

## 2022-04-19 MED ORDER — IOHEXOL 300 MG/ML  SOLN
100.0000 mL | Freq: Once | INTRAMUSCULAR | Status: AC | PRN
  Administered 2022-04-19: 100 mL via INTRAVENOUS

## 2022-04-19 MED ORDER — LEVOTHYROXINE SODIUM 75 MCG PO TABS
150.0000 ug | ORAL_TABLET | Freq: Every day | ORAL | Status: DC
  Administered 2022-04-20 – 2022-04-26 (×7): 150 ug via ORAL
  Filled 2022-04-19 (×7): qty 2

## 2022-04-19 MED ORDER — MORPHINE SULFATE (PF) 2 MG/ML IV SOLN
2.0000 mg | INTRAVENOUS | Status: DC | PRN
  Administered 2022-04-19 – 2022-04-22 (×12): 2 mg via INTRAVENOUS
  Filled 2022-04-19 (×13): qty 1

## 2022-04-19 MED ORDER — ACETAMINOPHEN 325 MG PO TABS
650.0000 mg | ORAL_TABLET | Freq: Four times a day (QID) | ORAL | Status: DC | PRN
  Administered 2022-04-23 – 2022-04-25 (×3): 650 mg via ORAL
  Filled 2022-04-19 (×3): qty 2

## 2022-04-19 MED ORDER — INSULIN ASPART 100 UNIT/ML IJ SOLN: 0.0000 [IU] | Freq: Every day | INTRAMUSCULAR | Status: DC

## 2022-04-19 MED ORDER — PIPERACILLIN-TAZOBACTAM 3.375 G IVPB
3.3750 g | Freq: Three times a day (TID) | INTRAVENOUS | Status: DC
  Administered 2022-04-20 – 2022-04-22 (×7): 3.375 g via INTRAVENOUS
  Filled 2022-04-19 (×7): qty 50

## 2022-04-19 MED ORDER — AMLODIPINE BESYLATE 5 MG PO TABS
2.5000 mg | ORAL_TABLET | Freq: Every day | ORAL | Status: DC
  Administered 2022-04-19 – 2022-04-26 (×7): 2.5 mg via ORAL
  Filled 2022-04-19 (×8): qty 1

## 2022-04-19 MED ORDER — POTASSIUM CHLORIDE 10 MEQ/100ML IV SOLN
10.0000 meq | INTRAVENOUS | Status: AC
  Administered 2022-04-19 – 2022-04-20 (×3): 10 meq via INTRAVENOUS
  Filled 2022-04-19 (×3): qty 100

## 2022-04-19 MED ORDER — ONDANSETRON HCL 4 MG PO TABS
4.0000 mg | ORAL_TABLET | Freq: Four times a day (QID) | ORAL | Status: DC | PRN
  Administered 2022-04-23 (×2): 4 mg via ORAL
  Filled 2022-04-19 (×2): qty 1

## 2022-04-19 MED ORDER — DULOXETINE HCL 30 MG PO CPEP
30.0000 mg | ORAL_CAPSULE | Freq: Every evening | ORAL | Status: DC
  Administered 2022-04-19 – 2022-04-25 (×7): 30 mg via ORAL
  Filled 2022-04-19 (×8): qty 1

## 2022-04-19 MED ORDER — LOSARTAN POTASSIUM 50 MG PO TABS
25.0000 mg | ORAL_TABLET | Freq: Every day | ORAL | Status: DC
  Administered 2022-04-19 – 2022-04-26 (×7): 25 mg via ORAL
  Filled 2022-04-19 (×8): qty 1

## 2022-04-19 MED ORDER — METOPROLOL SUCCINATE ER 50 MG PO TB24
50.0000 mg | ORAL_TABLET | Freq: Every day | ORAL | Status: DC
  Administered 2022-04-19 – 2022-04-26 (×7): 50 mg via ORAL
  Filled 2022-04-19 (×7): qty 1

## 2022-04-19 MED ORDER — ONDANSETRON HCL 4 MG/2ML IJ SOLN
4.0000 mg | Freq: Four times a day (QID) | INTRAMUSCULAR | Status: DC | PRN
  Administered 2022-04-19 – 2022-04-21 (×3): 4 mg via INTRAVENOUS
  Filled 2022-04-19 (×4): qty 2

## 2022-04-19 MED ORDER — INSULIN ASPART 100 UNIT/ML IJ SOLN
0.0000 [IU] | Freq: Three times a day (TID) | INTRAMUSCULAR | Status: DC
  Administered 2022-04-20 – 2022-04-22 (×3): 2 [IU] via SUBCUTANEOUS

## 2022-04-19 MED ORDER — METOPROLOL SUCCINATE ER 50 MG PO TB24
50.0000 mg | ORAL_TABLET | Freq: Every day | ORAL | Status: DC
  Filled 2022-04-19: qty 1

## 2022-04-19 MED ORDER — LEVOTHYROXINE SODIUM 50 MCG PO TABS: 150.0000 ug | ORAL_TABLET | Freq: Every day | ORAL | Status: DC

## 2022-04-19 MED ORDER — PIPERACILLIN-TAZOBACTAM 3.375 G IVPB 30 MIN
3.3750 g | Freq: Once | INTRAVENOUS | Status: AC
  Administered 2022-04-19: 3.375 g via INTRAVENOUS
  Filled 2022-04-19: qty 50

## 2022-04-19 MED ORDER — OXYCODONE HCL 5 MG PO TABS
5.0000 mg | ORAL_TABLET | ORAL | Status: DC | PRN
  Administered 2022-04-21 – 2022-04-26 (×20): 5 mg via ORAL
  Filled 2022-04-19 (×20): qty 1

## 2022-04-19 MED ORDER — PIPERACILLIN-TAZOBACTAM 3.375 G IVPB 30 MIN: 3.3750 g | Freq: Three times a day (TID) | INTRAVENOUS | Status: DC

## 2022-04-19 MED ORDER — POTASSIUM CHLORIDE 10 MEQ/100ML IV SOLN
10.0000 meq | Freq: Once | INTRAVENOUS | Status: AC
  Administered 2022-04-19: 10 meq via INTRAVENOUS
  Filled 2022-04-19: qty 100

## 2022-04-19 NOTE — Assessment & Plan Note (Signed)
-  CT shows appendicitis with possible abscess and reactive enteritis - Started on Zosyn, continue - Dr. Arnoldo Morale plans to see patient in the a.m. for possible surgery - Associated leukocytosis 15.4, trend in the a.m. - Pain control with pain scale - Continue to monitor

## 2022-04-19 NOTE — Assessment & Plan Note (Signed)
-  Continue Synthroid °

## 2022-04-19 NOTE — ED Notes (Signed)
Patient transported to CT 

## 2022-04-19 NOTE — ED Provider Notes (Signed)
[facility] EMERGENCY DEPARTMENT Provider Note   CSN: [id number] Arrival date & time: 04/19/22  1306     History  Chief Complaint  Patient presents with   Abdominal Pain    Yesenia [name] is a 72 y.o. female.   Abdominal Pain  72 year old female, history of prior laparoscopic cholecystectomy as well as a tubal ligation, she has a history of diabetes as well as a history of hypothyroidism and hypercholesterolemia.  She presents to the hospital stating that approximately 4 days ago she developed acute onset of nausea and vomiting with associated watery diarrhea, this has been present for 4 days and gradually getting worse, today is the worst of it.  She is not having any blood in her vomit or stool.  She does endorse having increasing abdominal discomfort and distention, no chest pain no coughing no shortness of breath no headache.  Not able to take any fluids or medications.  States she is following her blood sugars and they have been okay    Home Medications Prior to Admission medications   Medication Sig Start Date End Date Taking? Authorizing Provider  alendronate (FOSAMAX) 70 MG tablet TAKE 1 TABLET BY MOUTH ONCE WEEKLY. TAKE WITH FULL GLASS OF WATER ON AN EMPTY STOMACH. Patient taking differently: Take 70 mg by mouth once a week. TAKE 1 TABLET BY MOUTH ONCE WEEKLY. TAKE WITH FULL GLASS OF WATER ON AN EMPTY STOMACH. 12/13/19   [name], MD  Blood Glucose Monitoring Suppl (BLOOD GLUCOSE SYSTEM PAK) KIT Please dispense as Accu Chek Guide. USE TO TEST BLOOD SUGAR 3 TIMES DAILY. Dx: E11.65. 01/23/20   [name], MD  calcium carbonate (TUMS) 500 MG chewable tablet Chew 1 tablet (200 mg of elemental calcium total) by mouth 2 (two) times daily. 08/09/20   Roxan Hockey, MD  cholecalciferol (VITAMIN D) 25 MCG (1000 UNIT) tablet TAKE (1) TABLET BY MOUTH ONCE DAILY. Patient taking differently: Take 1,000 Units by mouth daily. 06/24/20   [name], MD   dexlansoprazole (DEXILANT) 60 MG capsule TAKE 1 CAPSULE DAILY. 03/18/22   Rourk, Cristopher Estimable, MD  DULoxetine (CYMBALTA) 30 MG capsule Take 30 mg by mouth every evening. Q PM  Take in addition to '60mg'$  Q AM    [provider]  DULoxetine (CYMBALTA) 60 MG capsule TAKE 1 CAPSULE BY MOUTH EVERY MORNING. Patient taking differently: Take 60 mg by mouth every morning. 05/11/19   [name], MD  famotidine (PEPCID) 20 MG tablet TAKE 1 TABLET TWICE DAILY AS NEEDED FOR HEARTBURN/INDIGESTION. Patient taking differently: Take 20 mg by mouth 2 (two) times daily as needed for heartburn or indigestion. 05/30/20   [name], MD  furosemide (LASIX) 40 MG tablet TAKE (1) TABLET BY MOUTH ONCE DAILY AS NEEDED. Patient taking differently: Take 40 mg by mouth daily as needed for edema. 06/24/20   [name], MD  Glucose Blood (BLOOD GLUCOSE TEST STRIPS) STRP Please dispense as Accu Chek Guide. USE TO TEST BLOOD SUGAR 3 TIMES DAILY. Dx: E11.65 01/23/20   [name], MD  Lancet Devices MISC Please dispense based on patient and insurance preference. Monitor FSBS 3x daily. Dx: E11.65 09/13/17   [name], MD  Lancets MISC Please dispense as Accu Chek Guide. USE TO TEST BLOOD SUGAR 3 TIMES DAILY. Dx: E11.65. 01/23/20   [name], MD  levothyroxine (SYNTHROID) 150 MCG tablet TAKE (1) TABLET BY MOUTH ONCE DAILY BEFORE BREAKFAST. Patient taking differently: Take 150 mcg  by mouth daily before breakfast. 04/03/20   [name], MD  linaclotide [facility]) 290 MCG CAPS capsule TAKE 1 CAPSULE ONCE DAILY BEFORE BREAKFAST 02/18/22   [name], NP  mupirocin cream (BACTROBAN) 2 % Apply 1 application topically 2 (two) times daily. To face and ear 05/22/20   [name], MD  polyethylene glycol-electrolytes (TRILYTE) 420 g solution Take 4,000 mLs by mouth as directed. 06/04/20   Rourk, Cristopher Estimable, MD  pregabalin (LYRICA) 150 MG capsule TAKE 1 CAPSULE BY MOUTH THREE TIMES  DAILY.**ALWAYS IN BOTTLE** Patient taking differently: Take 150 mg by mouth in the morning, at noon, and at bedtime. TAKE 1 CAPSULE BY MOUTH THREE TIMES DAILY.**ALWAYS IN BOTTLE** 04/04/20   Susy Frizzle, MD  simvastatin (ZOCOR) 10 MG tablet TAKE 1 TABLET BY MOUTH AT BEDTIME. Patient taking differently: Take 10 mg by mouth every evening. 05/30/20   [name], MD  traZODone (DESYREL) 100 MG tablet TAKE 1&1/2 TABLETS DAILY AT BEDTIME FOR SLEEP AND MOOD Patient taking differently: Take 150 mg by mouth at bedtime. 02/08/20   [name], MD      Allergies    Aspirin and Nalbuphine    Review of Systems   Review of Systems  Gastrointestinal:  Positive for abdominal pain.  All other systems reviewed and are negative.   Physical Exam Updated Vital Signs BP 129/72   Pulse 95   Temp 98.7 F (37.1 C) (Oral)   Resp (!) 22   Ht 1.575 m ('5\' 2"'$ )   Wt 111.1 kg   SpO2 94%   BMI 44.81 kg/m  Physical Exam Vitals and nursing note reviewed.  Constitutional:      General: She is not in acute distress.    Appearance: She is well-developed.  HENT:     Head: Normocephalic and atraumatic.     Mouth/Throat:     Pharynx: No oropharyngeal exudate.  Eyes:     General: No scleral icterus.       Right eye: No discharge.        Left eye: No discharge.     Conjunctiva/sclera: Conjunctivae normal.     Pupils: Pupils are equal, round, and reactive to light.  Neck:     Thyroid: No thyromegaly.     Vascular: No JVD.  Cardiovascular:     Rate and Rhythm: Regular rhythm. Tachycardia present.     Heart sounds: Normal heart sounds. No murmur heard.    No friction rub. No gallop.  Pulmonary:     Effort: Pulmonary effort is normal. No respiratory distress.     Breath sounds: Normal breath sounds. No wheezing or rales.  Abdominal:     General: Bowel sounds are normal. There is distension.     Palpations: Abdomen is soft. There is no mass.     Tenderness: There is abdominal tenderness.      Comments: Diffuse tympanitic sounds to percussion, soft but moderately tender abdomen without guarding or focality  Musculoskeletal:        General: No tenderness. Normal range of motion.     Cervical back: Normal range of motion and neck supple.     Right lower leg: No edema.     Left lower leg: No edema.  Lymphadenopathy:     Cervical: No cervical adenopathy.  Skin:    General: Skin is warm and dry.     Findings: No erythema or rash.  Neurological:     Mental Status: She is alert.  Coordination: Coordination normal.  Psychiatric:        Behavior: Behavior normal.     ED Results / Procedures / Treatments   Labs (all labs ordered are listed, but only abnormal results are displayed) Labs Reviewed  COMPREHENSIVE METABOLIC PANEL - Abnormal; Notable for the following components:      Result Value   Potassium 2.7 (*)    Chloride 97 (*)    Glucose, Bld 135 (*)    Creatinine, Ser 1.16 (*)    Albumin 3.3 (*)    GFR, Estimated 50 (*)    All other components within normal limits  CBC - Abnormal; Notable for the following components:   WBC 15.4 (*)    Hemoglobin 11.1 (*)    MCH 24.7 (*)    MCHC 29.6 (*)    RDW 16.8 (*)    All other components within normal limits  RESP PANEL BY RT-PCR (RSV, FLU A&B, COVID)  [id number]  LIPASE, BLOOD  URINALYSIS, ROUTINE W REFLEX MICROSCOPIC    EKG None  Radiology CT ABDOMEN PELVIS W CONTRAST  Result Date: 04/19/2022 CLINICAL DATA:  Right lower quadrant abdominal pain, nausea, vomiting and diarrhea for several days with body aches. EXAM: CT ABDOMEN AND PELVIS WITH CONTRAST TECHNIQUE: Multidetector CT imaging of the abdomen and pelvis was performed using the standard protocol following bolus administration of intravenous contrast. RADIATION DOSE REDUCTION: This exam was performed according to the departmental dose-optimization program which includes automated exposure control, adjustment of the mA and/or kV according to patient size and/or use  of iterative reconstruction technique. CONTRAST:  115m OMNIPAQUE IOHEXOL 300 MG/ML  SOLN COMPARISON:  11/17/2011 CT abdomen/pelvis. 10/23/2018 CT angiogram of the chest, abdomen and pelvis. FINDINGS: Lower chest: No significant pulmonary nodules or acute consolidative airspace disease. Hepatobiliary: Normal liver size. No liver mass. Cholecystectomy. No biliary ductal dilatation. Pancreas: Normal, with no mass or duct dilation. Spleen: Normal size. No mass. Adrenals/Urinary Tract: Right adrenal macroscopic fat density 4.0 cm mass compatible with myelolipoma, unchanged. No left adrenal nodules. No hydronephrosis. Simple 2.3 cm upper right renal cyst and scattered subcentimeter hypodense left renal cortical lesions that are too small to characterize, for which no follow-up imaging is recommended. Normal bladder. Stomach/Bowel: Moderate hiatal hernia. Otherwise normal nondistended stomach. Abnormal dilated (14 mm diameter) appendix with diffuse appendiceal wall thickening and hyperenhancement with periappendiceal fat stranding, compatible with acute appendicitis. There is a small 2.8 x 1.8 x 1.8 cm fluid collection abutting the terminal ileum, cecum and appendix in the right lower quadrant with internal 0.6 cm calcified stone suspicious for abscess (series 8/image 54). Mild wall thickening throughout the terminal ileum. Several mildly to moderately dilated small bowel loops throughout the abdomen measuring up to 4.4 cm diameter with air-fluid levels. Marked sigmoid diverticulosis without definite sigmoid wall thickening. Vascular/Lymphatic: Atherosclerotic nonaneurysmal abdominal aorta. Patent portal, splenic, hepatic and renal veins. No pathologically enlarged lymph nodes in the abdomen or pelvis. Reproductive: Status post hysterectomy, with no abnormal findings at the vaginal cuff. No adnexal mass. Other: Trace free fluid in the pelvis.  No free intraperitoneal air. Musculoskeletal: No aggressive appearing focal  osseous lesions. Moderate thoracolumbar spondylosis. Bilateral posterior spinal fusion hardware at L4-5. IMPRESSION: 1. Dilated thick-walled appendix with periappendiceal fat stranding compatible with acute appendicitis. Small 2.8 x 1.8 x 1.8 cm fluid collection abutting the terminal ileum, cecum and appendix in the right lower quadrant with internal 0.6 cm calcified stone suspicious for abscess. No free intraperitoneal air. 2. Mild wall thickening throughout the  terminal ileum, most suggestive of reactive enteritis. 3. Several mildly to moderately dilated small bowel loops throughout the abdomen with air-fluid levels, favor reactive ileus. 4. Marked sigmoid diverticulosis. 5. Moderate hiatal hernia. 6. Stable right adrenal myelolipoma, for which no follow-up imaging is recommended. 7.  Aortic Atherosclerosis (ICD10-I70.0). Electronically Signed   By: Ilona Sorrel M.D.   On: 04/19/2022 18:02    Procedures .Critical Care  Performed by: Noemi Chapel, MD Authorized by: Noemi Chapel, MD   Critical care provider statement:    Critical care time (minutes):  45   Critical care time was exclusive of:  Separately billable procedures and treating other patients   Critical care was necessary to treat or prevent imminent or life-threatening deterioration of the following conditions:  Sepsis (Perforated appendicitis)   Critical care was time spent personally by me on the following activities:  Development of treatment plan with patient or surrogate, discussions with consultants, evaluation of patient's response to treatment, examination of patient, obtaining history from patient or surrogate, review of old charts, re-evaluation of patient's condition, pulse oximetry, ordering and review of radiographic studies, ordering and review of laboratory studies and ordering and performing treatments and interventions   I assumed direction of critical care for this patient from another provider in my specialty: no     Care  discussed with: admitting provider   Comments:           Medications Ordered in ED Medications  piperacillin-tazobactam (ZOSYN) IVPB 3.375 g (has no administration in time range)  magnesium sulfate IVPB 2 g 50 mL (2 g Intravenous New Bag/Given 04/19/22 1721)  potassium chloride 10 mEq in 100 mL IVPB (10 mEq Intravenous New Bag/Given 04/19/22 1718)  iohexol (OMNIPAQUE) 300 MG/ML solution 100 mL (100 mLs Intravenous Contrast Given 04/19/22 1724)    ED Course/ Medical Decision Making/ A&P                             Medical Decision Making Amount and/or Complexity of Data Reviewed Labs: ordered. Radiology: ordered.  Risk Prescription drug management. Decision regarding hospitalization.   This patient presents to the ED for concern of worsening abdominal pain with associated distention and vomiting, this involves an extensive number of treatment options, and is a complaint that carries with it a high risk of complications and morbidity.  The differential diagnosis includes bowel obstruction, colitis, severe gastroenteritis   Co morbidities that complicate the patient evaluation  Severe obesity, diabetes, prior surgery   Additional history obtained:  Additional history obtained from electronic medical record External records from outside source obtained and reviewed including multiple prior ED evaluations for chest pain dating back to September, followed in the office for congestive heart failure, fibromyalgia, admitted to the hospital for colonoscopy approximately 9 months ago Reviewed colonoscopy report by Dr. Gala Romney, showed diverticulosis of the sigmoid and descending colon, colonic lipomas and a redundant colon, no other acute findings, no bleeding, no bleeding of internal hemorrhoids   Lab Tests:  I Ordered, and personally interpreted labs.  The pertinent results include: CBC with a leukocytosis of 15,000, hypokalemic to 2.7   Imaging Studies ordered:  I ordered imaging  studies including CT scan of the abdomen and pelvis I independently visualized and interpreted imaging which showed dilated inflamed appendix with a small amount of fluid collection consistent with a perforation and focal abscess I agree with the radiologist interpretation   Cardiac Monitoring: / EKG:  The patient  was maintained on a cardiac monitor.  I personally viewed and interpreted the cardiac monitored which showed an underlying rhythm of: Sinus tachycardia gradually improved with fluids   Consultations Obtained:  I requested consultation with the general surgeon Dr. Arnoldo Morale who will see the patient in the morning for likely surgery, once n.p.o., antibiotics, admit to hospitalist, will page hospitalist for admission  Problem List / ED Course / Critical interventions / Medication management  Patient has what appears to be perforated appendicitis, antibiotics ordered, IV fluids, n.p.o. I ordered medication including Zosyn, IV fluids for perforated appendicitis Reevaluation of the patient after these medicines showed that the patient critically ill but improved I have reviewed the patients home medicines and have made adjustments as needed   Social Determinants of Health:  Morbidly obese,   Test / Admission - Considered:  Admit to hospital         Final Clinical Impression(s) / ED Diagnoses Final diagnoses:  Perforated appendicitis  Hypokalemia    Rx / DC Orders ED Discharge Orders     None         Noemi Chapel, MD 04/19/22 [place]

## 2022-04-19 NOTE — ED Triage Notes (Signed)
Pt presents to ED with complaints of mid abdominal pain, nausea, vomiting and diarrhea since Thursday, body aches

## 2022-04-19 NOTE — Assessment & Plan Note (Signed)
-  Hold metformin - Sliding scale coverage - Monitor CBGs

## 2022-04-19 NOTE — Progress Notes (Signed)
Aware of patient.  Need hypokalemia corrected prior to surgery.  Intend to do robotic assisted laparoscopic appendectomy tomorrow.  Will see patient in am.  IV zosyn started.

## 2022-04-19 NOTE — Assessment & Plan Note (Signed)
-  Continue amlodipine, losartan, metoprolol
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
## 2022-04-20 ENCOUNTER — Inpatient Hospital Stay (HOSPITAL_COMMUNITY): Payer: Medicare HMO | Admitting: Certified Registered Nurse Anesthetist

## 2022-04-20 ENCOUNTER — Other Ambulatory Visit: Payer: Self-pay

## 2022-04-20 ENCOUNTER — Encounter (HOSPITAL_COMMUNITY): Payer: Self-pay | Admitting: Family Medicine

## 2022-04-20 ENCOUNTER — Encounter (HOSPITAL_COMMUNITY)
Admission: EM | Disposition: A | Discharge status: Home Health | Payer: Self-pay | Source: Home / Self Care | Attending: General Surgery

## 2022-04-20 DIAGNOSIS — J189 Pneumonia, unspecified organism: Secondary | ICD-10-CM | POA: Diagnosis not present

## 2022-04-20 DIAGNOSIS — G4730 Sleep apnea, unspecified: Secondary | ICD-10-CM

## 2022-04-20 DIAGNOSIS — K3533 Acute appendicitis with perforation and localized peritonitis, with abscess: Secondary | ICD-10-CM | POA: Diagnosis not present

## 2022-04-20 DIAGNOSIS — I10 Essential (primary) hypertension: Secondary | ICD-10-CM | POA: Diagnosis not present

## 2022-04-20 DIAGNOSIS — K3532 Acute appendicitis with perforation and localized peritonitis, without abscess: Secondary | ICD-10-CM | POA: Diagnosis not present

## 2022-04-20 DIAGNOSIS — A419 Sepsis, unspecified organism: Secondary | ICD-10-CM | POA: Insufficient documentation

## 2022-04-20 HISTORY — PX: XI ROBOTIC LAPAROSCOPIC ASSISTED APPENDECTOMY: SHX6877

## 2022-04-20 LAB — CBC WITH DIFFERENTIAL/PLATELET
Abs Immature Granulocytes: 0.06 10*3/uL (ref 0.00–0.07)
Basophils Absolute: 0 10*3/uL (ref 0.0–0.1)
Basophils Relative: 0 %
Eosinophils Absolute: 0.2 10*3/uL (ref 0.0–0.5)
Eosinophils Relative: 2 %
HCT: 32.1 % — ABNORMAL LOW (ref 36.0–46.0)
Hemoglobin: 9.2 g/dL — ABNORMAL LOW (ref 12.0–15.0)
Immature Granulocytes: 1 %
Lymphocytes Relative: 11 %
Lymphs Abs: 1.2 10*3/uL (ref 0.7–4.0)
MCH: 24.3 pg — ABNORMAL LOW (ref 26.0–34.0)
MCHC: 28.7 g/dL — ABNORMAL LOW (ref 30.0–36.0)
MCV: 84.9 fL (ref 80.0–100.0)
Monocytes Absolute: 1.1 10*3/uL — ABNORMAL HIGH (ref 0.1–1.0)
Monocytes Relative: 10 %
Neutro Abs: 8.5 10*3/uL — ABNORMAL HIGH (ref 1.7–7.7)
Neutrophils Relative %: 76 %
Platelets: 318 10*3/uL (ref 150–400)
RBC: 3.78 MIL/uL — ABNORMAL LOW (ref 3.87–5.11)
RDW: 16.7 % — ABNORMAL HIGH (ref 11.5–15.5)
WBC: 11.1 10*3/uL — ABNORMAL HIGH (ref 4.0–10.5)
nRBC: 0 % (ref 0.0–0.2)

## 2022-04-20 LAB — HEMOGLOBIN A1C
Hgb A1c MFr Bld: 6.3 % — ABNORMAL HIGH (ref 4.8–5.6)
Mean Plasma Glucose: 134.11 mg/dL

## 2022-04-20 LAB — COMPREHENSIVE METABOLIC PANEL
ALT: 8 U/L (ref 0–44)
AST: 14 U/L — ABNORMAL LOW (ref 15–41)
Albumin: 2.8 g/dL — ABNORMAL LOW (ref 3.5–5.0)
Alkaline Phosphatase: 51 U/L (ref 38–126)
Anion gap: 11 (ref 5–15)
BUN: 21 mg/dL (ref 8–23)
CO2: 25 mmol/L (ref 22–32)
Calcium: 8.5 mg/dL — ABNORMAL LOW (ref 8.9–10.3)
Chloride: 99 mmol/L (ref 98–111)
Creatinine, Ser: 1 mg/dL (ref 0.44–1.00)
GFR, Estimated: >60 mL/min (ref >60)
Glucose, Bld: 113 mg/dL — ABNORMAL HIGH (ref 70–99)
Potassium: 2.9 mmol/L — ABNORMAL LOW (ref 3.5–5.1)
Sodium: 135 mmol/L (ref 135–145)
Total Bilirubin: 0.8 mg/dL (ref 0.3–1.2)
Total Protein: 6 g/dL — ABNORMAL LOW (ref 6.5–8.1)

## 2022-04-20 LAB — BASIC METABOLIC PANEL
Anion gap: 10 (ref 5–15)
BUN: 18 mg/dL (ref 8–23)
CO2: 22 mmol/L (ref 22–32)
Calcium: 8.2 mg/dL — ABNORMAL LOW (ref 8.9–10.3)
Chloride: 102 mmol/L (ref 98–111)
Creatinine, Ser: 1.05 mg/dL — ABNORMAL HIGH (ref 0.44–1.00)
GFR, Estimated: 57 mL/min — ABNORMAL LOW (ref >60)
Glucose, Bld: 118 mg/dL — ABNORMAL HIGH (ref 70–99)
Potassium: 3.9 mmol/L (ref 3.5–5.1)
Sodium: 134 mmol/L — ABNORMAL LOW (ref 135–145)

## 2022-04-20 LAB — GLUCOSE, CAPILLARY
Glucose-Capillary: 122 mg/dL — ABNORMAL HIGH (ref 70–99)
Glucose-Capillary: 85 mg/dL (ref 70–99)
Glucose-Capillary: 98 mg/dL (ref 70–99)
Glucose-Capillary: 110 mg/dL — ABNORMAL HIGH (ref 70–99)
Glucose-Capillary: 119 mg/dL — ABNORMAL HIGH (ref 70–99)

## 2022-04-20 LAB — SURGICAL PCR SCREEN
MRSA, PCR: NEGATIVE
Staphylococcus aureus: POSITIVE — AB

## 2022-04-20 LAB — MAGNESIUM: Magnesium: 2.4 mg/dL (ref 1.7–2.4)

## 2022-04-20 SURGERY — APPENDECTOMY, ROBOT-ASSISTED, LAPAROSCOPIC
Anesthesia: General | Site: Abdomen

## 2022-04-20 MED ORDER — ROCURONIUM BROMIDE 100 MG/10ML IV SOLN
INTRAVENOUS | Status: DC | PRN
  Administered 2022-04-20: 40 mg via INTRAVENOUS

## 2022-04-20 MED ORDER — SIMETHICONE 80 MG PO CHEW: 40.0000 mg | CHEWABLE_TABLET | Freq: Four times a day (QID) | ORAL | Status: DC | PRN

## 2022-04-20 MED ORDER — FENTANYL CITRATE (PF) 250 MCG/5ML IJ SOLN
INTRAMUSCULAR | Status: AC
  Filled 2022-04-20: qty 5

## 2022-04-20 MED ORDER — ONDANSETRON HCL 4 MG/2ML IJ SOLN: 4.0000 mg | Freq: Once | INTRAMUSCULAR | Status: DC | PRN

## 2022-04-20 MED ORDER — FENTANYL CITRATE (PF) 100 MCG/2ML IJ SOLN
INTRAMUSCULAR | Status: DC | PRN
  Administered 2022-04-20: 100 ug via INTRAVENOUS
  Administered 2022-04-20 (×2): 50 ug via INTRAVENOUS

## 2022-04-20 MED ORDER — PROPOFOL 10 MG/ML IV BOLUS
INTRAVENOUS | Status: AC
  Filled 2022-04-20: qty 20

## 2022-04-20 MED ORDER — ONDANSETRON HCL 4 MG/2ML IJ SOLN
INTRAMUSCULAR | Status: DC | PRN
  Administered 2022-04-20: 4 mg via INTRAVENOUS

## 2022-04-20 MED ORDER — PHENYLEPHRINE 80 MCG/ML (10ML) SYRINGE FOR IV PUSH (FOR BLOOD PRESSURE SUPPORT)
PREFILLED_SYRINGE | INTRAVENOUS | Status: AC
  Filled 2022-04-20: qty 10

## 2022-04-20 MED ORDER — LACTATED RINGERS IV SOLN: INTRAVENOUS | Status: DC | PRN

## 2022-04-20 MED ORDER — SUCCINYLCHOLINE CHLORIDE 200 MG/10ML IV SOSY
PREFILLED_SYRINGE | INTRAVENOUS | Status: AC
  Filled 2022-04-20: qty 10

## 2022-04-20 MED ORDER — EPHEDRINE SULFATE (PRESSORS) 50 MG/ML IJ SOLN
INTRAMUSCULAR | Status: DC | PRN
  Administered 2022-04-20 (×3): 5 mg via INTRAVENOUS

## 2022-04-20 MED ORDER — CHLORHEXIDINE GLUCONATE CLOTH 2 % EX PADS: 6.0000 | MEDICATED_PAD | Freq: Once | CUTANEOUS | Status: DC

## 2022-04-20 MED ORDER — PHENYLEPHRINE HCL (PRESSORS) 10 MG/ML IV SOLN
INTRAVENOUS | Status: DC | PRN
  Administered 2022-04-20: 80 ug via INTRAVENOUS
  Administered 2022-04-20 (×2): 100 ug via INTRAVENOUS

## 2022-04-20 MED ORDER — OXYCODONE HCL 5 MG/5ML PO SOLN: 5.0000 mg | Freq: Once | ORAL | Status: DC | PRN

## 2022-04-20 MED ORDER — LIDOCAINE HCL (CARDIAC) PF 50 MG/5ML IV SOSY
PREFILLED_SYRINGE | INTRAVENOUS | Status: DC | PRN
  Administered 2022-04-20: 60 mg via INTRAVENOUS

## 2022-04-20 MED ORDER — ONDANSETRON HCL 4 MG/2ML IJ SOLN
INTRAMUSCULAR | Status: AC
  Filled 2022-04-20: qty 2

## 2022-04-20 MED ORDER — SODIUM CHLORIDE 0.9 % IV SOLN: INTRAVENOUS | Status: DC

## 2022-04-20 MED ORDER — SUCCINYLCHOLINE CHLORIDE 200 MG/10ML IV SOSY
PREFILLED_SYRINGE | INTRAVENOUS | Status: DC | PRN
  Administered 2022-04-20: 120 mg via INTRAVENOUS

## 2022-04-20 MED ORDER — POTASSIUM CHLORIDE 10 MEQ/100ML IV SOLN
INTRAVENOUS | Status: AC
  Filled 2022-04-20: qty 100

## 2022-04-20 MED ORDER — OXYCODONE HCL 5 MG PO TABS: 5.0000 mg | ORAL_TABLET | Freq: Once | ORAL | Status: DC | PRN

## 2022-04-20 MED ORDER — PROPOFOL 10 MG/ML IV BOLUS
INTRAVENOUS | Status: DC | PRN
  Administered 2022-04-20: 130 mg via INTRAVENOUS

## 2022-04-20 MED ORDER — POTASSIUM CHLORIDE 10 MEQ/100ML IV SOLN
10.0000 meq | INTRAVENOUS | Status: AC
  Administered 2022-04-20 (×3): 10 meq via INTRAVENOUS
  Filled 2022-04-20 (×3): qty 100

## 2022-04-20 MED ORDER — STERILE WATER FOR IRRIGATION IR SOLN
Status: DC | PRN
  Administered 2022-04-20: 500 mL

## 2022-04-20 MED ORDER — SUGAMMADEX SODIUM 500 MG/5ML IV SOLN
INTRAVENOUS | Status: DC | PRN
  Administered 2022-04-20: 200 mg via INTRAVENOUS

## 2022-04-20 MED ORDER — MELATONIN 3 MG PO TABS
6.0000 mg | ORAL_TABLET | Freq: Once | ORAL | Status: AC
  Administered 2022-04-21: 6 mg via ORAL
  Filled 2022-04-20: qty 2

## 2022-04-20 MED ORDER — FENTANYL CITRATE PF 50 MCG/ML IJ SOSY: 25.0000 ug | PREFILLED_SYRINGE | INTRAMUSCULAR | Status: DC | PRN

## 2022-04-20 MED ORDER — BUPIVACAINE LIPOSOME 1.3 % IJ SUSP
INTRAMUSCULAR | Status: DC | PRN
  Administered 2022-04-20: 20 mL

## 2022-04-20 MED ORDER — EPHEDRINE 5 MG/ML INJ
INTRAVENOUS | Status: AC
  Filled 2022-04-20: qty 5

## 2022-04-20 MED ORDER — BUPIVACAINE LIPOSOME 1.3 % IJ SUSP
INTRAMUSCULAR | Status: AC
  Filled 2022-04-20: qty 20

## 2022-04-20 MED ORDER — ROCURONIUM BROMIDE 10 MG/ML (PF) SYRINGE
PREFILLED_SYRINGE | INTRAVENOUS | Status: AC
  Filled 2022-04-20: qty 10

## 2022-04-20 MED ORDER — LIDOCAINE HCL (PF) 2 % IJ SOLN
INTRAMUSCULAR | Status: AC
  Filled 2022-04-20: qty 5

## 2022-04-20 MED ORDER — SODIUM CHLORIDE 0.9 % IR SOLN
Status: DC | PRN
  Administered 2022-04-20: 3000 mL

## 2022-04-20 MED ORDER — POTASSIUM CHLORIDE 10 MEQ/100ML IV SOLN
10.0000 meq | INTRAVENOUS | Status: AC
  Administered 2022-04-20 (×3): 10 meq via INTRAVENOUS
  Filled 2022-04-20 (×6): qty 100

## 2022-04-20 SURGICAL SUPPLY — 58 items
ADH SKN CLS APL DERMABOND .7 (GAUZE/BANDAGES/DRESSINGS) ×1
APL PRP STRL LF DISP 70% ISPRP (MISCELLANEOUS) ×1
CANNULA REDUC XI 12-8 STAPL (CANNULA) ×1
CANNULA REDUCER 12-8 DVNC XI (CANNULA) ×1 IMPLANT
CHLORAPREP W/TINT 26 (MISCELLANEOUS) ×1 IMPLANT
COVER MAYO STAND XLG (MISCELLANEOUS) IMPLANT
COVER TIP SHEARS 8 DVNC (MISCELLANEOUS) ×1 IMPLANT
COVER TIP SHEARS 8MM DA VINCI (MISCELLANEOUS) ×1
DERMABOND ADVANCED .7 DNX12 (GAUZE/BANDAGES/DRESSINGS) ×1 IMPLANT
DRAPE ARM DVNC X/XI (DISPOSABLE) ×3 IMPLANT
DRAPE COLUMN DVNC XI (DISPOSABLE) ×1 IMPLANT
DRAPE DA VINCI XI ARM (DISPOSABLE) ×3
DRAPE DA VINCI XI COLUMN (DISPOSABLE) ×1
ELECT REM PT RETURN 9FT ADLT (ELECTROSURGICAL) ×1
ELECTRODE REM PT RTRN 9FT ADLT (ELECTROSURGICAL) ×1 IMPLANT
EVACUATOR DRAINAGE 10X20 100CC (DRAIN) IMPLANT
EVACUATOR SILICONE 100CC (DRAIN) ×1
GLOVE BIOGEL PI IND STRL 7.0 (GLOVE) ×2 IMPLANT
GLOVE ECLIPSE 6.5 STRL STRAW (GLOVE) IMPLANT
GLOVE SURG SS PI 7.5 STRL IVOR (GLOVE) ×2 IMPLANT
GOWN STRL REUS W/TWL LRG LVL3 (GOWN DISPOSABLE) ×3 IMPLANT
IRRIGATOR SUCT 8 DISP DVNC XI (IRRIGATION / IRRIGATOR) IMPLANT
IRRIGATOR SUCTION 8MM XI DISP (IRRIGATION / IRRIGATOR) ×1
KIT PINK PAD W/HEAD ARE REST (MISCELLANEOUS) ×1 IMPLANT
KIT PINK PAD W/HEAD ARM REST (MISCELLANEOUS) ×1 IMPLANT
KIT TURNOVER KIT A (KITS) ×1 IMPLANT
MANIFOLD NEPTUNE II (INSTRUMENTS) ×1 IMPLANT
NDL INSUFFLATION 14GA 120MM (NEEDLE) ×1 IMPLANT
NEEDLE HYPO 22GX1.5 SAFETY (NEEDLE) ×1 IMPLANT
NEEDLE INSUFFLATION 14GA 120MM (NEEDLE) ×1 IMPLANT
OBTURATOR OPTICAL STANDARD 8MM (TROCAR) ×1
OBTURATOR OPTICAL STND 8 DVNC (TROCAR) ×1
OBTURATOR OPTICALSTD 8 DVNC (TROCAR) ×1 IMPLANT
PACK LAP CHOLECYSTECTOMY (MISCELLANEOUS) ×1 IMPLANT
PAD ARMBOARD 7.5X6 YLW CONV (MISCELLANEOUS) ×1 IMPLANT
RELOAD STAPLE 45 3.5 BLU DVNC (STAPLE) IMPLANT
RELOAD STAPLER 3.5X45 BLU DVNC (STAPLE) ×1 IMPLANT
SEAL CANN UNIV 5-8 DVNC XI (MISCELLANEOUS) ×2 IMPLANT
SEAL XI 5MM-8MM UNIVERSAL (MISCELLANEOUS) ×2
SEALER VESSEL DA VINCI XI (MISCELLANEOUS) ×1
SEALER VESSEL EXT DVNC XI (MISCELLANEOUS) IMPLANT
SET TUBE SMOKE EVAC HIGH FLOW (TUBING) ×1 IMPLANT
SPONGE DRAIN TRACH 4X4 STRL 2S (GAUZE/BANDAGES/DRESSINGS) IMPLANT
STAPLER 45 DA VINCI SURE FORM (STAPLE) ×1
STAPLER 45 SUREFORM DVNC (STAPLE) ×1 IMPLANT
STAPLER CANNULA SEAL DVNC XI (STAPLE) ×1 IMPLANT
STAPLER CANNULA SEAL XI (STAPLE) ×1
STAPLER RELOAD 3.5X45 BLU DVNC (STAPLE) ×1
STAPLER RELOAD 3.5X45 BLUE (STAPLE) ×1
SUT ETHILON 3 0 FSL (SUTURE) IMPLANT
SUT MNCRL 4-0 (SUTURE) ×2
SUT MNCRL 4-0 27XMFL (SUTURE) ×2
SUTURE MNCRL 4-0 27XMF (SUTURE) ×1 IMPLANT
SYS RETRIEVAL 5MM INZII UNIV (BASKET) ×1
SYSTEM RETRIEVL 5MM INZII UNIV (BASKET) IMPLANT
TAPE TRANSPORE STRL 2 31045 (GAUZE/BANDAGES/DRESSINGS) ×1 IMPLANT
TRAY FOLEY MTR SLVR 16FR STAT (SET/KITS/TRAYS/PACK) ×1 IMPLANT
WATER STERILE IRR 500ML POUR (IV SOLUTION) ×1 IMPLANT

## 2022-04-20 NOTE — Op Note (Signed)
Patient:  Yesenia [name]  DOB:  10-27-1950  MRN:  [id number]   Preop Diagnosis: Perforated appendicitis  Postop Diagnosis: Same  Procedure: Robotic assisted laparoscopic appendectomy  Surgeon: Aviva Signs, MD  Anes: General endotracheal  Indications: Patient is a 72 year old white female who presents with right lower quadrant abdominal pain.  CT scan of the abdomen reveals acute appendicitis with probable appendicolith that has perforated through the appendix.  The risks and benefits of the procedure including bleeding, infection, and the possibility of an open procedure were fully explained to the patient, who gave informed consent.  Procedure note: The patient was placed in supine position.  After induction of general endotracheal anesthesia, the abdomen was prepped and draped using usual sterile technique with ChloraPrep.  Surgical site confirmation was performed.  The Veress needle was then introduced into the left upper quadrant without difficulty.  Confirmation of placement was done using the saline drop test.  The abdomen was then insufflated to 15 mmHg pressure.  An 8 mm trocar was initially introduced into the left mid flank region under direct visualization without difficulty.  An additional 12 mm trocar was placed in the left upper quadrant and an 8 mm trocar was placed in the left lower quadrant region.  The appendix was then targeted and the robot docked.  The patient had immediate evidence of a contained abscess.  I was able to find the appendicolith which was extraluminal.  The appendix was significantly dilated and inflamed.  The perforation occurred at the midportion of the appendix.  I was able to dissect the appendix up and divide the mesoappendix using the vessel sealer.  Once I was able to identify the juncture of the appendix to the cecum, a robotic 45cm robotic GIA stapler with a blue load was used.  The staple line was inspected and noted to be within normal limits.   The right lower quadrant was copiously irrigated normal saline.  The appendix along with the appendicolith were removed using an Endo Catch bag.  A #10 flat Jackson-Pratt drain was placed in the right paracolic gutter and brought out through the left lower quadrant 8 mm trocar site.  All fluid and air were then evacuated from the abdominal cavity prior to removal of the trocars.  The robot was undocked.  The 12 mm trocar fascia was reapproximated using an 0 Vicryl interrupted suture.  Exparel was instilled into all the wounds.  The skin was closed using 4-0 Monocryl subcuticular sutures.  The drain was secured in place using a 3-0 nylon interrupted suture.  Dermabond was applied.  All tape and needle counts were correct at the end of the procedure.  The patient was extubated in the operating room and transferred to PACU in stable condition.  Complications: None  EBL: Minimal  Specimen: Appendix  Drains: Jackson-Pratt drain to right paracolic gutter

## 2022-04-20 NOTE — Progress Notes (Signed)
Patient arrived from PACU at 1500 PACU nurse gave bedside report to this nurse, PACU nurse stated patient needs to be on oxygen, patient would desat when she pulled oxygen off. Her oxygen reading was 90 before arriving to floor. Patient arrived to floor on room air. Patient wanted to go to the bathroom this nurse ambulated patient to bathroom, patient voided. Patient is drowsy and confused at this time. Patient back to bed. Vital signs obtained B/P 125/69 pulse 82, oxygen was 76 on room air. This nurse placed oxygen on patient at 3 liters nasal canula, reading was 90%. MD made aware. Patient is now resting in bed with eyes closed, breathing is even and nonlabored. Bed in lowest position, call bell in reach, bed alarm on.

## 2022-04-20 NOTE — Interval H&P Note (Signed)
History and Physical Interval Note:  04/20/2022 11:52 AM  Yesenia [name]  has presented today for surgery, with the diagnosis of acute appendicitis with perforation.  The various methods of treatment have been discussed with the patient and family. After consideration of risks, benefits and other options for treatment, the patient has consented to  Procedure(s): XI ROBOTIC LAPAROSCOPIC ASSISTED APPENDECTOMY (N/A) as a surgical intervention.  The patient's history has been reviewed, patient examined, no change in status, stable for surgery.  I have reviewed the patient's chart and labs.  Questions were answered to the patient's satisfaction.     Aviva Signs
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
## 2022-04-20 NOTE — Progress Notes (Signed)
Patients nasal swab for surgery the Staphylococcus aureus came back positive. MD made aware. MD stated to continue the antibiotic she is already on. No new orders at this time.

## 2022-04-20 NOTE — Anesthesia Preprocedure Evaluation (Signed)
Anesthesia Evaluation  Patient identified by MRN, date of birth, ID band Patient awake    Reviewed: Allergy & Precautions, H&P , NPO status , Patient's Chart, lab work & pertinent test results, reviewed documented beta blocker date and time   History of Anesthesia Complications (+) history of anesthetic complications  Airway Mallampati: II  TM Distance: >3 FB Neck ROM: full    Dental no notable dental hx.    Pulmonary neg pulmonary ROS, sleep apnea , pneumonia   Pulmonary exam normal breath sounds clear to auscultation       Cardiovascular Exercise Tolerance: Good hypertension, negative cardio ROS + Valvular Problems/Murmurs  Rhythm:regular Rate:Normal     Neuro/Psych  PSYCHIATRIC DISORDERS Anxiety Depression     Neuromuscular disease negative neurological ROS  negative psych ROS   GI/Hepatic negative GI ROS, Neg liver ROS, hiatal hernia,GERD  ,,  Endo/Other  diabetesHypothyroidism  Morbid obesity  Renal/GU Renal diseasenegative Renal ROS  negative genitourinary   Musculoskeletal   Abdominal   Peds  Hematology negative hematology ROS (+)   Anesthesia Other Findings   Reproductive/Obstetrics negative OB ROS                             Anesthesia Physical Anesthesia Plan  ASA: 3 and emergent  Anesthesia Plan: General and General ETT   Post-op Pain Management:    Induction:   PONV Risk Score and Plan: Ondansetron  Airway Management Planned:   Additional Equipment:   Intra-op Plan:   Post-operative Plan:   Informed Consent: I have reviewed the patients History and Physical, chart, labs and discussed the procedure including the risks, benefits and alternatives for the proposed anesthesia with the patient or authorized representative who has indicated his/her understanding and acceptance.     Dental Advisory Given  Plan Discussed with: CRNA  Anesthesia Plan Comments:         Anesthesia Quick Evaluation

## 2022-04-20 NOTE — Progress Notes (Signed)
Patient arrived to floor alert and oriented. Patient sacrum is intact, but patient does have MASD in groin and under breasts. Patient is aware of possibility for surgery in am.

## 2022-04-20 NOTE — Transfer of Care (Addendum)
Immediate Anesthesia Transfer of Care Note  Patient: Yesenia [name]  Procedure(s) Performed: XI ROBOTIC LAPAROSCOPIC ASSISTED APPENDECTOMY (Abdomen)  Patient Location: PACU  Anesthesia Type:General  Level of Consciousness: awake and patient cooperative  Airway & Oxygen Therapy: Patient Spontanous Breathing and Patient connected to nasal cannula oxygen  Post-op Assessment: Report given to RN and Post -op Vital signs reviewed and stable  Post vital signs: Reviewed and stable  Last Vitals:  Vitals Value Taken Time  BP 135/68 04/20/22 1418  Temp 97.7   Pulse 85 04/20/22 1425  Resp 20 04/20/22 1425  SpO2 92% 04/20/22 1425  Vitals shown include unvalidated device data.  Last Pain:  Vitals:   04/20/22 1121  TempSrc: Oral  PainSc: 0-No pain         Complications: No notable events documented.

## 2022-04-20 NOTE — Progress Notes (Signed)
PROGRESS NOTE    Yesenia [name]  UYQ:[id number] DOB: 12/28/1950 DOA: 04/19/2022 PCP: Celene Squibb, MD   Brief Narrative:  HPI: Yesenia [name] is a 72 y.o. female with medical history significant of anxiety, DVT, hypertension, NASH, GERD, hypothyroidism, type 2 diabetes mellitus, and more presents the ED with a chief complaint of abdominal pain.  Patient reports the pain actually started 3 days ago.  It was sudden onset when it started.  She reports she was out shopping and felt like somebody had punched her in the stomach.  She doubled over in pain.  She reports that that night she had a lot of vomiting and diarrhea.  It was nonbloody.  She tried to drink 7-Up and take Antivert to relieve her symptoms but it did not work.  The pain became more intense and more constant.  She describes McBurney's point almost perfectly saying that her pain is located halfway between her umbilicus and her hip on the right.  She reports that the pain is dull and is constant at this time.  It is worse with positional changes.  She has subjective fever.  She has stopped vomiting and having diarrhea, which she attributes to nothing being left in her GI tract.  Her last meal was Thursday.  Her last bowel movement was Wednesday.  Patient has a very low potassium.  She denies muscle aches, chest pain, muscle twitching.  She reports she does have palpitations with chest pressure at times, but that is associated with anxiety.  She has not had the symptoms last 2 days.  Patient has no other complaints at this time. Patient does not smoke, does not drink, does not use illicit drugs.  She took 2 COVID shots and a flu shot last year.  She is full code.  Assessment & Plan:   Principal Problem:   Acute appendicitis with appendiceal abscess Active Problems:   Essential hypertension, benign   Hypothyroidism   Type II diabetes mellitus with neurological manifestations (HCC)   Sepsis ([place])  Sepsis secondary to acute  appendicitis with appendiceal abscess, POA: Patient meets criteria for sepsis based on tachycardia, tachypnea and leukocytosis, CT scan confirmed appendicitis and abscess as well as reactive enteritis.  Patient seen by general surgery, plan for surgery later today.  Continue antibiotics in the meantime.  Patient clinically feels better.  Type 2 diabetes mellitus: Continue to hold metformin, hemoglobin A1c pending.  Continue SSI, blood sugar controlled.  Essential hypertension: Blood pressure controlled, continue home dose of amlodipine and losartan and metoprolol.  Acquired hypothyroidism: Continue Synthroid.  Depression: Continue Elavil.  Anemia of chronic disease: Hemoglobin stable.  Peripheral neuropathy: Continue Cymbalta.  Morbid obesity: Patient will need counseling for weight loss and dietary modification later.  DVT prophylaxis: heparin injection 5,000 Units Start: 04/19/22 2200 SCDs Start: 04/19/22 1932   Code Status: Full Code  Family Communication:  None present at bedside.  Plan of care discussed with patient in length and he/she verbalized understanding and agreed with it.  Status is: Inpatient Remains inpatient appropriate because: Scheduled for surgery today.   Estimated body mass index is 44.81 kg/m as calculated from the following:   Height as of this encounter: '5\' 2"'$  (1.575 m).   Weight as of this encounter: 111.1 kg.    Nutritional Assessment: Body mass index is 44.81 kg/m.[name] Seen by dietician.  I agree with the assessment and plan as outlined below: Nutrition Status:        . Skin Assessment: I  have examined the patient's skin and I agree with the wound assessment as performed by the wound care RN as outlined below:    Consultants:  General surgery  Procedures:  As above  Antimicrobials:  Anti-infectives (From admission, onward)    Start     Dose/Rate Route Frequency Ordered Stop   04/20/22 0200  piperacillin-tazobactam (ZOSYN) IVPB 3.375 g   Status:  Discontinued        3.375 g 100 mL/hr over 30 Minutes Intravenous Every 8 hours 04/19/22 1931 04/19/22 1937   04/20/22 0200  piperacillin-tazobactam (ZOSYN) IVPB 3.375 g        3.375 g 12.5 mL/hr over 240 Minutes Intravenous Every 8 hours 04/19/22 1938     04/19/22 1830  piperacillin-tazobactam (ZOSYN) IVPB 3.375 g        3.375 g 100 mL/hr over 30 Minutes Intravenous  Once 04/19/22 1819 04/19/22 2206         Subjective: Patient seen and examined, she states that her abdominal pain is much better but she still has tenderness.  She has no nausea.  She is aware of the plan for surgery later today.  Objective: Vitals:   04/19/22 2215 04/20/22 0004 04/20/22 0405 04/20/22 0725  BP: 125/63 (!) 130/57 (!) 102/58 (!) 121/55  Pulse: 81 81 76 76  Resp: '17 20 16 18  '$ Temp:  99.4 F (37.4 C) 99.2 F (37.3 C) 99 F (37.2 C)  TempSrc:  Oral Oral   SpO2: 92% 92% 95% 92%  Weight:      Height:        Intake/Output Summary (Last 24 hours) at 04/20/2022 0811 Last data filed at 04/20/2022 6270 Gross per 24 hour  Intake 200 ml  Output --  Net 200 ml   Filed Weights   04/19/22 1354  Weight: 111.1 kg    Examination:  General exam: Appears calm and comfortable, obese Respiratory system: Clear to auscultation. Respiratory effort normal. Cardiovascular system: S1 & S2 heard, RRR. No JVD, murmurs, rubs, gallops or clicks. No pedal edema. Gastrointestinal system: Abdomen is nondistended, soft and generalized abdominal tenderness which is more pronounced at periumbilical as well as right lower quadrant. No organomegaly or masses felt. Normal bowel sounds heard. Central nervous system: Alert and oriented. No focal neurological deficits. Extremities: Symmetric 5 x 5 power. Skin: No rashes, lesions or ulcers Psychiatry: Judgement and insight appear normal. Mood & affect appropriate.    Data Reviewed: I have personally reviewed following labs and imaging studies  CBC: Recent Labs   Lab 04/19/22 1433 04/20/22 0441  WBC 15.4* 11.1*  NEUTROABS  --  8.5*  HGB 11.1* 9.2*  HCT 37.5 32.1*  MCV 83.3 84.9  PLT 392 350   Basic Metabolic Panel: Recent Labs  Lab 04/19/22 1433 04/20/22 0441  NA 135 135  K 2.7* 2.9*  CL 97* 99  CO2 26 25  GLUCOSE 135* 113*  BUN 23 21  CREATININE 1.16* 1.00  CALCIUM 9.3 8.5*  MG  --  2.4   GFR: Estimated Creatinine Clearance: 60.7 mL/min (by C-G formula based on SCr of 1 mg/dL). Liver Function Tests: Recent Labs  Lab 04/19/22 1433 04/20/22 0441  AST 16 14*  ALT 10 8  ALKPHOS 62 51  BILITOT 1.0 0.8  PROT 7.4 6.0*  ALBUMIN 3.3* 2.8*   Recent Labs  Lab 04/19/22 1433  LIPASE 35   No results for input(s): "AMMONIA" in the last 168 hours. Coagulation Profile: No results for input(s): "INR", "PROTIME" in  the last 168 hours. Cardiac Enzymes: No results for input(s): "CKTOTAL", "CKMB", "CKMBINDEX", "TROPONINI" in the last 168 hours. BNP (last 3 results) No results for input(s): "PROBNP" in the last 8760 hours. HbA1C: No results for input(s): "HGBA1C" in the last 72 hours. CBG: Recent Labs  Lab 04/19/22 2207 04/20/22 0724  GLUCAP 133* 122*   Lipid Profile: No results for input(s): "CHOL", "HDL", "LDLCALC", "TRIG", "CHOLHDL", "LDLDIRECT" in the last 72 hours. Thyroid Function Tests: No results for input(s): "TSH", "T4TOTAL", "FREET4", "T3FREE", "THYROIDAB" in the last 72 hours. Anemia Panel: No results for input(s): "VITAMINB12", "FOLATE", "FERRITIN", "TIBC", "IRON", "RETICCTPCT" in the last 72 hours. Sepsis Labs: No results for input(s): "PROCALCITON", "LATICACIDVEN" in the last 168 hours.  Recent Results (from the past 240 hour(s))  Resp panel by RT-PCR (RSV, Flu A&B, Covid) Anterior Nasal Swab     Status: None   Collection Time: 04/19/22  1:57 PM   Specimen: Anterior Nasal Swab  Result Value Ref Range Status   SARS Coronavirus 2 by RT PCR NEGATIVE NEGATIVE Final    Comment: (NOTE) SARS-CoV-2 target nucleic  acids are NOT DETECTED.  The SARS-CoV-2 RNA is generally detectable in upper respiratory specimens during the acute phase of infection. The lowest concentration of SARS-CoV-2 viral copies this assay can detect is 138 copies/mL. A negative result does not preclude SARS-Cov-2 infection and should not be used as the sole basis for treatment or other patient management decisions. A negative result may occur with  improper specimen collection/handling, submission of specimen other than nasopharyngeal swab, presence of viral mutation(s) within the areas targeted by this assay, and inadequate number of viral copies(<138 copies/mL). A negative result must be combined with clinical observations, patient history, and epidemiological information. The expected result is Negative.  Fact Sheet for Patients:  EntrepreneurPulse.com.au  Fact Sheet for Healthcare Providers:  IncredibleEmployment.be  This test is no t yet approved or cleared by the [place] FDA and  has been authorized for detection and/or diagnosis of SARS-CoV-2 by FDA under an Emergency Use Authorization (EUA). This EUA will remain  in effect (meaning this test can be used) for the duration of the COVID-19 declaration under Section 564(b)(1) of the Act, 21 U.S.C.section 360bbb-3(b)(1), unless the authorization is terminated  or revoked sooner.       Influenza A by PCR NEGATIVE NEGATIVE Final   Influenza B by PCR NEGATIVE NEGATIVE Final    Comment: (NOTE) The Xpert Xpress SARS-CoV-2/FLU/RSV plus assay is intended as an aid in the diagnosis of influenza from Nasopharyngeal swab specimens and should not be used as a sole basis for treatment. Nasal washings and aspirates are unacceptable for Xpert Xpress SARS-CoV-2/FLU/RSV testing.  Fact Sheet for Patients: EntrepreneurPulse.com.au  Fact Sheet for Healthcare Providers: IncredibleEmployment.be  This  test is not yet approved or cleared by the [place] FDA and has been authorized for detection and/or diagnosis of SARS-CoV-2 by FDA under an Emergency Use Authorization (EUA). This EUA will remain in effect (meaning this test can be used) for the duration of the COVID-19 declaration under Section 564(b)(1) of the Act, 21 U.S.C. section 360bbb-3(b)(1), unless the authorization is terminated or revoked.     Resp Syncytial Virus by PCR NEGATIVE NEGATIVE Final    Comment: (NOTE) Fact Sheet for Patients: EntrepreneurPulse.com.au  Fact Sheet for Healthcare Providers: IncredibleEmployment.be  This test is not yet approved or cleared by the [place] FDA and has been authorized for detection and/or diagnosis of SARS-CoV-2 by FDA under an Emergency Use Authorization (  EUA). This EUA will remain in effect (meaning this test can be used) for the duration of the COVID-19 declaration under Section 564(b)(1) of the Act, 21 U.S.C. section 360bbb-3(b)(1), unless the authorization is terminated or revoked.  Performed at [facility], [address] [place], [place] [address]      Radiology Studies: CT ABDOMEN PELVIS W CONTRAST  Result Date: 04/19/2022 CLINICAL DATA:  Right lower quadrant abdominal pain, nausea, vomiting and diarrhea for several days with body aches. EXAM: CT ABDOMEN AND PELVIS WITH CONTRAST TECHNIQUE: Multidetector CT imaging of the abdomen and pelvis was performed using the standard protocol following bolus administration of intravenous contrast. RADIATION DOSE REDUCTION: This exam was performed according to the departmental dose-optimization program which includes automated exposure control, adjustment of the mA and/or kV according to patient size and/or use of iterative reconstruction technique. CONTRAST:  193m OMNIPAQUE IOHEXOL 300 MG/ML  SOLN COMPARISON:  11/17/2011 CT abdomen/pelvis. 10/23/2018 CT angiogram of the chest, abdomen and  pelvis. FINDINGS: Lower chest: No significant pulmonary nodules or acute consolidative airspace disease. Hepatobiliary: Normal liver size. No liver mass. Cholecystectomy. No biliary ductal dilatation. Pancreas: Normal, with no mass or duct dilation. Spleen: Normal size. No mass. Adrenals/Urinary Tract: Right adrenal macroscopic fat density 4.0 cm mass compatible with myelolipoma, unchanged. No left adrenal nodules. No hydronephrosis. Simple 2.3 cm upper right renal cyst and scattered subcentimeter hypodense left renal cortical lesions that are too small to characterize, for which no follow-up imaging is recommended. Normal bladder. Stomach/Bowel: Moderate hiatal hernia. Otherwise normal nondistended stomach. Abnormal dilated (14 mm diameter) appendix with diffuse appendiceal wall thickening and hyperenhancement with periappendiceal fat stranding, compatible with acute appendicitis. There is a small 2.8 x 1.8 x 1.8 cm fluid collection abutting the terminal ileum, cecum and appendix in the right lower quadrant with internal 0.6 cm calcified stone suspicious for abscess (series 8/image 54). Mild wall thickening throughout the terminal ileum. Several mildly to moderately dilated small bowel loops throughout the abdomen measuring up to 4.4 cm diameter with air-fluid levels. Marked sigmoid diverticulosis without definite sigmoid wall thickening. Vascular/Lymphatic: Atherosclerotic nonaneurysmal abdominal aorta. Patent portal, splenic, hepatic and renal veins. No pathologically enlarged lymph nodes in the abdomen or pelvis. Reproductive: Status post hysterectomy, with no abnormal findings at the vaginal cuff. No adnexal mass. Other: Trace free fluid in the pelvis.  No free intraperitoneal air. Musculoskeletal: No aggressive appearing focal osseous lesions. Moderate thoracolumbar spondylosis. Bilateral posterior spinal fusion hardware at L4-5. IMPRESSION: 1. Dilated thick-walled appendix with periappendiceal fat stranding  compatible with acute appendicitis. Small 2.8 x 1.8 x 1.8 cm fluid collection abutting the terminal ileum, cecum and appendix in the right lower quadrant with internal 0.6 cm calcified stone suspicious for abscess. No free intraperitoneal air. 2. Mild wall thickening throughout the terminal ileum, most suggestive of reactive enteritis. 3. Several mildly to moderately dilated small bowel loops throughout the abdomen with air-fluid levels, favor reactive ileus. 4. Marked sigmoid diverticulosis. 5. Moderate hiatal hernia. 6. Stable right adrenal myelolipoma, for which no follow-up imaging is recommended. 7.  Aortic Atherosclerosis (ICD10-I70.0). Electronically Signed   By: JIlona SorrelM.D.   On: 04/19/2022 18:02    Scheduled Meds:  amitriptyline  10 mg Oral QHS   amLODipine  2.5 mg Oral Daily   Chlorhexidine Gluconate Cloth  6 each Topical Once   And   Chlorhexidine Gluconate Cloth  6 each Topical Once   DULoxetine  30 mg Oral QPM   heparin  5,000 Units Subcutaneous Q8H  insulin aspart  0-15 Units Subcutaneous TID WC   insulin aspart  0-5 Units Subcutaneous QHS   levothyroxine  150 mcg Oral Q0600   losartan  25 mg Oral Daily   metoprolol succinate  50 mg Oral Daily   Continuous Infusions:  0.9 % NaCl with KCl 40 mEq / L 100 mL/hr at 04/20/22 0423   piperacillin-tazobactam (ZOSYN)  IV 3.375 g (04/20/22 0426)   potassium chloride 10 mEq (04/20/22 0807)   potassium chloride       LOS: 1 day   Darliss Cheney, MD [facility]  04/20/2022, 8:11 AM   *Please note that this is a verbal dictation therefore any spelling or grammatical errors are due to the "[employer] [address] [employer]" system interpretation.  Please page via [place] and do not message via secure chat for urgent patient care matters. Secure chat can be used for non urgent patient care matters.  How to contact the [facility] Attending or Consulting provider [place] or covering provider during after hours [place], for this patient?  Check the  care team in [facility] and look for a) attending/consulting TRH provider listed and b) the [facility] team listed. Page or secure chat [phone]. Log into [email or web address] and use [place]'s universal password to access. If you do not have the password, please contact the hospital operator. Locate the [facility] provider you are looking for under [facility] and page to a number that you can be directly reached. If you still have difficulty reaching the provider, please page the [facility] (Director on Call) for the Hospitalists listed on amion for assistance.

## 2022-04-20 NOTE — Anesthesia Procedure Notes (Signed)
Procedure Name: Intubation Date/Time: 04/20/2022 12:44 PM  Performed by: Vista Deck, CRNAPre-anesthesia Checklist: Patient identified, Patient being monitored, Timeout performed, Emergency Drugs available and Suction available Patient Re-evaluated:Patient Re-evaluated prior to induction Oxygen Delivery Method: Circle system utilized Preoxygenation: Pre-oxygenation with 100% oxygen Induction Type: IV induction and Cricoid Pressure applied Laryngoscope Size: Mac and 3 Grade View: Grade I Tube type: Oral Tube size: 7.0 mm Number of attempts: 1 Airway Equipment and Method: Stylet Placement Confirmation: ETT inserted through vocal cords under direct vision, positive ETCO2 and breath sounds checked- equal and bilateral Secured at: 21 cm Tube secured with: Tape Dental Injury: Teeth and Oropharynx as per pre-operative assessment

## 2022-04-21 LAB — PHOSPHORUS: Phosphorus: 2.6 mg/dL (ref 2.5–4.6)

## 2022-04-21 LAB — BASIC METABOLIC PANEL
Anion gap: 10 (ref 5–15)
BUN: 17 mg/dL (ref 8–23)
CO2: 24 mmol/L (ref 22–32)
Calcium: 8.2 mg/dL — ABNORMAL LOW (ref 8.9–10.3)
Chloride: 102 mmol/L (ref 98–111)
Creatinine, Ser: 0.99 mg/dL (ref 0.44–1.00)
GFR, Estimated: >60 mL/min (ref >60)
Glucose, Bld: 107 mg/dL — ABNORMAL HIGH (ref 70–99)
Potassium: 3.7 mmol/L (ref 3.5–5.1)
Sodium: 136 mmol/L (ref 135–145)

## 2022-04-21 LAB — SURGICAL PATHOLOGY

## 2022-04-21 LAB — CBC
HCT: 33.2 % — ABNORMAL LOW (ref 36.0–46.0)
Hemoglobin: 9.4 g/dL — ABNORMAL LOW (ref 12.0–15.0)
MCH: 24.5 pg — ABNORMAL LOW (ref 26.0–34.0)
MCHC: 28.3 g/dL — ABNORMAL LOW (ref 30.0–36.0)
MCV: 86.7 fL (ref 80.0–100.0)
Platelets: 331 10*3/uL (ref 150–400)
RBC: 3.83 MIL/uL — ABNORMAL LOW (ref 3.87–5.11)
RDW: 17 % — ABNORMAL HIGH (ref 11.5–15.5)
WBC: 12.7 10*3/uL — ABNORMAL HIGH (ref 4.0–10.5)
nRBC: 0 % (ref 0.0–0.2)

## 2022-04-21 LAB — GLUCOSE, CAPILLARY
Glucose-Capillary: 125 mg/dL — ABNORMAL HIGH (ref 70–99)
Glucose-Capillary: 127 mg/dL — ABNORMAL HIGH (ref 70–99)
Glucose-Capillary: 85 mg/dL (ref 70–99)
Glucose-Capillary: 152 mg/dL — ABNORMAL HIGH (ref 70–99)

## 2022-04-21 LAB — MAGNESIUM: Magnesium: 2.1 mg/dL (ref 1.7–2.4)

## 2022-04-21 MED ORDER — POLYETHYLENE GLYCOL 3350 17 G PO PACK
17.0000 g | PACK | Freq: Every day | ORAL | Status: DC
  Administered 2022-04-21 – 2022-04-22 (×2): 17 g via ORAL
  Filled 2022-04-21: qty 1

## 2022-04-21 MED ORDER — KCL IN DEXTROSE-NACL 40-5-0.45 MEQ/L-%-% IV SOLN: INTRAVENOUS | Status: DC

## 2022-04-21 MED ORDER — ALUM & MAG HYDROXIDE-SIMETH 200-200-20 MG/5ML PO SUSP: 30.0000 mL | Freq: Four times a day (QID) | ORAL | Status: DC | PRN

## 2022-04-21 NOTE — Progress Notes (Signed)
Did very well today. Stayed in bed with a small amount of drainage. Pain is well controlled and patient will ask for the pain meds

## 2022-04-21 NOTE — Care Management Important Message (Signed)
Important Message  Patient Details  Name: Yesenia [name] MRN: [id number] Date of Birth: 06-03-1950   Medicare Important Message Given:  Yes (spoke with by phone at [phone] to review letter, no additional copy needed)     Tommy Medal 04/21/2022, 11:42 AM

## 2022-04-21 NOTE — Progress Notes (Signed)
Patient up and ambulated to the bathroom x 2 with this Probation officer. IV abx administered. No s/sx of adverse reaction noted at this time. Resident requested medication to help her sleep. Message send to Dr. Josephine Cables. 1 time order for Melatonin 6 mg ordered and administered. Melatonin effective. Around 0003 patient complains of abdominal pain 8/10. PRN morphine 2 mg administered and medication is effective. No BM during this shift and patient reports no flatulence.

## 2022-04-21 NOTE — TOC Progression Note (Signed)
Transition of Care [facility]) - Progression Note    Patient Details  Name: ZEKIAH COEN MRN: [id number] Date of Birth: 08-09-1950  Transition of Care [facility]) CM/SW Contact  Salome Arnt, [place] Phone Number: 04/21/2022, 10:16 AM  Clinical Narrative:   Transition of Care [facility]) Screening Note   Patient Details  Name: GENNAVIEVE HUQ Date of Birth: Jul 06, 1950   Transition [facility]) CM/SW Contact:    Salome Arnt, [place] Phone Number: 04/21/2022, 10:16 AM    Transition of Care Department [facility]) has reviewed patient and no TOC needs have been identified at this time. We will continue to monitor patient advancement through interdisciplinary progression rounds. If new patient transition needs arise, please place a TOC consult.            Expected Discharge Plan and Services                                               Social Determinants of Health (SDOH) Interventions SDOH Screenings   Food Insecurity: No Food Insecurity (04/20/2022)  Housing: Low Risk  (04/20/2022)  Transportation Needs: No Transportation Needs (04/20/2022)  Utilities: Not At Risk (04/20/2022)  Alcohol Screen: Low Risk  (05/22/2020)  Depression (PHQ2-9): Medium Risk (05/22/2020)  Financial Resource Strain: Low Risk  (02/21/2020)  Tobacco Use: Low Risk  (04/20/2022)    Readmission Risk Interventions     No data to display

## 2022-04-21 NOTE — Progress Notes (Signed)
1 Day Post-Op  Subjective: Minimal incisional pain postoperatively.  No bowel movement or flatus yet.  Objective: Vital signs in last 24 hours: Temp:  [97.7 F (36.5 C)-99 F (37.2 C)] 98.5 F (36.9 C) (01/16 0309) Pulse Rate:  [7-82] 78 (01/16 0309) Resp:  [13-20] 20 (01/16 0309) BP: (119-136)/(60-69) 119/63 (01/16 0309) SpO2:  [76 %-97 %] 97 % (01/16 0309) Last BM Date : 04/19/22  Intake/Output from previous day: 01/15 0701 - 01/16 0700 In: 2444.8 [P.O.:20; I.V.:2185; IV Piggyback:239.8] Out: 60 [Urine:40; Blood:20] Intake/Output this shift: No intake/output data recorded.  General appearance: alert, cooperative, and no distress Resp: clear to auscultation bilaterally Cardio: regular rate and rhythm, S1, S2 normal, no murmur, click, rub or gallop GI: Soft, incisions healing well.  JP drainage serous in nature.  No purulence present.  Minimal bowel sounds appreciated.  Lab Results:  Recent Labs    04/20/22 0441 04/21/22 0447  WBC 11.1* 12.7*  HGB 9.2* 9.4*  HCT 32.1* 33.2*  PLT 318 331   BMET Recent Labs    04/20/22 2143 04/21/22 0447  NA 134* 136  K 3.9 3.7  CL 102 102  CO2 22 24  GLUCOSE 118* 107*  BUN 18 17  CREATININE 1.05* 0.99  CALCIUM 8.2* 8.2*   PT/INR No results for input(s): "LABPROT", "INR" in the last 72 hours.  Studies/Results: CT ABDOMEN PELVIS W CONTRAST  Result Date: 04/19/2022 CLINICAL DATA:  Right lower quadrant abdominal pain, nausea, vomiting and diarrhea for several days with body aches. EXAM: CT ABDOMEN AND PELVIS WITH CONTRAST TECHNIQUE: Multidetector CT imaging of the abdomen and pelvis was performed using the standard protocol following bolus administration of intravenous contrast. RADIATION DOSE REDUCTION: This exam was performed according to the departmental dose-optimization program which includes automated exposure control, adjustment of the mA and/or kV according to patient size and/or use of iterative reconstruction technique.  CONTRAST:  18m OMNIPAQUE IOHEXOL 300 MG/ML  SOLN COMPARISON:  11/17/2011 CT abdomen/pelvis. 10/23/2018 CT angiogram of the chest, abdomen and pelvis. FINDINGS: Lower chest: No significant pulmonary nodules or acute consolidative airspace disease. Hepatobiliary: Normal liver size. No liver mass. Cholecystectomy. No biliary ductal dilatation. Pancreas: Normal, with no mass or duct dilation. Spleen: Normal size. No mass. Adrenals/Urinary Tract: Right adrenal macroscopic fat density 4.0 cm mass compatible with myelolipoma, unchanged. No left adrenal nodules. No hydronephrosis. Simple 2.3 cm upper right renal cyst and scattered subcentimeter hypodense left renal cortical lesions that are too small to characterize, for which no follow-up imaging is recommended. Normal bladder. Stomach/Bowel: Moderate hiatal hernia. Otherwise normal nondistended stomach. Abnormal dilated (14 mm diameter) appendix with diffuse appendiceal wall thickening and hyperenhancement with periappendiceal fat stranding, compatible with acute appendicitis. There is a small 2.8 x 1.8 x 1.8 cm fluid collection abutting the terminal ileum, cecum and appendix in the right lower quadrant with internal 0.6 cm calcified stone suspicious for abscess (series 8/image 54). Mild wall thickening throughout the terminal ileum. Several mildly to moderately dilated small bowel loops throughout the abdomen measuring up to 4.4 cm diameter with air-fluid levels. Marked sigmoid diverticulosis without definite sigmoid wall thickening. Vascular/Lymphatic: Atherosclerotic nonaneurysmal abdominal aorta. Patent portal, splenic, hepatic and renal veins. No pathologically enlarged lymph nodes in the abdomen or pelvis. Reproductive: Status post hysterectomy, with no abnormal findings at the vaginal cuff. No adnexal mass. Other: Trace free fluid in the pelvis.  No free intraperitoneal air. Musculoskeletal: No aggressive appearing focal osseous lesions. Moderate thoracolumbar  spondylosis. Bilateral posterior spinal fusion hardware at  L4-5. IMPRESSION: 1. Dilated thick-walled appendix with periappendiceal fat stranding compatible with acute appendicitis. Small 2.8 x 1.8 x 1.8 cm fluid collection abutting the terminal ileum, cecum and appendix in the right lower quadrant with internal 0.6 cm calcified stone suspicious for abscess. No free intraperitoneal air. 2. Mild wall thickening throughout the terminal ileum, most suggestive of reactive enteritis. 3. Several mildly to moderately dilated small bowel loops throughout the abdomen with air-fluid levels, favor reactive ileus. 4. Marked sigmoid diverticulosis. 5. Moderate hiatal hernia. 6. Stable right adrenal myelolipoma, for which no follow-up imaging is recommended. 7.  Aortic Atherosclerosis (ICD10-I70.0). Electronically Signed   By: Ilona Sorrel M.D.   On: 04/19/2022 18:02    Anti-infectives: Anti-infectives (From admission, onward)    Start     Dose/Rate Route Frequency Ordered Stop   04/20/22 0200  piperacillin-tazobactam (ZOSYN) IVPB 3.375 g  Status:  Discontinued        3.375 g 100 mL/hr over 30 Minutes Intravenous Every 8 hours 04/19/22 1931 04/19/22 1937   04/20/22 0200  piperacillin-tazobactam (ZOSYN) IVPB 3.375 g        3.375 g 12.5 mL/hr over 240 Minutes Intravenous Every 8 hours 04/19/22 1938     04/19/22 1830  piperacillin-tazobactam (ZOSYN) IVPB 3.375 g        3.375 g 100 mL/hr over 30 Minutes Intravenous  Once 04/19/22 1819 04/19/22 2206       Assessment/Plan: s/p Procedure(s): XI ROBOTIC LAPAROSCOPIC ASSISTED APPENDECTOMY Impression: Stable on postoperative day 1.  Awaiting return of bowel function.  Hypokalemia resolved.  Continue IV Zosyn for 5 days due to perforated appendicitis with abscess.  LOS: 2 days    Aviva Signs 04/21/2022

## 2022-04-21 NOTE — Anesthesia Postprocedure Evaluation (Signed)
Anesthesia Post Note  Patient: Yesenia [name]  Procedure(s) Performed: XI ROBOTIC LAPAROSCOPIC ASSISTED APPENDECTOMY (Abdomen)  Patient location during evaluation: Phase II Anesthesia Type: General Level of consciousness: awake Pain management: pain level controlled Vital Signs Assessment: post-procedure vital signs reviewed and stable Respiratory status: spontaneous breathing and respiratory function stable Cardiovascular status: blood pressure returned to baseline and stable Postop Assessment: no headache and no apparent nausea or vomiting Anesthetic complications: no Comments: Late entry   No notable events documented.   Last Vitals:  Vitals:   04/20/22 2147 04/21/22 0309  BP: 124/66 119/63  Pulse: 80 78  Resp: 20 20  Temp: 36.8 C 36.9 C  SpO2: 97% 97%    Last Pain:  Vitals:   04/21/22 0703  TempSrc:   PainSc: [place]

## 2022-04-21 NOTE — Progress Notes (Signed)
Patient with appendicitis, s/p laparoscopic appendectomy yesterday by Dr. Arnoldo Morale.  Discussed with Dr. Arnoldo Morale this morning, he has graciously agreed to accept this patient to general surgery service.  Hospitalist signing off.

## 2022-04-22 LAB — BASIC METABOLIC PANEL
Anion gap: 7 (ref 5–15)
BUN: 11 mg/dL (ref 8–23)
CO2: 26 mmol/L (ref 22–32)
Calcium: 8.4 mg/dL — ABNORMAL LOW (ref 8.9–10.3)
Chloride: 101 mmol/L (ref 98–111)
Creatinine, Ser: 0.96 mg/dL (ref 0.44–1.00)
GFR, Estimated: >60 mL/min (ref >60)
Glucose, Bld: 125 mg/dL — ABNORMAL HIGH (ref 70–99)
Potassium: 3.5 mmol/L (ref 3.5–5.1)
Sodium: 134 mmol/L — ABNORMAL LOW (ref 135–145)

## 2022-04-22 LAB — CBC
HCT: 31.6 % — ABNORMAL LOW (ref 36.0–46.0)
Hemoglobin: 8.9 g/dL — ABNORMAL LOW (ref 12.0–15.0)
MCH: 24.7 pg — ABNORMAL LOW (ref 26.0–34.0)
MCHC: 28.2 g/dL — ABNORMAL LOW (ref 30.0–36.0)
MCV: 87.5 fL (ref 80.0–100.0)
Platelets: 321 10*3/uL (ref 150–400)
RBC: 3.61 MIL/uL — ABNORMAL LOW (ref 3.87–5.11)
RDW: 17.1 % — ABNORMAL HIGH (ref 11.5–15.5)
WBC: 11.9 10*3/uL — ABNORMAL HIGH (ref 4.0–10.5)
nRBC: 0 % (ref 0.0–0.2)

## 2022-04-22 LAB — GLUCOSE, CAPILLARY
Glucose-Capillary: 131 mg/dL — ABNORMAL HIGH (ref 70–99)
Glucose-Capillary: 138 mg/dL — ABNORMAL HIGH (ref 70–99)
Glucose-Capillary: 118 mg/dL — ABNORMAL HIGH (ref 70–99)
Glucose-Capillary: 136 mg/dL — ABNORMAL HIGH (ref 70–99)

## 2022-04-22 LAB — MAGNESIUM: Magnesium: 2 mg/dL (ref 1.7–2.4)

## 2022-04-22 LAB — PHOSPHORUS: Phosphorus: 1.7 mg/dL — ABNORMAL LOW (ref 2.5–4.6)

## 2022-04-22 MED ORDER — POLYETHYLENE GLYCOL 3350 17 G PO PACK
17.0000 g | PACK | Freq: Two times a day (BID) | ORAL | Status: DC
  Administered 2022-04-22 – 2022-04-25 (×6): 17 g via ORAL
  Filled 2022-04-22 (×7): qty 1

## 2022-04-22 MED ORDER — POTASSIUM PHOSPHATES 15 MMOLE/5ML IV SOLN
30.0000 mmol | Freq: Once | INTRAVENOUS | Status: AC
  Administered 2022-04-22: 30 mmol via INTRAVENOUS
  Filled 2022-04-22: qty 10

## 2022-04-22 MED ORDER — AMOXICILLIN-POT CLAVULANATE 875-125 MG PO TABS
1.0000 | ORAL_TABLET | Freq: Two times a day (BID) | ORAL | Status: DC
  Administered 2022-04-22 – 2022-04-26 (×8): 1 via ORAL
  Filled 2022-04-22 (×8): qty 1

## 2022-04-22 MED ORDER — ORAL CARE MOUTH RINSE: 15.0000 mL | OROMUCOSAL | Status: DC | PRN

## 2022-04-22 NOTE — Progress Notes (Addendum)
2 Days Post-Op  Subjective: Patient has no complaints.  Has not had a bowel movement or flatus yet.  Denies any nausea or vomiting.  Denies any abdominal pain.  Objective: Vital signs in last 24 hours: Temp:  [97.7 F (36.5 C)-100.2 F (37.9 C)] 97.7 F (36.5 C) (01/16 2042) Pulse Rate:  [73-77] 73 (01/16 2042) Resp:  [16-20] 20 (01/16 2042) BP: (101-128)/(59-71) 128/59 (01/16 2042) SpO2:  [96 %-99 %] 96 % (01/16 2042) Last BM Date : 04/19/22  Intake/Output from previous day: 01/16 0701 - 01/17 0700 In: 1920.3 [P.O.:490; I.V.:1279.6; IV Piggyback:150.7] Out: 15 [Drains:15] Intake/Output this shift: Total I/O In: 120 [P.O.:120] Out: -   General appearance: alert, cooperative, and no distress Resp: clear to auscultation bilaterally Cardio: regular rate and rhythm, S1, S2 normal, no murmur, click, rub or gallop GI: Soft, incisions healing well.  JP drainage serous in nature.  Lab Results:  Recent Labs    04/21/22 0447 04/22/22 0323  WBC 12.7* 11.9*  HGB 9.4* 8.9*  HCT 33.2* 31.6*  PLT 331 321   BMET Recent Labs    04/21/22 0447 04/22/22 0323  NA 136 134*  K 3.7 3.5  CL 102 101  CO2 24 26  GLUCOSE 107* 125*  BUN 17 11  CREATININE 0.99 0.96  CALCIUM 8.2* 8.4*   PT/INR No results for input(s): "LABPROT", "INR" in the last 72 hours.  Studies/Results: No results found.  Anti-infectives: Anti-infectives (From admission, onward)    Start     Dose/Rate Route Frequency Ordered Stop   04/20/22 0200  piperacillin-tazobactam (ZOSYN) IVPB 3.375 g  Status:  Discontinued        3.375 g 100 mL/hr over 30 Minutes Intravenous Every 8 hours 04/19/22 1931 04/19/22 1937   04/20/22 0200  piperacillin-tazobactam (ZOSYN) IVPB 3.375 g        3.375 g 12.5 mL/hr over 240 Minutes Intravenous Every 8 hours 04/19/22 1938 04/25/22 0559   04/19/22 1830  piperacillin-tazobactam (ZOSYN) IVPB 3.375 g        3.375 g 100 mL/hr over 30 Minutes Intravenous  Once 04/19/22 1819 04/19/22  2206       Assessment/Plan: s/p Procedure(s): XI ROBOTIC LAPAROSCOPIC ASSISTED APPENDECTOMY Impression: Stable on postoperative day 2.  Awaiting return of bowel function.  Hypophosphatemia will be treated.  Continue IV antibiotics.  The patient was not septic at the time of admission.  LOS: 3 days    Aviva Signs 04/22/2022

## 2022-04-22 NOTE — Progress Notes (Signed)
Pharmacy Electrolyte Replacement  Recent Labs:  Recent Labs    04/22/22 0323  K 3.5  MG 2.0  PHOS 1.7*  CREATININE 0.96    Low Critical Values (K </= 2.5, Phos </= 1, Mg </= 1) Present: Phos = 1.7 Assessment: patient is post op D#2, for lap appendectomy. Waiting for bowel function to return.  Plan: KPhos 93mol IV over 6 hours F/U phos in AM  LIsac Sarna BS Pharm D, BCPS Clinical Pharmacist

## 2022-04-23 LAB — GLUCOSE, CAPILLARY
Glucose-Capillary: 109 mg/dL — ABNORMAL HIGH (ref 70–99)
Glucose-Capillary: 107 mg/dL — ABNORMAL HIGH (ref 70–99)
Glucose-Capillary: 100 mg/dL — ABNORMAL HIGH (ref 70–99)
Glucose-Capillary: 99 mg/dL (ref 70–99)

## 2022-04-23 LAB — BASIC METABOLIC PANEL
Anion gap: 6 (ref 5–15)
BUN: 8 mg/dL (ref 8–23)
CO2: 27 mmol/L (ref 22–32)
Calcium: 8.9 mg/dL (ref 8.9–10.3)
Chloride: 100 mmol/L (ref 98–111)
Creatinine, Ser: 0.84 mg/dL (ref 0.44–1.00)
GFR, Estimated: >60 mL/min (ref >60)
Glucose, Bld: 112 mg/dL — ABNORMAL HIGH (ref 70–99)
Potassium: 3.7 mmol/L (ref 3.5–5.1)
Sodium: 133 mmol/L — ABNORMAL LOW (ref 135–145)

## 2022-04-23 LAB — PHOSPHORUS: Phosphorus: 2.7 mg/dL (ref 2.5–4.6)

## 2022-04-23 MED ORDER — BISACODYL 10 MG RE SUPP
10.0000 mg | Freq: Once | RECTAL | Status: AC
  Administered 2022-04-23: 10 mg via RECTAL
  Filled 2022-04-23: qty 1

## 2022-04-23 MED ORDER — MAGNESIUM HYDROXIDE 400 MG/5ML PO SUSP
30.0000 mL | Freq: Three times a day (TID) | ORAL | Status: DC
  Administered 2022-04-23 – 2022-04-26 (×8): 30 mL via ORAL
  Filled 2022-04-23 (×11): qty 30

## 2022-04-23 NOTE — TOC Initial Note (Addendum)
Transition of Care [facility]) - Initial/Assessment Note    Patient Details  Name: Yesenia [name] MRN: [id number] Date of Birth: [birth year]  Transition of Care [facility]) CM/SW Contact:    Boneta Lucks, RN Phone Number: 04/23/2022, 2:39 PM  Clinical Narrative:    Patient admitted with acute appendicitis with appendiceal abscess. PT is recommending HHPT. Patient is agreeable. She has used [place] in the past. [name] accepted the referral. MD aware to order.  Added to AVS. Patient state she has a walker. No DME orders needed.               Expected Discharge Plan: [place] Barriers to Discharge: Continued Medical Work up   Patient Goals and CMS Choice Patient states their goals for this hospitalization and ongoing recovery are:: to go home. CMS Medicare.gov Compare Post Acute Care list provided to:: Patient Choice offered to / list presented to : Patient     Expected Discharge Plan and Services        HH Arranged: PT [facility] Agency: [place] Date [place]: 04/23/22 Time HH Agency Contacted: 49 Representative spoke with at [place]: [name]  Prior Living Arrangements/Services     Patient language and need for interpreter reviewed:: Yes Do you feel safe going back to the place where you live?: Yes      Need for Family Participation in Patient Care: Yes (Comment) Care giver support system in place?: Yes (comment)   Criminal Activity/Legal Involvement Pertinent to Current Situation/Hospitalization: No - Comment as needed  Activities of Daily Living Home Assistive Devices/Equipment: None ADL Screening (condition at time of admission) Patient's cognitive ability adequate to safely complete daily activities?: Yes Is the patient deaf or have difficulty hearing?: No Does the patient have difficulty seeing, even when wearing glasses/contacts?: No Does the patient have difficulty concentrating, remembering, or making decisions?: No Patient able  to express need for assistance with ADLs?: Yes Does the patient have difficulty dressing or bathing?: No Independently performs ADLs?: Yes (appropriate for developmental age) Does the patient have difficulty walking or climbing stairs?: No Weakness of Legs: None Weakness of Arms/Hands: None  Permission Sought/Granted    Emotional Assessment     Affect (typically observed): Accepting Orientation: : Oriented to Self, Oriented to Place, Oriented to  Time, Oriented to Situation Alcohol / Substance Use: Not Applicable Psych Involvement: No (comment)  Admission diagnosis:  Hypokalemia [E87.6] Perforated appendicitis [K35.32] Acute appendicitis with perforation and localized peritonitis [K35.32] Patient Active Problem List   Diagnosis Date Noted   Sepsis ([place]) 04/20/2022   Perforated appendicitis 04/19/2022   Acute appendicitis with appendiceal abscess 04/19/2022   Chest pain 08/09/2020   Low back pain 07/24/2020   Thigh pain 03/06/2020   Heart murmur 94/70/9628   Diastolic dysfunction 36/62/9476   Osteoporosis 02/18/2016   Proximal humerus fracture 01/12/2016   Fatty liver 09/04/2015   Small bowel lesion    Neurotic excoriations 05/14/2015   Constipation 05/09/2015   History of colonic polyps    Diverticulosis of colon without hemorrhage    Mucosal abnormality of stomach    Mucosal abnormality of esophagus    Reflux esophagitis    Dysphagia    FH: colon cancer 12/19/2014   Hyperlipemia 09/06/2013   MDD (major depressive disorder) 09/06/2013   Previous back surgery 11/04/2012   DVT (deep venous thrombosis) (HCC)    PE (pulmonary embolism)    CKD (chronic kidney disease), stage II 05/17/2012   Diabetic neuropathy (HCC)  03/18/2012   Sleep apnea 10/28/2011   Type II diabetes mellitus with neurological manifestations ([place]) 10/28/2011   Fibromyalgia 10/20/2011   Essential hypertension, benign 09/08/2011   Hypothyroidism 09/08/2011   DDD (degenerative disc disease), lumbar  09/08/2011   Class 3 obesity ([place]) 09/08/2011   Anxiety 09/08/2011   GERD 01/08/2010   SPINAL STENOSIS, LUMBAR 09/16/2009   PCP:  Celene Squibb, MD Pharmacy:   [facility], [place] [address] [place] [address] Phone: [phone] Fax: [phone]     Social Determinants of Health (SDOH) Social History: [place]: No Food Insecurity (04/20/2022)  Housing: Low Risk  (04/20/2022)  Transportation Needs: No Transportation Needs (04/20/2022)  Utilities: Not At Risk (04/20/2022)  Alcohol Screen: Low Risk  (05/22/2020)  Depression (PHQ2-9): Medium Risk (05/22/2020)  Financial Resource Strain: Low Risk  (02/21/2020)  Tobacco Use: Low Risk  (04/20/2022)   SDOH Interventions:   Readmission Risk Interventions    04/23/2022    2:39 PM  Readmission Risk Prevention Plan  Post Dischage Appt Not Complete  Medication Screening Complete  Transportation Screening Complete

## 2022-04-23 NOTE — Progress Notes (Signed)
3 Days Post-Op  Subjective: Patient denies any abdominal pain or emesis.  No bowel movement yet.  No flatus yet.  Objective: Vital signs in last 24 hours: Temp:  [97.1 F (36.2 C)-98.7 F (37.1 C)] 97.1 F (36.2 C) (01/18 0452) Pulse Rate:  [69-73] 72 (01/18 0452) Resp:  [14-18] 16 (01/18 0452) BP: (104-121)/(55-61) 121/55 (01/18 0452) SpO2:  [97 %] 97 % (01/18 0452) Last BM Date : 04/19/22  Intake/Output from previous day: 01/17 0701 - 01/18 0700 In: 1682.4 [P.O.:600; I.V.:795.2; IV Piggyback:287.2] Out: 5 [Drains:5] Intake/Output this shift: No intake/output data recorded.  General appearance: alert, cooperative, and no distress Resp: clear to auscultation bilaterally Cardio: regular rate and rhythm, S1, S2 normal, no murmur, click, rub or gallop GI: Soft, nontender.  Incisions healing well.  JP drainage serous in nature.  Occasional bowel sounds appreciated.  Lab Results:  Recent Labs    04/21/22 0447 04/22/22 0323  WBC 12.7* 11.9*  HGB 9.4* 8.9*  HCT 33.2* 31.6*  PLT 331 321   BMET Recent Labs    04/22/22 0323 04/23/22 0329  NA 134* 133*  K 3.5 3.7  CL 101 100  CO2 26 27  GLUCOSE 125* 112*  BUN 11 8  CREATININE 0.96 0.84  CALCIUM 8.4* 8.9   PT/INR No results for input(s): "LABPROT", "INR" in the last 72 hours.  Studies/Results: No results found.  Anti-infectives: Anti-infectives (From admission, onward)    Start     Dose/Rate Route Frequency Ordered Stop   04/22/22 2200  amoxicillin-clavulanate (AUGMENTIN) 875-125 MG per tablet 1 tablet        1 tablet Oral Every 12 hours 04/22/22 1659     04/20/22 0200  piperacillin-tazobactam (ZOSYN) IVPB 3.375 g  Status:  Discontinued        3.375 g 100 mL/hr over 30 Minutes Intravenous Every 8 hours 04/19/22 1931 04/19/22 1937   04/20/22 0200  piperacillin-tazobactam (ZOSYN) IVPB 3.375 g  Status:  Discontinued        3.375 g 12.5 mL/hr over 240 Minutes Intravenous Every 8 hours 04/19/22 1938 04/22/22 1659    04/19/22 1830  piperacillin-tazobactam (ZOSYN) IVPB 3.375 g        3.375 g 100 mL/hr over 30 Minutes Intravenous  Once 04/19/22 1819 04/19/22 2206       Assessment/Plan: s/p Procedure(s): XI ROBOTIC LAPAROSCOPIC ASSISTED APPENDECTOMY Impression: Stable on postoperative day 3.  Awaiting full return of bowel function.  Hypophosphatemia has resolved.  Patient lost IV access and is a difficult stick.  Have switched to p.o. Augmentin.  Will get physical therapy to assess ambulation.  Have added milk of magnesia to patient's oral regimen.  LOS: 4 days    Aviva Signs 04/23/2022

## 2022-04-23 NOTE — Evaluation (Signed)
Physical Therapy Evaluation Patient Details Name: Yesenia [name] MRN: [id number] DOB: 1950-06-19 Today's Date: 04/23/2022  History of Present Illness  Yesenia [name] is a 72 y.o. female s/p Robotic assisted laparoscopic appendectomy on 04/20/22 with medical history significant of anxiety, DVT, hypertension, NASH, GERD, hypothyroidism, type 2 diabetes mellitus, and more presents the ED with a chief complaint of abdominal pain.  Patient reports the pain actually started 3 days ago.  It was sudden onset when it started.  She reports she was out shopping and felt like somebody had punched her in the stomach.  She doubled over in pain.  She reports that that night she had a lot of vomiting and diarrhea.  It was nonbloody.  She tried to drink 7-Up and take Antivert to relieve her symptoms but it did not work.  The pain became more intense and more constant.  She describes McBurney's point almost perfectly saying that her pain is located halfway between her umbilicus and her hip on the right.  She reports that the pain is dull and is constant at this time.  It is worse with positional changes.  She has subjective fever.  She has stopped vomiting and having diarrhea, which she attributes to nothing being left in her GI tract.  Her last meal was Thursday.  Her last bowel movement was Wednesday.  Patient has a very low potassium.  She denies muscle aches, chest pain, muscle twitching.  She reports she does have palpitations with chest pressure at times, but that is associated with anxiety.  She has not had the symptoms last 2 days.  Patient has no other complaints at this time.  Patient does not smoke, does not drink, does not use illicit drugs.  She took 2 COVID shots and a flu shot last year.  She is full code.  Review of Systems: As mentioned in the history of present illness. All other systems reviewed and are negative.   Clinical Impression  Patient demonstrates slow labored movement for sitting up at  bedside with c/o abdominal discomfort at surgical site, unsteady labored movement during transfer to chair without AD, required use of RW for safety and able to ambulate to doorway, but limited mostly due to c/o lightheadedness and SpO2 dropping from 96% to 82% on room air.  Patient demonstrates fair/good return for transferring to/from commode in bathroom and tolerated sitting up in chair after therapy with 2 LPM O2 put back on - nurse notified.  Patient will benefit from continued skilled physical therapy in hospital and recommended venue below to increase strength, balance, endurance for safe ADLs and gait.         Recommendations for follow up therapy are one component of a multi-disciplinary discharge planning process, led by the attending physician.  Recommendations may be updated based on patient status, additional functional criteria and insurance authorization.  Follow Up Recommendations Home health PT      Assistance Recommended at Discharge Set up Supervision/Assistance  Patient can return home with the following  A little help with walking and/or transfers;A little help with bathing/dressing/bathroom;Help with stairs or ramp for entrance;Assistance with cooking/housework    Equipment Recommendations Rolling walker (2 wheels)  Recommendations for Other Services       Functional Status Assessment Patient has had a recent decline in their functional status and demonstrates the ability to make significant improvements in function in a reasonable and predictable amount of time.     Precautions / Restrictions Precautions Precautions: Fall Restrictions Weight  Bearing Restrictions: No      Mobility  Bed Mobility Overal bed mobility: Needs Assistance Bed Mobility: Supine to Sit     Supine to sit: Supervision     General bed mobility comments: increased time, labored movement    Transfers Overall transfer level: Needs assistance Equipment used: Rolling walker (2 wheels),  None Transfers: Sit to/from Stand, Bed to chair/wheelchair/BSC Sit to Stand: Min guard   Step pivot transfers: Min guard       General transfer comment: unsteady labored movement having to lean on armrest of chair for support when not using an AD, required use of RW for safety    Ambulation/Gait Ambulation/Gait assistance: Min guard, Min assist Gait Distance (Feet): 22 Feet Assistive device: Rolling walker (2 wheels) Gait Pattern/deviations: Decreased step length - right, Decreased step length - left, Decreased stride length Gait velocity: decreased     General Gait Details: slow labored unsteady cadence limited mostly due to c/o lightheadedness, had to use RW for safety  Stairs            Wheelchair Mobility    Modified Rankin (Stroke Patients Only)       Balance Overall balance assessment: Needs assistance Sitting-balance support: Feet supported, No upper extremity supported Sitting balance-Leahy Scale: Fair Sitting balance - Comments: fair/good seated at EOB   Standing balance support: During functional activity, No upper extremity supported Standing balance-Leahy Scale: Poor Standing balance comment: fair/good using RW                             Pertinent Vitals/Pain Pain Assessment Pain Assessment: 0-10 Pain Score: 6  Pain Location: stomach at surgical site Pain Descriptors / Indicators: Sore, Discomfort Pain Intervention(s): Limited activity within patient's tolerance, Monitored during session, Repositioned    Home Living Family/patient expects to be discharged to:: Private residence Living Arrangements: Children Available Help at Discharge: Family;Available 24 hours/day Type of Home: House Home Access: Stairs to enter   [employer] of Steps: 2   Home Layout: One level Home Equipment: Shower seat;Grab bars - tub/shower      Prior Function Prior Level of Function : Independent/Modified Independent              Mobility Comments: Community ambulator without AD, does not drive ADLs Comments: Independent other than son driving for her     Hand Dominance   Dominant Hand: Right    Extremity/Trunk Assessment   Upper Extremity Assessment Upper Extremity Assessment: Generalized weakness    Lower Extremity Assessment Lower Extremity Assessment: Generalized weakness    Cervical / Trunk Assessment Cervical / Trunk Assessment: Normal  Communication   Communication: No difficulties  Cognition Arousal/Alertness: Awake/alert Behavior During Therapy: WFL for tasks assessed/performed Overall Cognitive Status: Within Functional Limits for tasks assessed                                          General Comments      Exercises     Assessment/Plan    PT Assessment Patient needs continued PT services  PT Problem List Decreased strength;Decreased activity tolerance;Decreased balance;Decreased mobility       PT Treatment Interventions DME instruction;Gait training;Stair training;Functional mobility training;Therapeutic activities;Therapeutic exercise;Balance training;Patient/family education    PT Goals (Current goals can be found in the Care Plan section)  Acute Rehab PT Goals Patient Stated Goal:  return home with family to assist PT Goal Formulation: With patient Time For Goal Achievement: 04/27/22 Potential to Achieve Goals: Good    Frequency Min 3X/week     Co-evaluation               AM-PAC PT "6 Clicks" Mobility  Outcome Measure Help needed turning from your back to your side while in a flat bed without using bedrails?: None Help needed moving from lying on your back to sitting on the side of a flat bed without using bedrails?: A Little Help needed moving to and from a bed to a chair (including a wheelchair)?: A Little Help needed standing up from a chair using your arms (e.g., wheelchair or bedside chair)?: A Little Help needed to walk in hospital  room?: A Little Help needed climbing 3-5 steps with a railing? : A Lot 6 Click Score: 18    End of Session Equipment Utilized During Treatment: Oxygen Activity Tolerance: Patient tolerated treatment well;Patient limited by fatigue Patient left: in chair;with call bell/phone within reach Nurse Communication: Mobility status PT Visit Diagnosis: Unsteadiness on feet (R26.81);Other abnormalities of gait and mobility (R26.89);Muscle weakness (generalized) (M62.81)    Time: 7416-3845 PT Time Calculation (min) (ACUTE ONLY): 27 min   Charges:   PT Evaluation $PT Eval Moderate Complexity: 1 Mod PT Treatments $Therapeutic Activity: 23-37 mins        2:01 PM, 04/23/22 Lonell Grandchild, MPT Physical Therapist with [facility] [phone] office [phone] mobile phone

## 2022-04-23 NOTE — Plan of Care (Signed)
  Problem: Acute Rehab PT Goals(only PT should resolve) Goal: Pt Will Go Supine/Side To Sit Outcome: Progressing Flowsheets (Taken 04/23/2022 1403) Pt will go Supine/Side to Sit: with modified independence Goal: Patient Will Transfer Sit To/From Stand Outcome: Progressing Flowsheets (Taken 04/23/2022 1403) Patient will transfer sit to/from stand:  with modified independence  with supervision Goal: Pt Will Transfer Bed To Chair/Chair To Bed Outcome: Progressing Flowsheets (Taken 04/23/2022 1403) Pt will Transfer Bed to Chair/Chair to Bed:  with modified independence  with supervision Goal: Pt Will Ambulate Outcome: Progressing Flowsheets (Taken 04/23/2022 1403) Pt will Ambulate:  75 feet  with supervision  with min guard assist  with rolling walker   2:03 PM, 04/23/22 Lonell Grandchild, MPT Physical Therapist with [facility] [phone] office [phone] mobile phone

## 2022-04-24 ENCOUNTER — Encounter (HOSPITAL_COMMUNITY): Payer: Self-pay | Admitting: General Surgery

## 2022-04-24 LAB — GLUCOSE, CAPILLARY
Glucose-Capillary: 97 mg/dL (ref 70–99)
Glucose-Capillary: 98 mg/dL (ref 70–99)

## 2022-04-24 NOTE — Care Management Important Message (Signed)
Important Message  Patient Details  Name: Yesenia [name] MRN: [id number] Date of Birth: 1951/03/24   Medicare Important Message Given:  Yes     Tommy Medal 04/24/2022, 1:50 PM

## 2022-04-24 NOTE — Progress Notes (Signed)
4 Days Post-Op  Subjective: Patient did have a small bowel movement yesterday evening after Dulcolax suppository.  Does have some abdominal cramping.  Objective: Vital signs in last 24 hours: Temp:  [97 F (36.1 C)-98.6 F (37 C)] 98.1 F (36.7 C) (01/19 1222) Pulse Rate:  [70-76] 75 (01/19 1222) Resp:  [16-18] 18 (01/19 1222) BP: (105-133)/(64-75) 128/75 (01/19 1222) SpO2:  [90 %-95 %] 90 % (01/19 1222) Last BM Date : 04/24/22  Intake/Output from previous day: 01/18 0701 - 01/19 0700 In: 730 [P.O.:730] Out: 5 [Drains:5] Intake/Output this shift: Total I/O In: 480 [P.O.:480] Out: -   General appearance: alert, cooperative, and no distress Resp: clear to auscultation bilaterally Cardio: regular rate and rhythm, S1, S2 normal, no murmur, click, rub or gallop GI: Incisions healing well.  JP drainage serous in nature.  Nonspecific tenderness to the abdomen.  Lab Results:  Recent Labs    04/22/22 0323  WBC 11.9*  HGB 8.9*  HCT 31.6*  PLT 321   BMET Recent Labs    04/22/22 0323 04/23/22 0329  NA 134* 133*  K 3.5 3.7  CL 101 100  CO2 26 27  GLUCOSE 125* 112*  BUN 11 8  CREATININE 0.96 0.84  CALCIUM 8.4* 8.9   PT/INR No results for input(s): "LABPROT", "INR" in the last 72 hours.  Studies/Results: No results found.  Anti-infectives: Anti-infectives (From admission, onward)    Start     Dose/Rate Route Frequency Ordered Stop   04/22/22 2200  amoxicillin-clavulanate (AUGMENTIN) 875-125 MG per tablet 1 tablet        1 tablet Oral Every 12 hours 04/22/22 1659     04/20/22 0200  piperacillin-tazobactam (ZOSYN) IVPB 3.375 g  Status:  Discontinued        3.375 g 100 mL/hr over 30 Minutes Intravenous Every 8 hours 04/19/22 1931 04/19/22 1937   04/20/22 0200  piperacillin-tazobactam (ZOSYN) IVPB 3.375 g  Status:  Discontinued        3.375 g 12.5 mL/hr over 240 Minutes Intravenous Every 8 hours 04/19/22 1938 04/22/22 1659   04/19/22 1830  piperacillin-tazobactam  (ZOSYN) IVPB 3.375 g        3.375 g 100 mL/hr over 30 Minutes Intravenous  Once 04/19/22 1819 04/19/22 2206       Assessment/Plan: s/p Procedure(s): XI ROBOTIC LAPAROSCOPIC ASSISTED APPENDECTOMY Impression: Postoperative day 4, bowel function is starting to return.  She has been evaluated by physical therapy and home PT is being arranged.  Will see how the patient does over the next 24 hours.  LOS: 5 days    Aviva Signs 04/24/2022

## 2022-04-24 NOTE — Progress Notes (Signed)
Physical Therapy Treatment Patient Details Name: Yesenia [name] MRN: [id number] DOB: 10/10/1950 Today's Date: 04/24/2022   History of Present Illness Yesenia [name] is a 72 y.o. female s/p Robotic assisted laparoscopic appendectomy on 04/20/22 with medical history significant of anxiety, DVT, hypertension, NASH, GERD, hypothyroidism, type 2 diabetes mellitus, and more presents the ED with a chief complaint of abdominal pain.  Patient reports the pain actually started 3 days ago.  It was sudden onset when it started.  She reports she was out shopping and felt like somebody had punched her in the stomach.  She doubled over in pain.  She reports that that night she had a lot of vomiting and diarrhea.  It was nonbloody.  She tried to drink 7-Up and take Antivert to relieve her symptoms but it did not work.  The pain became more intense and more constant.  She describes McBurney's point almost perfectly saying that her pain is located halfway between her umbilicus and her hip on the right.  She reports that the pain is dull and is constant at this time.  It is worse with positional changes.  She has subjective fever.  She has stopped vomiting and having diarrhea, which she attributes to nothing being left in her GI tract.  Her last meal was Thursday.  Her last bowel movement was Wednesday.  Patient has a very low potassium.  She denies muscle aches, chest pain, muscle twitching.  She reports she does have palpitations with chest pressure at times, but that is associated with anxiety.  She has not had the symptoms last 2 days.  Patient has no other complaints at this time.  Patient does not smoke, does not drink, does not use illicit drugs.  She took 2 COVID shots and a flu shot last year.  She is full code.  Review of Systems: As mentioned in the history of present illness. All other systems reviewed and are negative.    PT Comments    Patient does not require assist for bed mobility and demonstrates good  sitting balance and sitting tolerance at EOB while completing exercises. She transfers to standing and ambulates with RW with slow, labored mobility. Patient requires 1 standing rest break while ambulating with c/o dizziness and fatigue but is able to ambulate back to bed. Patient will benefit from continued skilled physical therapy in hospital and recommended venue below to increase strength, balance, endurance for safe ADLs and gait.    Recommendations for follow up therapy are one component of a multi-disciplinary discharge planning process, led by the attending physician.  Recommendations may be updated based on patient status, additional functional criteria and insurance authorization.  Follow Up Recommendations  Home health PT     Assistance Recommended at Discharge Set up Supervision/Assistance  Patient can return home with the following A little help with walking and/or transfers;A little help with bathing/dressing/bathroom;Help with stairs or ramp for entrance;Assistance with cooking/housework   Equipment Recommendations  Rolling walker (2 wheels)    Recommendations for Other Services       Precautions / Restrictions Precautions Precautions: Fall Restrictions Weight Bearing Restrictions: No     Mobility  Bed Mobility Overal bed mobility: Modified Independent             General bed mobility comments: increased time, labored movement    Transfers Overall transfer level: Needs assistance Equipment used: Rolling walker (2 wheels) Transfers: Sit to/from Stand Sit to Stand: Min guard   Step pivot transfers: Min guard  Ambulation/Gait Ambulation/Gait assistance: Min guard, Min assist Gait Distance (Feet): 30 Feet Assistive device: Rolling walker (2 wheels) Gait Pattern/deviations: Decreased step length - right, Decreased step length - left, Decreased stride length Gait velocity: decreased     General Gait Details: slow labored unsteady cadence  limited mostly due to c/o lightheadedness requiring standing rest break leaning against wall while holding RW   Stairs             Wheelchair Mobility    Modified Rankin (Stroke Patients Only)       Balance Overall balance assessment: Needs assistance Sitting-balance support: Feet supported, No upper extremity supported Sitting balance-Leahy Scale: Fair Sitting balance - Comments: fair/good seated at EOB   Standing balance support: During functional activity, Bilateral upper extremity supported Standing balance-Leahy Scale: Good Standing balance comment: fair/good using RW                            Cognition Arousal/Alertness: Awake/alert Behavior During Therapy: WFL for tasks assessed/performed Overall Cognitive Status: Within Functional Limits for tasks assessed                                          Exercises General Exercises - Lower Extremity Long Arc Quad: AROM, Both, 10 reps, Seated Hip Flexion/Marching: AROM, Both, 10 reps, Seated Toe Raises: AROM, Both, 10 reps, Seated Heel Raises: AROM, Both, 10 reps, Seated    General Comments        Pertinent Vitals/Pain Pain Assessment Pain Score: 6  Pain Location: stomach at surgical site Pain Descriptors / Indicators: Sore, Discomfort Pain Intervention(s): Limited activity within patient's tolerance, Monitored during session, Repositioned    Home Living                          Prior Function            PT Goals (current goals can now be found in the care plan section) Acute Rehab PT Goals Patient Stated Goal: return home with family to assist PT Goal Formulation: With patient Time For Goal Achievement: 04/27/22 Potential to Achieve Goals: Good Progress towards PT goals: Progressing toward goals    Frequency    Min 3X/week      PT Plan Current plan remains appropriate    Co-evaluation              AM-PAC PT "6 Clicks" Mobility   Outcome  Measure  Help needed turning from your back to your side while in a flat bed without using bedrails?: None Help needed moving from lying on your back to sitting on the side of a flat bed without using bedrails?: A Little Help needed moving to and from a bed to a chair (including a wheelchair)?: A Little Help needed standing up from a chair using your arms (e.g., wheelchair or bedside chair)?: A Little Help needed to walk in hospital room?: A Little Help needed climbing 3-5 steps with a railing? : A Lot 6 Click Score: 18    End of Session Equipment Utilized During Treatment: Gait belt Activity Tolerance: Patient tolerated treatment well;Patient limited by fatigue Patient left: in bed;with call bell/phone within reach Nurse Communication: Mobility status PT Visit Diagnosis: Unsteadiness on feet (R26.81);Other abnormalities of gait and mobility (R26.89);Muscle weakness (generalized) (M62.81)     Time: 8101-7510 PT  Time Calculation (min) (ACUTE ONLY): 16 min  Charges:  $Therapeutic Activity: 8-22 mins                     1:06 PM, 04/24/22 Mearl Latin PT, DPT Physical Therapist at [facility]

## 2022-04-25 LAB — GLUCOSE, CAPILLARY
Glucose-Capillary: 87 mg/dL (ref 70–99)
Glucose-Capillary: 102 mg/dL — ABNORMAL HIGH (ref 70–99)
Glucose-Capillary: 96 mg/dL (ref 70–99)
Glucose-Capillary: 105 mg/dL — ABNORMAL HIGH (ref 70–99)

## 2022-04-25 NOTE — Progress Notes (Signed)
[facility] Progress Note  5 Days Post-Op  Subjective: Patient seen and examined.  She is resting comfortably in bed.  She had 2 bowel movements overnight and is tolerating her diet without nausea and vomiting.  She complains of continued lower abdominal pain.  She has only been receiving oral medications for the pain.  Objective: Vital signs in last 24 hours: Temp:  [97.9 F (36.6 C)-98.4 F (36.9 C)] 98.4 F (36.9 C) (01/20 0500) Pulse Rate:  [75-80] 79 (01/20 1015) Resp:  [16-18] 18 (01/20 0500) BP: (128-140)/(60-86) 130/86 (01/20 1015) SpO2:  [90 %-93 %] 93 % (01/19 2018) Last BM Date : 04/25/22  Intake/Output from previous day: 01/19 0701 - 01/20 0700 In: 720 [P.O.:720] Out: -  Intake/Output this shift: No intake/output data recorded.  General appearance: alert, cooperative, and no distress GI: Abdomen soft, nondistended, no percussion tenderness, mild periumbilical tenderness to palpation; no rigidity, guarding, rebound tenderness; incisions C/D/I with Dermabond in place, JP drain with minimal serous drainage in bulb  Lab Results:  No results for input(s): "WBC", "HGB", "HCT", "PLT" in the last 72 hours. BMET Recent Labs    04/23/22 0329  NA 133*  K 3.7  CL 100  CO2 27  GLUCOSE 112*  BUN 8  CREATININE 0.84  CALCIUM 8.9   PT/INR No results for input(s): "LABPROT", "INR" in the last 72 hours.  Studies/Results: No results found.  Anti-infectives: Anti-infectives (From admission, onward)    Start     Dose/Rate Route Frequency Ordered Stop   04/22/22 2200  amoxicillin-clavulanate (AUGMENTIN) 875-125 MG per tablet 1 tablet        1 tablet Oral Every 12 hours 04/22/22 1659     04/20/22 0200  piperacillin-tazobactam (ZOSYN) IVPB 3.375 g  Status:  Discontinued        3.375 g 100 mL/hr over 30 Minutes Intravenous Every 8 hours 04/19/22 1931 04/19/22 1937   04/20/22 0200  piperacillin-tazobactam (ZOSYN) IVPB 3.375 g  Status:  Discontinued         3.375 g 12.5 mL/hr over 240 Minutes Intravenous Every 8 hours 04/19/22 1938 04/22/22 1659   04/19/22 1830  piperacillin-tazobactam (ZOSYN) IVPB 3.375 g        3.375 g 100 mL/hr over 30 Minutes Intravenous  Once 04/19/22 1819 04/19/22 2206       Assessment/Plan:  Patient is a 72 year old female who is status post robotic laparoscopic assisted appendectomy on 1/15 for perforated appendicitis.  -Patient has had return of bowel function with 2 bowel movements overnight -Continue regular diet -PRN pain control and antiemetics -Patient would prefer to stay 1 more day to verify that her pain is continuing to improve.  Plan for discharge home tomorrow -Will plan to remove JP drain tomorrow prior to discharge   LOS: 6 days    [place] 04/25/2022

## 2022-04-25 NOTE — Progress Notes (Signed)
Patient rested well during this shift. Up to the bathroom x2 during this shift. BM noted. Pt c/o pain 7-8/10. PRN pain medications administered as ordered. No output from JP drain.

## 2022-04-25 NOTE — Progress Notes (Signed)
Patients oxygen saturation dropped to 87% at room air. Patient stated sh is border line sleep apnea, not required to wear CPAP. Placed patient on 2 liters, oxygen saturation 95% at 2 liters. MD Pappayliou made aware.

## 2022-04-25 NOTE — Progress Notes (Signed)
Patients alert and verbal. Continues to complain about pain to abdomen, prn given for pain, see MAR. JP drain intact and draining, emptied out 8 ml, light tan in color. Patients oxygen saturation decreased during shift, place patient on 2 liters. Patient lying in bed watching tv.

## 2022-04-26 LAB — GLUCOSE, CAPILLARY
Glucose-Capillary: 85 mg/dL (ref 70–99)
Glucose-Capillary: 116 mg/dL — ABNORMAL HIGH (ref 70–99)
Glucose-Capillary: 106 mg/dL — ABNORMAL HIGH (ref 70–99)

## 2022-04-26 MED ORDER — DOCUSATE SODIUM 100 MG PO CAPS: 100.0000 mg | ORAL_CAPSULE | Freq: Two times a day (BID) | ORAL | 2 refills | Status: DC

## 2022-04-26 MED ORDER — ACETAMINOPHEN 500 MG PO TABS: 1000.0000 mg | ORAL_TABLET | Freq: Four times a day (QID) | ORAL | 0 refills | Status: AC

## 2022-04-26 MED ORDER — OXYCODONE HCL 5 MG PO TABS: 5.0000 mg | ORAL_TABLET | Freq: Three times a day (TID) | ORAL | 0 refills | Status: DC | PRN

## 2022-04-26 NOTE — Progress Notes (Signed)
[facility] Progress Note  6 Days Post-Op  Subjective: Patient seen and examined.  She is resting comfortably in bed.  She states that her abdominal pain feels a little bit better today.  She is currently wearing 2 L nasal cannula.  She had a desaturation episode down to 87 while laying in bed yesterday.  She denies any shortness of breath or difficulty breathing.  She is tolerating a diet without nausea and vomiting, and she continues to pass flatus and have bowel movements.  JP drain with 8 cc of serous output yesterday.  Objective: Vital signs in last 24 hours: Temp:  [98.3 F (36.8 C)-99 F (37.2 C)] 98.3 F (36.8 C) (01/21 0612) Pulse Rate:  [69-73] 73 (01/21 0905) Resp:  [14-20] 14 (01/21 0612) BP: (127-142)/(68-69) 133/69 (01/21 0905) SpO2:  [88 %-97 %] 93 % (01/21 0612) Last BM Date : 04/26/22  Intake/Output from previous day: 01/20 0701 - 01/21 0700 In: -  Out: 8 [Drains:8] Intake/Output this shift: No intake/output data recorded.  General appearance: alert, cooperative, and no distress GI: Abdomen soft, nondistended, no percussion tenderness, nontender to palpation; no rigidity, guarding, rebound tenderness; incisions C/D/I with skin glue in place, JP drain in place with minimal serous output in the bulb  Lab Results:  No results for input(s): "WBC", "HGB", "HCT", "PLT" in the last 72 hours. BMET No results for input(s): "NA", "K", "CL", "CO2", "GLUCOSE", "BUN", "CREATININE", "CALCIUM" in the last 72 hours. PT/INR No results for input(s): "LABPROT", "INR" in the last 72 hours.  Studies/Results: No results found.  Anti-infectives: Anti-infectives (From admission, onward)    Start     Dose/Rate Route Frequency Ordered Stop   04/22/22 2200  amoxicillin-clavulanate (AUGMENTIN) 875-125 MG per tablet 1 tablet        1 tablet Oral Every 12 hours 04/22/22 1659     04/20/22 0200  piperacillin-tazobactam (ZOSYN) IVPB 3.375 g  Status:  Discontinued         3.375 g 100 mL/hr over 30 Minutes Intravenous Every 8 hours 04/19/22 1931 04/19/22 1937   04/20/22 0200  piperacillin-tazobactam (ZOSYN) IVPB 3.375 g  Status:  Discontinued        3.375 g 12.5 mL/hr over 240 Minutes Intravenous Every 8 hours 04/19/22 1938 04/22/22 1659   04/19/22 1830  piperacillin-tazobactam (ZOSYN) IVPB 3.375 g        3.375 g 100 mL/hr over 30 Minutes Intravenous  Once 04/19/22 1819 04/19/22 2206       Assessment/Plan:  Patient is 72 year old female who is status post robotic laparoscopic assisted appendectomy on 1/15 for perforated appendicitis.  -Patient doing well from a postoperative standpoint, moving her bowels, and adequate pain control -Will continue oral Augmentin while patient is inpatient -Continue regular diet -PRN pain control and antiemetics -JP drain removed today, in the event patient able to be discharged home later today -Requested nurse to attempt to wean patient off nasal cannula.  When patient ambulated to the restroom, she desaturated down to 77.  Returned back to the mid 90s once resting in bed -Home O2 eval ordered -Consultation also placed to social work in the event patient does require home oxygen   LOS: 7 days    Zaylin Runco A Ronn Smolinsky 04/26/2022

## 2022-04-26 NOTE — Discharge Instructions (Signed)
Surgery Discharge Instructions  Activity  You are advised to go directly home from the hospital.  Resume light activity. No heavy lifting over 10 lbs or strenuous exercise.  Fluids and Diet Regular diet  Medications  If you have not had a bowel movement in 24 hours, take 2 tablespoons over the counter Milk of mag.             You May resume your blood thinners tomorrow (Aspirin, coumadin, or other).  You are being discharged with prescriptions for Opioid/Narcotic Medications: There are some specific considerations for these medications that you should know. Opioid Meds have risks & benefits. Addiction to these meds is always a concern with prolonged use Take medication only as directed Do not drive while taking narcotic pain medication Do not crush tablets or capsules Do not use a different container than medication was dispensed in Lock the container of medication in a cool, dry place out of reach of children and pets. Opioid medication can cause addiction Do not share with anyone else (this is a felony) Do not store medications for future use. Dispose of them properly.     Disposal:  Find a [employer] household drug take back site near you.  If you can't get to a drug take back site, use the recipe below as a last resort to dispose of expired, unused or unwanted drugs. Disposal  (Do not dispose chemotherapy drugs this way, talk to your prescribing doctor instead.) Step 1: Mix drugs (do not crush) with dirt, kitty litter, or used coffee grounds and add a small amount of water to dissolve any solid medications. Step 2: Seal drugs in plastic bag. Step 3: Place plastic bag in trash. Step 4: Take prescription container and scratch out personal information, then recycle or throw away.  Operative Site  You have a liquid bandage over your incisions, this will begin to flake off in about a week. Ok to Games developer. Keep wound clean and dry. No baths or swimming. No lifting more than 10  pounds.  Contact Information: If you have questions or concerns, please call our office, [phone], Monday- Thursday 8AM-5PM and Friday 8AM-12Noon.  If it is after hours or on the weekend, please call [facility] Main Number, [phone], and ask to speak to the surgeon on call for Dr. Okey Dupre at [facility].   SPECIFIC COMPLICATIONS TO WATCH FOR: Inability to urinate Fever over 101? F by mouth Nausea and vomiting lasting longer than 24 hours. Pain not relieved by medication ordered Swelling around the operative site Increased redness, warmth, hardness, around operative area Numbness, tingling, or cold fingers or toes Blood -soaked dressing, (small amounts of oozing may be normal) Increasing and progressive drainage from surgical area or exam site

## 2022-04-26 NOTE — Progress Notes (Signed)
SATURATION QUALIFICATIONS: (This note is used to comply with regulatory documentation for home oxygen)  Patient Saturations on Room Air at Rest = 94%  Patient Saturations on Room Air while Ambulating = 86%  Patient Saturations on 2 Liters of oxygen while Ambulating = 98%  Please briefly explain why patient needs home oxygen: patient's o2 dropped to 86% while ambulating on RA. Patient did complain of SOB and had to take breaks.

## 2022-04-26 NOTE — Discharge Summary (Signed)
Physician Discharge Summary  Patient ID: Yesenia [name] MRN: [id number] DOB/AGE: 1950/06/17 72 y.o.  Admit date: 04/19/2022 Discharge date: 04/26/2022  Admission Diagnoses: Acute perforated appendicitis  Discharge Diagnoses:  Principal Problem:   Acute appendicitis with appendiceal abscess Active Problems:   Essential hypertension, benign   Hypothyroidism   Type II diabetes mellitus with neurological manifestations ([place])   Perforated appendicitis   Sepsis [facility])   Discharged Condition: stable  Hospital Course: Patient is a 72 year old female who was admitted with acute perforated appendicitis.  She underwent a robotic assisted laparoscopic appendectomy on 1/15, at which time she was noted to have perforated appendicitis with an associated abscess.  JP drain was left in place.  In the postoperative period, she initially had a postoperative ileus which is subsequently resolved.  She is now tolerating a diet without nausea and vomiting and moving her bowels without issue.  Her pain is adequately controlled on oral pain medications.  Her initial leukocytosis has since resolved.  She was noted to have increasing oxygen requirements, and home O2 eval demonstrates need for 2 L nasal cannula with exertion.  JP drain was removed prior to discharge.  She is stable for discharge at this time.  She will follow-up with Dr. Arnoldo Morale.  She will be discharged home with a prescription for narcotic pain medication, Tylenol, and Colace.  Consults: None  Significant Diagnostic Studies: radiology: CT scan: Acute appendicitis with possible perforation  Treatments: IV hydration, antibiotics: Augmentin, analgesia: Oxycodone, and surgery: Robotic assisted laparoscopic appendectomy  Discharge Exam: Blood pressure 133/65, pulse 76, temperature 99 F (37.2 C), temperature source Oral, resp. rate 15, height '5\' 2"'$  (1.575 m), weight 111.1 kg, SpO2 97 %. General appearance: alert, cooperative, and no distress GI:  Abdomen soft, nondistended, no percussion tenderness, minimal tenderness to palpation; no rigidity, guarding, rebound tenderness; incisions C/D/I with Dermabond in place  Disposition: Discharge disposition: 01-Home or Self Care       Discharge Instructions     Call MD for:  persistant nausea and vomiting   Complete by: As directed    Call MD for:  redness, tenderness, or signs of infection (pain, swelling, redness, odor or green/yellow discharge around incision site)   Complete by: As directed    Call MD for:  severe uncontrolled pain   Complete by: As directed    Call MD for:  temperature >100.4   Complete by: As directed    Diet - low sodium heart healthy   Complete by: As directed    For home use only DME oxygen   Complete by: As directed    Length of Need: 6 Months   Mode or (Route): Nasal cannula   Liters per Minute: 2   Frequency: Continuous (stationary and portable oxygen unit needed)   Oxygen delivery system: Gas   Increase activity slowly   Complete by: As directed       Allergies as of 04/26/2022       Reactions   Aspirin Other (See Comments)   G.I. Upset    Nalbuphine Itching, Swelling, Rash   Nubaine        Medication List     TAKE these medications    acetaminophen 500 MG tablet Commonly known as: TYLENOL Take 2 tablets (1,000 mg total) by mouth every 6 (six) hours for 7 days.   alendronate 70 MG tablet Commonly known as: FOSAMAX TAKE 1 TABLET BY MOUTH ONCE WEEKLY. TAKE WITH FULL GLASS OF WATER ON AN EMPTY STOMACH. What changed:  See the new instructions.   amitriptyline 10 MG tablet Commonly known as: ELAVIL Take 10 mg by mouth at bedtime.   amLODipine 2.5 MG tablet Commonly known as: NORVASC Take 2.5 mg by mouth daily.   Blood Glucose System Pak Kit Please dispense as Accu Chek Guide. USE TO TEST BLOOD SUGAR 3 TIMES DAILY. Dx: E11.65.   BLOOD GLUCOSE TEST STRIPS Strp Please dispense as Accu Chek Guide. USE TO TEST BLOOD SUGAR 3 TIMES  DAILY. Dx: E11.65   calcium carbonate 500 MG chewable tablet Commonly known as: Tums Chew 1 tablet (200 mg of elemental calcium total) by mouth 2 (two) times daily.   cholecalciferol 25 MCG (1000 UNIT) tablet Commonly known as: VITAMIN D3 TAKE (1) TABLET BY MOUTH ONCE DAILY. What changed: See the new instructions.   dexlansoprazole 60 MG capsule Commonly known as: DEXILANT TAKE 1 CAPSULE DAILY.   docusate sodium 100 MG capsule Commonly known as: Colace Take 1 capsule (100 mg total) by mouth 2 (two) times daily.   DULoxetine 30 MG capsule Commonly known as: CYMBALTA Take 30 mg by mouth every evening. Q PM  Take in addition to '60mg'$  Q AM   furosemide 40 MG tablet Commonly known as: LASIX TAKE (1) TABLET BY MOUTH ONCE DAILY AS NEEDED. What changed: See the new instructions.   Lancet Devices Misc Please dispense based on patient and insurance preference. Monitor FSBS 3x daily. Dx: E11.65   Lancets Misc Please dispense as Accu Chek Guide. USE TO TEST BLOOD SUGAR 3 TIMES DAILY. Dx: E11.65.   levothyroxine 150 MCG tablet Commonly known as: SYNTHROID TAKE (1) TABLET BY MOUTH ONCE DAILY BEFORE BREAKFAST. What changed: See the new instructions.   Linzess 290 MCG Caps capsule Generic drug: linaclotide TAKE 1 CAPSULE ONCE DAILY BEFORE BREAKFAST What changed: See the new instructions.   losartan 25 MG tablet Commonly known as: COZAAR Take 1 tablet by mouth daily.   metFORMIN 500 MG tablet Commonly known as: GLUCOPHAGE Take 500 mg by mouth daily.   metoprolol succinate 50 MG 24 hr tablet Commonly known as: TOPROL-XL Take 50 mg by mouth daily.   oxyCODONE 5 MG immediate release tablet Commonly known as: Roxicodone Take 1 tablet (5 mg total) by mouth every 8 (eight) hours as needed.               Durable Medical Equipment  (From admission, onward)           Start     Ordered   04/26/22 0000  For home use only DME oxygen       Question Answer Comment   Length of Need 6 Months   Mode or (Route) Nasal cannula   Liters per Minute 2   Frequency Continuous (stationary and portable oxygen unit needed)   Oxygen delivery system Gas      04/26/22 1507            Follow-up Information     [facility] Follow up.   Specialty: Home Health Services Why: PT will call to schedule your nest home visit. Contact information: [address] [place] [address] [phone]         [facility] Follow up.   Why: Home O2 set up through [place]. They will come to the home to deliver. Contact information: [place] [address] [phone]         Aviva Signs, MD. Call.   Specialty: General Surgery Why: Call  to schedule a follow up appointment this week Contact information: [address] [place] [address] [phone]                 Signed: Mikhia Dusek A [place] 04/26/2022, 3:08 PM

## 2022-04-26 NOTE — TOC Transition Note (Signed)
Transition of Care [facility]) - CM/SW Discharge Note   Patient Details  Name: Yesenia [name] MRN: [id number] Date of Birth: April 20, 1950  Transition of Care [facility]) CM/SW Contact:  Iona Beard, [place] Phone Number: 04/26/2022, 3:02 PM   Clinical Narrative:    CSW updated that pt is medically ready for D/C but will need home O2 set up. CSW spoke with pt who states she does not have a DME agency preference. CSW spoke to [place] with [employer] who accepts referral. CSW requested [facility] place DME O2 order. CSW updated Tommi Rumps with [facility] of plan for D/C today. HH orders have been placed. TOC signing off.   Final next level of care: Home w Home Health Services Barriers to Discharge: Barriers Resolved   Patient Goals and CMS Choice CMS Medicare.gov Compare Post Acute Care list provided to:: Patient Choice offered to / list presented to : Patient  Discharge Placement                         Discharge Plan and Services Additional resources added to the After Visit Summary for                  DME Arranged: Oxygen DME Agency: [employer] Date DME Agency Contacted: 04/26/22   Representative spoke with at DME Agency: [place]: PT [place]: [place] Date [place]: 04/26/22 Time [place]: [place] Representative spoke with at [place]: [place] ([place]) Interventions [place]: No Food Insecurity (04/20/2022)  Housing: Low Risk  (04/20/2022)  Transportation Needs: No Transportation Needs (04/20/2022)  Utilities: Not At Risk (04/20/2022)  Alcohol Screen: Low Risk  (05/22/2020)  Depression (PHQ2-9): Medium Risk (05/22/2020)  Financial Resource Strain: Low Risk  (02/21/2020)  Tobacco Use: Low Risk  (04/24/2022)     Readmission Risk Interventions    04/23/2022    2:39 PM  Readmission Risk Prevention Plan  Post Dischage Appt Not Complete  Medication Screening Complete  Transportation Screening  Complete

## 2022-04-27 ENCOUNTER — Other Ambulatory Visit: Payer: Self-pay | Admitting: Surgery

## 2022-04-27 DIAGNOSIS — K3533 Acute appendicitis with perforation and localized peritonitis, with abscess: Secondary | ICD-10-CM

## 2022-04-27 MED ORDER — OXYCODONE HCL 5 MG PO TABS: 5.0000 mg | ORAL_TABLET | Freq: Three times a day (TID) | ORAL | 0 refills | Status: DC | PRN

## 2022-04-30 ENCOUNTER — Encounter: Payer: Self-pay | Admitting: General Surgery

## 2022-04-30 ENCOUNTER — Ambulatory Visit (INDEPENDENT_AMBULATORY_CARE_PROVIDER_SITE_OTHER): Payer: Medicare HMO | Admitting: General Surgery

## 2022-04-30 VITALS — BP 123/81 | HR 64 | Temp 98.8°F | Resp 20 | Ht 62.0 in | Wt 261.0 lb

## 2022-04-30 DIAGNOSIS — Z09 Encounter for follow-up examination after completed treatment for conditions other than malignant neoplasm: Secondary | ICD-10-CM

## 2022-04-30 MED ORDER — ONDANSETRON HCL 4 MG PO TABS: 4.0000 mg | ORAL_TABLET | Freq: Three times a day (TID) | ORAL | 1 refills | Status: DC | PRN

## 2022-04-30 NOTE — Progress Notes (Signed)
Subjective:     Yesenia [name]  Here for postoperative visit, status post robotic assisted laparoscopic appendectomy for perforated appendicitis.  Patient went home 5 days ago.  She did go home on home oxygen.  She does not have a primary care physician at the present time.  She still feels weak.  She denies any fever or chills.  Her bowel movements have been normal.  She denies any vomiting. Objective:    BP 123/81   Pulse 64   Temp 98.8 F (37.1 C) (Oral)   Resp 20   Ht '5\' 2"'$  (1.575 m)   Wt 261 lb (118.4 kg)   SpO2 96%   BMI 47.74 kg/m   General:  alert, cooperative, and no distress  Abdomen is soft, incisions healing well. Final pathology consistent with diagnosis.     Assessment:    Patient is progressing well given her diagnosis of perforated appendicitis.  She has been taking the oxycodone regularly and I told her to stop this as this may be causing her nausea.    Plan:   Zofran has been prescribed for her nausea.  I will see her in 2 weeks for follow-up.  She is attempting to find a primary care physician.

## 2022-05-04 ENCOUNTER — Other Ambulatory Visit: Payer: Self-pay

## 2022-05-04 ENCOUNTER — Inpatient Hospital Stay (HOSPITAL_COMMUNITY)
Admission: EM | Admit: 2022-05-04 | Discharge: 2022-05-09 | DRG: 862 | Disposition: A | Discharge status: Home / Self Care | Payer: Medicare HMO | Attending: General Surgery | Admitting: General Surgery

## 2022-05-04 ENCOUNTER — Encounter (HOSPITAL_COMMUNITY): Payer: Self-pay | Admitting: *Deleted

## 2022-05-04 ENCOUNTER — Emergency Department (HOSPITAL_COMMUNITY): Payer: Medicare HMO

## 2022-05-04 DIAGNOSIS — M810 Age-related osteoporosis without current pathological fracture: Secondary | ICD-10-CM | POA: Diagnosis present

## 2022-05-04 DIAGNOSIS — E8809 Other disorders of plasma-protein metabolism, not elsewhere classified: Secondary | ICD-10-CM | POA: Diagnosis not present

## 2022-05-04 DIAGNOSIS — R197 Diarrhea, unspecified: Secondary | ICD-10-CM | POA: Diagnosis not present

## 2022-05-04 DIAGNOSIS — I10 Essential (primary) hypertension: Secondary | ICD-10-CM | POA: Diagnosis not present

## 2022-05-04 DIAGNOSIS — E440 Moderate protein-calorie malnutrition: Secondary | ICD-10-CM | POA: Diagnosis present

## 2022-05-04 DIAGNOSIS — Z801 Family history of malignant neoplasm of trachea, bronchus and lung: Secondary | ICD-10-CM

## 2022-05-04 DIAGNOSIS — Y838 Other surgical procedures as the cause of abnormal reaction of the patient, or of later complication, without mention of misadventure at the time of the procedure: Secondary | ICD-10-CM | POA: Diagnosis present

## 2022-05-04 DIAGNOSIS — R1084 Generalized abdominal pain: Secondary | ICD-10-CM

## 2022-05-04 DIAGNOSIS — Z821 Family history of blindness and visual loss: Secondary | ICD-10-CM

## 2022-05-04 DIAGNOSIS — Z86711 Personal history of pulmonary embolism: Secondary | ICD-10-CM

## 2022-05-04 DIAGNOSIS — Z8262 Family history of osteoporosis: Secondary | ICD-10-CM | POA: Diagnosis not present

## 2022-05-04 DIAGNOSIS — Z90710 Acquired absence of both cervix and uterus: Secondary | ICD-10-CM | POA: Diagnosis not present

## 2022-05-04 DIAGNOSIS — T8143XA Infection following a procedure, organ and space surgical site, initial encounter: Principal | ICD-10-CM | POA: Diagnosis present

## 2022-05-04 DIAGNOSIS — R5381 Other malaise: Secondary | ICD-10-CM | POA: Diagnosis present

## 2022-05-04 DIAGNOSIS — Z825 Family history of asthma and other chronic lower respiratory diseases: Secondary | ICD-10-CM

## 2022-05-04 DIAGNOSIS — Z8249 Family history of ischemic heart disease and other diseases of the circulatory system: Secondary | ICD-10-CM | POA: Diagnosis not present

## 2022-05-04 DIAGNOSIS — E66813 Obesity, class 3: Secondary | ICD-10-CM | POA: Diagnosis present

## 2022-05-04 DIAGNOSIS — F419 Anxiety disorder, unspecified: Secondary | ICD-10-CM | POA: Diagnosis present

## 2022-05-04 DIAGNOSIS — K219 Gastro-esophageal reflux disease without esophagitis: Secondary | ICD-10-CM | POA: Diagnosis present

## 2022-05-04 DIAGNOSIS — Z87442 Personal history of urinary calculi: Secondary | ICD-10-CM

## 2022-05-04 DIAGNOSIS — Z6841 Body Mass Index (BMI) 40.0 and over, adult: Secondary | ICD-10-CM | POA: Diagnosis not present

## 2022-05-04 DIAGNOSIS — R109 Unspecified abdominal pain: Secondary | ICD-10-CM | POA: Diagnosis present

## 2022-05-04 DIAGNOSIS — E1149 Type 2 diabetes mellitus with other diabetic neurological complication: Secondary | ICD-10-CM | POA: Diagnosis not present

## 2022-05-04 DIAGNOSIS — E876 Hypokalemia: Secondary | ICD-10-CM | POA: Diagnosis present

## 2022-05-04 DIAGNOSIS — E039 Hypothyroidism, unspecified: Secondary | ICD-10-CM | POA: Diagnosis not present

## 2022-05-04 DIAGNOSIS — Z800 Family history of malignant neoplasm of digestive organs: Secondary | ICD-10-CM | POA: Diagnosis not present

## 2022-05-04 DIAGNOSIS — Z818 Family history of other mental and behavioral disorders: Secondary | ICD-10-CM

## 2022-05-04 DIAGNOSIS — D649 Anemia, unspecified: Secondary | ICD-10-CM | POA: Diagnosis present

## 2022-05-04 DIAGNOSIS — Z9089 Acquired absence of other organs: Secondary | ICD-10-CM

## 2022-05-04 DIAGNOSIS — E46 Unspecified protein-calorie malnutrition: Secondary | ICD-10-CM | POA: Insufficient documentation

## 2022-05-04 DIAGNOSIS — M797 Fibromyalgia: Secondary | ICD-10-CM | POA: Diagnosis present

## 2022-05-04 DIAGNOSIS — K56609 Unspecified intestinal obstruction, unspecified as to partial versus complete obstruction: Secondary | ICD-10-CM | POA: Diagnosis not present

## 2022-05-04 DIAGNOSIS — H409 Unspecified glaucoma: Secondary | ICD-10-CM | POA: Diagnosis present

## 2022-05-04 DIAGNOSIS — K651 Peritoneal abscess: Secondary | ICD-10-CM | POA: Diagnosis not present

## 2022-05-04 DIAGNOSIS — L02211 Cutaneous abscess of abdominal wall: Secondary | ICD-10-CM | POA: Diagnosis not present

## 2022-05-04 LAB — CBC WITH DIFFERENTIAL/PLATELET
Abs Immature Granulocytes: 0.1 10*3/uL — ABNORMAL HIGH (ref 0.00–0.07)
Basophils Absolute: 0.1 10*3/uL (ref 0.0–0.1)
Basophils Relative: 0 %
Eosinophils Absolute: 0.1 10*3/uL (ref 0.0–0.5)
Eosinophils Relative: 0 %
HCT: 37.3 % (ref 36.0–46.0)
Hemoglobin: 10.9 g/dL — ABNORMAL LOW (ref 12.0–15.0)
Immature Granulocytes: 1 %
Lymphocytes Relative: 9 %
Lymphs Abs: 1.3 10*3/uL (ref 0.7–4.0)
MCH: 24.3 pg — ABNORMAL LOW (ref 26.0–34.0)
MCHC: 29.2 g/dL — ABNORMAL LOW (ref 30.0–36.0)
MCV: 83.3 fL (ref 80.0–100.0)
Monocytes Absolute: 1.2 10*3/uL — ABNORMAL HIGH (ref 0.1–1.0)
Monocytes Relative: 9 %
Neutro Abs: 11.6 10*3/uL — ABNORMAL HIGH (ref 1.7–7.7)
Neutrophils Relative %: 81 %
Platelets: 615 10*3/uL — ABNORMAL HIGH (ref 150–400)
RBC: 4.48 MIL/uL (ref 3.87–5.11)
RDW: 17.6 % — ABNORMAL HIGH (ref 11.5–15.5)
WBC: 14.4 10*3/uL — ABNORMAL HIGH (ref 4.0–10.5)
nRBC: 0 % (ref 0.0–0.2)

## 2022-05-04 LAB — URINALYSIS, ROUTINE W REFLEX MICROSCOPIC
Bilirubin Urine: NEGATIVE
Glucose, UA: NEGATIVE mg/dL
Hgb urine dipstick: NEGATIVE
Ketones, ur: 5 mg/dL — AB
Leukocytes,Ua: NEGATIVE
Nitrite: NEGATIVE
Protein, ur: 30 mg/dL — AB
Specific Gravity, Urine: 1.032 — ABNORMAL HIGH (ref 1.005–1.030)
pH: 5 (ref 5.0–8.0)

## 2022-05-04 LAB — COMPREHENSIVE METABOLIC PANEL
ALT: 14 U/L (ref 0–44)
AST: 19 U/L (ref 15–41)
Albumin: 2.9 g/dL — ABNORMAL LOW (ref 3.5–5.0)
Alkaline Phosphatase: 87 U/L (ref 38–126)
Anion gap: 12 (ref 5–15)
BUN: 9 mg/dL (ref 8–23)
CO2: 24 mmol/L (ref 22–32)
Calcium: 9.3 mg/dL (ref 8.9–10.3)
Chloride: 101 mmol/L (ref 98–111)
Creatinine, Ser: 0.87 mg/dL (ref 0.44–1.00)
GFR, Estimated: >60 mL/min (ref >60)
Glucose, Bld: 117 mg/dL — ABNORMAL HIGH (ref 70–99)
Potassium: 3.4 mmol/L — ABNORMAL LOW (ref 3.5–5.1)
Sodium: 137 mmol/L (ref 135–145)
Total Bilirubin: 0.6 mg/dL (ref 0.3–1.2)
Total Protein: 7.4 g/dL (ref 6.5–8.1)

## 2022-05-04 LAB — LIPASE, BLOOD: Lipase: 27 U/L (ref 11–51)

## 2022-05-04 MED ORDER — PIPERACILLIN-TAZOBACTAM 3.375 G IVPB 30 MIN
3.3750 g | Freq: Once | INTRAVENOUS | Status: AC
  Administered 2022-05-04: 3.375 g via INTRAVENOUS
  Filled 2022-05-04: qty 50

## 2022-05-04 MED ORDER — ONDANSETRON 4 MG PO TBDP
4.0000 mg | ORAL_TABLET | Freq: Once | ORAL | Status: AC
  Administered 2022-05-04: 4 mg via ORAL
  Filled 2022-05-04: qty 1

## 2022-05-04 NOTE — ED Notes (Signed)
Attempted x 1 unsucc iv by one RN. Unsucc x 2 by another rn. One USGIV attempt and vessel blew to right ac. EDP aware.

## 2022-05-04 NOTE — ED Triage Notes (Signed)
Pt had her appendix removed almost 2 weeks ago but pt still having n/v and radiating pain in abdomen

## 2022-05-04 NOTE — Progress Notes (Signed)
Aware of patient's readmission.  S/p laparoscopic appendectomy for perforated appendicitis.  An appendolith was removed at that time, final pathology confirmed removal of appendix.  Postoperative drain removed upon discharge.  Will get IR consulted tomorrow for placement of drain.  Patient is a difficult IV, so may need PICC placement tomorrow.

## 2022-05-04 NOTE — ED Provider Notes (Signed)
[facility] Provider Note   CSN: [id number] Arrival date & time: 05/04/22  1115     History  Chief Complaint  Patient presents with   Emesis    Yesenia [name] is a 72 y.o. female.   Emesis Patient presents with abdominal pain nausea vomiting.  About 2 weeks post appendectomy.  Did have an abscess at the beginning.  Reportedly having some fevers.  Had Zofran without relief.  Has been seen by surgeon and stopped her pain medicine.  Continued pain.  Decreased oral intake.  Pain is in the upper abdomen or the pain with the operation was in the lower abdomen.    Past Medical History:  Diagnosis Date   Anxiety    Carpal tunnel syndrome    Complication of anesthesia    slow waking up from anesthesia per pt   DDD (degenerative disc disease), lumbar    DVT (deep venous thrombosis) ([place]) 10/17/2012   Essential hypertension    Facet arthritis of lumbar region    Fatty liver    Fibromyalgia    GERD (gastroesophageal reflux disease)    Glaucoma    H/O hiatal hernia    Hypothyroidism    Hypothyroidism    Kidney stones    Osteoporosis    PE (pulmonary embolism) 10/17/2012   Pneumonia    2002   S/P cardiac cath [id number]   Normal per report   Status post placement of implantable loop recorder    Removed 2004   Type 2 diabetes mellitus ([place])     Home Medications Prior to Admission medications   Medication Sig Start Date End Date Taking? Authorizing Provider  alendronate (FOSAMAX) 70 MG tablet TAKE 1 TABLET BY MOUTH ONCE WEEKLY. TAKE WITH FULL GLASS OF WATER ON AN EMPTY STOMACH. Patient taking differently: Take 70 mg by mouth once a week. TAKE 1 TABLET BY MOUTH ONCE WEEKLY. TAKE WITH FULL GLASS OF WATER ON AN EMPTY STOMACH. 12/13/19   [name], MD  amitriptyline (ELAVIL) 10 MG tablet Take 10 mg by mouth at bedtime.    [provider]  amLODipine (NORVASC) 2.5 MG tablet Take 2.5 mg by mouth daily.    [provider]  Blood Glucose Monitoring Suppl (BLOOD GLUCOSE SYSTEM PAK) KIT Please dispense as Accu Chek Guide. USE TO TEST BLOOD SUGAR 3 TIMES DAILY. Dx: E11.65. 01/23/20   [name], MD  calcium carbonate (TUMS) 500 MG chewable tablet Chew 1 tablet (200 mg of elemental calcium total) by mouth 2 (two) times daily. 08/09/20   Roxan Hockey, MD  cholecalciferol (VITAMIN D) 25 MCG (1000 UNIT) tablet TAKE (1) TABLET BY MOUTH ONCE DAILY. Patient taking differently: Take 1,000 Units by mouth daily. 06/24/20   [name], MD  dexlansoprazole (DEXILANT) 60 MG capsule TAKE 1 CAPSULE DAILY. 03/18/22   Rourk, Cristopher Estimable, MD  docusate sodium (COLACE) 100 MG capsule Take 1 capsule (100 mg total) by mouth 2 (two) times daily. 04/26/22 04/26/23  Pappayliou, Barnetta Chapel A, DO  DULoxetine (CYMBALTA) 30 MG capsule Take 30 mg by mouth every evening. Q PM  Take in addition to '60mg'$  Q AM    [provider]  furosemide (LASIX) 40 MG tablet TAKE (1) TABLET BY MOUTH ONCE DAILY AS NEEDED. Patient taking differently: Take 40 mg by mouth daily as needed for edema. 06/24/20   [name], MD  Glucose Blood (BLOOD GLUCOSE TEST STRIPS) STRP Please dispense as Accu Chek Guide.  USE TO TEST BLOOD SUGAR 3 TIMES DAILY. Dx: E11.65 01/23/20   [name], MD  Lancet Devices MISC Please dispense based on patient and insurance preference. Monitor FSBS 3x daily. Dx: E11.65 09/13/17   [name], MD  Lancets MISC Please dispense as Accu Chek Guide. USE TO TEST BLOOD SUGAR 3 TIMES DAILY. Dx: E11.65. 01/23/20   [name], MD  levothyroxine (SYNTHROID) 150 MCG tablet TAKE (1) TABLET BY MOUTH ONCE DAILY BEFORE BREAKFAST. Patient taking differently: Take 150 mcg by mouth daily before breakfast. 04/03/20   [name], MD  linaclotide [facility]) 290 MCG CAPS capsule TAKE 1 CAPSULE ONCE DAILY BEFORE BREAKFAST Patient taking differently: Take 290 mcg by mouth daily before breakfast. 02/18/22    [name], NP  losartan (COZAAR) 25 MG tablet Take 1 tablet by mouth daily. 12/20/21   [provider]  metFORMIN (GLUCOPHAGE) 500 MG tablet Take 500 mg by mouth daily.    [provider]  metoprolol succinate (TOPROL-XL) 50 MG 24 hr tablet Take 50 mg by mouth daily.    [provider]  ondansetron (ZOFRAN) 4 MG tablet Take 1 tablet (4 mg total) by mouth every 8 (eight) hours as needed for nausea or vomiting. 04/30/22   Aviva Signs, MD  oxyCODONE (ROXICODONE) 5 MG immediate release tablet Take 1 tablet (5 mg total) by mouth every 8 (eight) hours as needed. 04/27/22 04/27/23  Pappayliou, Barnetta Chapel A, DO      Allergies    Aspirin and Nalbuphine    Review of Systems   Review of Systems  Gastrointestinal:  Positive for vomiting.    Physical Exam Updated Vital Signs BP 124/74 (BP Location: Right Wrist)   Pulse 89   Temp 99.1 F (37.3 C) (Oral)   Resp (!) 21   Ht '5\' 2"'$  (1.575 m)   Wt 120.2 kg   SpO2 100%   BMI 48.47 kg/m  Physical Exam Vitals reviewed.  HENT:     Head:     Comments: Nasal cannula oxygen. Cardiovascular:     Rate and Rhythm: Regular rhythm.  Abdominal:     Tenderness: There is abdominal tenderness.     Comments: Upper abdominal tenderness with some fullness.  No hernias palpated.  Skin:    General: Skin is warm.     Capillary Refill: Capillary refill takes less than 2 seconds.  Neurological:     Mental Status: She is alert and oriented to person, place, and time.     ED Results / Procedures / Treatments   Labs (all labs ordered are listed, but only abnormal results are displayed) Labs Reviewed  CBC WITH DIFFERENTIAL/PLATELET - Abnormal; Notable for the following components:      Result Value   WBC 14.4 (*)    Hemoglobin 10.9 (*)    MCH 24.3 (*)    MCHC 29.2 (*)    RDW 17.6 (*)    Platelets 615 (*)    Neutro Abs 11.6 (*)    Monocytes Absolute 1.2 (*)    Abs Immature Granulocytes 0.10 (*)    All other components within  normal limits  COMPREHENSIVE METABOLIC PANEL - Abnormal; Notable for the following components:   Potassium 3.4 (*)    Glucose, Bld 117 (*)    Albumin 2.9 (*)    All other components within normal limits  LIPASE, BLOOD  URINALYSIS, ROUTINE W REFLEX MICROSCOPIC    EKG None  Radiology CT ABDOMEN PELVIS WO CONTRAST  Result Date:  05/04/2022 CLINICAL DATA:  Status post appendectomy 2 weeks ago still having nausea, vomiting, and radiating pain in the abdomen EXAM: CT ABDOMEN AND PELVIS WITHOUT CONTRAST TECHNIQUE: Multidetector CT imaging of the abdomen and pelvis was performed following the standard protocol without IV contrast. RADIATION DOSE REDUCTION: This exam was performed according to the departmental dose-optimization program which includes automated exposure control, adjustment of the mA and/or kV according to patient size and/or use of iterative reconstruction technique. COMPARISON:  CT abdomen and pelvis 04/19/2022 FINDINGS: Lower chest: No acute abnormality. Hepatobiliary: No focal liver abnormality is seen. Status post cholecystectomy. No biliary dilatation. Pancreas: Fatty atrophy.  No acute abnormality. Spleen: Unremarkable. Adrenals/Urinary Tract: Unchanged right adrenal myelolipoma measuring 4.0 cm, no follow-up recommended. No urinary calculi or hydronephrosis. Unremarkable bladder. Low-density right renal cyst does not require additional follow-up. Stomach/Bowel: Moderate hiatal hernia. Normal caliber large and small bowel. Colonic diverticulosis without diverticulitis. Postoperative change of appendectomy since 04/09/2022. Small fluid collection with adjacent stranding and containing a punctate calcification within the appendectomy bed. This measures approximately 3.5 x 2.3 x 1.5 cm (2/53 and 5/49). This is more organized and similar in size since 04/19/2022. This is intimately associated with the adjacent ileum and fistulous connection is difficult to exclude. Adjacent mesenteric fat  stranding and edema. Vascular/Lymphatic: Aortic atherosclerosis. Subcentimeter right ileocolic nodes are likely reactive. Reproductive: Hysterectomy. Other: No free intraperitoneal air. Musculoskeletal: L4-L5 posterior fusion. No acute osseous abnormality. IMPRESSION: Since CT 04/19/2022, interval postoperative change of appendectomy. Redemonstrated inflammatory mass/abscess abutting the terminal ileum and cecum measuring up to 3.5 cm and containing an appendicolith. This abuts the terminal ileum and fistulous connection is difficult to exclude. Moderate hiatal hernia. Electronically Signed   By: Placido Sou M.D.   On: 05/04/2022 19:27    Procedures Procedures    Medications Ordered in ED Medications  piperacillin-tazobactam (ZOSYN) IVPB 3.375 g (has no administration in time range)  ondansetron (ZOFRAN-ODT) disintegrating tablet 4 mg (4 mg Oral Given 05/04/22 1432)    ED Course/ Medical Decision Making/ A&P                             Medical Decision Making Amount and/or Complexity of Data Reviewed Radiology: ordered.  Risk Prescription drug management.   Patient abdominal pain nausea vomiting post appendectomy.  Did have abscess.  White count mildly increased from prior.  Will get CT scan to further evaluate for causes such as ileus, constipation, abscess.  White count is mildly elevated.  CT scan independently turbid and does show recurrent or persistent abscess.  Discussed with Dr. Arnoldo Morale from general surgery.  States that patient should be admitted to internal medicine.  Will round tomorrow.  Do not think that patient will have a new surgery.  Will need IR drain placed.  Zosyn for her antibiotics.        Final Clinical Impression(s) / ED Diagnoses Final diagnoses:  Postprocedural intraabdominal abscess    Rx / DC Orders ED Discharge Orders     None         Davonna Belling, MD 05/04/22 2034
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
## 2022-05-04 NOTE — ED Provider Triage Note (Signed)
Emergency Medicine Provider Triage Evaluation Note  Yesenia [name] , a 72 y.o. female  was evaluated in triage.  Pt complains of abdominal pain and uncontrolled nausea and vomiting.  She is currently 2 weeks out from an appendectomy under the care of Dr. Arnoldo Morale at which time she had a perforated appendix.  She has had subjective fevers as well.  She is taking Zofran which is not relieving her nausea symptom..  Review of Systems  Positive: Abdominal pain, nausea and vomiting, subjective fever Negative: Bowel changes, dizziness  Physical Exam  BP (!) 142/77 (BP Location: Left Arm)   Pulse (!) 102   Temp 98.8 F (37.1 C) (Oral)   Resp 20   Ht '5\' 2"'$  (1.575 m)   Wt 120.2 kg   SpO2 96%   BMI 48.47 kg/m  Gen:   Awake, no distress   Resp:  Normal effort  MSK:   Moves extremities without difficulty  Other:    Medical Decision Making  Medically screening exam initiated at 3:13 PM.  Appropriate orders placed.  Yesenia [name] was informed that the remainder of the evaluation will be completed by another provider, this initial triage assessment does not replace that evaluation, and the importance of remaining in the ED until their evaluation is complete.  Labs and imaging ordered.   Yesenia Jefferson, PA-C 05/04/22 1515

## 2022-05-04 NOTE — ED Notes (Signed)
Pt difficult stick. Still attempting iv access for ct scan

## 2022-05-04 NOTE — ED Notes (Signed)
Attempted to call report to the floor, bed is not approved.

## 2022-05-05 DIAGNOSIS — E46 Unspecified protein-calorie malnutrition: Secondary | ICD-10-CM | POA: Insufficient documentation

## 2022-05-05 DIAGNOSIS — R1084 Generalized abdominal pain: Secondary | ICD-10-CM | POA: Diagnosis not present

## 2022-05-05 LAB — PHOSPHORUS: Phosphorus: 2.9 mg/dL (ref 2.5–4.6)

## 2022-05-05 LAB — MAGNESIUM: Magnesium: 1.7 mg/dL (ref 1.7–2.4)

## 2022-05-05 LAB — COMPREHENSIVE METABOLIC PANEL
ALT: 12 U/L (ref 0–44)
AST: 17 U/L (ref 15–41)
Albumin: 2.3 g/dL — ABNORMAL LOW (ref 3.5–5.0)
Alkaline Phosphatase: 73 U/L (ref 38–126)
Anion gap: 7 (ref 5–15)
BUN: 13 mg/dL (ref 8–23)
CO2: 27 mmol/L (ref 22–32)
Calcium: 8.6 mg/dL — ABNORMAL LOW (ref 8.9–10.3)
Chloride: 102 mmol/L (ref 98–111)
Creatinine, Ser: 0.95 mg/dL (ref 0.44–1.00)
GFR, Estimated: >60 mL/min (ref >60)
Glucose, Bld: 101 mg/dL — ABNORMAL HIGH (ref 70–99)
Potassium: 3.4 mmol/L — ABNORMAL LOW (ref 3.5–5.1)
Sodium: 136 mmol/L (ref 135–145)
Total Bilirubin: 0.8 mg/dL (ref 0.3–1.2)
Total Protein: 5.8 g/dL — ABNORMAL LOW (ref 6.5–8.1)

## 2022-05-05 LAB — CBC
HCT: 29.6 % — ABNORMAL LOW (ref 36.0–46.0)
Hemoglobin: 8.8 g/dL — ABNORMAL LOW (ref 12.0–15.0)
MCH: 24.7 pg — ABNORMAL LOW (ref 26.0–34.0)
MCHC: 29.7 g/dL — ABNORMAL LOW (ref 30.0–36.0)
MCV: 83.1 fL (ref 80.0–100.0)
Platelets: 492 10*3/uL — ABNORMAL HIGH (ref 150–400)
RBC: 3.56 MIL/uL — ABNORMAL LOW (ref 3.87–5.11)
RDW: 17.3 % — ABNORMAL HIGH (ref 11.5–15.5)
WBC: 11.3 10*3/uL — ABNORMAL HIGH (ref 4.0–10.5)
nRBC: 0 % (ref 0.0–0.2)

## 2022-05-05 LAB — GLUCOSE, CAPILLARY
Glucose-Capillary: 98 mg/dL (ref 70–99)
Glucose-Capillary: 100 mg/dL — ABNORMAL HIGH (ref 70–99)
Glucose-Capillary: 129 mg/dL — ABNORMAL HIGH (ref 70–99)
Glucose-Capillary: 127 mg/dL — ABNORMAL HIGH (ref 70–99)

## 2022-05-05 LAB — PROTIME-INR
INR: 1.1 (ref 0.8–1.2)
Prothrombin Time: 14.5 seconds (ref 11.4–15.2)

## 2022-05-05 MED ORDER — METOPROLOL SUCCINATE ER 50 MG PO TB24
50.0000 mg | ORAL_TABLET | Freq: Every day | ORAL | Status: DC
  Administered 2022-05-05 – 2022-05-09 (×5): 50 mg via ORAL
  Filled 2022-05-05 (×5): qty 1

## 2022-05-05 MED ORDER — POTASSIUM CHLORIDE CRYS ER 20 MEQ PO TBCR
40.0000 meq | EXTENDED_RELEASE_TABLET | Freq: Once | ORAL | Status: AC
  Administered 2022-05-05: 40 meq via ORAL
  Filled 2022-05-05: qty 2

## 2022-05-05 MED ORDER — LOSARTAN POTASSIUM 50 MG PO TABS
25.0000 mg | ORAL_TABLET | Freq: Every day | ORAL | Status: DC
  Administered 2022-05-05 – 2022-05-09 (×5): 25 mg via ORAL
  Filled 2022-05-05 (×5): qty 1

## 2022-05-05 MED ORDER — PIPERACILLIN-TAZOBACTAM 3.375 G IVPB
3.3750 g | Freq: Three times a day (TID) | INTRAVENOUS | Status: DC
  Administered 2022-05-05 – 2022-05-09 (×13): 3.375 g via INTRAVENOUS
  Filled 2022-05-05 (×13): qty 50

## 2022-05-05 MED ORDER — INSULIN ASPART 100 UNIT/ML IJ SOLN
0.0000 [IU] | Freq: Three times a day (TID) | INTRAMUSCULAR | Status: DC
  Administered 2022-05-05 – 2022-05-08 (×6): 2 [IU] via SUBCUTANEOUS

## 2022-05-05 MED ORDER — ACETAMINOPHEN 650 MG RE SUPP: 650.0000 mg | Freq: Four times a day (QID) | RECTAL | Status: DC | PRN

## 2022-05-05 MED ORDER — ONDANSETRON HCL 4 MG PO TABS: 4.0000 mg | ORAL_TABLET | Freq: Four times a day (QID) | ORAL | Status: DC | PRN

## 2022-05-05 MED ORDER — ONDANSETRON HCL 4 MG/2ML IJ SOLN
4.0000 mg | Freq: Four times a day (QID) | INTRAMUSCULAR | Status: DC | PRN
  Administered 2022-05-05 – 2022-05-09 (×11): 4 mg via INTRAVENOUS
  Filled 2022-05-05 (×11): qty 2

## 2022-05-05 MED ORDER — LEVOTHYROXINE SODIUM 75 MCG PO TABS
150.0000 ug | ORAL_TABLET | Freq: Every day | ORAL | Status: DC
  Administered 2022-05-05 – 2022-05-09 (×5): 150 ug via ORAL
  Filled 2022-05-05 (×5): qty 2

## 2022-05-05 MED ORDER — AMLODIPINE BESYLATE 5 MG PO TABS
2.5000 mg | ORAL_TABLET | Freq: Every day | ORAL | Status: DC
  Administered 2022-05-05 – 2022-05-09 (×5): 2.5 mg via ORAL
  Filled 2022-05-05 (×5): qty 1

## 2022-05-05 MED ORDER — PANTOPRAZOLE SODIUM 40 MG PO TBEC
40.0000 mg | DELAYED_RELEASE_TABLET | Freq: Every day | ORAL | Status: DC
  Administered 2022-05-05 – 2022-05-08 (×4): 40 mg via ORAL
  Filled 2022-05-05 (×4): qty 1

## 2022-05-05 MED ORDER — GLUCERNA SHAKE PO LIQD
237.0000 mL | Freq: Three times a day (TID) | ORAL | Status: DC
  Administered 2022-05-08 – 2022-05-09 (×2): 237 mL via ORAL

## 2022-05-05 MED ORDER — ACETAMINOPHEN 325 MG PO TABS
650.0000 mg | ORAL_TABLET | Freq: Four times a day (QID) | ORAL | Status: DC | PRN
  Administered 2022-05-06: 650 mg via ORAL
  Filled 2022-05-05: qty 2

## 2022-05-05 MED ORDER — SODIUM CHLORIDE 0.9 % IV SOLN: INTRAVENOUS | Status: AC

## 2022-05-05 MED ORDER — HYDROMORPHONE HCL 1 MG/ML IJ SOLN
0.5000 mg | INTRAMUSCULAR | Status: DC | PRN
  Administered 2022-05-05 – 2022-05-09 (×25): 0.5 mg via INTRAVENOUS
  Filled 2022-05-05 (×25): qty 0.5

## 2022-05-05 NOTE — Progress Notes (Signed)
PROGRESS NOTE    Yesenia [name]  BDZ:[id number] DOB: 12-Apr-1950 DOA: 05/04/2022 PCP: Celene Squibb, MD   Brief Narrative:    Yesenia [name] is a 72 y.o. female with medical history significant of hypertension, GERD, T2DM, hypothyroidism, N who presents to the emergency department due to abdominal pain, nausea, vomiting which started about 2 weeks post appendectomy.  Patient had a robotic laparoscopic assisted appendectomy on 04/20/2022 due to acute appendicitis with perforation.  She was discharged on 1/21 and was prescribed with oxycodone.  Being managed by general surgery with initial recommended admission for IR to place drain placement, but CT appears to be a small phlegmon and IR recommending observation.  Assessment & Plan:   Principal Problem:   Abdominal pain Active Problems:   Essential hypertension   Acquired hypothyroidism   Obesity, Class III, BMI 40-49.9 (morbid obesity) (HCC)   Type II diabetes mellitus with neurological manifestations (HCC)   Hypokalemia   Hypoalbuminemia due to protein-calorie malnutrition (HCC)  Assessment and Plan:   Abdominal pain s/p laparoscopic appendectomy for perforated appendicitis Continue IV NS at 100 mLs/Hr Continue IV Zosyn  Continue IV Dilaudid 0.5 mg q.3h p.r.n. for moderate to severe pain Continue IV Zofran p.r.n. Further recommendations per general surgery, IR recommending observation with no drain placement   Essential hypertension Continue amlodipine, losartan and Toprol XL   Acquired hypothyroidism Continue Synthroid   T2DM with neurological manifestations Hemoglobin A1c on 04/20/2022 was 6.3 Continue ISS and hypoglycemia protocol Metformin will be held at this time   Obesity class III (BMI 48.47) Diet and lifestyle modification   Hypoalbuminemia secondary to moderate protein calorie malnutrition Protein supplement will be provided   Hypokalemia K+ 3.4, this will be replenished     DVT prophylaxis:SCDs Code  Status: Full Family Communication: None at bedside Disposition Plan:  Status is: Inpatient Remains inpatient appropriate because: Need for IV medications  Consultants:  General Surgery IR  Procedures:  None  Antimicrobials:  Anti-infectives (From admission, onward)    Start     Dose/Rate Route Frequency Ordered Stop   05/05/22 0400  piperacillin-tazobactam (ZOSYN) IVPB 3.375 g        3.375 g 12.5 mL/hr over 240 Minutes Intravenous Every 8 hours 05/05/22 0131     05/04/22 2000  piperacillin-tazobactam (ZOSYN) IVPB 3.375 g        3.375 g 100 mL/hr over 30 Minutes Intravenous  Once 05/04/22 1949 05/04/22 2138       Subjective: Patient seen and evaluated today with no new acute complaints or concerns. No acute concerns or events noted overnight.  States that abdominal pain is improved.  Objective: Vitals:   05/04/22 1503 05/04/22 1905 05/04/22 2301 05/05/22 0507  BP: (!) 142/77 124/74 (!) 150/85 (!) 152/76  Pulse: (!) 102 89 98 91  Resp: 20 (!) '21 14 18  '$ Temp:  99.1 F (37.3 C) 98.2 F (36.8 C) 98.8 F (37.1 C)  TempSrc:  Oral    SpO2: 96% 100% 99% 93%  Weight:      Height:        Intake/Output Summary (Last 24 hours) at 05/05/2022 0645 Last data filed at 05/05/2022 0411 Gross per 24 hour  Intake 290 ml  Output 400 ml  Net -110 ml   Filed Weights   05/04/22 1143  Weight: 120.2 kg    Examination:  General exam: Appears calm and comfortable  Respiratory system: Clear to auscultation. Respiratory effort normal.  Nasal cannula oxygen Cardiovascular system: S1 &  S2 heard, RRR.  Gastrointestinal system: Abdomen is soft Central nervous system: Alert and awake Extremities: No edema Skin: No significant lesions noted Psychiatry: Flat affect.    Data Reviewed: I have personally reviewed following labs and imaging studies  CBC: Recent Labs  Lab 05/04/22 1555 05/05/22 0358  WBC 14.4* 11.3*  NEUTROABS 11.6*  --   HGB 10.9* 8.8*  HCT 37.3 29.6*  MCV 83.3  83.1  PLT 615* 193*   Basic Metabolic Panel: Recent Labs  Lab 05/04/22 1555 05/05/22 0358  NA 137 136  K 3.4* 3.4*  CL 101 102  CO2 24 27  GLUCOSE 117* 101*  BUN 9 13  CREATININE 0.87 0.95  CALCIUM 9.3 8.6*  MG  --  1.7  PHOS  --  2.9   GFR: Estimated Creatinine Clearance: 67 mL/min (by C-G formula based on SCr of 0.95 mg/dL). Liver Function Tests: Recent Labs  Lab 05/04/22 1555 05/05/22 0358  AST 19 17  ALT 14 12  ALKPHOS 87 73  BILITOT 0.6 0.8  PROT 7.4 5.8*  ALBUMIN 2.9* 2.3*   Recent Labs  Lab 05/04/22 1555  LIPASE 27   No results for input(s): "AMMONIA" in the last 168 hours. Coagulation Profile: No results for input(s): "INR", "PROTIME" in the last 168 hours. Cardiac Enzymes: No results for input(s): "CKTOTAL", "CKMB", "CKMBINDEX", "TROPONINI" in the last 168 hours. BNP (last 3 results) No results for input(s): "PROBNP" in the last 8760 hours. HbA1C: No results for input(s): "HGBA1C" in the last 72 hours. CBG: No results for input(s): "GLUCAP" in the last 168 hours. Lipid Profile: No results for input(s): "CHOL", "HDL", "LDLCALC", "TRIG", "CHOLHDL", "LDLDIRECT" in the last 72 hours. Thyroid Function Tests: No results for input(s): "TSH", "T4TOTAL", "FREET4", "T3FREE", "THYROIDAB" in the last 72 hours. Anemia Panel: No results for input(s): "VITAMINB12", "FOLATE", "FERRITIN", "TIBC", "IRON", "RETICCTPCT" in the last 72 hours. Sepsis Labs: No results for input(s): "PROCALCITON", "LATICACIDVEN" in the last 168 hours.  No results found for this or any previous visit (from the past 240 hour(s)).       Radiology Studies: CT ABDOMEN PELVIS WO CONTRAST  Result Date: 05/04/2022 CLINICAL DATA:  Status post appendectomy 2 weeks ago still having nausea, vomiting, and radiating pain in the abdomen EXAM: CT ABDOMEN AND PELVIS WITHOUT CONTRAST TECHNIQUE: Multidetector CT imaging of the abdomen and pelvis was performed following the standard protocol without  IV contrast. RADIATION DOSE REDUCTION: This exam was performed according to the departmental dose-optimization program which includes automated exposure control, adjustment of the mA and/or kV according to patient size and/or use of iterative reconstruction technique. COMPARISON:  CT abdomen and pelvis 04/19/2022 FINDINGS: Lower chest: No acute abnormality. Hepatobiliary: No focal liver abnormality is seen. Status post cholecystectomy. No biliary dilatation. Pancreas: Fatty atrophy.  No acute abnormality. Spleen: Unremarkable. Adrenals/Urinary Tract: Unchanged right adrenal myelolipoma measuring 4.0 cm, no follow-up recommended. No urinary calculi or hydronephrosis. Unremarkable bladder. Low-density right renal cyst does not require additional follow-up. Stomach/Bowel: Moderate hiatal hernia. Normal caliber large and small bowel. Colonic diverticulosis without diverticulitis. Postoperative change of appendectomy since 04/09/2022. Small fluid collection with adjacent stranding and containing a punctate calcification within the appendectomy bed. This measures approximately 3.5 x 2.3 x 1.5 cm (2/53 and 5/49). This is more organized and similar in size since 04/19/2022. This is intimately associated with the adjacent ileum and fistulous connection is difficult to exclude. Adjacent mesenteric fat stranding and edema. Vascular/Lymphatic: Aortic atherosclerosis. Subcentimeter right ileocolic nodes are likely reactive. Reproductive:  Hysterectomy. Other: No free intraperitoneal air. Musculoskeletal: L4-L5 posterior fusion. No acute osseous abnormality. IMPRESSION: Since CT 04/19/2022, interval postoperative change of appendectomy. Redemonstrated inflammatory mass/abscess abutting the terminal ileum and cecum measuring up to 3.5 cm and containing an appendicolith. This abuts the terminal ileum and fistulous connection is difficult to exclude. Moderate hiatal hernia. Electronically Signed   By: Placido Sou M.D.   On:  05/04/2022 19:27        Scheduled Meds:  amLODipine  2.5 mg Oral Daily   feeding supplement (GLUCERNA SHAKE)  237 mL Oral TID BM   insulin aspart  0-15 Units Subcutaneous TID WC   levothyroxine  150 mcg Oral QAC breakfast   losartan  25 mg Oral Daily   metoprolol succinate  50 mg Oral Daily   pantoprazole  40 mg Oral Daily   Continuous Infusions:  sodium chloride 100 mL/hr at 05/05/22 0139   piperacillin-tazobactam (ZOSYN)  IV 3.375 g (05/05/22 0402)     LOS: 1 day    Time spent: 35 minutes    Adyn Serna Darleen Crocker, DO [facility]  If 7PM-7AM, please contact night-coverage [email or web address] 05/05/2022, 6:45 AM

## 2022-05-05 NOTE — Progress Notes (Signed)
   Request received for abscess drain placement in IR per Dr Arnoldo Morale  Imaging reviewed with Dr Earleen Newport  Dx :  phlegmon - without abscess per Dr Earleen Newport Rec:  Conservative management at this point Rescan if needed in 3-5 days  No abscess drain needed  Will cancel order  I will make Dr Arnoldo Morale aware

## 2022-05-05 NOTE — Progress Notes (Signed)
Message sent to MD for admit orders for the floor.

## 2022-05-05 NOTE — Progress Notes (Signed)
Subjective: Patient did well overnight and has slight less nausea.  Objective: Vital signs in last 24 hours: Temp:  [98.2 F (36.8 C)-99.5 F (37.5 C)] 99.5 F (37.5 C) (01/30 0754) Pulse Rate:  [85-102] 85 (01/30 0950) Resp:  [14-21] 18 (01/30 0754) BP: (124-152)/(59-85) 131/70 (01/30 0952) SpO2:  [93 %-100 %] 94 % (01/30 0754) Last BM Date : 05/04/22  Intake/Output from previous day: 01/29 0701 - 01/30 0700 In: 290 [P.O.:240; IV Piggyback:50] Out: 400 [Urine:400] Intake/Output this shift: No intake/output data recorded.  General appearance: alert, cooperative, and no distress Resp: clear to auscultation bilaterally Cardio: regular rate and rhythm, S1, S2 normal, no murmur, click, rub or gallop GI: Soft with nonspecific discomfort to palpation.  No rigidity is noted.  Lab Results:  Recent Labs    05/04/22 1555 05/05/22 0358  WBC 14.4* 11.3*  HGB 10.9* 8.8*  HCT 37.3 29.6*  PLT 615* 492*   BMET Recent Labs    05/04/22 1555 05/05/22 0358  NA 137 136  K 3.4* 3.4*  CL 101 102  CO2 24 27  GLUCOSE 117* 101*  BUN 9 13  CREATININE 0.87 0.95  CALCIUM 9.3 8.6*   PT/INR Recent Labs    05/05/22 0759  LABPROT 14.5  INR 1.1    Studies/Results: CT ABDOMEN PELVIS WO CONTRAST  Result Date: 05/04/2022 CLINICAL DATA:  Status post appendectomy 2 weeks ago still having nausea, vomiting, and radiating pain in the abdomen EXAM: CT ABDOMEN AND PELVIS WITHOUT CONTRAST TECHNIQUE: Multidetector CT imaging of the abdomen and pelvis was performed following the standard protocol without IV contrast. RADIATION DOSE REDUCTION: This exam was performed according to the departmental dose-optimization program which includes automated exposure control, adjustment of the mA and/or kV according to patient size and/or use of iterative reconstruction technique. COMPARISON:  CT abdomen and pelvis 04/19/2022 FINDINGS: Lower chest: No acute abnormality. Hepatobiliary: No focal liver  abnormality is seen. Status post cholecystectomy. No biliary dilatation. Pancreas: Fatty atrophy.  No acute abnormality. Spleen: Unremarkable. Adrenals/Urinary Tract: Unchanged right adrenal myelolipoma measuring 4.0 cm, no follow-up recommended. No urinary calculi or hydronephrosis. Unremarkable bladder. Low-density right renal cyst does not require additional follow-up. Stomach/Bowel: Moderate hiatal hernia. Normal caliber large and small bowel. Colonic diverticulosis without diverticulitis. Postoperative change of appendectomy since 04/09/2022. Small fluid collection with adjacent stranding and containing a punctate calcification within the appendectomy bed. This measures approximately 3.5 x 2.3 x 1.5 cm (2/53 and 5/49). This is more organized and similar in size since 04/19/2022. This is intimately associated with the adjacent ileum and fistulous connection is difficult to exclude. Adjacent mesenteric fat stranding and edema. Vascular/Lymphatic: Aortic atherosclerosis. Subcentimeter right ileocolic nodes are likely reactive. Reproductive: Hysterectomy. Other: No free intraperitoneal air. Musculoskeletal: L4-L5 posterior fusion. No acute osseous abnormality. IMPRESSION: Since CT 04/19/2022, interval postoperative change of appendectomy. Redemonstrated inflammatory mass/abscess abutting the terminal ileum and cecum measuring up to 3.5 cm and containing an appendicolith. This abuts the terminal ileum and fistulous connection is difficult to exclude. Moderate hiatal hernia. Electronically Signed   By: Placido Sou M.D.   On: 05/04/2022 19:27    Anti-infectives: Anti-infectives (From admission, onward)    Start     Dose/Rate Route Frequency Ordered Stop   05/05/22 0400  piperacillin-tazobactam (ZOSYN) IVPB 3.375 g        3.375 g 12.5 mL/hr over 240 Minutes Intravenous Every 8 hours 05/05/22 0131     05/04/22 2000  piperacillin-tazobactam (ZOSYN) IVPB 3.375 g  3.375 g 100 mL/hr over 30 Minutes  Intravenous  Once 05/04/22 1949 05/04/22 2138       Assessment/Plan: Impression: Patient's CT scan was reviewed by IR.  They feel there is just a phlegmon in the area of the appendectomy and there is no drainable abscess.  Will continue conservative management at the present time.  Continue IV Zosyn.  Will place patient on a diet.  Is reassuring that her white blood cell count has almost normalized.  LOS: 1 day    Aviva Signs 05/05/2022

## 2022-05-06 LAB — BASIC METABOLIC PANEL
Anion gap: 4 — ABNORMAL LOW (ref 5–15)
BUN: 14 mg/dL (ref 8–23)
CO2: 28 mmol/L (ref 22–32)
Calcium: 8.6 mg/dL — ABNORMAL LOW (ref 8.9–10.3)
Chloride: 104 mmol/L (ref 98–111)
Creatinine, Ser: 1.02 mg/dL — ABNORMAL HIGH (ref 0.44–1.00)
GFR, Estimated: 59 mL/min — ABNORMAL LOW (ref >60)
Glucose, Bld: 98 mg/dL (ref 70–99)
Potassium: 3.7 mmol/L (ref 3.5–5.1)
Sodium: 136 mmol/L (ref 135–145)

## 2022-05-06 LAB — CBC
HCT: 29.2 % — ABNORMAL LOW (ref 36.0–46.0)
Hemoglobin: 8.2 g/dL — ABNORMAL LOW (ref 12.0–15.0)
MCH: 24.2 pg — ABNORMAL LOW (ref 26.0–34.0)
MCHC: 28.1 g/dL — ABNORMAL LOW (ref 30.0–36.0)
MCV: 86.1 fL (ref 80.0–100.0)
Platelets: 454 10*3/uL — ABNORMAL HIGH (ref 150–400)
RBC: 3.39 MIL/uL — ABNORMAL LOW (ref 3.87–5.11)
RDW: 17.2 % — ABNORMAL HIGH (ref 11.5–15.5)
WBC: 7.9 10*3/uL (ref 4.0–10.5)
nRBC: 0 % (ref 0.0–0.2)

## 2022-05-06 LAB — GLUCOSE, CAPILLARY
Glucose-Capillary: 123 mg/dL — ABNORMAL HIGH (ref 70–99)
Glucose-Capillary: 125 mg/dL — ABNORMAL HIGH (ref 70–99)
Glucose-Capillary: 128 mg/dL — ABNORMAL HIGH (ref 70–99)
Glucose-Capillary: 124 mg/dL — ABNORMAL HIGH (ref 70–99)

## 2022-05-06 LAB — MAGNESIUM: Magnesium: 1.7 mg/dL (ref 1.7–2.4)

## 2022-05-06 LAB — PHOSPHORUS: Phosphorus: 2.6 mg/dL (ref 2.5–4.6)

## 2022-05-06 NOTE — Progress Notes (Signed)
Subjective: Patient still has generalized malaise.  Her abdomen is uncomfortable.  Is passing some gas.  Objective: Vital signs in last 24 hours: Temp:  [97.1 F (36.2 C)-99.2 F (37.3 C)] 99.2 F (37.3 C) (01/31 0850) Pulse Rate:  [76-81] 80 (01/31 0850) Resp:  [16-18] 18 (01/31 0555) BP: (126-153)/(62-77) 143/65 (01/31 0850) SpO2:  [92 %-96 %] 93 % (01/31 0850) Last BM Date : 05/05/22  Intake/Output from previous day: 01/30 0701 - 01/31 0700 In: 1009.5 [P.O.:240; I.V.:710.9; IV Piggyback:58.7] Out: -  Intake/Output this shift: Total I/O In: 480 [P.O.:480] Out: -   General appearance: alert, cooperative, and no distress Resp: clear to auscultation bilaterally Cardio: regular rate and rhythm, S1, S2 normal, no murmur, click, rub or gallop GI: Soft, nondistended.  Varied abdominal discomfort to palpation noted, but no peritoneal signs.  Lab Results:  Recent Labs    05/05/22 0358 05/06/22 0355  WBC 11.3* 7.9  HGB 8.8* 8.2*  HCT 29.6* 29.2*  PLT 492* 454*   BMET Recent Labs    05/05/22 0358 05/06/22 0355  NA 136 136  K 3.4* 3.7  CL 102 104  CO2 27 28  GLUCOSE 101* 98  BUN 13 14  CREATININE 0.95 1.02*  CALCIUM 8.6* 8.6*   PT/INR Recent Labs    05/05/22 0759  LABPROT 14.5  INR 1.1    Studies/Results: CT ABDOMEN PELVIS WO CONTRAST  Result Date: 05/04/2022 CLINICAL DATA:  Status post appendectomy 2 weeks ago still having nausea, vomiting, and radiating pain in the abdomen EXAM: CT ABDOMEN AND PELVIS WITHOUT CONTRAST TECHNIQUE: Multidetector CT imaging of the abdomen and pelvis was performed following the standard protocol without IV contrast. RADIATION DOSE REDUCTION: This exam was performed according to the departmental dose-optimization program which includes automated exposure control, adjustment of the mA and/or kV according to patient size and/or use of iterative reconstruction technique. COMPARISON:  CT abdomen and pelvis 04/19/2022 FINDINGS: Lower  chest: No acute abnormality. Hepatobiliary: No focal liver abnormality is seen. Status post cholecystectomy. No biliary dilatation. Pancreas: Fatty atrophy.  No acute abnormality. Spleen: Unremarkable. Adrenals/Urinary Tract: Unchanged right adrenal myelolipoma measuring 4.0 cm, no follow-up recommended. No urinary calculi or hydronephrosis. Unremarkable bladder. Low-density right renal cyst does not require additional follow-up. Stomach/Bowel: Moderate hiatal hernia. Normal caliber large and small bowel. Colonic diverticulosis without diverticulitis. Postoperative change of appendectomy since 04/09/2022. Small fluid collection with adjacent stranding and containing a punctate calcification within the appendectomy bed. This measures approximately 3.5 x 2.3 x 1.5 cm (2/53 and 5/49). This is more organized and similar in size since 04/19/2022. This is intimately associated with the adjacent ileum and fistulous connection is difficult to exclude. Adjacent mesenteric fat stranding and edema. Vascular/Lymphatic: Aortic atherosclerosis. Subcentimeter right ileocolic nodes are likely reactive. Reproductive: Hysterectomy. Other: No free intraperitoneal air. Musculoskeletal: L4-L5 posterior fusion. No acute osseous abnormality. IMPRESSION: Since CT 04/19/2022, interval postoperative change of appendectomy. Redemonstrated inflammatory mass/abscess abutting the terminal ileum and cecum measuring up to 3.5 cm and containing an appendicolith. This abuts the terminal ileum and fistulous connection is difficult to exclude. Moderate hiatal hernia. Electronically Signed   By: Placido Sou M.D.   On: 05/04/2022 19:27    Anti-infectives: Anti-infectives (From admission, onward)    Start     Dose/Rate Route Frequency Ordered Stop   05/05/22 0400  piperacillin-tazobactam (ZOSYN) IVPB 3.375 g        3.375 g 12.5 mL/hr over 240 Minutes Intravenous Every 8 hours 05/05/22 0131  05/04/22 2000  piperacillin-tazobactam (ZOSYN)  IVPB 3.375 g        3.375 g 100 mL/hr over 30 Minutes Intravenous  Once 05/04/22 1949 05/04/22 2138       Assessment/Plan: Impression: Status post laparoscopic appendectomy for perforated appendicitis.  Now with phlegmon in place in the right lower quadrant.  Leukocytosis has resolved.  It is not drainable.  Will continue IV antibiotics for now.  I encouraged the patient to ambulate.  Will need 2 weeks worth of antibiotics once she is feeling better.   LOS: 2 days    Aviva Signs 05/06/2022

## 2022-05-06 NOTE — TOC Transition Note (Signed)
Transition of Care [facility]) - CM/SW Discharge Note   Patient Details  Name: Yesenia [name] MRN: [id number] Date of Birth: [birth year]  [facility] CM/SW Contact:  Boneta Lucks, RN Phone Number: 05/06/2022, 9:01 AM   Clinical Narrative:   Patient's last admission we discharge with [facility] home health. CM call to review with patient to get new orders. She does not feel like she needs home health. She lives with her son and he assist as needed. [name] updated.    Final next level of care: Home/Self Care   Discharge Placement    Home  Discharge Plan and Services Additional resources added to the After Visit Summary for        Social Determinants of Health (SDOH) Interventions SDOH Screenings   Food Insecurity: No Food Insecurity (04/20/2022)  Housing: Low Risk  (04/20/2022)  Transportation Needs: No Transportation Needs (04/20/2022)  Utilities: Not At Risk (04/20/2022)  Alcohol Screen: Low Risk  (05/22/2020)  Depression (PHQ2-9): Medium Risk (05/22/2020)  Financial Resource Strain: Low Risk  (02/21/2020)  Tobacco Use: Low Risk  (05/04/2022)    Readmission Risk Interventions    05/06/2022    9:01 AM 04/23/2022    2:39 PM  Readmission Risk Prevention Plan  Post Dischage Appt  Not Complete  Medication Screening  Complete  Transportation Screening Complete Complete  PCP or Specialist Appt within 5-7 Days Complete   Home Care Screening Complete   Medication Review (RN CM) Complete

## 2022-05-06 NOTE — Progress Notes (Signed)
Patient able to make needs known, fair appetite this shift with some nausea which resolved with PRN zofran , patient able to ambulate to restroom with supervision. Pain controlled with PRN dilaudid , new IV access to RFA and fluids started zosyn antibiotic.

## 2022-05-06 NOTE — Progress Notes (Signed)
No acute events overnight.

## 2022-05-07 ENCOUNTER — Inpatient Hospital Stay (HOSPITAL_COMMUNITY): Payer: Medicare HMO

## 2022-05-07 LAB — GLUCOSE, CAPILLARY
Glucose-Capillary: 109 mg/dL — ABNORMAL HIGH (ref 70–99)
Glucose-Capillary: 110 mg/dL — ABNORMAL HIGH (ref 70–99)
Glucose-Capillary: 131 mg/dL — ABNORMAL HIGH (ref 70–99)
Glucose-Capillary: 119 mg/dL — ABNORMAL HIGH (ref 70–99)

## 2022-05-07 MED ORDER — IOHEXOL 9 MG/ML PO SOLN
ORAL | Status: AC
  Filled 2022-05-07: qty 1000

## 2022-05-07 MED ORDER — IOHEXOL 300 MG/ML  SOLN
100.0000 mL | Freq: Once | INTRAMUSCULAR | Status: AC | PRN
  Administered 2022-05-07: 100 mL via INTRAVENOUS

## 2022-05-07 NOTE — Progress Notes (Signed)
Patient's pain controlled with dilaudid multiple times this shift, patient complained of nausea most of the shift, she reports relief with zofran.

## 2022-05-07 NOTE — Progress Notes (Signed)
  Subjective: Patient has nonspecific discomfort in the abdomen, though it was more periumbilical in nature.  No nausea or vomiting.  She is having bowel movements.  Objective: Vital signs in last 24 hours: Temp:  [97.4 F (36.3 C)-99.2 F (37.3 C)] 97.4 F (36.3 C) (02/01 0406) Pulse Rate:  [43-82] 81 (02/01 0406) Resp:  [18] 18 (02/01 0406) BP: (131-149)/(65-76) 149/74 (02/01 0406) SpO2:  [83 %-99 %] 99 % (02/01 0406) Last BM Date : 05/05/22  Intake/Output from previous day: 01/31 0701 - 02/01 0700 In: 1484.8 [P.O.:1200; IV Piggyback:284.8] Out: -  Intake/Output this shift: No intake/output data recorded.  General appearance: alert, cooperative, and no distress Resp: clear to auscultation bilaterally Cardio: regular rate and rhythm, S1, S2 normal, no murmur, click, rub or gallop GI: soft, non-tender; bowel sounds normal; no masses,  no organomegaly  Lab Results:  Recent Labs    05/05/22 0358 05/06/22 0355  WBC 11.3* 7.9  HGB 8.8* 8.2*  HCT 29.6* 29.2*  PLT 492* 454*   BMET Recent Labs    05/05/22 0358 05/06/22 0355  NA 136 136  K 3.4* 3.7  CL 102 104  CO2 27 28  GLUCOSE 101* 98  BUN 13 14  CREATININE 0.95 1.02*  CALCIUM 8.6* 8.6*   PT/INR Recent Labs    05/05/22 0759  LABPROT 14.5  INR 1.1    Studies/Results: No results found.  Anti-infectives: Anti-infectives (From admission, onward)    Start     Dose/Rate Route Frequency Ordered Stop   05/05/22 0400  piperacillin-tazobactam (ZOSYN) IVPB 3.375 g        3.375 g 12.5 mL/hr over 240 Minutes Intravenous Every 8 hours 05/05/22 0131     05/04/22 2000  piperacillin-tazobactam (ZOSYN) IVPB 3.375 g        3.375 g 100 mL/hr over 30 Minutes Intravenous  Once 05/04/22 1949 05/04/22 2138       Assessment/Plan: Impression: Intra-abdominal phlegmon, status post laparoscopic appendectomy in the past.  Still on IV Zosyn. Plan: Will get CT scan of the abdomen for follow-up to see whether she has an  abscess that is drainable.  Further management is pending those results.  LOS: 3 days    Aviva Signs 05/07/2022

## 2022-05-08 LAB — GLUCOSE, CAPILLARY
Glucose-Capillary: 101 mg/dL — ABNORMAL HIGH (ref 70–99)
Glucose-Capillary: 116 mg/dL — ABNORMAL HIGH (ref 70–99)
Glucose-Capillary: 134 mg/dL — ABNORMAL HIGH (ref 70–99)
Glucose-Capillary: 115 mg/dL — ABNORMAL HIGH (ref 70–99)

## 2022-05-08 MED ORDER — IPRATROPIUM-ALBUTEROL 0.5-2.5 (3) MG/3ML IN SOLN
3.0000 mL | Freq: Once | RESPIRATORY_TRACT | Status: AC
  Administered 2022-05-08: 3 mL via RESPIRATORY_TRACT
  Filled 2022-05-08: qty 3

## 2022-05-08 MED ORDER — ALBUTEROL SULFATE (2.5 MG/3ML) 0.083% IN NEBU: 2.5000 mg | INHALATION_SOLUTION | RESPIRATORY_TRACT | Status: DC | PRN

## 2022-05-08 MED ORDER — PANTOPRAZOLE SODIUM 40 MG PO TBEC
40.0000 mg | DELAYED_RELEASE_TABLET | Freq: Two times a day (BID) | ORAL | Status: DC
  Administered 2022-05-08 – 2022-05-09 (×2): 40 mg via ORAL
  Filled 2022-05-08 (×2): qty 1

## 2022-05-08 NOTE — Progress Notes (Signed)
Pt required PRN Dilaudid and Zofran. Pt ambulatory with 1 assist. No acute events overnight. Yesenia Corona Edd Fabian

## 2022-05-08 NOTE — Plan of Care (Signed)

## 2022-05-08 NOTE — Progress Notes (Signed)
Subjective: Patient complaining of some epigastric discomfort.  It started 15 minutes prior to my examination.  Objective: Vital signs in last 24 hours: Temp:  [98.6 F (37 C)-99.3 F (37.4 C)] 98.6 F (37 C) (02/02 0511) Pulse Rate:  [74-81] 74 (02/01 2105) Resp:  [17-20] 17 (02/02 0511) BP: (117-139)/(53-71) 123/64 (02/02 0511) SpO2:  [93 %-95 %] 93 % (02/02 0511) Last BM Date : 05/07/22  Intake/Output from previous day: 02/01 0701 - 02/02 0700 In: 890 [P.O.:840; IV Piggyback:50] Out: -  Intake/Output this shift: No intake/output data recorded.  General appearance: alert, cooperative, and no distress Resp: clear to auscultation bilaterally Cardio: regular rate and rhythm, S1, S2 normal, no murmur, click, rub or gallop GI: Soft, discomfort to palpation in the epigastric region.  No distention.  No rigidity noted.  Lab Results:  Recent Labs    05/06/22 0355  WBC 7.9  HGB 8.2*  HCT 29.2*  PLT 454*   BMET Recent Labs    05/06/22 0355  NA 136  K 3.7  CL 104  CO2 28  GLUCOSE 98  BUN 14  CREATININE 1.02*  CALCIUM 8.6*   PT/INR No results for input(s): "LABPROT", "INR" in the last 72 hours.  Studies/Results: CT ABDOMEN PELVIS W CONTRAST  Result Date: 05/07/2022 CLINICAL DATA:  Intra-abdominal abscess. EXAM: CT ABDOMEN AND PELVIS WITH CONTRAST TECHNIQUE: Multidetector CT imaging of the abdomen and pelvis was performed using the standard protocol following bolus administration of intravenous contrast. RADIATION DOSE REDUCTION: This exam was performed according to the departmental dose-optimization program which includes automated exposure control, adjustment of the mA and/or kV according to patient size and/or use of iterative reconstruction technique. CONTRAST:  19m OMNIPAQUE IOHEXOL 300 MG/ML  SOLN COMPARISON:  May 04, 2022. FINDINGS: Lower chest: Visualized lung bases are unremarkable. Stable moderate size hiatal hernia. Hepatobiliary: No focal liver  abnormality is seen. Status post cholecystectomy. No biliary dilatation. Pancreas: Unremarkable. No pancreatic ductal dilatation or surrounding inflammatory changes. Spleen: Normal in size without focal abnormality. Adrenals/Urinary Tract: Stable right adrenal myelolipoma. Bilateral renal cysts are noted for which no further follow-up is required. No hydronephrosis or renal obstruction is noted. Urinary bladder is decompressed. Stomach/Bowel: There is no evidence of bowel obstruction. Status post appendectomy. There is no evidence of bowel obstruction. Sigmoid diverticulosis is noted without definite inflammation. No definite fluid collection or abscess is noted in the right lower quadrant. There is noted mildly dilated small bowel in the right lower quadrant which may represent ileus. Vascular/Lymphatic: Aortic atherosclerosis. No enlarged abdominal or pelvic lymph nodes. Reproductive: Status post hysterectomy. No adnexal masses. Other: No abdominal wall hernia or abnormality. No abdominopelvic ascites. Musculoskeletal: No acute or significant osseous findings. IMPRESSION: Status post appendectomy. No definite abscess or fluid collection is noted in the right lower quadrant. Mildly dilated small bowel loops are noted in the right lower quadrant which may represent postoperative ileus. Sigmoid diverticulosis is noted without inflammation. Stable right adrenal myelolipoma. Moderate size hiatal hernia is noted. Aortic Atherosclerosis (ICD10-I70.0). Electronically Signed   By: JMarijo ConceptionM.D.   On: 05/07/2022 11:59    Anti-infectives: Anti-infectives (From admission, onward)    Start     Dose/Rate Route Frequency Ordered Stop   05/05/22 0400  piperacillin-tazobactam (ZOSYN) IVPB 3.375 g        3.375 g 12.5 mL/hr over 240 Minutes Intravenous Every 8 hours 05/05/22 0131     05/04/22 2000  piperacillin-tazobactam (ZOSYN) IVPB 3.375 g  3.375 g 100 mL/hr over 30 Minutes Intravenous  Once 05/04/22 1949  05/04/22 2138       Assessment/Plan: Impression: CT scan follow-up showed no abscess or fluid collection in the right lower quadrant.  No other acute findings noted on CT scan.  Phlegmon has resolved with IV antibiotics. Plan: Will increase Protonix.  Anticipate discharge in next 24 to 48 hours.  Continue IV Zosyn.  Encourage ambulation.  LOS: 4 days    Aviva Signs 05/08/2022

## 2022-05-08 NOTE — Care Management Important Message (Signed)
Important Message  Patient Details  Name: Yesenia [name] MRN: [id number] Date of Birth: 1951-02-19   Medicare Important Message Given:  Yes     Tommy Medal 05/08/2022, 10:58 AM

## 2022-05-09 LAB — GLUCOSE, CAPILLARY
Glucose-Capillary: 51 mg/dL — ABNORMAL LOW (ref 70–99)
Glucose-Capillary: 116 mg/dL — ABNORMAL HIGH (ref 70–99)

## 2022-05-09 MED ORDER — AMOXICILLIN-POT CLAVULANATE 500-125 MG PO TABS: 1.0000 | ORAL_TABLET | Freq: Three times a day (TID) | ORAL | 0 refills | Status: DC

## 2022-05-09 MED ORDER — HYDROCODONE-ACETAMINOPHEN 5-325 MG PO TABS: 1.0000 | ORAL_TABLET | Freq: Four times a day (QID) | ORAL | 0 refills | Status: DC | PRN

## 2022-05-09 NOTE — Plan of Care (Signed)

## 2022-05-09 NOTE — Plan of Care (Signed)
  Problem: Pain Managment: Goal: General experience of comfort will improve Outcome: Progressing   

## 2022-05-09 NOTE — Progress Notes (Signed)
Patient rested through the night. Patient needed Zofran and dilaudid to control pain and nausea. No acute events overnight.

## 2022-05-09 NOTE — Progress Notes (Signed)
Patient bg 51, patient eating breakfast. Asymptomatic, notified Dr. Arnoldo Morale.

## 2022-05-09 NOTE — Progress Notes (Signed)
Patient blood sugar improved to 116.

## 2022-05-09 NOTE — Discharge Summary (Signed)
Physician Discharge Summary  Patient ID: Yesenia [name] MRN: [id number] DOB/AGE: [birth year] 72 y.o.  Admit date: 05/04/2022 Discharge date: 05/09/2022  Admission Diagnoses: Postoperative intra-abdominal infection, status post laparoscopic appendectomy for perforated appendicitis on 04/20/2022 who presented back to the emergency room on 05/04/2022 with lower abdominal pain and nausea.  CT scan of the abdomen pelvis revealed a phlegmon in the right lower quadrant, but there was no abscess.  She did have a leukocytosis.  She was admitted to the hospital for further evaluation and treatment.  She was started on IV Zosyn.  The following day, her white blood cell count normalized.  She did have a follow-up CT scan of the abdomen on 05/07/2022 which revealed a resolving phlegmon without abscess formation.  There was no need for acute surgical intervention.  The patient is being discharged home on 05/09/2022 in good and improving condition.  She will continue Augmentin for 10 days.  Discharge Diagnoses: Same Principal Problem:   Abdominal pain Active Problems:   Essential hypertension   Acquired hypothyroidism   Obesity, Class III, BMI 40-49.9 (morbid obesity) (HCC)   Type II diabetes mellitus with neurological manifestations (HCC)   Hypokalemia   Hypoalbuminemia due to protein-calorie malnutrition [facility])   Discharged Condition: good  Hospital Course: Patient is a 72 year old white female status post robotic assisted laparoscopic appendectomy for perforated appendicitis  Significant Diagnostic Studies: CT scan of abdomen and pelvis x 2   Discharge Exam: Blood pressure 139/78, pulse 80, temperature 98.1 F (36.7 C), temperature source Oral, resp. rate 19, height '5\' 2"'$  (1.575 m), weight 120.2 kg, SpO2 100 %. General appearance: alert, cooperative, and no distress Resp: clear to auscultation bilaterally Cardio: regular rate and rhythm, S1, S2 normal, no murmur, click, rub or gallop GI: soft,  non-tender; bowel sounds normal; no masses,  no organomegaly  Disposition: Discharge disposition: 01-Home or Self Care        Allergies as of 05/09/2022       Reactions   Aspirin Other (See Comments)   G.I. Upset    Nalbuphine Itching, Swelling, Rash   Nubaine        Medication List     STOP taking these medications    oxyCODONE 5 MG immediate release tablet Commonly known as: Roxicodone       TAKE these medications    amitriptyline 10 MG tablet Commonly known as: ELAVIL Take 10 mg by mouth at bedtime.   amLODipine 2.5 MG tablet Commonly known as: NORVASC Take 2.5 mg by mouth daily.   amoxicillin-clavulanate 500-125 MG tablet Commonly known as: Augmentin Take 1 tablet by mouth 3 (three) times daily.   Blood Glucose System Pak Kit Please dispense as Accu Chek Guide. USE TO TEST BLOOD SUGAR 3 TIMES DAILY. Dx: E11.65.   BLOOD GLUCOSE TEST STRIPS Strp Please dispense as Accu Chek Guide. USE TO TEST BLOOD SUGAR 3 TIMES DAILY. Dx: E11.65   calcium carbonate 500 MG chewable tablet Commonly known as: Tums Chew 1 tablet (200 mg of elemental calcium total) by mouth 2 (two) times daily.   cholecalciferol 25 MCG (1000 UNIT) tablet Commonly known as: VITAMIN D3 TAKE (1) TABLET BY MOUTH ONCE DAILY. What changed: See the new instructions.   dexlansoprazole 60 MG capsule Commonly known as: DEXILANT TAKE 1 CAPSULE DAILY.   docusate sodium 100 MG capsule Commonly known as: Colace Take 1 capsule (100 mg total) by mouth 2 (two) times daily.   DULoxetine 30 MG capsule Commonly known as: CYMBALTA Take 30  mg by mouth every evening. Q PM  Take in addition to '60mg'$  Q AM   furosemide 40 MG tablet Commonly known as: LASIX TAKE (1) TABLET BY MOUTH ONCE DAILY AS NEEDED. What changed: See the new instructions.   HYDROcodone-acetaminophen 5-325 MG tablet Commonly known as: Norco Take 1 tablet by mouth every 6 (six) hours as needed for moderate pain.   Lancet Devices  Misc Please dispense based on patient and insurance preference. Monitor FSBS 3x daily. Dx: E11.65   Lancets Misc Please dispense as Accu Chek Guide. USE TO TEST BLOOD SUGAR 3 TIMES DAILY. Dx: E11.65.   levothyroxine 150 MCG tablet Commonly known as: SYNTHROID TAKE (1) TABLET BY MOUTH ONCE DAILY BEFORE BREAKFAST. What changed: See the new instructions.   Linzess 290 MCG Caps capsule Generic drug: linaclotide TAKE 1 CAPSULE ONCE DAILY BEFORE BREAKFAST What changed: See the new instructions.   losartan 25 MG tablet Commonly known as: COZAAR Take 1 tablet by mouth daily.   metFORMIN 500 MG tablet Commonly known as: GLUCOPHAGE Take 500 mg by mouth daily.   metoprolol succinate 50 MG 24 hr tablet Commonly known as: TOPROL-XL Take 50 mg by mouth daily.   ondansetron 4 MG tablet Commonly known as: Zofran Take 1 tablet (4 mg total) by mouth every 8 (eight) hours as needed for nausea or vomiting.   traZODone 100 MG tablet Commonly known as: DESYREL Take 100 mg by mouth at bedtime.        Follow-up Information     Aviva Signs, MD Follow up.   Specialty: General Surgery Why: As needed Contact information: [address] [place] [address] [phone]                 Signed: Aviva Signs 05/09/2022, 10:19 AM

## 2022-05-12 ENCOUNTER — Encounter (HOSPITAL_COMMUNITY): Payer: Self-pay

## 2022-05-12 ENCOUNTER — Inpatient Hospital Stay (HOSPITAL_COMMUNITY)
Admission: EM | Admit: 2022-05-12 | Discharge: 2022-05-15 | DRG: 389 | Disposition: A | Discharge status: Home / Self Care | Payer: Medicare HMO | Attending: General Surgery | Admitting: General Surgery

## 2022-05-12 ENCOUNTER — Other Ambulatory Visit: Payer: Self-pay

## 2022-05-12 ENCOUNTER — Emergency Department (HOSPITAL_COMMUNITY): Payer: Medicare HMO

## 2022-05-12 DIAGNOSIS — Z8711 Personal history of peptic ulcer disease: Secondary | ICD-10-CM

## 2022-05-12 DIAGNOSIS — M797 Fibromyalgia: Secondary | ICD-10-CM | POA: Diagnosis present

## 2022-05-12 DIAGNOSIS — E876 Hypokalemia: Secondary | ICD-10-CM | POA: Diagnosis not present

## 2022-05-12 DIAGNOSIS — H409 Unspecified glaucoma: Secondary | ICD-10-CM | POA: Diagnosis present

## 2022-05-12 DIAGNOSIS — E1149 Type 2 diabetes mellitus with other diabetic neurological complication: Secondary | ICD-10-CM | POA: Diagnosis present

## 2022-05-12 DIAGNOSIS — Z6841 Body Mass Index (BMI) 40.0 and over, adult: Secondary | ICD-10-CM | POA: Diagnosis not present

## 2022-05-12 DIAGNOSIS — R197 Diarrhea, unspecified: Secondary | ICD-10-CM | POA: Diagnosis not present

## 2022-05-12 DIAGNOSIS — M810 Age-related osteoporosis without current pathological fracture: Secondary | ICD-10-CM | POA: Diagnosis present

## 2022-05-12 DIAGNOSIS — Z7984 Long term (current) use of oral hypoglycemic drugs: Secondary | ICD-10-CM

## 2022-05-12 DIAGNOSIS — Z90710 Acquired absence of both cervix and uterus: Secondary | ICD-10-CM

## 2022-05-12 DIAGNOSIS — Z961 Presence of intraocular lens: Secondary | ICD-10-CM | POA: Diagnosis present

## 2022-05-12 DIAGNOSIS — R1084 Generalized abdominal pain: Principal | ICD-10-CM

## 2022-05-12 DIAGNOSIS — K760 Fatty (change of) liver, not elsewhere classified: Secondary | ICD-10-CM | POA: Diagnosis present

## 2022-05-12 DIAGNOSIS — Z86711 Personal history of pulmonary embolism: Secondary | ICD-10-CM

## 2022-05-12 DIAGNOSIS — R109 Unspecified abdominal pain: Secondary | ICD-10-CM | POA: Diagnosis not present

## 2022-05-12 DIAGNOSIS — E039 Hypothyroidism, unspecified: Secondary | ICD-10-CM | POA: Diagnosis not present

## 2022-05-12 DIAGNOSIS — Z888 Allergy status to other drugs, medicaments and biological substances status: Secondary | ICD-10-CM

## 2022-05-12 DIAGNOSIS — Z23 Encounter for immunization: Secondary | ICD-10-CM | POA: Diagnosis not present

## 2022-05-12 DIAGNOSIS — Z8262 Family history of osteoporosis: Secondary | ICD-10-CM

## 2022-05-12 DIAGNOSIS — Z79899 Other long term (current) drug therapy: Secondary | ICD-10-CM

## 2022-05-12 DIAGNOSIS — F419 Anxiety disorder, unspecified: Secondary | ICD-10-CM | POA: Diagnosis present

## 2022-05-12 DIAGNOSIS — K56609 Unspecified intestinal obstruction, unspecified as to partial versus complete obstruction: Secondary | ICD-10-CM | POA: Diagnosis not present

## 2022-05-12 DIAGNOSIS — R627 Adult failure to thrive: Secondary | ICD-10-CM | POA: Diagnosis not present

## 2022-05-12 DIAGNOSIS — Z8249 Family history of ischemic heart disease and other diseases of the circulatory system: Secondary | ICD-10-CM | POA: Diagnosis not present

## 2022-05-12 DIAGNOSIS — Z9049 Acquired absence of other specified parts of digestive tract: Secondary | ICD-10-CM

## 2022-05-12 DIAGNOSIS — K219 Gastro-esophageal reflux disease without esophagitis: Secondary | ICD-10-CM | POA: Diagnosis present

## 2022-05-12 DIAGNOSIS — Z9841 Cataract extraction status, right eye: Secondary | ICD-10-CM

## 2022-05-12 DIAGNOSIS — Z9889 Other specified postprocedural states: Secondary | ICD-10-CM | POA: Diagnosis not present

## 2022-05-12 DIAGNOSIS — Z87442 Personal history of urinary calculi: Secondary | ICD-10-CM

## 2022-05-12 DIAGNOSIS — K5730 Diverticulosis of large intestine without perforation or abscess without bleeding: Secondary | ICD-10-CM | POA: Diagnosis not present

## 2022-05-12 DIAGNOSIS — Z9842 Cataract extraction status, left eye: Secondary | ICD-10-CM

## 2022-05-12 DIAGNOSIS — N281 Cyst of kidney, acquired: Secondary | ICD-10-CM | POA: Diagnosis not present

## 2022-05-12 DIAGNOSIS — K6389 Other specified diseases of intestine: Secondary | ICD-10-CM | POA: Diagnosis not present

## 2022-05-12 DIAGNOSIS — M5136 Other intervertebral disc degeneration, lumbar region: Secondary | ICD-10-CM | POA: Diagnosis present

## 2022-05-12 DIAGNOSIS — I10 Essential (primary) hypertension: Secondary | ICD-10-CM | POA: Diagnosis not present

## 2022-05-12 DIAGNOSIS — Z886 Allergy status to analgesic agent status: Secondary | ICD-10-CM

## 2022-05-12 DIAGNOSIS — R112 Nausea with vomiting, unspecified: Secondary | ICD-10-CM | POA: Diagnosis not present

## 2022-05-12 DIAGNOSIS — Z9851 Tubal ligation status: Secondary | ICD-10-CM

## 2022-05-12 DIAGNOSIS — Z79890 Hormone replacement therapy (postmenopausal): Secondary | ICD-10-CM

## 2022-05-12 LAB — COMPREHENSIVE METABOLIC PANEL
ALT: 10 U/L (ref 0–44)
AST: 17 U/L (ref 15–41)
Albumin: 3.3 g/dL — ABNORMAL LOW (ref 3.5–5.0)
Alkaline Phosphatase: 68 U/L (ref 38–126)
Anion gap: 12 (ref 5–15)
BUN: 11 mg/dL (ref 8–23)
CO2: 25 mmol/L (ref 22–32)
Calcium: 10.3 mg/dL (ref 8.9–10.3)
Chloride: 99 mmol/L (ref 98–111)
Creatinine, Ser: 0.92 mg/dL (ref 0.44–1.00)
GFR, Estimated: >60 mL/min (ref >60)
Glucose, Bld: 119 mg/dL — ABNORMAL HIGH (ref 70–99)
Potassium: 3.5 mmol/L (ref 3.5–5.1)
Sodium: 136 mmol/L (ref 135–145)
Total Bilirubin: 0.4 mg/dL (ref 0.3–1.2)
Total Protein: 8.3 g/dL — ABNORMAL HIGH (ref 6.5–8.1)

## 2022-05-12 LAB — CBC
HCT: 38 % (ref 36.0–46.0)
Hemoglobin: 10.6 g/dL — ABNORMAL LOW (ref 12.0–15.0)
MCH: 24.1 pg — ABNORMAL LOW (ref 26.0–34.0)
MCHC: 27.9 g/dL — ABNORMAL LOW (ref 30.0–36.0)
MCV: 86.4 fL (ref 80.0–100.0)
Platelets: 471 10*3/uL — ABNORMAL HIGH (ref 150–400)
RBC: 4.4 MIL/uL (ref 3.87–5.11)
RDW: 17.2 % — ABNORMAL HIGH (ref 11.5–15.5)
WBC: 8.7 10*3/uL (ref 4.0–10.5)
nRBC: 0 % (ref 0.0–0.2)

## 2022-05-12 LAB — LIPASE, BLOOD: Lipase: 38 U/L (ref 11–51)

## 2022-05-12 LAB — C DIFFICILE QUICK SCREEN W PCR REFLEX
C Diff antigen: POSITIVE — AB
C Diff toxin: NEGATIVE

## 2022-05-12 MED ORDER — MORPHINE SULFATE (PF) 2 MG/ML IV SOLN
2.0000 mg | INTRAVENOUS | Status: DC | PRN
  Administered 2022-05-13 – 2022-05-15 (×9): 2 mg via INTRAVENOUS
  Filled 2022-05-12 (×9): qty 1

## 2022-05-12 MED ORDER — ONDANSETRON HCL 4 MG/2ML IJ SOLN
4.0000 mg | Freq: Once | INTRAMUSCULAR | Status: AC
  Administered 2022-05-12: 4 mg via INTRAVENOUS
  Filled 2022-05-12: qty 2

## 2022-05-12 MED ORDER — IOHEXOL 300 MG/ML  SOLN
100.0000 mL | Freq: Once | INTRAMUSCULAR | Status: AC | PRN
  Administered 2022-05-12: 100 mL via INTRAVENOUS

## 2022-05-12 MED ORDER — MORPHINE SULFATE (PF) 4 MG/ML IV SOLN
4.0000 mg | Freq: Once | INTRAVENOUS | Status: AC
  Administered 2022-05-12: 4 mg via INTRAVENOUS
  Filled 2022-05-12: qty 1

## 2022-05-12 MED ORDER — SODIUM CHLORIDE 0.9 % IV BOLUS
1000.0000 mL | Freq: Once | INTRAVENOUS | Status: AC
  Administered 2022-05-12: 1000 mL via INTRAVENOUS

## 2022-05-12 MED ORDER — ACETAMINOPHEN 650 MG RE SUPP: 650.0000 mg | Freq: Four times a day (QID) | RECTAL | Status: DC | PRN

## 2022-05-12 MED ORDER — ONDANSETRON HCL 4 MG PO TABS: 4.0000 mg | ORAL_TABLET | Freq: Four times a day (QID) | ORAL | Status: DC | PRN

## 2022-05-12 MED ORDER — INSULIN ASPART 100 UNIT/ML IJ SOLN: 0.0000 [IU] | INTRAMUSCULAR | Status: DC

## 2022-05-12 MED ORDER — SODIUM CHLORIDE 0.9 % IV SOLN: INTRAVENOUS | Status: DC

## 2022-05-12 MED ORDER — HEPARIN SODIUM (PORCINE) 5000 UNIT/ML IJ SOLN
5000.0000 [IU] | Freq: Three times a day (TID) | INTRAMUSCULAR | Status: DC
  Administered 2022-05-13 – 2022-05-14 (×5): 5000 [IU] via SUBCUTANEOUS
  Filled 2022-05-12 (×5): qty 1

## 2022-05-12 MED ORDER — ONDANSETRON HCL 4 MG/2ML IJ SOLN
4.0000 mg | Freq: Four times a day (QID) | INTRAMUSCULAR | Status: DC | PRN
  Administered 2022-05-13 – 2022-05-15 (×5): 4 mg via INTRAVENOUS
  Filled 2022-05-12 (×5): qty 2

## 2022-05-12 MED ORDER — ACETAMINOPHEN 325 MG PO TABS: 650.0000 mg | ORAL_TABLET | Freq: Four times a day (QID) | ORAL | Status: DC | PRN

## 2022-05-12 MED ORDER — PANTOPRAZOLE SODIUM 40 MG IV SOLR
40.0000 mg | Freq: Two times a day (BID) | INTRAVENOUS | Status: DC
  Administered 2022-05-13 – 2022-05-14 (×4): 40 mg via INTRAVENOUS
  Filled 2022-05-12 (×4): qty 10

## 2022-05-12 NOTE — ED Notes (Signed)
Pt unable to obtain clean urine sample as it was contaminated with stool.

## 2022-05-12 NOTE — ED Triage Notes (Signed)
Pt reports she has had 2 recent admissions for her appendectomy but she still continues to have nausea and umbilical pain.

## 2022-05-12 NOTE — ED Notes (Signed)
Patient transported to CT 

## 2022-05-12 NOTE — ED Provider Notes (Signed)
[facility] Provider Note   CSN: [id number] Arrival date & time: 05/12/22  1703     History {Add pertinent medical, surgical, social history, OB history to HPI:1} Chief Complaint  Patient presents with   Abdominal Pain    Yesenia [name] is a 72 y.o. female.  He was recently admitted for perforated appendectomy.  She had worsening abdominal pain following this and was readmitted to the hospital on January 29 and was discharged on 2/3.  They did not see an obvious abscess.  She is on Augmentin.  She said her abdominal pain started worsening yesterday and rates it as severe.  It is associated with nausea and frequent diarrhea.  No clear fever but has felt hot and cold.  The history is provided by the patient.  Abdominal Pain Pain location:  Epigastric Pain quality: aching   Pain severity:  Severe Onset quality:  Gradual Duration:  2 days Timing:  Constant Progression:  Unchanged Chronicity:  Recurrent Relieved by:  Nothing Worsened by:  Nothing Ineffective treatments:  Lying down Associated symptoms: chills, diarrhea and nausea   Associated symptoms: no chest pain, no dysuria, no fever and no shortness of breath        Home Medications Prior to Admission medications   Medication Sig Start Date End Date Taking? Authorizing Provider  amitriptyline (ELAVIL) 10 MG tablet Take 10 mg by mouth at bedtime.    [provider]  amLODipine (NORVASC) 2.5 MG tablet Take 2.5 mg by mouth daily.    [provider]  amoxicillin-clavulanate (AUGMENTIN) 500-125 MG tablet Take 1 tablet by mouth 3 (three) times daily. 05/09/22   Aviva Signs, MD  Blood Glucose Monitoring Suppl (BLOOD GLUCOSE SYSTEM PAK) KIT Please dispense as Accu Chek Guide. USE TO TEST BLOOD SUGAR 3 TIMES DAILY. Dx: E11.65. 01/23/20   [name], MD  calcium carbonate (TUMS) 500 MG chewable tablet Chew 1 tablet (200 mg of elemental calcium total) by mouth 2  (two) times daily. Patient not taking: Reported on 05/05/2022 08/09/20   Roxan Hockey, MD  cholecalciferol (VITAMIN D) 25 MCG (1000 UNIT) tablet TAKE (1) TABLET BY MOUTH ONCE DAILY. Patient taking differently: Take 1,000 Units by mouth daily. 06/24/20   [name], MD  dexlansoprazole (DEXILANT) 60 MG capsule TAKE 1 CAPSULE DAILY. Patient taking differently: Take 60 mg by mouth daily. 03/18/22   Rourk, Cristopher Estimable, MD  docusate sodium (COLACE) 100 MG capsule Take 1 capsule (100 mg total) by mouth 2 (two) times daily. 04/26/22 04/26/23  Pappayliou, Barnetta Chapel A, DO  DULoxetine (CYMBALTA) 30 MG capsule Take 30 mg by mouth every evening. Q PM  Take in addition to '60mg'$  Q AM    [provider]  furosemide (LASIX) 40 MG tablet TAKE (1) TABLET BY MOUTH ONCE DAILY AS NEEDED. Patient taking differently: Take 40 mg by mouth daily as needed for edema. 06/24/20   [name], MD  Glucose Blood (BLOOD GLUCOSE TEST STRIPS) STRP Please dispense as Accu Chek Guide. USE TO TEST BLOOD SUGAR 3 TIMES DAILY. Dx: E11.65 01/23/20   [name], MD  HYDROcodone-acetaminophen (NORCO) 5-325 MG tablet Take 1 tablet by mouth every 6 (six) hours as needed for moderate pain. 05/09/22   Aviva Signs, MD  Lancet Devices MISC Please dispense based on patient and insurance preference. Monitor FSBS 3x daily. Dx: E11.65 09/13/17   [name], MD  Lancets MISC Please dispense as Accu Chek Guide. USE  TO TEST BLOOD SUGAR 3 TIMES DAILY. Dx: E11.65. 01/23/20   [name], MD  levothyroxine (SYNTHROID) 150 MCG tablet TAKE (1) TABLET BY MOUTH ONCE DAILY BEFORE BREAKFAST. Patient taking differently: Take 150 mcg by mouth daily before breakfast. 04/03/20   [name], MD  linaclotide [facility]) 290 MCG CAPS capsule TAKE 1 CAPSULE ONCE DAILY BEFORE BREAKFAST Patient taking differently: Take 290 mcg by mouth daily before breakfast. 02/18/22   [name], NP  losartan (COZAAR) 25 MG tablet Take 1 tablet  by mouth daily. 12/20/21   [provider]  metFORMIN (GLUCOPHAGE) 500 MG tablet Take 500 mg by mouth daily.    [provider]  metoprolol succinate (TOPROL-XL) 50 MG 24 hr tablet Take 50 mg by mouth daily.    [provider]  ondansetron (ZOFRAN) 4 MG tablet Take 1 tablet (4 mg total) by mouth every 8 (eight) hours as needed for nausea or vomiting. 04/30/22   Aviva Signs, MD  traZODone (DESYREL) 100 MG tablet Take 100 mg by mouth at bedtime.    [provider]      Allergies    Aspirin and Nalbuphine    Review of Systems   Review of Systems  Constitutional:  Positive for chills. Negative for fever.  Respiratory:  Negative for shortness of breath.   Cardiovascular:  Negative for chest pain.  Gastrointestinal:  Positive for abdominal pain, diarrhea and nausea.  Genitourinary:  Negative for dysuria.    Physical Exam Updated Vital Signs BP (!) 144/91 (BP Location: Right Arm)   Pulse 89   Temp 98 F (36.7 C) (Oral)   Resp 18   Ht '5\' 2"'$  (1.575 m)   Wt 120.2 kg   SpO2 95%   BMI 48.47 kg/m  Physical Exam Vitals and nursing note reviewed.  Constitutional:      General: She is not in acute distress.    Appearance: Normal appearance. She is well-developed.  HENT:     Head: Normocephalic and atraumatic.  Eyes:     Conjunctiva/sclera: Conjunctivae normal.  Cardiovascular:     Rate and Rhythm: Normal rate and regular rhythm.     Heart sounds: No murmur heard. Pulmonary:     Effort: Pulmonary effort is normal. No respiratory distress.     Breath sounds: Normal breath sounds.  Abdominal:     Palpations: Abdomen is soft.     Tenderness: There is abdominal tenderness in the epigastric area. There is no guarding or rebound.  Musculoskeletal:        General: No swelling.     Cervical back: Neck supple.  Skin:    General: Skin is warm and dry.     Capillary Refill: Capillary refill takes less than 2 seconds.  Neurological:     General: No focal  deficit present.     Mental Status: She is alert.  Psychiatric:        Mood and Affect: Mood normal.     ED Results / Procedures / Treatments   Labs (all labs ordered are listed, but only abnormal results are displayed) Labs Reviewed  C DIFFICILE QUICK SCREEN W PCR REFLEX   - Abnormal; Notable for the following components:      Result Value   C Diff antigen POSITIVE (*)    All other components within normal limits  COMPREHENSIVE METABOLIC PANEL - Abnormal; Notable for the following components:   Glucose, Bld 119 (*)    Total Protein 8.3 (*)  Albumin 3.3 (*)    All other components within normal limits  CBC - Abnormal; Notable for the following components:   Hemoglobin 10.6 (*)    MCH 24.1 (*)    MCHC 27.9 (*)    RDW 17.2 (*)    Platelets 471 (*)    All other components within normal limits  GASTROINTESTINAL PANEL BY PCR, STOOL (REPLACES STOOL CULTURE)  CLOSTRIDIUM DIFFICILE BY PCR, REFLEXED  LIPASE, BLOOD  URINALYSIS, ROUTINE W REFLEX MICROSCOPIC  COMPREHENSIVE METABOLIC PANEL  MAGNESIUM  CBC WITH DIFFERENTIAL/PLATELET  URINALYSIS, ROUTINE W REFLEX MICROSCOPIC    EKG None  Radiology CT Abdomen Pelvis W Contrast  Result Date: 05/12/2022 CLINICAL DATA:  Abdomen pain nausea EXAM: CT ABDOMEN AND PELVIS WITH CONTRAST TECHNIQUE: Multidetector CT imaging of the abdomen and pelvis was performed using the standard protocol following bolus administration of intravenous contrast. RADIATION DOSE REDUCTION: This exam was performed according to the departmental dose-optimization program which includes automated exposure control, adjustment of the mA and/or kV according to patient size and/or use of iterative reconstruction technique. CONTRAST:  140m OMNIPAQUE IOHEXOL 300 MG/ML  SOLN COMPARISON:  CT 05/07/2022, 05/04/2022, 04/19/2022 FINDINGS: Lower chest: Lung bases demonstrate no acute airspace disease. Cardiomegaly. Moderate fluid-filled hiatal hernia. Hepatobiliary: Status post  cholecystectomy. No focal hepatic abnormality or biliary dilatation Pancreas: Unremarkable. No pancreatic ductal dilatation or surrounding inflammatory changes. Spleen: Normal in size without focal abnormality. Adrenals/Urinary Tract: Left adrenal gland is normal. Stable 4 cm right adrenal myelolipoma, no imaging follow-up is recommended. Simple cyst upper pole right kidney, no imaging follow-up recommended. Subcentimeter hypodensities too small to further characterize. No hydronephrosis. The bladder is decompressed Stomach/Bowel: Moderate fluid distension of the stomach. Multiple dilated fluid-filled loops of proximal and mid small bowel measuring to 4.3 cm. Transition to decompressed distal small bowel in the right lower quadrant, findings felt consistent with bowel obstruction. Patient is post appendectomy. Left colon diverticula without acute wall thickening. Vascular/Lymphatic: Mild aortic atherosclerosis. No aneurysm. No suspicious lymph nodes. Reproductive: Status post hysterectomy. No adnexal masses. Other: Negative for pelvic effusion.  No free air. Musculoskeletal: Posterior spinal rods and fixating screws at L4-L5 with interbody device. Mild chronic superior endplate deformity at L2 and mild chronic wedging T11. IMPRESSION: 1. Interval increase in fluid-filled distended small bowel with transition to decompressed distal small bowel in the right lower quadrant, consistent with small bowel obstruction. Terminal ileum and colon are decompressed. 2. Left colon diverticular disease without acute wall thickening. Moderate fluid-filled hiatal hernia 3. Stable 4 cm right adrenal myelolipoma, no imaging follow-up is recommended. 4. Aortic atherosclerosis. Aortic Atherosclerosis (ICD10-I70.0). Electronically Signed   By: KDonavan FoilM.D.   On: 05/12/2022 22:16    Procedures Procedures  {Document cardiac monitor, telemetry assessment procedure when appropriate:1}  Medications Ordered in ED Medications   pantoprazole (PROTONIX) injection 40 mg (has no administration in time range)  insulin aspart (novoLOG) injection 0-15 Units (has no administration in time range)  heparin injection 5,000 Units (has no administration in time range)  0.9 %  sodium chloride infusion (has no administration in time range)  acetaminophen (TYLENOL) tablet 650 mg (has no administration in time range)    Or  acetaminophen (TYLENOL) suppository 650 mg (has no administration in time range)  morphine (PF) 2 MG/ML injection 2 mg (has no administration in time range)  ondansetron (ZOFRAN) tablet 4 mg (has no administration in time range)    Or  ondansetron (ZOFRAN) injection 4 mg (has no administration in time range)  morphine (PF) 4 MG/ML injection 4 mg (4 mg Intravenous Given 05/12/22 2110)  ondansetron (ZOFRAN) injection 4 mg (4 mg Intravenous Given 05/12/22 2110)  sodium chloride 0.9 % bolus 1,000 mL (0 mLs Intravenous Stopped 05/12/22 2129)  iohexol (OMNIPAQUE) 300 MG/ML solution 100 mL (100 mLs Intravenous Contrast Given 05/12/22 2144)    ED Course/ Medical Decision Making/ A&P Clinical Course as of 05/12/22 2331  Tue May 12, 2022  2248 Discussed with Dr. Okey Dupre general surgery.  She did not feel any intervention needed needed at this time and recommend admit to medicine.  Discussed with [facility] as well as Dr. Josph Macho will evaluate patient for admission. [MB]    Clinical Course User Index [MB] Hayden Rasmussen, MD   {   Click here for ABCD2, HEART and other calculatorsREFRESH Note before signing :1}                          Medical Decision Making Amount and/or Complexity of Data Reviewed Labs: ordered. Radiology: ordered.  Risk Prescription drug management. Decision regarding hospitalization.   This patient complains of worsening abdominal pain nausea diarrhea; this involves an extensive number of treatment Options and is a complaint that carries with it a high risk of complications and morbidity. The  differential includes abscess, obstruction, perforation, infection, metabolic derangement  I ordered, reviewed and interpreted labs, which included CBC with normal white count, hemoglobin stable, chemistries normal bicarb, C. difficile antigen positive toxin negative, sent for reflex, stool panel pending, I ordered medication IV fluids and nausea medication pain medication and reviewed PMP when indicated. I ordered imaging studies which included CT abdomen and pelvis and I independently    visualized and interpreted imaging which showed dilated small bowel loops concerning for bowel obstruction Previous records obtained and reviewed in epic including recent discharge summary I consulted general surgery Dr. Okey Dupre and [facility] hospitalist Dr. Josph Macho And discussed lab and imaging findings and discussed disposition.  Cardiac monitoring reviewed, normal sinus rhythm Social determinants considered, no significant barriers Critical Interventions: None  After the interventions stated above, I reevaluated the patient and found patient's pain to be improved Admission and further testing considered, she would benefit from mission to the hospital for continued management and workup of her symptoms.  Patient in agreement with plan for admission.   {Document critical care time when appropriate:1} {Document review of labs and clinical decision tools ie heart score, Chads2Vasc2 etc:1}  {Document your independent review of radiology images, and any outside records:1} {Document your discussion with family members, caretakers, and with consultants:1} {Document social determinants of health affecting pt's care:1} {Document your decision making why or why not admission, treatments were needed:1} Final Clinical Impression(s) / ED Diagnoses Final diagnoses:  None    Rx / DC Orders ED Discharge Orders     None

## 2022-05-13 ENCOUNTER — Inpatient Hospital Stay (HOSPITAL_COMMUNITY): Payer: Medicare HMO

## 2022-05-13 DIAGNOSIS — R109 Unspecified abdominal pain: Secondary | ICD-10-CM

## 2022-05-13 DIAGNOSIS — K56609 Unspecified intestinal obstruction, unspecified as to partial versus complete obstruction: Secondary | ICD-10-CM

## 2022-05-13 DIAGNOSIS — I10 Essential (primary) hypertension: Secondary | ICD-10-CM | POA: Diagnosis not present

## 2022-05-13 DIAGNOSIS — R197 Diarrhea, unspecified: Secondary | ICD-10-CM | POA: Diagnosis not present

## 2022-05-13 DIAGNOSIS — E1149 Type 2 diabetes mellitus with other diabetic neurological complication: Secondary | ICD-10-CM

## 2022-05-13 LAB — CBC WITH DIFFERENTIAL/PLATELET
Abs Immature Granulocytes: 0.03 10*3/uL (ref 0.00–0.07)
Basophils Absolute: 0 10*3/uL (ref 0.0–0.1)
Basophils Relative: 1 %
Eosinophils Absolute: 0.1 10*3/uL (ref 0.0–0.5)
Eosinophils Relative: 2 %
HCT: 33.3 % — ABNORMAL LOW (ref 36.0–46.0)
Hemoglobin: 9.5 g/dL — ABNORMAL LOW (ref 12.0–15.0)
Immature Granulocytes: 1 %
Lymphocytes Relative: 18 %
Lymphs Abs: 1.2 10*3/uL (ref 0.7–4.0)
MCH: 24.3 pg — ABNORMAL LOW (ref 26.0–34.0)
MCHC: 28.5 g/dL — ABNORMAL LOW (ref 30.0–36.0)
MCV: 85.2 fL (ref 80.0–100.0)
Monocytes Absolute: 0.5 10*3/uL (ref 0.1–1.0)
Monocytes Relative: 8 %
Neutro Abs: 4.5 10*3/uL (ref 1.7–7.7)
Neutrophils Relative %: 70 %
Platelets: 372 10*3/uL (ref 150–400)
RBC: 3.91 MIL/uL (ref 3.87–5.11)
RDW: 17 % — ABNORMAL HIGH (ref 11.5–15.5)
WBC: 6.3 10*3/uL (ref 4.0–10.5)
nRBC: 0 % (ref 0.0–0.2)

## 2022-05-13 LAB — COMPREHENSIVE METABOLIC PANEL
ALT: 8 U/L (ref 0–44)
AST: 14 U/L — ABNORMAL LOW (ref 15–41)
Albumin: 2.7 g/dL — ABNORMAL LOW (ref 3.5–5.0)
Alkaline Phosphatase: 55 U/L (ref 38–126)
Anion gap: 8 (ref 5–15)
BUN: 10 mg/dL (ref 8–23)
CO2: 25 mmol/L (ref 22–32)
Calcium: 9.2 mg/dL (ref 8.9–10.3)
Chloride: 104 mmol/L (ref 98–111)
Creatinine, Ser: 0.82 mg/dL (ref 0.44–1.00)
GFR, Estimated: >60 mL/min (ref >60)
Glucose, Bld: 118 mg/dL — ABNORMAL HIGH (ref 70–99)
Potassium: 3.6 mmol/L (ref 3.5–5.1)
Sodium: 137 mmol/L (ref 135–145)
Total Bilirubin: 0.4 mg/dL (ref 0.3–1.2)
Total Protein: 6.7 g/dL (ref 6.5–8.1)

## 2022-05-13 LAB — CLOSTRIDIUM DIFFICILE BY PCR, REFLEXED: Toxigenic C. Difficile by PCR: NEGATIVE

## 2022-05-13 LAB — CBG MONITORING, ED
Glucose-Capillary: 116 mg/dL — ABNORMAL HIGH (ref 70–99)
Glucose-Capillary: 99 mg/dL (ref 70–99)

## 2022-05-13 LAB — GLUCOSE, CAPILLARY
Glucose-Capillary: 88 mg/dL (ref 70–99)
Glucose-Capillary: 87 mg/dL (ref 70–99)
Glucose-Capillary: 91 mg/dL (ref 70–99)
Glucose-Capillary: 106 mg/dL — ABNORMAL HIGH (ref 70–99)

## 2022-05-13 LAB — MAGNESIUM: Magnesium: 1.7 mg/dL (ref 1.7–2.4)

## 2022-05-13 MED ORDER — ORAL CARE MOUTH RINSE: 15.0000 mL | OROMUCOSAL | Status: DC | PRN

## 2022-05-13 MED ORDER — INFLUENZA VAC A&B SA ADJ QUAD 0.5 ML IM PRSY
0.5000 mL | PREFILLED_SYRINGE | INTRAMUSCULAR | Status: AC
  Administered 2022-05-14: 0.5 mL via INTRAMUSCULAR
  Filled 2022-05-13: qty 0.5

## 2022-05-13 MED ORDER — SODIUM CHLORIDE 0.9 % IV SOLN
3.0000 g | Freq: Three times a day (TID) | INTRAVENOUS | Status: DC
  Administered 2022-05-13 – 2022-05-15 (×7): 3 g via INTRAVENOUS
  Filled 2022-05-13 (×7): qty 8

## 2022-05-13 NOTE — Assessment & Plan Note (Signed)
-   Continue amlodipine, metoprolol - Continue to monitor
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
## 2022-05-13 NOTE — Assessment & Plan Note (Signed)
-   Continue sliding scale coverage
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
## 2022-05-13 NOTE — ED Notes (Signed)
Dr. Arnoldo Morale in room to speak with patient at this time.

## 2022-05-13 NOTE — ED Notes (Signed)
Pt oxygen sat dropped to 78% on RA while sleeping, placed on 2L [place]. Pt reports she was just started on home oxygen but only uses as needed. O2 now 97%.

## 2022-05-13 NOTE — ED Notes (Signed)
Patient complaining of nausea. Continues to decline placement of NG tube at this time. Zofran administered per PRN order.

## 2022-05-13 NOTE — Assessment & Plan Note (Addendum)
-   2 loose stools per day - Nonbloody - Likely due to Augmentin - C. difficile antigen present but not toxin>> reflex to PCR

## 2022-05-13 NOTE — Progress Notes (Signed)
Patient seen and examined, admitted earlier this morning, please see H&P for details briefly 71/F with history of anxiety, DVT, hypertension, GERD, hypothyroidism, fatty liver, history of PE was admitted on 1/14 with perforated appendicitis, abscess, she underwent laparoscopic appendectomy by Dr. Arnoldo Morale 1/15, discharged on 1/21 -Readmitted 1/29-2/3 with abdominal pain, small phlegmon post appendectomy, treated with supportive care, antibiotics. -Back in the ED with abdominal pain and nausea, repeat CT with dilated small bowel, concerning for SBO, hiatal hernia.  Abdominal pain, likely ileus -Clinically suspect ileus, CT does raise his concern for dilated small bowel with transition zone in the right lower quadrant -General surgery consulting, 1 episode of vomiting yesterday evening, none since then, she would like to hold off on NG tube placement at this time, will need NG decompression if she has recurrence of N/V -Continue IV Unasyn today, n.p.o., supportive care -Increase activity, ambulate  Rest of medical problems are stable, labs unremarkable  Domenic Polite, MD

## 2022-05-13 NOTE — Assessment & Plan Note (Signed)
-   CT scan shows SBO - Recent abdominal surgery related to appendix and then phlegmon - N.p.o. - NG tube ordered but patient is currently refusing - Pain control with pain scale - Continue to monitor

## 2022-05-13 NOTE — TOC Initial Note (Signed)
Transition of Care [facility]) - Initial/Assessment Note    Patient Details  Name: Yesenia [name] MRN: [id number] Date of Birth: Apr 16, 1950  [facility]) CM/SW Contact:    Iona Beard, Maynard Phone Number: 05/13/2022, 12:39 PM  Clinical Narrative:                 [facility] consulted for SNF placement. CSW spoke with pt about interest in SNF if recommended by PT once PT eval was ordered and completed. Pt states that she does not feel that this would be needed. Pt states that her son lives with her and is in the home to assist 24/7. CSW inquired about pts interest in [facility] with [facility] being set up as she has used this agency in the past. Pt states that she feels this is not needed as she is able to manage well in the home. CSW explained that this can be set up if pt changes her mind before D/C. CSW also explained that pts PCP would be able to set [facility] up if she got home and felt this was needed. TOC to follow.   Expected Discharge Plan: Home/Self Care Barriers to Discharge: Continued Medical Work up   Patient Goals and CMS Choice Patient states their goals for this hospitalization and ongoing recovery are:: return home CMS Medicare.gov Compare Post Acute Care list provided to:: Patient Choice offered to / list presented to : Patient      Expected Discharge Plan and Services In-house Referral: Clinical Social Work Discharge Planning Services: CM Consult   Living arrangements for the past 2 months: Single Family Home                                      Prior Living Arrangements/Services Living arrangements for the past 2 months: Single Family Home Lives with:: Adult Children Patient language and need for interpreter reviewed:: Yes Do you feel safe going back to the place where you live?: Yes      Need for Family Participation in Patient Care: Yes (Comment) Care giver support system in place?: Yes (comment)   Criminal Activity/Legal Involvement Pertinent to Current  Situation/Hospitalization: No - Comment as needed  Activities of Daily Living Home Assistive Devices/Equipment: Eyeglasses, Oxygen ADL Screening (condition at time of admission) Patient's cognitive ability adequate to safely complete daily activities?: Yes Is the patient deaf or have difficulty hearing?: No Does the patient have difficulty seeing, even when wearing glasses/contacts?: No Does the patient have difficulty concentrating, remembering, or making decisions?: No Patient able to express need for assistance with ADLs?: Yes Does the patient have difficulty dressing or bathing?: No Independently performs ADLs?: Yes (appropriate for developmental age) Does the patient have difficulty walking or climbing stairs?: No Weakness of Legs: None Weakness of Arms/Hands: None  Permission Sought/Granted                  Emotional Assessment Appearance:: Appears stated age Attitude/Demeanor/Rapport: Engaged Affect (typically observed): Accepting Orientation: : Oriented to Self, Oriented to Place, Oriented to  Time, Oriented to Situation Alcohol / Substance Use: Not Applicable Psych Involvement: No (comment)  Admission diagnosis:  Abdominal pain, generalized [R10.84] Small bowel obstruction (HCC) [K56.609] Acute diarrhea [R19.7] Intractable abdominal pain [R10.9] Patient Active Problem List   Diagnosis Date Noted   Diarrhea 05/13/2022   Intractable abdominal pain 05/12/2022   Hypoalbuminemia due to protein-calorie malnutrition ([place]) 05/05/2022  Abdominal pain 05/04/2022   Sepsis ([place]) 04/20/2022   Perforated appendicitis 04/19/2022   Acute appendicitis with appendiceal abscess 04/19/2022   Chest pain 08/09/2020   Low back pain 07/24/2020   Thigh pain 03/06/2020   Heart murmur 84/66/5993   Diastolic dysfunction 57/04/7791   Hypokalemia 12/24/2016   Osteoporosis 02/18/2016   Proximal humerus fracture 01/12/2016   Fatty liver 09/04/2015   SBO (small bowel obstruction) (HCC)     Neurotic excoriations 05/14/2015   Constipation 05/09/2015   History of colonic polyps    Diverticulosis of colon without hemorrhage    Mucosal abnormality of stomach    Mucosal abnormality of esophagus    Reflux esophagitis    Dysphagia    FH: colon cancer 12/19/2014   Hyperlipemia 09/06/2013   MDD (major depressive disorder) 09/06/2013   Previous back surgery 11/04/2012   DVT (deep venous thrombosis) ([place])    PE (pulmonary embolism)    CKD (chronic kidney disease), stage II 05/17/2012   Diabetic neuropathy ([place]) 03/18/2012   Sleep apnea 10/28/2011   Type II diabetes mellitus with neurological manifestations ([place]) 10/28/2011   Fibromyalgia 10/20/2011   Essential hypertension 09/08/2011   Acquired hypothyroidism 09/08/2011   DDD (degenerative disc disease), lumbar 09/08/2011   Obesity, Class III, BMI 40-49.9 (morbid obesity) ([place]) 09/08/2011   Anxiety 09/08/2011   GERD 01/08/2010   SPINAL STENOSIS, LUMBAR 09/16/2009   PCP:  Johnette Abraham, MD Pharmacy:   [facility], [place] [address] [place] [address] Phone: [phone] Fax: [phone]     Social Determinants of Health (SDOH) Social History: SDOH Screenings   Food Insecurity: No Food Insecurity (05/13/2022)  Housing: Low Risk  (05/13/2022)  Transportation Needs: No Transportation Needs (05/13/2022)  Utilities: Not At Risk (05/13/2022)  Alcohol Screen: Low Risk  (05/22/2020)  Depression (PHQ2-9): Medium Risk (05/22/2020)  Financial Resource Strain: Low Risk  (02/21/2020)  Tobacco Use: Low Risk  (05/12/2022)   SDOH Interventions:     Readmission Risk Interventions    05/13/2022   12:12 PM 05/06/2022    9:01 AM 04/23/2022    2:39 PM  Readmission Risk Prevention Plan  Post Dischage Appt   Not Complete  Medication Screening Complete  Complete  Transportation Screening Complete Complete Complete  PCP or Specialist Appt within 5-7 Days  Complete   Home Care Screening   Complete   Medication Review (RN CM)  Complete

## 2022-05-13 NOTE — ED Notes (Signed)
Pt requesting to speak with hospitalist before insertion of NG tube.

## 2022-05-13 NOTE — Assessment & Plan Note (Signed)
-   Patient last discharged from the hospital on February 3 with a 10-day course of Augmentin - Not hospitalization was for a phlegmon in the right lower quadrant - The pain is again worsening - CT abdomen today shows interval increase in fluid-filled distended small bowel with transition zone in right lower quadrant - Continue pain control with pain scale - Continue supportive care with antiemetics and fluids - Consult general surgery

## 2022-05-14 ENCOUNTER — Encounter: Payer: Medicare HMO | Admitting: General Surgery

## 2022-05-14 ENCOUNTER — Inpatient Hospital Stay (HOSPITAL_COMMUNITY): Payer: Medicare HMO

## 2022-05-14 LAB — GASTROINTESTINAL PANEL BY PCR, STOOL (REPLACES STOOL CULTURE)

## 2022-05-14 LAB — CBC
HCT: 29.6 % — ABNORMAL LOW (ref 36.0–46.0)
Hemoglobin: 8.3 g/dL — ABNORMAL LOW (ref 12.0–15.0)
MCH: 24.2 pg — ABNORMAL LOW (ref 26.0–34.0)
MCHC: 28 g/dL — ABNORMAL LOW (ref 30.0–36.0)
MCV: 86.3 fL (ref 80.0–100.0)
Platelets: 300 10*3/uL (ref 150–400)
RBC: 3.43 MIL/uL — ABNORMAL LOW (ref 3.87–5.11)
RDW: 17.2 % — ABNORMAL HIGH (ref 11.5–15.5)
WBC: 5 10*3/uL (ref 4.0–10.5)
nRBC: 0 % (ref 0.0–0.2)

## 2022-05-14 LAB — BASIC METABOLIC PANEL
Anion gap: 8 (ref 5–15)
BUN: 8 mg/dL (ref 8–23)
CO2: 27 mmol/L (ref 22–32)
Calcium: 8.3 mg/dL — ABNORMAL LOW (ref 8.9–10.3)
Chloride: 104 mmol/L (ref 98–111)
Creatinine, Ser: 0.75 mg/dL (ref 0.44–1.00)
GFR, Estimated: >60 mL/min (ref >60)
Glucose, Bld: 94 mg/dL (ref 70–99)
Potassium: 3.1 mmol/L — ABNORMAL LOW (ref 3.5–5.1)
Sodium: 139 mmol/L (ref 135–145)

## 2022-05-14 LAB — GLUCOSE, CAPILLARY
Glucose-Capillary: 101 mg/dL — ABNORMAL HIGH (ref 70–99)
Glucose-Capillary: 86 mg/dL (ref 70–99)
Glucose-Capillary: 86 mg/dL (ref 70–99)
Glucose-Capillary: 67 mg/dL — ABNORMAL LOW (ref 70–99)
Glucose-Capillary: 95 mg/dL (ref 70–99)
Glucose-Capillary: 90 mg/dL (ref 70–99)

## 2022-05-14 MED ORDER — KCL IN DEXTROSE-NACL 40-5-0.45 MEQ/L-%-% IV SOLN: INTRAVENOUS | Status: DC

## 2022-05-14 MED ORDER — ENOXAPARIN SODIUM 60 MG/0.6ML IJ SOSY
60.0000 mg | PREFILLED_SYRINGE | INTRAMUSCULAR | Status: DC
  Administered 2022-05-14 – 2022-05-15 (×2): 60 mg via SUBCUTANEOUS
  Filled 2022-05-14 (×2): qty 0.6

## 2022-05-14 MED ORDER — PANTOPRAZOLE SODIUM 40 MG PO TBEC
40.0000 mg | DELAYED_RELEASE_TABLET | Freq: Every day | ORAL | Status: DC
  Administered 2022-05-15: 40 mg via ORAL
  Filled 2022-05-14: qty 1

## 2022-05-14 MED ORDER — DEXTROSE 50 % IV SOLN
25.0000 mL | INTRAVENOUS | Status: AC
  Administered 2022-05-14: 25 mL via INTRAVENOUS
  Filled 2022-05-14: qty 50

## 2022-05-14 MED ORDER — DIATRIZOATE MEGLUMINE & SODIUM 66-10 % PO SOLN
90.0000 mL | Freq: Once | ORAL | Status: AC
  Administered 2022-05-14: 90 mL via NASOGASTRIC
  Filled 2022-05-14: qty 90

## 2022-05-14 MED ORDER — POTASSIUM CHLORIDE 10 MEQ/100ML IV SOLN
10.0000 meq | INTRAVENOUS | Status: AC
  Administered 2022-05-14 (×3): 10 meq via INTRAVENOUS
  Filled 2022-05-14 (×3): qty 100

## 2022-05-14 NOTE — Consult Note (Signed)
   [facility] Inpatient Consult   05/14/2022  BREELLA VANOSTRAND 05-28-1950 [id number]  [employer] [ACO] Patient: [facility] Liaison remote coverage review for patient admitted to [facility]    Primary Care Provider:  Johnette Abraham, MD with [facility]    Patient screened for hospitalization with noted high risk score for unplanned readmission risk and to assess for potential [place] Management service needs for post hospital transition for care coordination.  Review of patient's electronic medical record reveals patient is for home with son noted as review of PT eval note.  Patient with readmission, call placed to phone number listed and unable to leave a voicemail regarding any post hospital community needs.   Plan:  Continue to follow progress and disposition to assess for post hospital community care coordination/management needs.  Referral request for community care coordination: has had follow up from [facility] calls as well.  Of note, [facility] Care Management/Population Health does not replace or interfere with any arrangements made by the Inpatient Transition of Care team.  For questions contact:   Natividad Brood, RN BSN [place]  [phone] business mobile phone Toll free office [phone]  *[place]  [phone] Fax number: [phone] [name]

## 2022-05-14 NOTE — Plan of Care (Signed)
  Problem: Pain Managment: Goal: General experience of comfort will improve Outcome: Progressing   

## 2022-05-14 NOTE — Progress Notes (Signed)
Subjective: Patient states she feels better.  She did have a small bowel movement yesterday evening.  She feels less distended.  Objective: Vital signs in last 24 hours: Temp:  [97.6 F (36.4 C)-98.7 F (37.1 C)] 97.6 F (36.4 C) (02/08 0521) Pulse Rate:  [52-83] 74 (02/08 0521) Resp:  [18-20] 19 (02/08 0521) BP: (136-160)/(69-133) 137/71 (02/08 0521) SpO2:  [87 %-100 %] 100 % (02/08 0521) Last BM Date : 05/13/22  Intake/Output from previous day: 02/07 0701 - 02/08 0700 In: 3081.1 [I.V.:2681.1; IV Piggyback:400] Out: -  Intake/Output this shift: No intake/output data recorded.  General appearance: alert, cooperative, and no distress Resp: clear to auscultation bilaterally Cardio: regular rate and rhythm, S1, S2 normal, no murmur, click, rub or gallop GI: Soft, less distended.  Incisions healing well.  Occasional bowel sounds appreciated.  Lab Results:  Recent Labs    05/13/22 0257 05/14/22 0532  WBC 6.3 5.0  HGB 9.5* 8.3*  HCT 33.3* 29.6*  PLT 372 300   BMET Recent Labs    05/13/22 0257 05/14/22 0532  NA 137 139  K 3.6 3.1*  CL 104 104  CO2 25 27  GLUCOSE 118* 94  BUN 10 8  CREATININE 0.82 0.75  CALCIUM 9.2 8.3*   PT/INR No results for input(s): "LABPROT", "INR" in the last 72 hours.  Studies/Results: DG Abd 1 View  Result Date: 05/13/2022 CLINICAL DATA:  72 year old female with history of small-bowel obstruction. EXAM: ABDOMEN - 1 VIEW COMPARISON:  12/18/2021. FINDINGS: Multiple dilated loops of small bowel are noted in the central abdomen measuring up to 5.7 cm in diameter. There is a paucity of colonic gas and stool noted. Iodinated contrast material is noted in the lumen of the urinary bladder. Orthopedic fixation hardware in the lower lumbar spine incidentally noted. No frank pneumoperitoneum confidently identified on these supine images. IMPRESSION: 1. Bowel-gas pattern remains compatible with small-bowel obstruction, as above. Electronically Signed    By: Vinnie Langton M.D.   On: 05/13/2022 06:22   CT Abdomen Pelvis W Contrast  Result Date: 05/12/2022 CLINICAL DATA:  Abdomen pain nausea EXAM: CT ABDOMEN AND PELVIS WITH CONTRAST TECHNIQUE: Multidetector CT imaging of the abdomen and pelvis was performed using the standard protocol following bolus administration of intravenous contrast. RADIATION DOSE REDUCTION: This exam was performed according to the departmental dose-optimization program which includes automated exposure control, adjustment of the mA and/or kV according to patient size and/or use of iterative reconstruction technique. CONTRAST:  165m OMNIPAQUE IOHEXOL 300 MG/ML  SOLN COMPARISON:  CT 05/07/2022, 05/04/2022, 04/19/2022 FINDINGS: Lower chest: Lung bases demonstrate no acute airspace disease. Cardiomegaly. Moderate fluid-filled hiatal hernia. Hepatobiliary: Status post cholecystectomy. No focal hepatic abnormality or biliary dilatation Pancreas: Unremarkable. No pancreatic ductal dilatation or surrounding inflammatory changes. Spleen: Normal in size without focal abnormality. Adrenals/Urinary Tract: Left adrenal gland is normal. Stable 4 cm right adrenal myelolipoma, no imaging follow-up is recommended. Simple cyst upper pole right kidney, no imaging follow-up recommended. Subcentimeter hypodensities too small to further characterize. No hydronephrosis. The bladder is decompressed Stomach/Bowel: Moderate fluid distension of the stomach. Multiple dilated fluid-filled loops of proximal and mid small bowel measuring to 4.3 cm. Transition to decompressed distal small bowel in the right lower quadrant, findings felt consistent with bowel obstruction. Patient is post appendectomy. Left colon diverticula without acute wall thickening. Vascular/Lymphatic: Mild aortic atherosclerosis. No aneurysm. No suspicious lymph nodes. Reproductive: Status post hysterectomy. No adnexal masses. Other: Negative for pelvic effusion.  No free air. Musculoskeletal:  Posterior spinal  rods and fixating screws at L4-L5 with interbody device. Mild chronic superior endplate deformity at L2 and mild chronic wedging T11. IMPRESSION: 1. Interval increase in fluid-filled distended small bowel with transition to decompressed distal small bowel in the right lower quadrant, consistent with small bowel obstruction. Terminal ileum and colon are decompressed. 2. Left colon diverticular disease without acute wall thickening. Moderate fluid-filled hiatal hernia 3. Stable 4 cm right adrenal myelolipoma, no imaging follow-up is recommended. 4. Aortic atherosclerosis. Aortic Atherosclerosis (ICD10-I70.0). Electronically Signed   By: Donavan Foil M.D.   On: 05/12/2022 22:16    Anti-infectives: Anti-infectives (From admission, onward)    Start     Dose/Rate Route Frequency Ordered Stop   05/13/22 0600  Ampicillin-Sulbactam (UNASYN) 3 g in sodium chloride 0.9 % 100 mL IVPB        3 g 200 mL/hr over 30 Minutes Intravenous Every 8 hours 05/13/22 0523         Assessment/Plan: Impression: Failure to thrive, postoperative ileus versus partial small bowel obstruction.  Hypokalemia. Plan: Will get small bowel bowel obstruction protocol study today.  Further management is pending those results.  Will supplement potassium.  Will transfer patient to the surgical service.  Aware of patient's refusal to a skilled nursing unit.  LOS: 2 days    Aviva Signs 05/14/2022

## 2022-05-14 NOTE — Progress Notes (Signed)
Patient transferred to general surgery service.  Discussed with Dr. Arnoldo Morale.  He will consult medicine if necessary.  Gerlean Ren MD

## 2022-05-14 NOTE — Evaluation (Signed)
Physical Therapy Evaluation Patient Details Name: Yesenia [name] MRN: [id number] DOB: [birth year] Today's Date: 05/14/2022  History of Present Illness  Yesenia [name] is a 72 y.o. female with medical history significant of anxiety, DVT, hypertension, GERD, hypothyroidism, fatty liver, PE, and more presents the ED with a chief complaint of abdominal pain.  Patient reports this is her third admission since January 15.  At that time she had an appendix rupture per her report.  She came back a couple days after discharge because she had increased pain and they did a CT scan which showed phlegmon.  Patient was started on antibiotics.  She was discharged on Saturday with Augmentin.  She returns today due to abdominal pain.  She reports she first noticed it the day before when she did not feel right and she had a crampy twisting pain.  She reports the pain was periumbilical.  It was no worse with p.o. intake and completely unpredictable for when it would wax and wane.  Patient reports no history of SBO.  She reports she had intermittent pain at first but it has become worse in frequency and intensity.  Patient reports nausea and vomiting x 1 that was normal bloody and appeared to be yellow.  No blood in her stool.  Patient is admitted for SBO   Clinical Impression  Patient functioning near baseline for functional mobility and gait demonstrating good return for bed mobility, transfers and ambulating in room/hallway without loss of balance without need for an AD.  Patient encouraged to ambulate with nursing staff and family supervising for length of stay.  Plan:  Patient discharged from physical therapy to care of nursing for ambulation daily as tolerated for length of stay.         Recommendations for follow up therapy are one component of a multi-disciplinary discharge planning process, led by the attending physician.  Recommendations may be updated based on patient status, additional functional criteria  and insurance authorization.  Follow Up Recommendations No PT follow up      Assistance Recommended at Discharge Set up Supervision/Assistance  Patient can return home with the following  A little help with walking and/or transfers;A little help with bathing/dressing/bathroom;Help with stairs or ramp for entrance;Assistance with cooking/housework    Equipment Recommendations None recommended by PT  Recommendations for Other Services       Functional Status Assessment Patient has had a recent decline in their functional status and/or demonstrates limited ability to make significant improvements in function in a reasonable and predictable amount of time     Precautions / Restrictions Precautions Precautions: Fall Restrictions Weight Bearing Restrictions: No      Mobility  Bed Mobility Overal bed mobility: Modified Independent                  Transfers Overall transfer level: Modified independent                 General transfer comment: good return for transferring to/from commode in bathroom and chair at bedside    Ambulation/Gait Ambulation/Gait assistance: Modified independent (Device/Increase time), Supervision Gait Distance (Feet): 75 Feet Assistive device: None Gait Pattern/deviations: Decreased step length - right, Decreased step length - left, Decreased stride length Gait velocity: decreased     General Gait Details: slightly labored slow cadence without loss of balance without requiring use of an AD  Stairs            Wheelchair Mobility    Modified Rankin (Stroke Patients  Only)       Balance Overall balance assessment: Needs assistance Sitting-balance support: Feet supported, No upper extremity supported Sitting balance-Leahy Scale: Good Sitting balance - Comments: seated at EOB   Standing balance support: During functional activity, No upper extremity supported Standing balance-Leahy Scale: Fair Standing balance comment:  fair/good without AD                             Pertinent Vitals/Pain Pain Assessment Pain Assessment: No/denies pain    Home Living Family/patient expects to be discharged to:: Private residence Living Arrangements: Children   Type of Home: House Home Access: Stairs to enter   Technical brewer of Steps: 2   Home Layout: One level Home Equipment: Shower seat;Grab bars - tub/shower      Prior Function Prior Level of Function : Independent/Modified Independent             Mobility Comments: Community ambulator without AD, does not drive ADLs Comments: Independent other than son driving for her     Hand Dominance   Dominant Hand: Right    Extremity/Trunk Assessment   Upper Extremity Assessment Upper Extremity Assessment: Overall WFL for tasks assessed    Lower Extremity Assessment Lower Extremity Assessment: Overall WFL for tasks assessed    Cervical / Trunk Assessment Cervical / Trunk Assessment: Normal  Communication   Communication: No difficulties  Cognition Arousal/Alertness: Awake/alert Behavior During Therapy: WFL for tasks assessed/performed Overall Cognitive Status: Within Functional Limits for tasks assessed                                          General Comments      Exercises     Assessment/Plan    PT Assessment Patient does not need any further PT services  PT Problem List         PT Treatment Interventions      PT Goals (Current goals can be found in the Care Plan section)  Acute Rehab PT Goals Patient Stated Goal: return home with family to assist PT Goal Formulation: With patient Time For Goal Achievement: 05/14/22 Potential to Achieve Goals: Good    Frequency       Co-evaluation               AM-PAC PT "6 Clicks" Mobility  Outcome Measure Help needed turning from your back to your side while in a flat bed without using bedrails?: None Help needed moving from lying on your  back to sitting on the side of a flat bed without using bedrails?: None Help needed moving to and from a bed to a chair (including a wheelchair)?: None Help needed standing up from a chair using your arms (e.g., wheelchair or bedside chair)?: None Help needed to walk in hospital room?: None Help needed climbing 3-5 steps with a railing? : A Little 6 Click Score: 23    End of Session   Activity Tolerance: Patient tolerated treatment well;Patient limited by fatigue Patient left: in chair;with call bell/phone within reach Nurse Communication: Mobility status PT Visit Diagnosis: Unsteadiness on feet (R26.81);Other abnormalities of gait and mobility (R26.89);Muscle weakness (generalized) (M62.81)    Time: 1003-1030 PT Time Calculation (min) (ACUTE ONLY): 27 min   Charges:   PT Evaluation $PT Eval Moderate Complexity: 1 Mod PT Treatments $Therapeutic Activity: 23-37 mins  2:09 PM, 05/14/22 Lonell Grandchild, MPT Physical Therapist with [facility] [phone] office [phone] mobile phone

## 2022-05-15 LAB — BASIC METABOLIC PANEL
Anion gap: 7 (ref 5–15)
BUN: 7 mg/dL — ABNORMAL LOW (ref 8–23)
CO2: 25 mmol/L (ref 22–32)
Calcium: 8.3 mg/dL — ABNORMAL LOW (ref 8.9–10.3)
Chloride: 107 mmol/L (ref 98–111)
Creatinine, Ser: 0.75 mg/dL (ref 0.44–1.00)
GFR, Estimated: >60 mL/min (ref >60)
Glucose, Bld: 101 mg/dL — ABNORMAL HIGH (ref 70–99)
Potassium: 3.6 mmol/L (ref 3.5–5.1)
Sodium: 139 mmol/L (ref 135–145)

## 2022-05-15 LAB — CBC
HCT: 30.9 % — ABNORMAL LOW (ref 36.0–46.0)
Hemoglobin: 8.5 g/dL — ABNORMAL LOW (ref 12.0–15.0)
MCH: 23.8 pg — ABNORMAL LOW (ref 26.0–34.0)
MCHC: 27.5 g/dL — ABNORMAL LOW (ref 30.0–36.0)
MCV: 86.6 fL (ref 80.0–100.0)
Platelets: 294 10*3/uL (ref 150–400)
RBC: 3.57 MIL/uL — ABNORMAL LOW (ref 3.87–5.11)
RDW: 17.1 % — ABNORMAL HIGH (ref 11.5–15.5)
WBC: 5.2 10*3/uL (ref 4.0–10.5)
nRBC: 0 % (ref 0.0–0.2)

## 2022-05-15 LAB — GLUCOSE, CAPILLARY
Glucose-Capillary: 109 mg/dL — ABNORMAL HIGH (ref 70–99)
Glucose-Capillary: 112 mg/dL — ABNORMAL HIGH (ref 70–99)
Glucose-Capillary: 80 mg/dL (ref 70–99)
Glucose-Capillary: 85 mg/dL (ref 70–99)

## 2022-05-15 LAB — PHOSPHORUS: Phosphorus: 2.5 mg/dL (ref 2.5–4.6)

## 2022-05-15 LAB — MAGNESIUM: Magnesium: 1.7 mg/dL (ref 1.7–2.4)

## 2022-05-15 NOTE — Care Management Important Message (Signed)
Important Message  Patient Details  Name: Yesenia [name] MRN: [id number] Date of Birth: 1950-09-18   Medicare Important Message Given:  N/A - LOS <3 / Initial given by admissions     Tommy Medal 05/15/2022, 1:34 PM

## 2022-05-15 NOTE — Plan of Care (Signed)
  Problem: Nutrition: Goal: Adequate nutrition will be maintained Outcome: Progressing   

## 2022-05-15 NOTE — Discharge Summary (Signed)
Physician Discharge Summary  Patient ID: Yesenia [name] MRN: [id number] DOB/AGE: 09/08/1950 72 y.o.  Admit date: 05/12/2022 Discharge date: 05/15/2022  Admission Diagnoses: Failure to thrive, nausea, vomiting  Discharge Diagnoses: Resolved bowel obstruction Principal Problem:   Intractable abdominal pain Active Problems:   Essential hypertension   Type II diabetes mellitus with neurological manifestations (HCC)   SBO (small bowel obstruction) ([place])   Diarrhea   Discharged Condition: good  Hospital Course: Patient is a 72 year old white female status post laparoscopic appendectomy for perforation in January 2024 who Tama Gander presented to the hospital with nausea, vomiting, abdominal distention.  Repeat CT scan of the abdomen pelvis revealed resolution of the phlegmon in the right lower quadrant but there was a concern for a bowel obstruction.  She subsequently underwent a small bowel obstruction protocol study which showed there was no obstruction present.  The patient is feeling much better.  Her diet was advanced out difficulty.  She is being discharged home on 05/15/2022 in good and improving condition.  She was offered a skilled nursing facility bed, but she refused.   Discharge Exam: Blood pressure 134/72, pulse 78, temperature 97.8 F (36.6 C), temperature source Oral, resp. rate 18, height 5' 2"$  (1.575 m), weight 120.2 kg, SpO2 100 %. General appearance: alert, cooperative, and no distress Resp: clear to auscultation bilaterally Cardio: regular rate and rhythm, S1, S2 normal, no murmur, click, rub or gallop GI: soft, non-tender; bowel sounds normal; no masses,  no organomegaly  Disposition: Discharge disposition: 01-Home or Self Care       Discharge Instructions     Diet - low sodium heart healthy   Complete by: As directed    Increase activity slowly   Complete by: As directed       Allergies as of 05/15/2022       Reactions   Aspirin Other (See Comments)   G.I.  Upset    Nalbuphine Itching, Swelling, Rash   Nubaine        Medication List     STOP taking these medications    amoxicillin-clavulanate 500-125 MG tablet Commonly known as: Augmentin       TAKE these medications    amitriptyline 10 MG tablet Commonly known as: ELAVIL Take 10 mg by mouth at bedtime.   amLODipine 2.5 MG tablet Commonly known as: NORVASC Take 2.5 mg by mouth daily.   Blood Glucose System Pak Kit Please dispense as Accu Chek Guide. USE TO TEST BLOOD SUGAR 3 TIMES DAILY. Dx: E11.65.   BLOOD GLUCOSE TEST STRIPS Strp Please dispense as Accu Chek Guide. USE TO TEST BLOOD SUGAR 3 TIMES DAILY. Dx: E11.65   calcium carbonate 500 MG chewable tablet Commonly known as: Tums Chew 1 tablet (200 mg of elemental calcium total) by mouth 2 (two) times daily.   cholecalciferol 25 MCG (1000 UNIT) tablet Commonly known as: VITAMIN D3 TAKE (1) TABLET BY MOUTH ONCE DAILY. What changed: See the new instructions.   dexlansoprazole 60 MG capsule Commonly known as: DEXILANT TAKE 1 CAPSULE DAILY.   docusate sodium 100 MG capsule Commonly known as: Colace Take 1 capsule (100 mg total) by mouth 2 (two) times daily.   furosemide 40 MG tablet Commonly known as: LASIX TAKE (1) TABLET BY MOUTH ONCE DAILY AS NEEDED. What changed: See the new instructions.   HYDROcodone-acetaminophen 5-325 MG tablet Commonly known as: Norco Take 1 tablet by mouth every 6 (six) hours as needed for moderate pain.   Lancet Devices Misc Please dispense based  on patient and insurance preference. Monitor FSBS 3x daily. Dx: E11.65   Lancets Misc Please dispense as Accu Chek Guide. USE TO TEST BLOOD SUGAR 3 TIMES DAILY. Dx: E11.65.   Linzess 290 MCG Caps capsule Generic drug: linaclotide TAKE 1 CAPSULE ONCE DAILY BEFORE BREAKFAST What changed: See the new instructions.   metFORMIN 500 MG tablet Commonly known as: GLUCOPHAGE Take 500 mg by mouth daily.   metoprolol succinate 50 MG 24 hr  tablet Commonly known as: TOPROL-XL Take 50 mg by mouth daily.   ondansetron 4 MG tablet Commonly known as: Zofran Take 1 tablet (4 mg total) by mouth every 8 (eight) hours as needed for nausea or vomiting.        Follow-up Information     Aviva Signs, MD Follow up.   Specialty: General Surgery Why: As needed Contact information: [id number] [place] [id number] [phone]                 Signed: Aviva Signs 05/15/2022, 12:19 PM

## 2022-05-15 NOTE — Progress Notes (Signed)
Patient rested through the night, glucose level remained within acceptable range. Patient resting comfortably in bed. No distress noted.

## 2022-05-16 ENCOUNTER — Encounter (HOSPITAL_COMMUNITY): Payer: Self-pay | Admitting: Emergency Medicine

## 2022-05-16 ENCOUNTER — Inpatient Hospital Stay (HOSPITAL_COMMUNITY)
Admission: EM | Admit: 2022-05-16 | Discharge: 2022-05-20 | DRG: 392 | Disposition: A | Discharge status: Home Health | Payer: Medicare HMO | Attending: Internal Medicine | Admitting: Internal Medicine

## 2022-05-16 ENCOUNTER — Other Ambulatory Visit: Payer: Self-pay

## 2022-05-16 ENCOUNTER — Emergency Department (HOSPITAL_COMMUNITY): Payer: Medicare HMO

## 2022-05-16 DIAGNOSIS — K219 Gastro-esophageal reflux disease without esophagitis: Secondary | ICD-10-CM | POA: Diagnosis not present

## 2022-05-16 DIAGNOSIS — M810 Age-related osteoporosis without current pathological fracture: Secondary | ICD-10-CM | POA: Diagnosis present

## 2022-05-16 DIAGNOSIS — Z8262 Family history of osteoporosis: Secondary | ICD-10-CM

## 2022-05-16 DIAGNOSIS — Z79899 Other long term (current) drug therapy: Secondary | ICD-10-CM

## 2022-05-16 DIAGNOSIS — R109 Unspecified abdominal pain: Secondary | ICD-10-CM | POA: Diagnosis not present

## 2022-05-16 DIAGNOSIS — R112 Nausea with vomiting, unspecified: Secondary | ICD-10-CM | POA: Diagnosis not present

## 2022-05-16 DIAGNOSIS — K760 Fatty (change of) liver, not elsewhere classified: Secondary | ICD-10-CM | POA: Diagnosis present

## 2022-05-16 DIAGNOSIS — Z86711 Personal history of pulmonary embolism: Secondary | ICD-10-CM

## 2022-05-16 DIAGNOSIS — Z961 Presence of intraocular lens: Secondary | ICD-10-CM | POA: Diagnosis present

## 2022-05-16 DIAGNOSIS — Z6841 Body Mass Index (BMI) 40.0 and over, adult: Secondary | ICD-10-CM | POA: Diagnosis not present

## 2022-05-16 DIAGNOSIS — Z90710 Acquired absence of both cervix and uterus: Secondary | ICD-10-CM

## 2022-05-16 DIAGNOSIS — Z886 Allergy status to analgesic agent status: Secondary | ICD-10-CM

## 2022-05-16 DIAGNOSIS — R1031 Right lower quadrant pain: Secondary | ICD-10-CM | POA: Diagnosis not present

## 2022-05-16 DIAGNOSIS — I700 Atherosclerosis of aorta: Secondary | ICD-10-CM | POA: Diagnosis not present

## 2022-05-16 DIAGNOSIS — F419 Anxiety disorder, unspecified: Secondary | ICD-10-CM | POA: Diagnosis present

## 2022-05-16 DIAGNOSIS — K5730 Diverticulosis of large intestine without perforation or abscess without bleeding: Secondary | ICD-10-CM | POA: Diagnosis present

## 2022-05-16 DIAGNOSIS — R1030 Lower abdominal pain, unspecified: Secondary | ICD-10-CM

## 2022-05-16 DIAGNOSIS — R1111 Vomiting without nausea: Secondary | ICD-10-CM | POA: Diagnosis not present

## 2022-05-16 DIAGNOSIS — E876 Hypokalemia: Secondary | ICD-10-CM | POA: Diagnosis present

## 2022-05-16 DIAGNOSIS — R6889 Other general symptoms and signs: Secondary | ICD-10-CM | POA: Diagnosis not present

## 2022-05-16 DIAGNOSIS — Z9049 Acquired absence of other specified parts of digestive tract: Secondary | ICD-10-CM

## 2022-05-16 DIAGNOSIS — Z818 Family history of other mental and behavioral disorders: Secondary | ICD-10-CM

## 2022-05-16 DIAGNOSIS — Z888 Allergy status to other drugs, medicaments and biological substances status: Secondary | ICD-10-CM | POA: Diagnosis not present

## 2022-05-16 DIAGNOSIS — I10 Essential (primary) hypertension: Secondary | ICD-10-CM | POA: Diagnosis present

## 2022-05-16 DIAGNOSIS — R1084 Generalized abdominal pain: Secondary | ICD-10-CM | POA: Diagnosis not present

## 2022-05-16 DIAGNOSIS — E119 Type 2 diabetes mellitus without complications: Secondary | ICD-10-CM | POA: Diagnosis present

## 2022-05-16 DIAGNOSIS — Z743 Need for continuous supervision: Secondary | ICD-10-CM | POA: Diagnosis not present

## 2022-05-16 DIAGNOSIS — Z8249 Family history of ischemic heart disease and other diseases of the circulatory system: Secondary | ICD-10-CM

## 2022-05-16 DIAGNOSIS — K529 Noninfective gastroenteritis and colitis, unspecified: Principal | ICD-10-CM

## 2022-05-16 DIAGNOSIS — Z7984 Long term (current) use of oral hypoglycemic drugs: Secondary | ICD-10-CM | POA: Diagnosis not present

## 2022-05-16 DIAGNOSIS — E039 Hypothyroidism, unspecified: Secondary | ICD-10-CM | POA: Diagnosis not present

## 2022-05-16 DIAGNOSIS — Z86718 Personal history of other venous thrombosis and embolism: Secondary | ICD-10-CM

## 2022-05-16 DIAGNOSIS — H409 Unspecified glaucoma: Secondary | ICD-10-CM | POA: Diagnosis present

## 2022-05-16 DIAGNOSIS — M797 Fibromyalgia: Secondary | ICD-10-CM | POA: Diagnosis present

## 2022-05-16 LAB — CBC WITH DIFFERENTIAL/PLATELET
Abs Immature Granulocytes: 0.04 10*3/uL (ref 0.00–0.07)
Basophils Absolute: 0 10*3/uL (ref 0.0–0.1)
Basophils Relative: 1 %
Eosinophils Absolute: 0.2 10*3/uL (ref 0.0–0.5)
Eosinophils Relative: 3 %
HCT: 34.3 % — ABNORMAL LOW (ref 36.0–46.0)
Hemoglobin: 9.8 g/dL — ABNORMAL LOW (ref 12.0–15.0)
Immature Granulocytes: 1 %
Lymphocytes Relative: 17 %
Lymphs Abs: 0.9 10*3/uL (ref 0.7–4.0)
MCH: 24.1 pg — ABNORMAL LOW (ref 26.0–34.0)
MCHC: 28.6 g/dL — ABNORMAL LOW (ref 30.0–36.0)
MCV: 84.3 fL (ref 80.0–100.0)
Monocytes Absolute: 0.3 10*3/uL (ref 0.1–1.0)
Monocytes Relative: 7 %
Neutro Abs: 3.7 10*3/uL (ref 1.7–7.7)
Neutrophils Relative %: 71 %
Platelets: 388 10*3/uL (ref 150–400)
RBC: 4.07 MIL/uL (ref 3.87–5.11)
RDW: 17.2 % — ABNORMAL HIGH (ref 11.5–15.5)
WBC: 5.1 10*3/uL (ref 4.0–10.5)
nRBC: 0 % (ref 0.0–0.2)

## 2022-05-16 LAB — BASIC METABOLIC PANEL
Anion gap: 11 (ref 5–15)
BUN: 5 mg/dL — ABNORMAL LOW (ref 8–23)
CO2: 25 mmol/L (ref 22–32)
Calcium: 9.1 mg/dL (ref 8.9–10.3)
Chloride: 102 mmol/L (ref 98–111)
Creatinine, Ser: 0.84 mg/dL (ref 0.44–1.00)
GFR, Estimated: >60 mL/min (ref >60)
Glucose, Bld: 139 mg/dL — ABNORMAL HIGH (ref 70–99)
Potassium: 3.2 mmol/L — ABNORMAL LOW (ref 3.5–5.1)
Sodium: 138 mmol/L (ref 135–145)

## 2022-05-16 MED ORDER — HYDROMORPHONE HCL 1 MG/ML IJ SOLN
1.0000 mg | Freq: Once | INTRAMUSCULAR | Status: AC
  Administered 2022-05-16: 1 mg via INTRAVENOUS
  Filled 2022-05-16: qty 1

## 2022-05-16 MED ORDER — ONDANSETRON HCL 4 MG/2ML IJ SOLN
4.0000 mg | Freq: Once | INTRAMUSCULAR | Status: AC
  Administered 2022-05-16: 4 mg via INTRAVENOUS
  Filled 2022-05-16: qty 2

## 2022-05-16 MED ORDER — SODIUM CHLORIDE 0.9 % IV SOLN: INTRAVENOUS | Status: DC

## 2022-05-16 MED ORDER — IOHEXOL 300 MG/ML  SOLN
100.0000 mL | Freq: Once | INTRAMUSCULAR | Status: AC | PRN
  Administered 2022-05-16: 100 mL via INTRAVENOUS

## 2022-05-16 NOTE — ED Triage Notes (Signed)
Pt here for emesis x 3 months. States she has been vomiting since having her appendix removed. Seen here recently for same.

## 2022-05-16 NOTE — ED Provider Notes (Signed)
[place] Provider Note   CSN: [id number] Arrival date & time: 05/16/22  2119     History  Chief Complaint  Patient presents with   Emesis    Yesenia [name] is a 72 y.o. female.   Emesis This patient is a very pleasant 72 year old female who unfortunately was admitted to the hospital within the last couple months with a ruptured appendix, she had surgery, she had up appendectomy, she then was readmitted to the hospital a short time later with a phlegmon and required antibiotics.  She was discharged but came back with a small bowel obstruction last week, discharged yesterday and unfortunately today after trying to eat a hot pocket she developed increasing abdominal pain and multiple episodes of vomiting.  Denies fever, denies back pain, denies chest pain coughing or shortness of breath.     Home Medications Prior to Admission medications   Medication Sig Start Date End Date Taking? Authorizing Provider  amitriptyline (ELAVIL) 10 MG tablet Take 10 mg by mouth at bedtime.    [provider]  amLODipine (NORVASC) 2.5 MG tablet Take 2.5 mg by mouth daily.    [provider]  Blood Glucose Monitoring Suppl (BLOOD GLUCOSE SYSTEM PAK) KIT Please dispense as Accu Chek Guide. USE TO TEST BLOOD SUGAR 3 TIMES DAILY. Dx: E11.65. 01/23/20   [name], MD  calcium carbonate (TUMS) 500 MG chewable tablet Chew 1 tablet (200 mg of elemental calcium total) by mouth 2 (two) times daily. 08/09/20   Roxan Hockey, MD  cholecalciferol (VITAMIN D) 25 MCG (1000 UNIT) tablet TAKE (1) TABLET BY MOUTH ONCE DAILY. Patient taking differently: Take 1,000 Units by mouth daily. 06/24/20   [name], MD  dexlansoprazole (DEXILANT) 60 MG capsule TAKE 1 CAPSULE DAILY. Patient taking differently: Take 60 mg by mouth daily. 03/18/22   Rourk, Cristopher Estimable, MD  docusate sodium (COLACE) 100 MG capsule Take 1 capsule (100 mg total) by mouth 2  (two) times daily. 04/26/22 04/26/23  Pappayliou, Barnetta Chapel A, DO  furosemide (LASIX) 40 MG tablet TAKE (1) TABLET BY MOUTH ONCE DAILY AS NEEDED. Patient taking differently: Take 40 mg by mouth daily as needed for edema. 06/24/20   [name], MD  Glucose Blood (BLOOD GLUCOSE TEST STRIPS) STRP Please dispense as Accu Chek Guide. USE TO TEST BLOOD SUGAR 3 TIMES DAILY. Dx: E11.65 01/23/20   [name], MD  HYDROcodone-acetaminophen (NORCO) 5-325 MG tablet Take 1 tablet by mouth every 6 (six) hours as needed for moderate pain. 05/09/22   Aviva Signs, MD  Lancet Devices MISC Please dispense based on patient and insurance preference. Monitor FSBS 3x daily. Dx: E11.65 09/13/17   [name], MD  Lancets MISC Please dispense as Accu Chek Guide. USE TO TEST BLOOD SUGAR 3 TIMES DAILY. Dx: E11.65. 01/23/20   [name], MD  linaclotide [facility]) 290 MCG CAPS capsule TAKE 1 CAPSULE ONCE DAILY BEFORE BREAKFAST Patient taking differently: Take 290 mcg by mouth daily before breakfast. 02/18/22   [name], NP  metFORMIN (GLUCOPHAGE) 500 MG tablet Take 500 mg by mouth daily.    [provider]  metoprolol succinate (TOPROL-XL) 50 MG 24 hr tablet Take 50 mg by mouth daily.    [provider]  ondansetron (ZOFRAN) 4 MG tablet Take 1 tablet (4 mg total) by mouth every 8 (eight) hours as needed for nausea or vomiting. 04/30/22   Aviva Signs, MD  Allergies    Aspirin and Nalbuphine    Review of Systems   Review of Systems  Gastrointestinal:  Positive for vomiting.  All other systems reviewed and are negative.   Physical Exam Updated Vital Signs BP (!) 174/95   Pulse 94   Temp 98.5 F (36.9 C) (Oral)   Resp 18   Ht 1.575 m (5' 2"$ )   Wt 120 kg   SpO2 94%   BMI 48.39 kg/m  Physical Exam Vitals and nursing note reviewed.  Constitutional:      General: She is not in acute distress.    Appearance: She is well-developed.  HENT:     Head: Normocephalic  and atraumatic.     Mouth/Throat:     Pharynx: No oropharyngeal exudate.  Eyes:     General: No scleral icterus.       Right eye: No discharge.        Left eye: No discharge.     Conjunctiva/sclera: Conjunctivae normal.     Pupils: Pupils are equal, round, and reactive to light.  Neck:     Thyroid: No thyromegaly.     Vascular: No JVD.  Cardiovascular:     Rate and Rhythm: Normal rate and regular rhythm.     Heart sounds: Normal heart sounds. No murmur heard.    No friction rub. No gallop.  Pulmonary:     Effort: Pulmonary effort is normal. No respiratory distress.     Breath sounds: Normal breath sounds. No wheezing or rales.  Abdominal:     General: Bowel sounds are normal. There is no distension.     Palpations: Abdomen is soft. There is no mass.     Tenderness: There is abdominal tenderness.     Comments: Mid to lower abdominal tenderness, no guarding or peritoneal signs  Musculoskeletal:        General: No tenderness. Normal range of motion.     Cervical back: Normal range of motion and neck supple.  Lymphadenopathy:     Cervical: No cervical adenopathy.  Skin:    General: Skin is warm and dry.     Findings: No erythema or rash.  Neurological:     Mental Status: She is alert.     Coordination: Coordination normal.  Psychiatric:        Behavior: Behavior normal.     ED Results / Procedures / Treatments   Labs (all labs ordered are listed, but only abnormal results are displayed) Labs Reviewed  BASIC METABOLIC PANEL  CBC WITH DIFFERENTIAL/PLATELET    EKG None  Radiology No results found.  Procedures Procedures    Medications Ordered in ED Medications  0.9 %  sodium chloride infusion (has no administration in time range)  ondansetron (ZOFRAN) injection 4 mg (has no administration in time range)  HYDROmorphone (DILAUDID) injection 1 mg (has no administration in time range)    ED Course/ Medical Decision Making/ A&P                              Medical Decision Making Amount and/or Complexity of Data Reviewed Labs: ordered. Radiology: ordered.  Risk Prescription drug management.   This patient presents to the ED for concern of abdominal pain nausea and vomiting, she is having bowel movements and passing gas today differential diagnosis includes postprandial pain, cholecystitis, bowel obstruction, ileus    Additional history obtained:  Additional history obtained from [place] record prior External records from  outside source obtained and reviewed including prior admission, surgery, bowel obstruction   Lab Tests:  I Ordered, and personally interpreted labs.  The pertinent results include: CBC and metabolic panel rather unremarkable   Imaging Studies ordered:  I ordered imaging studies including CT of the abdomen and pelvis I independently visualized and interpreted imaging which is pending at the time of change of shift I agree with the radiologist interpretation   Medicines ordered and prescription drug management:  At the time of change of shift, care signed out to Dr. Valentino Saxon to follow-up results and disposition accordingly   Social Determinants of Health:  Elderly - recent surgery           Final Clinical Impression(s) / ED Diagnoses Final diagnoses:  None    Rx / DC Orders ED Discharge Orders     None         Noemi Chapel, MD 05/16/22 2322

## 2022-05-17 ENCOUNTER — Encounter (HOSPITAL_COMMUNITY): Payer: Self-pay | Admitting: Internal Medicine

## 2022-05-17 ENCOUNTER — Other Ambulatory Visit: Payer: Self-pay

## 2022-05-17 DIAGNOSIS — E876 Hypokalemia: Secondary | ICD-10-CM

## 2022-05-17 DIAGNOSIS — E039 Hypothyroidism, unspecified: Secondary | ICD-10-CM | POA: Diagnosis not present

## 2022-05-17 DIAGNOSIS — I10 Essential (primary) hypertension: Secondary | ICD-10-CM | POA: Diagnosis not present

## 2022-05-17 DIAGNOSIS — R112 Nausea with vomiting, unspecified: Secondary | ICD-10-CM

## 2022-05-17 DIAGNOSIS — E119 Type 2 diabetes mellitus without complications: Secondary | ICD-10-CM

## 2022-05-17 DIAGNOSIS — R1084 Generalized abdominal pain: Secondary | ICD-10-CM

## 2022-05-17 DIAGNOSIS — K219 Gastro-esophageal reflux disease without esophagitis: Secondary | ICD-10-CM | POA: Diagnosis not present

## 2022-05-17 LAB — RAPID URINE DRUG SCREEN, HOSP PERFORMED
Amphetamines: NOT DETECTED
Barbiturates: NOT DETECTED
Benzodiazepines: NOT DETECTED
Cocaine: NOT DETECTED
Opiates: POSITIVE — AB
Tetrahydrocannabinol: NOT DETECTED

## 2022-05-17 LAB — CBC WITH DIFFERENTIAL/PLATELET
Abs Immature Granulocytes: 0.03 10*3/uL (ref 0.00–0.07)
Basophils Absolute: 0 10*3/uL (ref 0.0–0.1)
Basophils Relative: 1 %
Eosinophils Absolute: 0.1 10*3/uL (ref 0.0–0.5)
Eosinophils Relative: 2 %
HCT: 33.1 % — ABNORMAL LOW (ref 36.0–46.0)
Hemoglobin: 9.3 g/dL — ABNORMAL LOW (ref 12.0–15.0)
Immature Granulocytes: 1 %
Lymphocytes Relative: 27 %
Lymphs Abs: 1.4 10*3/uL (ref 0.7–4.0)
MCH: 23.7 pg — ABNORMAL LOW (ref 26.0–34.0)
MCHC: 28.1 g/dL — ABNORMAL LOW (ref 30.0–36.0)
MCV: 84.2 fL (ref 80.0–100.0)
Monocytes Absolute: 0.4 10*3/uL (ref 0.1–1.0)
Monocytes Relative: 9 %
Neutro Abs: 3 10*3/uL (ref 1.7–7.7)
Neutrophils Relative %: 60 %
Platelets: 385 10*3/uL (ref 150–400)
RBC: 3.93 MIL/uL (ref 3.87–5.11)
RDW: 17.5 % — ABNORMAL HIGH (ref 11.5–15.5)
WBC: 5 10*3/uL (ref 4.0–10.5)
nRBC: 0 % (ref 0.0–0.2)

## 2022-05-17 LAB — COMPREHENSIVE METABOLIC PANEL
ALT: 9 U/L (ref 0–44)
AST: 18 U/L (ref 15–41)
Albumin: 2.9 g/dL — ABNORMAL LOW (ref 3.5–5.0)
Alkaline Phosphatase: 54 U/L (ref 38–126)
Anion gap: 8 (ref 5–15)
BUN: <5 mg/dL — ABNORMAL LOW (ref 8–23)
CO2: 23 mmol/L (ref 22–32)
Calcium: 8.8 mg/dL — ABNORMAL LOW (ref 8.9–10.3)
Chloride: 106 mmol/L (ref 98–111)
Creatinine, Ser: 0.69 mg/dL (ref 0.44–1.00)
GFR, Estimated: >60 mL/min (ref >60)
Glucose, Bld: 112 mg/dL — ABNORMAL HIGH (ref 70–99)
Potassium: 3.7 mmol/L (ref 3.5–5.1)
Sodium: 137 mmol/L (ref 135–145)
Total Bilirubin: 0.2 mg/dL — ABNORMAL LOW (ref 0.3–1.2)
Total Protein: 6.7 g/dL (ref 6.5–8.1)

## 2022-05-17 LAB — URINALYSIS, COMPLETE (UACMP) WITH MICROSCOPIC
Bilirubin Urine: NEGATIVE
Glucose, UA: NEGATIVE mg/dL
Hgb urine dipstick: NEGATIVE
Ketones, ur: NEGATIVE mg/dL
Leukocytes,Ua: NEGATIVE
Nitrite: NEGATIVE
Protein, ur: NEGATIVE mg/dL
Specific Gravity, Urine: 1.03 (ref 1.005–1.030)
pH: 6 (ref 5.0–8.0)

## 2022-05-17 LAB — GLUCOSE, CAPILLARY
Glucose-Capillary: 112 mg/dL — ABNORMAL HIGH (ref 70–99)
Glucose-Capillary: 91 mg/dL (ref 70–99)
Glucose-Capillary: 111 mg/dL — ABNORMAL HIGH (ref 70–99)

## 2022-05-17 LAB — LACTIC ACID, PLASMA: Lactic Acid, Venous: 0.8 mmol/L (ref 0.5–1.9)

## 2022-05-17 LAB — TSH: TSH: 21.842 u[IU]/mL — ABNORMAL HIGH (ref 0.350–4.500)

## 2022-05-17 LAB — MAGNESIUM
Magnesium: 1.8 mg/dL (ref 1.7–2.4)
Magnesium: 1.7 mg/dL (ref 1.7–2.4)

## 2022-05-17 LAB — CBG MONITORING, ED: Glucose-Capillary: 104 mg/dL — ABNORMAL HIGH (ref 70–99)

## 2022-05-17 MED ORDER — NALOXONE HCL 0.4 MG/ML IJ SOLN: 0.4000 mg | INTRAMUSCULAR | Status: DC | PRN

## 2022-05-17 MED ORDER — ACETAMINOPHEN 650 MG RE SUPP: 650.0000 mg | Freq: Four times a day (QID) | RECTAL | Status: DC | PRN

## 2022-05-17 MED ORDER — PANTOPRAZOLE SODIUM 40 MG IV SOLR
40.0000 mg | INTRAVENOUS | Status: DC
  Administered 2022-05-17 – 2022-05-20 (×4): 40 mg via INTRAVENOUS
  Filled 2022-05-17 (×4): qty 10

## 2022-05-17 MED ORDER — FENTANYL CITRATE PF 50 MCG/ML IJ SOSY
25.0000 ug | PREFILLED_SYRINGE | INTRAMUSCULAR | Status: DC | PRN
  Administered 2022-05-17: 25 ug via INTRAVENOUS
  Filled 2022-05-17: qty 1

## 2022-05-17 MED ORDER — HYDROMORPHONE HCL 1 MG/ML IJ SOLN
0.5000 mg | INTRAMUSCULAR | Status: DC | PRN
  Administered 2022-05-17: 0.5 mg via INTRAVENOUS
  Filled 2022-05-17: qty 0.5

## 2022-05-17 MED ORDER — OXYCODONE HCL 5 MG PO TABS
5.0000 mg | ORAL_TABLET | Freq: Four times a day (QID) | ORAL | Status: DC | PRN
  Administered 2022-05-17 – 2022-05-20 (×11): 5 mg via ORAL
  Filled 2022-05-17 (×11): qty 1

## 2022-05-17 MED ORDER — INSULIN ASPART 100 UNIT/ML IJ SOLN: 0.0000 [IU] | Freq: Four times a day (QID) | INTRAMUSCULAR | Status: DC

## 2022-05-17 MED ORDER — ONDANSETRON HCL 4 MG/2ML IJ SOLN
4.0000 mg | Freq: Four times a day (QID) | INTRAMUSCULAR | Status: DC | PRN
  Administered 2022-05-17 – 2022-05-20 (×12): 4 mg via INTRAVENOUS
  Filled 2022-05-17 (×12): qty 2

## 2022-05-17 MED ORDER — POTASSIUM CHLORIDE 10 MEQ/100ML IV SOLN
10.0000 meq | INTRAVENOUS | Status: AC
  Administered 2022-05-17 (×4): 10 meq via INTRAVENOUS
  Filled 2022-05-17 (×4): qty 100

## 2022-05-17 MED ORDER — ACETAMINOPHEN 325 MG PO TABS: 650.0000 mg | ORAL_TABLET | Freq: Four times a day (QID) | ORAL | Status: DC | PRN

## 2022-05-17 MED ORDER — ACETAMINOPHEN 500 MG PO TABS
1000.0000 mg | ORAL_TABLET | Freq: Four times a day (QID) | ORAL | Status: DC
  Administered 2022-05-17 – 2022-05-20 (×8): 1000 mg via ORAL
  Filled 2022-05-17 (×11): qty 2

## 2022-05-17 NOTE — Progress Notes (Signed)
Yesenia [name] is a 72 y.o. female with medical history significant for type 2 diabetes mellitus, send hypertension, GERD, acquired hypothyroidism, who is admitted to [facility] on 05/16/2022 with intractable nausea/vomiting after presenting from home to [facility] ED complaining of nausea/vomiting.  She was recently discharged on 2/9 with similar symptoms and has CT findings that are nonspecific.  Patient seen and evaluated at bedside and has been admitted after midnight.  Continue to follow labs and maintain n.p.o. status and keep on IV fluid for now.  Discussed case with general surgery who will evaluate today with further recommendations pending.  Hopefully can advance diet soon.  Total care time: 30 minutes.
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
## 2022-05-17 NOTE — Care Management Obs Status (Signed)
[place] NOTIFICATION   Patient Details  Name: Yesenia [name] MRN: [id number] Date of Birth: 1951-03-07   Medicare Observation Status Notification Given:  Yes    Salome Arnt, [place] 05/17/2022, 9:16 AM

## 2022-05-17 NOTE — ED Notes (Signed)
Attempted to call report on patient. Nurse states unable to except patient at this time.

## 2022-05-17 NOTE — ED Provider Notes (Signed)
  Physical Exam  BP (!) 179/97   Pulse 87   Temp 98.5 F (36.9 C) (Oral)   Resp 18   Ht '5\' 2"'$  (1.575 m)   Wt 120 kg   SpO2 93%   BMI 48.39 kg/m   Physical Exam Vitals and nursing note reviewed.  Constitutional:      General: She is not in acute distress.    Appearance: She is well-developed. She is not diaphoretic.  HENT:     Head: Normocephalic and atraumatic.  Cardiovascular:     Rate and Rhythm: Normal rate and regular rhythm.     Heart sounds: No murmur heard.    No friction rub. No gallop.  Pulmonary:     Effort: Pulmonary effort is normal. No respiratory distress.     Breath sounds: Normal breath sounds. No wheezing.  Abdominal:     General: Bowel sounds are normal. There is no distension.     Palpations: Abdomen is soft.     Tenderness: There is abdominal tenderness. There is no guarding or rebound.     Comments: There is tenderness to palpation across the right lower quadrant and suprapubic region.  Musculoskeletal:        General: Normal range of motion.     Cervical back: Normal range of motion and neck supple.  Skin:    General: Skin is warm and dry.  Neurological:     General: No focal deficit present.     Mental Status: She is alert and oriented to person, place, and time.     Procedures  Procedures  ED Course / MDM    Medical Decision Making Amount and/or Complexity of Data Reviewed Labs: ordered. Radiology: ordered.  Risk Prescription drug management.   Care assumed from Dr. Sabra Heck at shift change.  Patient awaiting results of a CT scan of the abdomen and pelvis.  She recently had a perforated appendectomy which was removed, then experienced a postoperative abscess, then had a third admission for a small bowel obstruction.  She returns with vomiting and abdominal pain that recurred after trying to eat this evening.    CT scan has returned and shows small bowel loops in the right lower quadrant that are mildly dilated with wall thickening and  surrounding inflammatory stranding and questionable tiny amount of pneumatosis.  These findings are concerning for possible small bowel ischemia or nonspecific enteritis.  I obtained a lactate which was normal, so doubt ischemic bowel.  I do feel as though patient should be admitted for further observation and pain control.  I have spoken with Dr. Velia Meyer from the hospitalist service who agrees to admit.       Veryl Speak, MD 05/17/22 [phone]
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
## 2022-05-17 NOTE — TOC Progression Note (Signed)
Transition of Care [facility]) - Progression Note    Patient Details  Name: Yesenia [name] MRN: [id number] Date of Birth: 1951/03/06  Transition of Care [facility]) CM/SW Contact  Salome Arnt, [place] Phone Number: 05/17/2022, 10:44 AM  Clinical Narrative:  Transition of Care [facility]) Screening Note   Patient Details  Name: Yesenia [name] Date of Birth: 07-22-1950   Transition of Care [facility]) CM/SW Contact:    Salome Arnt, [place] Phone Number: 05/17/2022, 10:44 AM    Transition of Care Department [facility]) has reviewed patient and no TOC needs have been identified at this time. We will continue to monitor patient advancement through interdisciplinary progression rounds. If new patient transition needs arise, please place a TOC consult.          Barriers to Discharge: Continued Medical Work up  Expected Discharge Plan and Services                                               Social Determinants of Health (SDOH) Interventions SDOH Screenings   Food Insecurity: No Food Insecurity (05/17/2022)  Housing: Low Risk  (05/17/2022)  Transportation Needs: No Transportation Needs (05/17/2022)  Utilities: Not At Risk (05/17/2022)  Alcohol Screen: Low Risk  (05/22/2020)  Depression (PHQ2-9): Medium Risk (05/22/2020)  Financial Resource Strain: Low Risk  (02/21/2020)  Tobacco Use: Low Risk  (05/17/2022)    Readmission Risk Interventions    05/13/2022   12:12 PM 05/06/2022    9:01 AM 04/23/2022    2:39 PM  Readmission Risk Prevention Plan  Post Dischage Appt   Not Complete  Medication Screening Complete  Complete  Transportation Screening Complete Complete Complete  PCP or Specialist Appt within 5-7 Days  Complete   Home Care Screening  Complete   Medication Review (RN CM)  Complete

## 2022-05-17 NOTE — Progress Notes (Signed)
Got nauseated when trying to eat lunch.  Ate little to nothing. Did not vomit

## 2022-05-18 DIAGNOSIS — E876 Hypokalemia: Secondary | ICD-10-CM | POA: Diagnosis present

## 2022-05-18 DIAGNOSIS — H409 Unspecified glaucoma: Secondary | ICD-10-CM | POA: Diagnosis present

## 2022-05-18 DIAGNOSIS — Z79899 Other long term (current) drug therapy: Secondary | ICD-10-CM | POA: Diagnosis not present

## 2022-05-18 DIAGNOSIS — M797 Fibromyalgia: Secondary | ICD-10-CM | POA: Diagnosis present

## 2022-05-18 DIAGNOSIS — Z86718 Personal history of other venous thrombosis and embolism: Secondary | ICD-10-CM | POA: Diagnosis not present

## 2022-05-18 DIAGNOSIS — R112 Nausea with vomiting, unspecified: Secondary | ICD-10-CM | POA: Diagnosis present

## 2022-05-18 DIAGNOSIS — K219 Gastro-esophageal reflux disease without esophagitis: Secondary | ICD-10-CM | POA: Diagnosis present

## 2022-05-18 DIAGNOSIS — Z9049 Acquired absence of other specified parts of digestive tract: Secondary | ICD-10-CM | POA: Diagnosis not present

## 2022-05-18 DIAGNOSIS — M810 Age-related osteoporosis without current pathological fracture: Secondary | ICD-10-CM | POA: Diagnosis present

## 2022-05-18 DIAGNOSIS — Z6841 Body Mass Index (BMI) 40.0 and over, adult: Secondary | ICD-10-CM | POA: Diagnosis not present

## 2022-05-18 DIAGNOSIS — F419 Anxiety disorder, unspecified: Secondary | ICD-10-CM | POA: Diagnosis present

## 2022-05-18 DIAGNOSIS — K5730 Diverticulosis of large intestine without perforation or abscess without bleeding: Secondary | ICD-10-CM | POA: Diagnosis present

## 2022-05-18 DIAGNOSIS — Z961 Presence of intraocular lens: Secondary | ICD-10-CM | POA: Diagnosis present

## 2022-05-18 DIAGNOSIS — Z8249 Family history of ischemic heart disease and other diseases of the circulatory system: Secondary | ICD-10-CM | POA: Diagnosis not present

## 2022-05-18 DIAGNOSIS — Z888 Allergy status to other drugs, medicaments and biological substances status: Secondary | ICD-10-CM | POA: Diagnosis not present

## 2022-05-18 DIAGNOSIS — Z886 Allergy status to analgesic agent status: Secondary | ICD-10-CM | POA: Diagnosis not present

## 2022-05-18 DIAGNOSIS — Z86711 Personal history of pulmonary embolism: Secondary | ICD-10-CM | POA: Diagnosis not present

## 2022-05-18 DIAGNOSIS — K529 Noninfective gastroenteritis and colitis, unspecified: Secondary | ICD-10-CM | POA: Diagnosis present

## 2022-05-18 DIAGNOSIS — E119 Type 2 diabetes mellitus without complications: Secondary | ICD-10-CM | POA: Diagnosis present

## 2022-05-18 DIAGNOSIS — I10 Essential (primary) hypertension: Secondary | ICD-10-CM | POA: Diagnosis present

## 2022-05-18 DIAGNOSIS — E039 Hypothyroidism, unspecified: Secondary | ICD-10-CM | POA: Diagnosis present

## 2022-05-18 DIAGNOSIS — Z90710 Acquired absence of both cervix and uterus: Secondary | ICD-10-CM | POA: Diagnosis not present

## 2022-05-18 DIAGNOSIS — Z7984 Long term (current) use of oral hypoglycemic drugs: Secondary | ICD-10-CM | POA: Diagnosis not present

## 2022-05-18 DIAGNOSIS — K760 Fatty (change of) liver, not elsewhere classified: Secondary | ICD-10-CM | POA: Diagnosis present

## 2022-05-18 LAB — GLUCOSE, CAPILLARY
Glucose-Capillary: 101 mg/dL — ABNORMAL HIGH (ref 70–99)
Glucose-Capillary: 108 mg/dL — ABNORMAL HIGH (ref 70–99)
Glucose-Capillary: 111 mg/dL — ABNORMAL HIGH (ref 70–99)
Glucose-Capillary: 113 mg/dL — ABNORMAL HIGH (ref 70–99)
Glucose-Capillary: 113 mg/dL — ABNORMAL HIGH (ref 70–99)
Glucose-Capillary: 119 mg/dL — ABNORMAL HIGH (ref 70–99)

## 2022-05-18 LAB — COMPREHENSIVE METABOLIC PANEL
ALT: 9 U/L (ref 0–44)
AST: 16 U/L (ref 15–41)
Albumin: 2.7 g/dL — ABNORMAL LOW (ref 3.5–5.0)
Alkaline Phosphatase: 51 U/L (ref 38–126)
Anion gap: 9 (ref 5–15)
BUN: <5 mg/dL — ABNORMAL LOW (ref 8–23)
CO2: 24 mmol/L (ref 22–32)
Calcium: 8.4 mg/dL — ABNORMAL LOW (ref 8.9–10.3)
Chloride: 102 mmol/L (ref 98–111)
Creatinine, Ser: 0.76 mg/dL (ref 0.44–1.00)
GFR, Estimated: >60 mL/min (ref >60)
Glucose, Bld: 116 mg/dL — ABNORMAL HIGH (ref 70–99)
Potassium: 3.2 mmol/L — ABNORMAL LOW (ref 3.5–5.1)
Sodium: 135 mmol/L (ref 135–145)
Total Bilirubin: 0.6 mg/dL (ref 0.3–1.2)
Total Protein: 6.2 g/dL — ABNORMAL LOW (ref 6.5–8.1)

## 2022-05-18 LAB — MAGNESIUM: Magnesium: 1.7 mg/dL (ref 1.7–2.4)

## 2022-05-18 LAB — CBC
HCT: 32.7 % — ABNORMAL LOW (ref 36.0–46.0)
Hemoglobin: 9.2 g/dL — ABNORMAL LOW (ref 12.0–15.0)
MCH: 23.9 pg — ABNORMAL LOW (ref 26.0–34.0)
MCHC: 28.1 g/dL — ABNORMAL LOW (ref 30.0–36.0)
MCV: 84.9 fL (ref 80.0–100.0)
Platelets: 357 10*3/uL (ref 150–400)
RBC: 3.85 MIL/uL — ABNORMAL LOW (ref 3.87–5.11)
RDW: 17.3 % — ABNORMAL HIGH (ref 11.5–15.5)
WBC: 5.5 10*3/uL (ref 4.0–10.5)
nRBC: 0 % (ref 0.0–0.2)

## 2022-05-18 LAB — T4, FREE: Free T4: 0.75 ng/dL (ref 0.61–1.12)

## 2022-05-18 MED ORDER — AMLODIPINE BESYLATE 5 MG PO TABS
2.5000 mg | ORAL_TABLET | Freq: Every day | ORAL | Status: DC
  Administered 2022-05-18 – 2022-05-20 (×3): 2.5 mg via ORAL
  Filled 2022-05-18 (×3): qty 1

## 2022-05-18 MED ORDER — POTASSIUM CHLORIDE CRYS ER 20 MEQ PO TBCR
40.0000 meq | EXTENDED_RELEASE_TABLET | Freq: Once | ORAL | Status: AC
  Administered 2022-05-18: 40 meq via ORAL
  Filled 2022-05-18: qty 2

## 2022-05-18 MED ORDER — METOPROLOL SUCCINATE ER 50 MG PO TB24
50.0000 mg | ORAL_TABLET | Freq: Every day | ORAL | Status: DC
  Administered 2022-05-18 – 2022-05-20 (×3): 50 mg via ORAL
  Filled 2022-05-18 (×3): qty 1

## 2022-05-18 MED ORDER — AMITRIPTYLINE HCL 10 MG PO TABS
10.0000 mg | ORAL_TABLET | Freq: Every day | ORAL | Status: DC
  Administered 2022-05-18 – 2022-05-19 (×2): 10 mg via ORAL
  Filled 2022-05-18 (×2): qty 1

## 2022-05-18 MED ORDER — ENOXAPARIN SODIUM 60 MG/0.6ML IJ SOSY
50.0000 mg | PREFILLED_SYRINGE | INTRAMUSCULAR | Status: DC
  Administered 2022-05-18 – 2022-05-19 (×2): 50 mg via SUBCUTANEOUS
  Filled 2022-05-18 (×2): qty 0.6

## 2022-05-18 MED ORDER — LACTATED RINGERS IV SOLN: INTRAVENOUS | Status: DC

## 2022-05-18 MED ORDER — LINACLOTIDE 145 MCG PO CAPS
290.0000 ug | ORAL_CAPSULE | Freq: Every day | ORAL | Status: DC
  Administered 2022-05-19 – 2022-05-20 (×2): 290 ug via ORAL
  Filled 2022-05-18 (×2): qty 2

## 2022-05-18 NOTE — Progress Notes (Signed)
Subjective: Did not eat breakfast this morning as she did not like the smell of the food.  Objective: Vital [name] in last 24 hours: Temp:  [98.2 F (36.8 C)-98.8 F (37.1 C)] 98.2 F (36.8 C) (02/12 0400) Pulse Rate:  [79-87] 79 (02/12 0400) Resp:  [17] 17 (02/12 0400) BP: (135-160)/(81-96) 135/96 (02/12 0400) SpO2:  [92 %-95 %] 92 % (02/12 0400) Weight:  [111.4 kg] 111.4 kg (02/12 0553) Last BM Date : 05/16/22  Intake/Output from previous day: 02/11 0701 - 02/12 0700 In: 485.8 [P.O.:200; IV Piggyback:285.8] Out: 500 [Urine:500] Intake/Output this shift: Total I/O In: -  Out: 1000 [Urine:1000]  General appearance: cooperative and no distress GI: Soft with migratory intermittent abdominal discomfort.  No distention is noted.  Bowel sounds are active.  Lab Results:  Recent Labs    05/17/22 0615 05/18/22 0708  WBC 5.0 5.5  HGB 9.3* 9.2*  HCT 33.1* 32.7*  PLT 385 357   BMET Recent Labs    05/17/22 0615 05/18/22 0708  NA 137 135  K 3.7 3.2*  CL 106 102  CO2 23 24  GLUCOSE 112* 116*  BUN <5* <5*  CREATININE 0.69 0.76  CALCIUM 8.8* 8.4*   PT/INR No results for input(s): "LABPROT", "INR" in the last 72 hours.  Studies/Results: CT ABDOMEN PELVIS W CONTRAST  Result Date: 05/16/2022 CLINICAL DATA:  Acute abdominal pain. EXAM: CT ABDOMEN AND PELVIS WITH CONTRAST TECHNIQUE: Multidetector CT imaging of the abdomen and pelvis was performed using the standard protocol following bolus administration of intravenous contrast. RADIATION DOSE REDUCTION: This exam was performed according to the departmental dose-optimization program which includes automated exposure control, adjustment of the mA and/or kV according to patient size and/or use of iterative reconstruction technique. CONTRAST:  125m OMNIPAQUE IOHEXOL 300 MG/ML  SOLN COMPARISON:  CT abdomen and pelvis 05/12/2022 FINDINGS: Lower chest: No acute abnormality. Hepatobiliary: No focal liver abnormality is seen. Status  post cholecystectomy. No biliary dilatation. Pancreas: Unremarkable. No pancreatic ductal dilatation or surrounding inflammatory changes. Spleen: Normal in size without focal abnormality. Adrenals/Urinary Tract: There are bilateral renal cysts measuring up to 2.2 cm. There is no hydronephrosis or perinephric fluid. The adrenal glands and bladder are within normal limits. Stomach/Bowel: There are small bowel loops in the right lower quadrant which are mildly dilated with diffuse wall thickening, surrounding inflammatory stranding and mesenteric edema. Questionable tiny amount of pneumatosis seen on axial image 69 and 70. There are adjacent small lymph nodes. Proximal small bowel is nondilated. Stomach is nondilated. There is a moderate-sized hiatal hernia, unchanged. The colon is nondilated. The appendix is surgically absent. There is colonic diverticulosis without evidence for acute diverticulitis. Vascular/Lymphatic: Aortic atherosclerosis. No enlarged abdominal or pelvic lymph nodes. There is no evidence for portal venous gas. Reproductive: Status post hysterectomy. No adnexal masses. Other: There is trace free fluid in the pelvis. There is no focal abdominal wall hernia. There is no free intraperitoneal air. Musculoskeletal: L4-5 posterior fusion present. Degenerative changes affect the spine. IMPRESSION: 1. Small-bowel loops in the right lower quadrant are mildly dilated with wall thickening, surrounding inflammatory stranding and mesenteric edema. Questionable tiny amount of pneumatosis. Findings are concerning for small bowel ischemia or other nonspecific enteritis. No bowel obstruction, portal venous gas or free air. 2. Trace free fluid in the pelvis. 3. Colonic diverticulosis. 4. Stable moderate-sized hiatal hernia. Aortic Atherosclerosis (ICD10-I70.0). Electronically Signed   By: ARonney AstersM.D.   On: 05/16/2022 23:24    Anti-infectives: Anti-infectives (From admission, onward)  None        Assessment/Plan: Impression: Nonspecific abdominal issues.  As she is many weeks postoperatively from her laparoscopic appendectomy, her issues are probably related to some other etiology.  Her phlegmon in the right lower quadrant has resolved.  At this point, there is nothing more to offer from the surgical standpoint.  This was discussed with Dr. Manuella Ghazi.  May need GI consultation should she continue to have GI issues.  LOS: 0 days    Yesenia [name] 05/18/2022

## 2022-05-18 NOTE — Evaluation (Signed)
Physical Therapy Evaluation Patient Details Name: Yesenia [name] MRN: [id number] DOB: 11-08-1950 Today's Date: 05/18/2022  History of Present Illness  Yesenia [name] is a 72 y.o. female with medical history significant of anxiety, DVT, hypertension, GERD, hypothyroidism, fatty liver, PE, and more presents the ED with a chief complaint of abdominal pain.  Patient reports this is her third admission since January 15.  At that time she had an appendix rupture per her report.  She came back a couple days after discharge because she had increased pain and they did a CT scan which showed phlegmon.  Patient was started on antibiotics.  She was discharged on Saturday with Augmentin.  She returns today due to abdominal pain.  She reports she first noticed it the day before when she did not feel right and she had a crampy twisting pain.  She reports the pain was periumbilical.  It was no worse with p.o. intake and completely unpredictable for when it would wax and wane.  Patient reports no history of SBO.  She reports she had intermittent pain at first but it has become worse in frequency and intensity.  Patient reports nausea and vomiting x 1 that was normal bloody and appeared to be yellow.  No blood in her stool.  Patient is admitted for SBO   Clinical Impression  Patient demonstrates slightly labored movement for sitting up at bedside with [facility] flat, good return for transfers, walking in room/hallway without loss of balance with occasional leaning on side rails and tolerated sitting up at bedside after therapy.  Patient encouraged to ambulate in room ad lib and with nursing staff supervising in hallway.  Plan:  Patient discharged from physical therapy to care of nursing for ambulation daily as tolerated for length of stay.        Recommendations for follow up therapy are one component of a multi-disciplinary discharge planning process, led by the attending physician.  Recommendations may be updated based  on patient status, additional functional criteria and insurance authorization.  Follow Up Recommendations No PT follow up      Assistance Recommended at Discharge Set up Supervision/Assistance  Patient can return home with the following  Help with stairs or ramp for entrance;Assistance with cooking/housework    Equipment Recommendations None recommended by PT  Recommendations for Other Services       Functional Status Assessment Patient has had a recent decline in their functional status and/or demonstrates limited ability to make significant improvements in function in a reasonable and predictable amount of time     Precautions / Restrictions Precautions Precautions: None Restrictions Weight Bearing Restrictions: No      Mobility  Bed Mobility Overal bed mobility: Modified Independent             General bed mobility comments: HOB flat, had to use bed rail during supine to sitting    Transfers Overall transfer level: Modified independent                 General transfer comment: slightly labored movement    Ambulation/Gait Ambulation/Gait assistance: Modified independent (Device/Increase time) Gait Distance (Feet): 150 Feet Assistive device: None Gait Pattern/deviations: Decreased step length - right, Decreased step length - left, Decreased stride length Gait velocity: decreased     General Gait Details: slightly labored cadence with occasional leaning on side rails without loss of balance  Stairs            Wheelchair Mobility    Modified Rankin (Stroke  Patients Only)       Balance Overall balance assessment: Mild deficits observed, not formally tested                                           Pertinent Vitals/Pain Pain Assessment Pain Assessment: 0-10 Pain Score: 8  Pain Location: stomach at surgical site Pain Descriptors / Indicators: Sore, Discomfort Pain Intervention(s): Limited activity within patient's  tolerance, Monitored during session, Repositioned    Home Living Family/patient expects to be discharged to:: Private residence Living Arrangements: Children Available Help at Discharge: Family;Available 24 hours/day Type of Home: House Home Access: Stairs to enter   [employer] of Steps: 2   Home Layout: One level Home Equipment: Shower seat;Grab bars - tub/shower      Prior Function Prior Level of Function : Independent/Modified Independent             Mobility Comments: Community ambulator without AD, does not drive ADLs Comments: Independent other than son driving for her     Hand Dominance   Dominant Hand: Right    Extremity/Trunk Assessment   Upper Extremity Assessment Upper Extremity Assessment: Overall WFL for tasks assessed    Lower Extremity Assessment Lower Extremity Assessment: Overall WFL for tasks assessed    Cervical / Trunk Assessment Cervical / Trunk Assessment: Normal  Communication   Communication: No difficulties  Cognition Arousal/Alertness: Awake/alert Behavior During Therapy: WFL for tasks assessed/performed Overall Cognitive Status: Within Functional Limits for tasks assessed                                          General Comments      Exercises     Assessment/Plan    PT Assessment Patient does not need any further PT services  PT Problem List         PT Treatment Interventions      PT Goals (Current goals can be found in the Care Plan section)  Acute Rehab PT Goals Patient Stated Goal: return home with family to assist PT Goal Formulation: With patient Time For Goal Achievement: 05/18/22 Potential to Achieve Goals: Good    Frequency       Co-evaluation               AM-PAC PT "6 Clicks" Mobility  Outcome Measure Help needed turning from your back to your side while in a flat bed without using bedrails?: None Help needed moving from lying on your back to sitting on the side of  a flat bed without using bedrails?: None Help needed moving to and from a bed to a chair (including a wheelchair)?: None Help needed standing up from a chair using your arms (e.g., wheelchair or bedside chair)?: None Help needed to walk in hospital room?: None Help needed climbing 3-5 steps with a railing? : A Little 6 Click Score: 23    End of Session   Activity Tolerance: Patient tolerated treatment well Patient left: in bed;with call bell/phone within reach Nurse Communication: Mobility status PT Visit Diagnosis: Unsteadiness on feet (R26.81);Other abnormalities of gait and mobility (R26.89);Muscle weakness (generalized) (M62.81)    Time: [id number] PT Time Calculation (min) (ACUTE ONLY): 21 min   Charges:   PT Evaluation $PT Eval Moderate Complexity: 1 Mod PT Treatments $Therapeutic Activity:  8-22 mins        3:44 PM, 05/18/22 Lonell Grandchild, MPT Physical Therapist with [facility] [phone] office [phone] mobile phone

## 2022-05-18 NOTE — Plan of Care (Addendum)
Patient Yesenia [name], VSS throughout shift.  Tele monitor on.  All meds given on time as ordered.  Pt c/o pain and nausea relieved by PRN dilaudid and zofran respectively.  Pt standby assist to bathroom to void every hour this shift.  Skin intact.  POC maintained, will continue to monitor.  Problem: Education: Goal: Ability to describe self-care measures that may prevent or decrease complications (Diabetes Survival Skills Education) will improve 05/18/2022 0528 by Marjory Sneddon, RN Outcome: Progressing 05/18/2022 0528 by Marjory Sneddon, RN Outcome: Progressing Goal: Individualized Educational Video(s) 05/18/2022 0528 by Marjory Sneddon, RN Outcome: Progressing 05/18/2022 0528 by Marjory Sneddon, RN Outcome: Progressing   Problem: Coping: Goal: Ability to adjust to condition or change in health will improve 05/18/2022 0528 by Marjory Sneddon, RN Outcome: Progressing 05/18/2022 0528 by Marjory Sneddon, RN Outcome: Progressing   Problem: Fluid Volume: Goal: Ability to maintain a balanced intake and output will improve 05/18/2022 0528 by Marjory Sneddon, RN Outcome: Progressing 05/18/2022 0528 by Marjory Sneddon, RN Outcome: Progressing   Problem: Health Behavior/Discharge Planning: Goal: Ability to identify and utilize available resources and services will improve 05/18/2022 0528 by Marjory Sneddon, RN Outcome: Progressing 05/18/2022 0528 by Marjory Sneddon, RN Outcome: Progressing Goal: Ability to manage health-related needs will improve 05/18/2022 0528 by Marjory Sneddon, RN Outcome: Progressing 05/18/2022 0528 by Marjory Sneddon, RN Outcome: Progressing   Problem: Metabolic: Goal: Ability to maintain appropriate glucose levels will improve 05/18/2022 0528 by Marjory Sneddon, RN Outcome: Progressing 05/18/2022 0528 by Marjory Sneddon, RN Outcome: Progressing   Problem: Nutritional: Goal: Maintenance of adequate nutrition will improve 05/18/2022 0528 by Marjory Sneddon, RN Outcome: Progressing 05/18/2022 0528 by  Marjory Sneddon, RN Outcome: Progressing Goal: Progress toward achieving an optimal weight will improve 05/18/2022 0528 by Marjory Sneddon, RN Outcome: Progressing 05/18/2022 0528 by Marjory Sneddon, RN Outcome: Progressing   Problem: Skin Integrity: Goal: Risk for impaired skin integrity will decrease 05/18/2022 0528 by Marjory Sneddon, RN Outcome: Progressing 05/18/2022 0528 by Marjory Sneddon, RN Outcome: Progressing   Problem: Tissue Perfusion: Goal: Adequacy of tissue perfusion will improve 05/18/2022 0528 by Marjory Sneddon, RN Outcome: Progressing 05/18/2022 0528 by Marjory Sneddon, RN Outcome: Progressing   Problem: Education: Goal: Knowledge of General Education information will improve Description: Including pain rating scale, medication(s)/side effects and non-pharmacologic comfort measures 05/18/2022 0528 by Marjory Sneddon, RN Outcome: Progressing 05/18/2022 0528 by Marjory Sneddon, RN Outcome: Progressing   Problem: Health Behavior/Discharge Planning: Goal: Ability to manage health-related needs will improve 05/18/2022 0528 by Marjory Sneddon, RN Outcome: Progressing 05/18/2022 0528 by Marjory Sneddon, RN Outcome: Progressing   Problem: Clinical Measurements: Goal: Ability to maintain clinical measurements within normal limits will improve 05/18/2022 0528 by Marjory Sneddon, RN Outcome: Progressing 05/18/2022 0528 by Marjory Sneddon, RN Outcome: Progressing Goal: Will remain free from infection 05/18/2022 0528 by Marjory Sneddon, RN Outcome: Progressing 05/18/2022 0528 by Marjory Sneddon, RN Outcome: Progressing Goal: Diagnostic test results will improve 05/18/2022 0528 by Marjory Sneddon, RN Outcome: Progressing 05/18/2022 0528 by Marjory Sneddon, RN Outcome: Progressing Goal: Respiratory complications will improve Outcome: Progressing Goal: Cardiovascular complication will be avoided Outcome: Progressing   Problem: Activity: Goal: Risk for activity intolerance will decrease Outcome:  Progressing   Problem: Nutrition: Goal: Adequate nutrition will be maintained Outcome: Progressing   Problem: Coping: Goal: Level of anxiety will decrease Outcome: Progressing   Problem: Elimination: Goal: Will not experience complications related to bowel motility Outcome: Progressing Goal: Will not experience complications related to urinary retention Outcome: Progressing   Problem: Pain Managment: Goal:  General experience of comfort will improve Outcome: Progressing   Problem: Safety: Goal: Ability to remain free from injury will improve Outcome: Progressing   Problem: Skin Integrity: Goal: Risk for impaired skin integrity will decrease Outcome: Progressing

## 2022-05-18 NOTE — Progress Notes (Signed)
PROGRESS NOTE    DAYSIE HADDER  [id number] DOB: 04-Jul-1950 DOA: 05/16/2022 PCP: Johnette Abraham, MD   Brief Narrative:    Yesenia [name] is a 72 y.o. female with medical history significant for type 2 diabetes mellitus, send hypertension, GERD, acquired hypothyroidism, who is admitted to [facility] on 05/16/2022 with intractable nausea/vomiting after presenting from home to [facility] complaining of nausea/vomiting.  She was recently discharged on 2/9 with similar symptoms and has CT findings that are nonspecific.  She continues to have some issues with nausea and abdominal pain.  Assessment & Plan:   Principal Problem:   Intractable nausea and vomiting Active Problems:   GERD (gastroesophageal reflux disease)   Essential hypertension   Acquired hypothyroidism   Hypokalemia   Abdominal pain   DM2 (diabetes mellitus, type 2) (HCC)  Assessment and Plan:  Generalized abdominal pain with intractable nausea and vomiting Continue symptomatic management Continued trial of diet Appreciate ongoing general surgery recommendations and may need to get GI involved if not improving PT evaluation Restart IV fluid until dietary intake is reliable  Mild hypokalemia Replete and continue to monitor  Type 2 diabetes Monitor and use SSI Hold home metformin  Essential hypertension Continue home blood pressure medications  GERD PPI  Hypothyroidism TSH 21.842 Check free T4  Morbid obesity BMI 44.92    DVT prophylaxis: Lovenox Code Status: Full Family Communication: None at bedside Disposition Plan:  Status is: Observation The patient will require care spanning > 2 midnights and should be moved to inpatient because: Poor dietary intake, needs IV fluid   Consultants:  General surgery  Procedures:  None  Antimicrobials:  None   Subjective: Patient seen and evaluated today with ongoing complaints of nausea, but no vomiting.  Continues to have abdominal pain  and has not eaten.  Objective: Vitals:   05/17/22 1323 05/17/22 2003 05/18/22 0400 05/18/22 0553  BP: (!) 147/81 (!) 160/81 (!) 135/96   Pulse: 86 87 79   Resp:  17 17   Temp: 98.4 F (36.9 C) 98.8 F (37.1 C) 98.2 F (36.8 C)   TempSrc: Oral Oral Oral   SpO2: 93% 95% 92%   Weight:    111.4 kg  Height:        Intake/Output Summary (Last 24 hours) at 05/18/2022 1100 Last data filed at 05/18/2022 0900 Gross per 24 hour  Intake 440 ml  Output 1500 ml  Net -1060 ml   Filed Weights   05/16/22 2123 05/18/22 0553  Weight: 120 kg 111.4 kg    Examination:  General exam: Appears calm and comfortable, obese Respiratory system: Clear to auscultation. Respiratory effort normal. Cardiovascular system: S1 & S2 heard, RRR.  Gastrointestinal system: Abdomen is soft Central nervous system: Alert and awake Extremities: No edema Skin: No significant lesions noted Psychiatry: Flat affect.    Data Reviewed: I have personally reviewed following labs and imaging studies  CBC: Recent Labs  Lab 05/13/22 0257 05/14/22 0532 05/15/22 0502 05/16/22 2211 05/17/22 0615 05/18/22 0708  WBC 6.3 5.0 5.2 5.1 5.0 5.5  NEUTROABS 4.5  --   --  3.7 3.0  --   HGB 9.5* 8.3* 8.5* 9.8* 9.3* 9.2*  HCT 33.3* 29.6* 30.9* 34.3* 33.1* 32.7*  MCV 85.2 86.3 86.6 84.3 84.2 84.9  PLT 372 300 294 388 385 [id number]   Basic Metabolic Panel: Recent Labs  Lab 05/13/22 0257 05/14/22 0532 05/15/22 0502 05/16/22 2211 05/17/22 0032 05/17/22 0615 05/18/22 0708  NA 137 139  139 138  --  137 135  K 3.6 3.1* 3.6 3.2*  --  3.7 3.2*  CL 104 104 107 102  --  106 102  CO2 25 27 25 25  $ --  23 24  GLUCOSE 118* 94 101* 139*  --  112* 116*  BUN 10 8 7* 5*  --  <5* <5*  CREATININE 0.82 0.75 0.75 0.84  --  0.69 0.76  CALCIUM 9.2 8.3* 8.3* 9.1  --  8.8* 8.4*  MG 1.7  --  1.7  --  1.8 1.7 1.7  PHOS  --   --  2.5  --   --   --   --    GFR: Estimated Creatinine Clearance: 76 mL/min (by C-G formula based on SCr of 0.76  mg/dL). Liver Function Tests: Recent Labs  Lab 05/12/22 1846 05/13/22 0257 05/17/22 0615 05/18/22 0708  AST 17 14* 18 16  ALT 10 8 9 9  $ ALKPHOS 68 55 54 51  BILITOT 0.4 0.4 0.2* 0.6  PROT 8.3* 6.7 6.7 6.2*  ALBUMIN 3.3* 2.7* 2.9* 2.7*   Recent Labs  Lab 05/12/22 1846  LIPASE 38   No results for input(s): "AMMONIA" in the last 168 hours. Coagulation Profile: No results for input(s): "INR", "PROTIME" in the last 168 hours. Cardiac Enzymes: No results for input(s): "CKTOTAL", "CKMB", "CKMBINDEX", "TROPONINI" in the last 168 hours. BNP (last 3 results) No results for input(s): "PROBNP" in the last 8760 hours. HbA1C: No results for input(s): "HGBA1C" in the last 72 hours. CBG: Recent Labs  Lab 05/17/22 1209 05/17/22 2005 05/18/22 0012 05/18/22 0558 05/18/22 0733  GLUCAP 91 111* 119* 113* 101*   Lipid Profile: No results for input(s): "CHOL", "HDL", "LDLCALC", "TRIG", "CHOLHDL", "LDLDIRECT" in the last 72 hours. Thyroid Function Tests: Recent Labs    05/17/22 0615  TSH 21.842*   Anemia Panel: No results for input(s): "VITAMINB12", "FOLATE", "FERRITIN", "TIBC", "IRON", "RETICCTPCT" in the last 72 hours. Sepsis Labs: Recent Labs  Lab 05/17/22 0032  LATICACIDVEN 0.8    Recent Results (from the past 240 hour(s))  Gastrointestinal Panel by PCR , Stool     Status: None   Collection Time: 05/12/22  8:55 PM   Specimen: Stool  Result Value Ref Range Status   Campylobacter species NOT DETECTED NOT DETECTED Final   Plesimonas shigelloides NOT DETECTED NOT DETECTED Final   Salmonella species NOT DETECTED NOT DETECTED Final   Yersinia enterocolitica NOT DETECTED NOT DETECTED Final   Vibrio species NOT DETECTED NOT DETECTED Final   Vibrio cholerae NOT DETECTED NOT DETECTED Final   Enteroaggregative E coli (EAEC) NOT DETECTED NOT DETECTED Final   Enteropathogenic E coli (EPEC) NOT DETECTED NOT DETECTED Final   Enterotoxigenic E coli (ETEC) NOT DETECTED NOT DETECTED  Final   Shiga like toxin producing E coli (STEC) NOT DETECTED NOT DETECTED Final   Shigella/Enteroinvasive E coli (EIEC) NOT DETECTED NOT DETECTED Final   Cryptosporidium NOT DETECTED NOT DETECTED Final   Cyclospora cayetanensis NOT DETECTED NOT DETECTED Final   Entamoeba histolytica NOT DETECTED NOT DETECTED Final   Giardia lamblia NOT DETECTED NOT DETECTED Final   Adenovirus F40/41 NOT DETECTED NOT DETECTED Final   Astrovirus NOT DETECTED NOT DETECTED Final   Norovirus GI/GII NOT DETECTED NOT DETECTED Final   Rotavirus A NOT DETECTED NOT DETECTED Final   Sapovirus (I, II, IV, and V) NOT DETECTED NOT DETECTED Final    Comment: Performed at [facility], [address] [place], [place] [address]  C Difficile Quick Screen w PCR reflex     Status: Abnormal   Collection Time: 05/12/22  8:55 PM   Specimen: Stool  Result Value Ref Range Status   C Diff antigen POSITIVE (A) NEGATIVE Final   C Diff toxin NEGATIVE NEGATIVE Final   C Diff interpretation Results are indeterminate. See PCR results.  Final    Comment: RESULT CALLED TO, READ BACK BY AND VERIFIED WITH: R.THOMPSON AT 2205 ON 02.06.24 BY ADGER J  Performed at [facility], [address] [place], [place] [address]   C. Diff by PCR, Reflexed     Status: None   Collection Time: 05/12/22  8:55 PM  Result Value Ref Range Status   Toxigenic C. Difficile by PCR NEGATIVE NEGATIVE Final    Comment: Patient is colonized with non toxigenic C. difficile. May not need treatment unless significant symptoms are present. Performed at [facility], [place] [address], [place], [place] [address]          Radiology Studies: CT ABDOMEN PELVIS W CONTRAST  Result Date: 05/16/2022 CLINICAL DATA:  Acute abdominal pain. EXAM: CT ABDOMEN AND PELVIS WITH CONTRAST TECHNIQUE: Multidetector CT imaging of the abdomen and pelvis was performed using the standard protocol following bolus administration of intravenous contrast. RADIATION DOSE  REDUCTION: This exam was performed according to the departmental dose-optimization program which includes automated exposure control, adjustment of the mA and/or kV according to patient size and/or use of iterative reconstruction technique. CONTRAST:  130m OMNIPAQUE IOHEXOL 300 MG/ML  SOLN COMPARISON:  CT abdomen and pelvis 05/12/2022 FINDINGS: Lower chest: No acute abnormality. Hepatobiliary: No focal liver abnormality is seen. Status post cholecystectomy. No biliary dilatation. Pancreas: Unremarkable. No pancreatic ductal dilatation or surrounding inflammatory changes. Spleen: Normal in size without focal abnormality. Adrenals/Urinary Tract: There are bilateral renal cysts measuring up to 2.2 cm. There is no hydronephrosis or perinephric fluid. The adrenal glands and bladder are within normal limits. Stomach/Bowel: There are small bowel loops in the right lower quadrant which are mildly dilated with diffuse wall thickening, surrounding inflammatory stranding and mesenteric edema. Questionable tiny amount of pneumatosis seen on axial image 69 and 70. There are adjacent small lymph nodes. Proximal small bowel is nondilated. Stomach is nondilated. There is a moderate-sized hiatal hernia, unchanged. The colon is nondilated. The appendix is surgically absent. There is colonic diverticulosis without evidence for acute diverticulitis. Vascular/Lymphatic: Aortic atherosclerosis. No enlarged abdominal or pelvic lymph nodes. There is no evidence for portal venous gas. Reproductive: Status post hysterectomy. No adnexal masses. Other: There is trace free fluid in the pelvis. There is no focal abdominal wall hernia. There is no free intraperitoneal air. Musculoskeletal: L4-5 posterior fusion present. Degenerative changes affect the spine. IMPRESSION: 1. Small-bowel loops in the right lower quadrant are mildly dilated with wall thickening, surrounding inflammatory stranding and mesenteric edema. Questionable tiny amount of  pneumatosis. Findings are concerning for small bowel ischemia or other nonspecific enteritis. No bowel obstruction, portal venous gas or free air. 2. Trace free fluid in the pelvis. 3. Colonic diverticulosis. 4. Stable moderate-sized hiatal hernia. Aortic Atherosclerosis (ICD10-I70.0). Electronically Signed   By: ARonney AstersM.D.   On: 05/16/2022 23:24        Scheduled Meds:  acetaminophen  1,000 mg Oral Q6H   amitriptyline  10 mg Oral QHS   amLODipine  2.5 mg Oral Daily   insulin aspart  0-6 Units Subcutaneous Q6H   [START ON 05/19/2022] linaclotide  290 mcg Oral QAC breakfast  metoprolol succinate  50 mg Oral Daily   pantoprazole (PROTONIX) IV  40 mg Intravenous Q24H   potassium chloride  40 mEq Oral Once    LOS: 0 days    Time spent: 35 minutes    Roy Snuffer Darleen Crocker, DO [facility]  If 7PM-7AM, please contact night-coverage [email or web address] 05/18/2022, 11:00 AM

## 2022-05-19 ENCOUNTER — Encounter: Payer: Medicare HMO | Admitting: General Surgery

## 2022-05-19 DIAGNOSIS — R112 Nausea with vomiting, unspecified: Secondary | ICD-10-CM | POA: Diagnosis not present

## 2022-05-19 LAB — GLUCOSE, CAPILLARY
Glucose-Capillary: 101 mg/dL — ABNORMAL HIGH (ref 70–99)
Glucose-Capillary: 107 mg/dL — ABNORMAL HIGH (ref 70–99)
Glucose-Capillary: 114 mg/dL — ABNORMAL HIGH (ref 70–99)
Glucose-Capillary: 120 mg/dL — ABNORMAL HIGH (ref 70–99)
Glucose-Capillary: 141 mg/dL — ABNORMAL HIGH (ref 70–99)

## 2022-05-19 LAB — MAGNESIUM: Magnesium: 1.7 mg/dL (ref 1.7–2.4)

## 2022-05-19 LAB — BASIC METABOLIC PANEL
Anion gap: 9 (ref 5–15)
BUN: 6 mg/dL — ABNORMAL LOW (ref 8–23)
CO2: 25 mmol/L (ref 22–32)
Calcium: 8.9 mg/dL (ref 8.9–10.3)
Chloride: 102 mmol/L (ref 98–111)
Creatinine, Ser: 0.87 mg/dL (ref 0.44–1.00)
GFR, Estimated: >60 mL/min (ref >60)
Glucose, Bld: 107 mg/dL — ABNORMAL HIGH (ref 70–99)
Potassium: 3.3 mmol/L — ABNORMAL LOW (ref 3.5–5.1)
Sodium: 136 mmol/L (ref 135–145)

## 2022-05-19 MED ORDER — OXYCODONE HCL 5 MG PO TABS
5.0000 mg | ORAL_TABLET | Freq: Once | ORAL | Status: AC
  Administered 2022-05-19: 5 mg via ORAL
  Filled 2022-05-19: qty 1

## 2022-05-19 MED ORDER — POTASSIUM CHLORIDE CRYS ER 20 MEQ PO TBCR
40.0000 meq | EXTENDED_RELEASE_TABLET | Freq: Once | ORAL | Status: AC
  Administered 2022-05-19: 40 meq via ORAL
  Filled 2022-05-19: qty 2

## 2022-05-19 MED ORDER — PROCHLORPERAZINE EDISYLATE 10 MG/2ML IJ SOLN
10.0000 mg | Freq: Once | INTRAMUSCULAR | Status: AC
  Administered 2022-05-19: 10 mg via INTRAVENOUS
  Filled 2022-05-19: qty 2

## 2022-05-19 MED ORDER — POLYETHYLENE GLYCOL 3350 17 G PO PACK
17.0000 g | PACK | Freq: Every day | ORAL | Status: DC
  Administered 2022-05-19 – 2022-05-20 (×2): 17 g via ORAL
  Filled 2022-05-19 (×2): qty 1

## 2022-05-19 NOTE — TOC Initial Note (Signed)
Transition of Care [facility]) - Initial/Assessment Note    Patient Details  Name: Yesenia [name] MRN: [id number] Date of Birth: 02/26/1951  [facility] of [facility]) CM/SW Contact:    Iona Beard, [place] Phone Number: 05/19/2022, 1:23 PM  Clinical Narrative:                 CSW updated that pt may be interested in SNF at D/C. CSW met with pt at bedside to explain that PT is recommending no PT follow up as she walked independently 150 ft. CSW explained that pt could private pay for SNF if she was interested in this, pt states that will not be an option. CSW reviewed HH options, pt is agreeable. Pt has been active with [facility] in the past. CSW spoke to [name] with [facility] who accepts pts referral for [facility] PT/RN/Aide services. CSW to request that MD place [facility] orders. TOC to follow.   Expected Discharge Plan: [place] Barriers to Discharge: Continued Medical Work up   Patient Goals and CMS Choice Patient states their goals for this hospitalization and ongoing recovery are:: reutrn home CMS Medicare.gov Compare Post Acute Care list provided to:: Patient Choice offered to / list presented to : Patient      Expected Discharge Plan and Services In-house Referral: Clinical Social Work Discharge Planning Services: CM Consult   Living arrangements for the past 2 months: Single Family Home                                      Prior Living Arrangements/Services Living arrangements for the past 2 months: Single Family Home Lives with:: Adult Children Patient language and need for interpreter reviewed:: Yes Do you feel safe going back to the place where you live?: Yes      Need for Family Participation in Patient Care: Yes (Comment) Care giver support system in place?: Yes (comment)   Criminal Activity/Legal Involvement Pertinent to Current Situation/Hospitalization: No - Comment as needed  Activities of Daily Living Home Assistive Devices/Equipment: Eyeglasses ADL  Screening (condition at time of admission) Patient's cognitive ability adequate to safely complete daily activities?: Yes Is the patient deaf or have difficulty hearing?: No Does the patient have difficulty seeing, even when wearing glasses/contacts?: No Does the patient have difficulty concentrating, remembering, or making decisions?: No Patient able to express need for assistance with ADLs?: Yes Does the patient have difficulty dressing or bathing?: No Independently performs ADLs?: Yes (appropriate for developmental age) Does the patient have difficulty walking or climbing stairs?: No Weakness of Legs: None Weakness of Arms/Hands: None  Permission Sought/Granted                  Emotional Assessment Appearance:: Appears stated age Attitude/Demeanor/Rapport: Engaged Affect (typically observed): Accepting Orientation: : Oriented to Self, Oriented to Place, Oriented to  Time, Oriented to Situation Alcohol / Substance Use: Not Applicable Psych Involvement: No (comment)  Admission diagnosis:  Enteritis [K52.9] Lower abdominal pain [R10.30] Intractable nausea and vomiting [R11.2] Patient Active Problem List   Diagnosis Date Noted   Intractable nausea and vomiting 05/17/2022   DM2 (diabetes mellitus, type 2) ([place]) 05/17/2022   Diarrhea 05/13/2022   Intractable abdominal pain 05/12/2022   Hypoalbuminemia due to protein-calorie malnutrition ([place]) 05/05/2022   Abdominal pain 05/04/2022   Sepsis ([place]) 04/20/2022   Perforated appendicitis 04/19/2022   Acute appendicitis with appendiceal abscess 04/19/2022  Chest pain 08/09/2020   Low back pain 07/24/2020   Thigh pain 03/06/2020   Heart murmur [id number]   Diastolic dysfunction [id number]   Hypokalemia 12/24/2016   Osteoporosis 02/18/2016   Proximal humerus fracture 01/12/2016   Fatty liver 09/04/2015   SBO (small bowel obstruction) (HCC)    Neurotic excoriations 05/14/2015   Constipation 05/09/2015   History of colonic  polyps    Diverticulosis of colon without hemorrhage    Mucosal abnormality of stomach    Mucosal abnormality of esophagus    Reflux esophagitis    Dysphagia    FH: colon cancer 12/19/2014   Hyperlipemia 09/06/2013   MDD (major depressive disorder) 09/06/2013   Previous back surgery 11/04/2012   DVT (deep venous thrombosis) ([place])    PE (pulmonary embolism)    CKD (chronic kidney disease), stage II 05/17/2012   Diabetic neuropathy ([place]) 03/18/2012   Sleep apnea 10/28/2011   Type II diabetes mellitus with neurological manifestations ([place]) 10/28/2011   Fibromyalgia 10/20/2011   Essential hypertension 09/08/2011   Acquired hypothyroidism 09/08/2011   DDD (degenerative disc disease), lumbar 09/08/2011   Obesity, Class III, BMI 40-49.9 (morbid obesity) ([place]) 09/08/2011   Anxiety 09/08/2011   GERD (gastroesophageal reflux disease) 01/08/2010   SPINAL STENOSIS, LUMBAR 09/16/2009   PCP:  Johnette Abraham, MD Pharmacy:   [place] [place] [address] [place] [address] Phone: [phone] Fax: [phone]     Social Determinants of Health (SDOH) Social History: SDOH Screenings   Food Insecurity: No Food Insecurity (05/17/2022)  Housing: Low Risk  (05/17/2022)  Transportation Needs: No Transportation Needs (05/17/2022)  Utilities: Not At Risk (05/17/2022)  Alcohol Screen: Low Risk  (05/22/2020)  Depression (PHQ2-9): Medium Risk (05/22/2020)  Financial Resource Strain: Low Risk  (02/21/2020)  Tobacco Use: Low Risk  (05/17/2022)   SDOH Interventions:     Readmission Risk Interventions    05/13/2022   12:12 PM 05/06/2022    9:01 AM 04/23/2022    2:39 PM  Readmission Risk Prevention Plan  Post Dischage Appt   Not Complete  Medication Screening Complete  Complete  Transportation Screening Complete Complete Complete  PCP or Specialist Appt within 5-7 Days  Complete   Home Care Screening  Complete   Medication Review (RN CM)  Complete

## 2022-05-19 NOTE — Progress Notes (Signed)
  Subjective: Still complaining of intermittent nausea.  Has not had a bowel movement in 48 hours.  Patient is passing flatus.  Minimal abdominal pain.  Objective: Vital signs in last 24 hours: Temp:  [98.2 F (36.8 C)-98.4 F (36.9 C)] 98.2 F (36.8 C) (02/13 0554) Pulse Rate:  [70-84] 70 (02/13 0554) Resp:  [17-18] 17 (02/13 0554) BP: (155-162)/(81-87) 155/81 (02/13 0554) SpO2:  [92 %-96 %] 92 % (02/13 0554) Weight:  [109.2 kg] 109.2 kg (02/13 0554) Last BM Date : 05/16/22  Intake/Output from previous day: 02/12 0701 - 02/13 0700 In: 613.2 [P.O.:360; I.V.:253.2] Out: 1300 [Urine:1300] Intake/Output this shift: No intake/output data recorded.  General appearance: alert, cooperative, and no distress GI: Soft without any particular point tenderness.  Bowel sounds appreciated.  Lab Results:  Recent Labs    05/17/22 0615 05/18/22 0708  WBC 5.0 5.5  HGB 9.3* 9.2*  HCT 33.1* 32.7*  PLT 385 357   BMET Recent Labs    05/18/22 0708 05/19/22 0446  NA 135 136  K 3.2* 3.3*  CL 102 102  CO2 24 25  GLUCOSE 116* 107*  BUN <5* 6*  CREATININE 0.76 0.87  CALCIUM 8.4* 8.9   PT/INR No results for input(s): "LABPROT", "INR" in the last 72 hours.  Studies/Results: No results found.  Anti-infectives: Anti-infectives (From admission, onward)    None       Assessment/Plan: Impression: Suspect a level of IBS.  Will start MiraLAX.  Will follow peripherally with you.  May need GI consultation for follow-up.  LOS: 1 day    Aviva Signs 05/19/2022

## 2022-05-19 NOTE — Progress Notes (Signed)
Pt complain on pain & nausea a couple of times throughout night. Pt was given pain & nausea medicine. Restarted patient on IV fluids and noticed IV catheter was exposed. Fixed problem & redressed IV. Otherwise patient seemed to have rested okay during the night.

## 2022-05-19 NOTE — Progress Notes (Signed)
PROGRESS NOTE    Yesenia [name]  [id number] DOB: 1950/09/27 DOA: 05/16/2022 PCP: Johnette Abraham, MD   Brief Narrative:    Yesenia [name] is a 72 y.o. female with medical history significant for type 2 diabetes mellitus, send hypertension, GERD, acquired hypothyroidism, who is admitted to [facility] on 05/16/2022 with intractable nausea/vomiting after presenting from home to [facility] complaining of nausea/vomiting.  She was recently discharged on 2/9 with similar symptoms and has CT findings that are nonspecific.  She continues to have some issues with nausea and abdominal pain.  Assessment & Plan:   Principal Problem:   Intractable nausea and vomiting Active Problems:   GERD (gastroesophageal reflux disease)   Essential hypertension   Acquired hypothyroidism   Hypokalemia   Abdominal pain   DM2 (diabetes mellitus, type 2) (HCC)  Assessment and Plan:  Generalized abdominal pain with intractable nausea and vomiting Continue symptomatic management Continued trial of diet Appreciate ongoing general surgery recommendations and may need to get GI involved if not improving PT evaluation not recommending SNF at this time Restart IV fluid until dietary intake is reliable MiraLAX started per general surgery 2/13 due to no bowel movement  Mild hypokalemia Replete and continue to monitor  Type 2 diabetes Monitor and use SSI Hold home metformin  Essential hypertension Continue home blood pressure medications  GERD PPI  Hypothyroidism TSH 21.842 Free T4 within normal limits  Morbid obesity BMI 44.92    DVT prophylaxis: Lovenox Code Status: Full Family Communication: None at bedside Disposition Plan:  Status is: Inpatient Remains inpatient appropriate because: Need for ongoing IV fluid.    Consultants:  General surgery  Procedures:  None  Antimicrobials:  None   Subjective: Patient seen and evaluated today with ongoing complaints of nausea,  but no vomiting.  Continues to have abdominal pain and has not eaten.  No bowel movement, but is passing flatus.  Objective: Vitals:   05/18/22 0553 05/18/22 1334 05/18/22 1958 05/19/22 0554  BP:  (!) 162/87 (!) 155/85 (!) 155/81  Pulse:  84 79 70  Resp:  18 18 17  $ Temp:  98.3 F (36.8 C) 98.4 F (36.9 C) 98.2 F (36.8 C)  TempSrc:  Oral Oral Oral  SpO2:  96% 94% 92%  Weight: 111.4 kg   109.2 kg  Height:        Intake/Output Summary (Last 24 hours) at 05/19/2022 0930 Last data filed at 05/19/2022 0400 Gross per 24 hour  Intake 373.24 ml  Output 300 ml  Net 73.24 ml   Filed Weights   05/16/22 2123 05/18/22 0553 05/19/22 0554  Weight: 120 kg 111.4 kg 109.2 kg    Examination:  General exam: Appears calm and comfortable, obese Respiratory system: Clear to auscultation. Respiratory effort normal. Cardiovascular system: S1 & S2 heard, RRR.  Gastrointestinal system: Abdomen is soft Central nervous system: Alert and awake Extremities: No edema Skin: No significant lesions noted Psychiatry: Flat affect.    Data Reviewed: I have personally reviewed following labs and imaging studies  CBC: Recent Labs  Lab 05/13/22 0257 05/14/22 0532 05/15/22 0502 05/16/22 2211 05/17/22 0615 05/18/22 0708  WBC 6.3 5.0 5.2 5.1 5.0 5.5  NEUTROABS 4.5  --   --  3.7 3.0  --   HGB 9.5* 8.3* 8.5* 9.8* 9.3* 9.2*  HCT 33.3* 29.6* 30.9* 34.3* 33.1* 32.7*  MCV 85.2 86.3 86.6 84.3 84.2 84.9  PLT 372 300 294 388 385 [id number]   Basic Metabolic Panel: Recent Labs  Lab 05/15/22 0502 05/16/22 2211 05/17/22 0032 05/17/22 0615 05/18/22 0708 05/19/22 0446  NA 139 138  --  137 135 136  K 3.6 3.2*  --  3.7 3.2* 3.3*  CL 107 102  --  106 102 102  CO2 25 25  --  23 24 25  $ GLUCOSE 101* 139*  --  112* 116* 107*  BUN 7* 5*  --  <5* <5* 6*  CREATININE 0.75 0.84  --  0.69 0.76 0.87  CALCIUM 8.3* 9.1  --  8.8* 8.4* 8.9  MG 1.7  --  1.8 1.7 1.7 1.7  PHOS 2.5  --   --   --   --   --    GFR: Estimated  Creatinine Clearance: 69 mL/min (by C-G formula based on SCr of 0.87 mg/dL). Liver Function Tests: Recent Labs  Lab 05/12/22 1846 05/13/22 0257 05/17/22 0615 05/18/22 0708  AST 17 14* 18 16  ALT 10 8 9 9  $ ALKPHOS 68 55 54 51  BILITOT 0.4 0.4 0.2* 0.6  PROT 8.3* 6.7 6.7 6.2*  ALBUMIN 3.3* 2.7* 2.9* 2.7*   Recent Labs  Lab 05/12/22 1846  LIPASE 38   No results for input(s): "AMMONIA" in the last 168 hours. Coagulation Profile: No results for input(s): "INR", "PROTIME" in the last 168 hours. Cardiac Enzymes: No results for input(s): "CKTOTAL", "CKMB", "CKMBINDEX", "TROPONINI" in the last 168 hours. BNP (last 3 results) No results for input(s): "PROBNP" in the last 8760 hours. HbA1C: No results for input(s): "HGBA1C" in the last 72 hours. CBG: Recent Labs  Lab 05/18/22 1107 05/18/22 1626 05/18/22 2226 05/19/22 0003 05/19/22 0556  GLUCAP 111* 108* 113* 107* 101*   Lipid Profile: No results for input(s): "CHOL", "HDL", "LDLCALC", "TRIG", "CHOLHDL", "LDLDIRECT" in the last 72 hours. Thyroid Function Tests: Recent Labs    05/17/22 0615 05/18/22 1131  TSH 21.842*  --   FREET4  --  0.75   Anemia Panel: No results for input(s): "VITAMINB12", "FOLATE", "FERRITIN", "TIBC", "IRON", "RETICCTPCT" in the last 72 hours. Sepsis Labs: Recent Labs  Lab 05/17/22 0032  LATICACIDVEN 0.8    Recent Results (from the past 240 hour(s))  Gastrointestinal Panel by PCR , Stool     Status: None   Collection Time: 05/12/22  8:55 PM   Specimen: Stool  Result Value Ref Range Status   Campylobacter species NOT DETECTED NOT DETECTED Final   Plesimonas shigelloides NOT DETECTED NOT DETECTED Final   Salmonella species NOT DETECTED NOT DETECTED Final   Yersinia enterocolitica NOT DETECTED NOT DETECTED Final   Vibrio species NOT DETECTED NOT DETECTED Final   Vibrio cholerae NOT DETECTED NOT DETECTED Final   Enteroaggregative E coli (EAEC) NOT DETECTED NOT DETECTED Final    Enteropathogenic E coli (EPEC) NOT DETECTED NOT DETECTED Final   Enterotoxigenic E coli (ETEC) NOT DETECTED NOT DETECTED Final   Shiga like toxin producing E coli (STEC) NOT DETECTED NOT DETECTED Final   Shigella/Enteroinvasive E coli (EIEC) NOT DETECTED NOT DETECTED Final   Cryptosporidium NOT DETECTED NOT DETECTED Final   Cyclospora cayetanensis NOT DETECTED NOT DETECTED Final   Entamoeba histolytica NOT DETECTED NOT DETECTED Final   Giardia lamblia NOT DETECTED NOT DETECTED Final   Adenovirus F40/41 NOT DETECTED NOT DETECTED Final   Astrovirus NOT DETECTED NOT DETECTED Final   Norovirus GI/GII NOT DETECTED NOT DETECTED Final   Rotavirus A NOT DETECTED NOT DETECTED Final   Sapovirus (I, II, IV, and V) NOT DETECTED NOT DETECTED Final  Comment: Performed at [facility], [place], [place] [address]  C Difficile Quick Screen w PCR reflex     Status: Abnormal   Collection Time: 05/12/22  8:55 PM   Specimen: Stool  Result Value Ref Range Status   C Diff antigen POSITIVE (A) NEGATIVE Final   C Diff toxin NEGATIVE NEGATIVE Final   C Diff interpretation Results are indeterminate. See PCR results.  Final    Comment: RESULT CALLED TO, READ BACK BY AND VERIFIED WITH: R.THOMPSON AT 2205 ON 02.06.24 BY ADGER J  Performed at [facility], [address] [place], [place] [address]   C. Diff by PCR, Reflexed     Status: None   Collection Time: 05/12/22  8:55 PM  Result Value Ref Range Status   Toxigenic C. Difficile by PCR NEGATIVE NEGATIVE Final    Comment: Patient is colonized with non toxigenic C. difficile. May not need treatment unless significant symptoms are present. Performed at [facility], [place] [address], [place], [place] [address]          Radiology Studies: No results found.      Scheduled Meds:  acetaminophen  1,000 mg Oral Q6H   amitriptyline  10 mg Oral QHS   amLODipine  2.5 mg Oral Daily   enoxaparin (LOVENOX) injection  50 mg  Subcutaneous Q24H   insulin aspart  0-6 Units Subcutaneous Q6H   linaclotide  290 mcg Oral QAC breakfast   metoprolol succinate  50 mg Oral Daily   pantoprazole (PROTONIX) IV  40 mg Intravenous Q24H   polyethylene glycol  17 g Oral Daily    LOS: 1 day    Time spent: 35 minutes    Thekla Colborn Darleen Crocker, DO [facility]  If 7PM-7AM, please contact night-coverage [email or web address] 05/19/2022, 9:30 AM

## 2022-05-19 NOTE — Progress Notes (Signed)
Pt ambulated about 90 yards without difficulty. Nad. No needs at this time

## 2022-05-20 DIAGNOSIS — R112 Nausea with vomiting, unspecified: Secondary | ICD-10-CM | POA: Diagnosis not present

## 2022-05-20 LAB — GLUCOSE, CAPILLARY
Glucose-Capillary: 109 mg/dL — ABNORMAL HIGH (ref 70–99)
Glucose-Capillary: 98 mg/dL (ref 70–99)

## 2022-05-20 MED ORDER — ONDANSETRON HCL 4 MG PO TABS: 4.0000 mg | ORAL_TABLET | Freq: Three times a day (TID) | ORAL | 1 refills | Status: DC | PRN

## 2022-05-20 MED ORDER — POLYETHYLENE GLYCOL 3350 17 G PO PACK: 17.0000 g | PACK | Freq: Every day | ORAL | 0 refills | Status: DC

## 2022-05-20 NOTE — Care Management Important Message (Signed)
Important Message  Patient Details  Name: Yesenia [name] MRN: [id number] Date of Birth: Nov 07, 1950   Medicare Important Message Given:  Yes     Tommy Medal 05/20/2022, 11:36 AM

## 2022-05-20 NOTE — Discharge Summary (Signed)
Physician Discharge Summary  Yesenia [name] [id number] DOB: [birth year] DOA: 05/16/2022  PCP: Johnette Abraham, MD  Admit date: 05/16/2022  Discharge date: 05/20/2022  Admitted From:Home  Disposition:  Home  Recommendations for Outpatient Follow-up:  Follow up with PCP in 1-2 weeks Follow-up with GI outpatient with referral sent due to concerns for ongoing abdominal pain with intractable nausea and vomiting Follow-up with general surgery, Dr. Arnoldo Morale which will be scheduled Continue medications as noted below  Home Health: Yes with PT/RN/aide  Equipment/Devices: None  Discharge Condition:Stable  CODE STATUS: Full  Diet recommendation: Heart Healthy/carb modified  Brief/Interim Summary:  Yesenia [name] is a 72 y.o. female with medical history significant for type 2 diabetes mellitus, send hypertension, GERD, acquired hypothyroidism, who is admitted to [facility] on 05/16/2022 with intractable nausea/vomiting after presenting from home to [facility] complaining of nausea/vomiting.  She was recently discharged on 2/9 with similar symptoms and has CT findings that are nonspecific.  She had ongoing issues with nausea and vomiting and poor oral intake along with abdominal pain that gradually stabilized.  She continues to have some ongoing symptoms, but there is nothing to treat and she appears to have reached maximal medical benefit during the course of this hospitalization.  She was followed by general surgery and they will follow outpatient and recommendation has been for GI to evaluate in the outpatient setting as well.  She is a high risk for readmission, but there is nothing else to offer at this point.  Discharge Diagnoses:  Principal Problem:   Intractable nausea and vomiting Active Problems:   GERD (gastroesophageal reflux disease)   Essential hypertension   Acquired hypothyroidism   Hypokalemia   Abdominal pain   DM2 (diabetes mellitus, type 2) ([place])  Principal  discharge diagnosis: Generalized abdominal pain with intractable nausea and vomiting of undetermined significance.  Discharge Instructions  Discharge Instructions     Ambulatory referral to Gastroenterology   Complete by: As directed    Abdominal pain with intractable n/v.   What is the reason for referral?: Other   Diet - low sodium heart healthy   Complete by: As directed    Increase activity slowly   Complete by: As directed       Allergies as of 05/20/2022       Reactions   Aspirin Other (See Comments)   G.I. Upset    Nalbuphine Itching, Swelling, Rash   Nubaine        Medication List     TAKE these medications    amitriptyline 10 MG tablet Commonly known as: ELAVIL Take 10 mg by mouth at bedtime.   amLODipine 2.5 MG tablet Commonly known as: NORVASC Take 2.5 mg by mouth daily.   Blood Glucose System Pak Kit Please dispense as Accu Chek Guide. USE TO TEST BLOOD SUGAR 3 TIMES DAILY. Dx: E11.65.   BLOOD GLUCOSE TEST STRIPS Strp Please dispense as Accu Chek Guide. USE TO TEST BLOOD SUGAR 3 TIMES DAILY. Dx: E11.65   calcium carbonate 500 MG chewable tablet Commonly known as: Tums Chew 1 tablet (200 mg of elemental calcium total) by mouth 2 (two) times daily.   dexlansoprazole 60 MG capsule Commonly known as: DEXILANT TAKE 1 CAPSULE DAILY.   docusate sodium 100 MG capsule Commonly known as: Colace Take 1 capsule (100 mg total) by mouth 2 (two) times daily.   furosemide 40 MG tablet Commonly known as: LASIX TAKE (1) TABLET BY MOUTH ONCE DAILY AS NEEDED. What changed:  See the new instructions.   HYDROcodone-acetaminophen 5-325 MG tablet Commonly known as: Norco Take 1 tablet by mouth every 6 (six) hours as needed for moderate pain.   Lancet Devices Misc Please dispense based on patient and insurance preference. Monitor FSBS 3x daily. Dx: E11.65   Lancets Misc Please dispense as Accu Chek Guide. USE TO TEST BLOOD SUGAR 3 TIMES DAILY. Dx: E11.65.    Linzess 290 MCG Caps capsule Generic drug: linaclotide TAKE 1 CAPSULE ONCE DAILY BEFORE BREAKFAST What changed: See the new instructions.   metFORMIN 500 MG tablet Commonly known as: GLUCOPHAGE Take 500 mg by mouth daily.   metoprolol succinate 50 MG 24 hr tablet Commonly known as: TOPROL-XL Take 50 mg by mouth daily.   ondansetron 4 MG tablet Commonly known as: Zofran Take 1 tablet (4 mg total) by mouth every 8 (eight) hours as needed for nausea or vomiting.   polyethylene glycol 17 g packet Commonly known as: MiraLax Take 17 g by mouth daily.        Follow-up Information     Johnette Abraham, MD. Schedule an appointment as soon as possible for a visit in 1 week(s).   Specialty: Internal Medicine Contact information: [address] [address] [place]. Go to.   Contact information: [place] [address] [phone]               Allergies  Allergen Reactions   Aspirin Other (See Comments)    G.I. Upset    Nalbuphine Itching, Swelling and Rash    Nubaine     Consultations: General surgery   Procedures/Studies: CT ABDOMEN PELVIS W CONTRAST  Result Date: 05/16/2022 CLINICAL DATA:  Acute abdominal pain. EXAM: CT ABDOMEN AND PELVIS WITH CONTRAST TECHNIQUE: Multidetector CT imaging of the abdomen and pelvis was performed using the standard protocol following bolus administration of intravenous contrast. RADIATION DOSE REDUCTION: This exam was performed according to the departmental dose-optimization program which includes automated exposure control, adjustment of the mA and/or kV according to patient size and/or use of iterative reconstruction technique. CONTRAST:  147m OMNIPAQUE IOHEXOL 300 MG/ML  SOLN COMPARISON:  CT abdomen and pelvis 05/12/2022 FINDINGS: Lower chest: No acute abnormality. Hepatobiliary: No focal liver abnormality is seen. Status post  cholecystectomy. No biliary dilatation. Pancreas: Unremarkable. No pancreatic ductal dilatation or surrounding inflammatory changes. Spleen: Normal in size without focal abnormality. Adrenals/Urinary Tract: There are bilateral renal cysts measuring up to 2.2 cm. There is no hydronephrosis or perinephric fluid. The adrenal glands and bladder are within normal limits. Stomach/Bowel: There are small bowel loops in the right lower quadrant which are mildly dilated with diffuse wall thickening, surrounding inflammatory stranding and mesenteric edema. Questionable tiny amount of pneumatosis seen on axial image 69 and 70. There are adjacent small lymph nodes. Proximal small bowel is nondilated. Stomach is nondilated. There is a moderate-sized hiatal hernia, unchanged. The colon is nondilated. The appendix is surgically absent. There is colonic diverticulosis without evidence for acute diverticulitis. Vascular/Lymphatic: Aortic atherosclerosis. No enlarged abdominal or pelvic lymph nodes. There is no evidence for portal venous gas. Reproductive: Status post hysterectomy. No adnexal masses. Other: There is trace free fluid in the pelvis. There is no focal abdominal wall hernia. There is no free intraperitoneal air. Musculoskeletal: L4-5 posterior fusion present. Degenerative changes affect the spine. IMPRESSION: 1. Small-bowel loops in the right lower quadrant are mildly dilated with wall thickening,  surrounding inflammatory stranding and mesenteric edema. Questionable tiny amount of pneumatosis. Findings are concerning for small bowel ischemia or other nonspecific enteritis. No bowel obstruction, portal venous gas or free air. 2. Trace free fluid in the pelvis. 3. Colonic diverticulosis. 4. Stable moderate-sized hiatal hernia. Aortic Atherosclerosis (ICD10-I70.0). Electronically Signed   By: Ronney Asters M.D.   On: 05/16/2022 23:24   DG Abd Portable 1V-Small Bowel Obstruction Protocol-initial, 8 hr delay  Result Date:  05/14/2022 CLINICAL DATA:  Small-bowel obstruction EXAM: PORTABLE ABDOMEN - 1 VIEW COMPARISON:  05/13/2022, 05/12/2022 FINDINGS: Two supine frontal views of the abdomen and pelvis are obtained 8 hours after oral contrast administration. At the time of imaging, oral contrast has progressed throughout the small and large bowel, excluding high-grade obstruction. There is persistent distension of multiple loops of small bowel within the central abdomen, unchanged since recent exam. No masses or abnormal calcifications. Stable postsurgical changes of the lumbar spine. IMPRESSION: 1. Progression of oral contrast throughout the large and small bowel, excluding high-grade obstruction. 2. Persistent distended loops of small bowel within the central abdomen, unchanged since prior exam. Electronically Signed   By: Randa Ngo M.D.   On: 05/14/2022 18:50   DG Abd 1 View  Result Date: 05/13/2022 CLINICAL DATA:  73 year old female with history of small-bowel obstruction. EXAM: ABDOMEN - 1 VIEW COMPARISON:  12/18/2021. FINDINGS: Multiple dilated loops of small bowel are noted in the central abdomen measuring up to 5.7 cm in diameter. There is a paucity of colonic gas and stool noted. Iodinated contrast material is noted in the lumen of the urinary bladder. Orthopedic fixation hardware in the lower lumbar spine incidentally noted. No frank pneumoperitoneum confidently identified on these supine images. IMPRESSION: 1. Bowel-gas pattern remains compatible with small-bowel obstruction, as above. Electronically Signed   By: Vinnie Langton M.D.   On: 05/13/2022 06:22   CT Abdomen Pelvis W Contrast  Result Date: 05/12/2022 CLINICAL DATA:  Abdomen pain nausea EXAM: CT ABDOMEN AND PELVIS WITH CONTRAST TECHNIQUE: Multidetector CT imaging of the abdomen and pelvis was performed using the standard protocol following bolus administration of intravenous contrast. RADIATION DOSE REDUCTION: This exam was performed according to the  departmental dose-optimization program which includes automated exposure control, adjustment of the mA and/or kV according to patient size and/or use of iterative reconstruction technique. CONTRAST:  114m OMNIPAQUE IOHEXOL 300 MG/ML  SOLN COMPARISON:  CT 05/07/2022, 05/04/2022, 04/19/2022 FINDINGS: Lower chest: Lung bases demonstrate no acute airspace disease. Cardiomegaly. Moderate fluid-filled hiatal hernia. Hepatobiliary: Status post cholecystectomy. No focal hepatic abnormality or biliary dilatation Pancreas: Unremarkable. No pancreatic ductal dilatation or surrounding inflammatory changes. Spleen: Normal in size without focal abnormality. Adrenals/Urinary Tract: Left adrenal gland is normal. Stable 4 cm right adrenal myelolipoma, no imaging follow-up is recommended. Simple cyst upper pole right kidney, no imaging follow-up recommended. Subcentimeter hypodensities too small to further characterize. No hydronephrosis. The bladder is decompressed Stomach/Bowel: Moderate fluid distension of the stomach. Multiple dilated fluid-filled loops of proximal and mid small bowel measuring to 4.3 cm. Transition to decompressed distal small bowel in the right lower quadrant, findings felt consistent with bowel obstruction. Patient is post appendectomy. Left colon diverticula without acute wall thickening. Vascular/Lymphatic: Mild aortic atherosclerosis. No aneurysm. No suspicious lymph nodes. Reproductive: Status post hysterectomy. No adnexal masses. Other: Negative for pelvic effusion.  No free air. Musculoskeletal: Posterior spinal rods and fixating screws at L4-L5 with interbody device. Mild chronic superior endplate deformity at L2 and mild chronic wedging T11. IMPRESSION: 1. Interval  increase in fluid-filled distended small bowel with transition to decompressed distal small bowel in the right lower quadrant, consistent with small bowel obstruction. Terminal ileum and colon are decompressed. 2. Left colon diverticular  disease without acute wall thickening. Moderate fluid-filled hiatal hernia 3. Stable 4 cm right adrenal myelolipoma, no imaging follow-up is recommended. 4. Aortic atherosclerosis. Aortic Atherosclerosis (ICD10-I70.0). Electronically Signed   By: Donavan Foil M.D.   On: 05/12/2022 22:16   CT ABDOMEN PELVIS W CONTRAST  Result Date: 05/07/2022 CLINICAL DATA:  Intra-abdominal abscess. EXAM: CT ABDOMEN AND PELVIS WITH CONTRAST TECHNIQUE: Multidetector CT imaging of the abdomen and pelvis was performed using the standard protocol following bolus administration of intravenous contrast. RADIATION DOSE REDUCTION: This exam was performed according to the departmental dose-optimization program which includes automated exposure control, adjustment of the mA and/or kV according to patient size and/or use of iterative reconstruction technique. CONTRAST:  163m OMNIPAQUE IOHEXOL 300 MG/ML  SOLN COMPARISON:  May 04, 2022. FINDINGS: Lower chest: Visualized lung bases are unremarkable. Stable moderate size hiatal hernia. Hepatobiliary: No focal liver abnormality is seen. Status post cholecystectomy. No biliary dilatation. Pancreas: Unremarkable. No pancreatic ductal dilatation or surrounding inflammatory changes. Spleen: Normal in size without focal abnormality. Adrenals/Urinary Tract: Stable right adrenal myelolipoma. Bilateral renal cysts are noted for which no further follow-up is required. No hydronephrosis or renal obstruction is noted. Urinary bladder is decompressed. Stomach/Bowel: There is no evidence of bowel obstruction. Status post appendectomy. There is no evidence of bowel obstruction. Sigmoid diverticulosis is noted without definite inflammation. No definite fluid collection or abscess is noted in the right lower quadrant. There is noted mildly dilated small bowel in the right lower quadrant which may represent ileus. Vascular/Lymphatic: Aortic atherosclerosis. No enlarged abdominal or pelvic lymph nodes.  Reproductive: Status post hysterectomy. No adnexal masses. Other: No abdominal wall hernia or abnormality. No abdominopelvic ascites. Musculoskeletal: No acute or significant osseous findings. IMPRESSION: Status post appendectomy. No definite abscess or fluid collection is noted in the right lower quadrant. Mildly dilated small bowel loops are noted in the right lower quadrant which may represent postoperative ileus. Sigmoid diverticulosis is noted without inflammation. Stable right adrenal myelolipoma. Moderate size hiatal hernia is noted. Aortic Atherosclerosis (ICD10-I70.0). Electronically Signed   By: JMarijo ConceptionM.D.   On: 05/07/2022 11:59   CT ABDOMEN PELVIS WO CONTRAST  Result Date: 05/04/2022 CLINICAL DATA:  Status post appendectomy 2 weeks ago still having nausea, vomiting, and radiating pain in the abdomen EXAM: CT ABDOMEN AND PELVIS WITHOUT CONTRAST TECHNIQUE: Multidetector CT imaging of the abdomen and pelvis was performed following the standard protocol without IV contrast. RADIATION DOSE REDUCTION: This exam was performed according to the departmental dose-optimization program which includes automated exposure control, adjustment of the mA and/or kV according to patient size and/or use of iterative reconstruction technique. COMPARISON:  CT abdomen and pelvis 04/19/2022 FINDINGS: Lower chest: No acute abnormality. Hepatobiliary: No focal liver abnormality is seen. Status post cholecystectomy. No biliary dilatation. Pancreas: Fatty atrophy.  No acute abnormality. Spleen: Unremarkable. Adrenals/Urinary Tract: Unchanged right adrenal myelolipoma measuring 4.0 cm, no follow-up recommended. No urinary calculi or hydronephrosis. Unremarkable bladder. Low-density right renal cyst does not require additional follow-up. Stomach/Bowel: Moderate hiatal hernia. Normal caliber large and small bowel. Colonic diverticulosis without diverticulitis. Postoperative change of appendectomy since 04/09/2022. Small  fluid collection with adjacent stranding and containing a punctate calcification within the appendectomy bed. This measures approximately 3.5 x 2.3 x 1.5 cm (2/53 and 5/49). This is more organized  and similar in size since 04/19/2022. This is intimately associated with the adjacent ileum and fistulous connection is difficult to exclude. Adjacent mesenteric fat stranding and edema. Vascular/Lymphatic: Aortic atherosclerosis. Subcentimeter right ileocolic nodes are likely reactive. Reproductive: Hysterectomy. Other: No free intraperitoneal air. Musculoskeletal: L4-L5 posterior fusion. No acute osseous abnormality. IMPRESSION: Since CT 04/19/2022, interval postoperative change of appendectomy. Redemonstrated inflammatory mass/abscess abutting the terminal ileum and cecum measuring up to 3.5 cm and containing an appendicolith. This abuts the terminal ileum and fistulous connection is difficult to exclude. Moderate hiatal hernia. Electronically Signed   By: Placido Sou M.D.   On: 05/04/2022 19:27     Discharge Exam: Vitals:   05/19/22 1939 05/20/22 0531  BP: (!) 164/93 (!) 149/84  Pulse: 72 70  Resp: 17 17  Temp: 98.2 F (36.8 C) 98.2 F (36.8 C)  SpO2: 93% 95%   Vitals:   05/18/22 1958 05/19/22 0554 05/19/22 1939 05/20/22 0531  BP: (!) 155/85 (!) 155/81 (!) 164/93 (!) 149/84  Pulse: 79 70 72 70  Resp: 18 17 17 17  $ Temp: 98.4 F (36.9 C) 98.2 F (36.8 C) 98.2 F (36.8 C) 98.2 F (36.8 C)  TempSrc: Oral Oral Oral Oral  SpO2: 94% 92% 93% 95%  Weight:  109.2 kg  108.6 kg  Height:        General: Pt is alert, awake, not in acute distress, obese Cardiovascular: RRR, S1/S2 +, no rubs, no gallops Respiratory: CTA bilaterally, no wheezing, no rhonchi Abdominal: Soft, NT, ND, bowel sounds + Extremities: no edema, no cyanosis    The results of significant diagnostics from this hospitalization (including imaging, microbiology, ancillary and laboratory) are listed below for reference.      Microbiology: Recent Results (from the past 240 hour(s))  Gastrointestinal Panel by PCR , Stool     Status: None   Collection Time: 05/12/22  8:55 PM   Specimen: Stool  Result Value Ref Range Status   Campylobacter species NOT DETECTED NOT DETECTED Final   Plesimonas shigelloides NOT DETECTED NOT DETECTED Final   Salmonella species NOT DETECTED NOT DETECTED Final   Yersinia enterocolitica NOT DETECTED NOT DETECTED Final   Vibrio species NOT DETECTED NOT DETECTED Final   Vibrio cholerae NOT DETECTED NOT DETECTED Final   Enteroaggregative E coli (EAEC) NOT DETECTED NOT DETECTED Final   Enteropathogenic E coli (EPEC) NOT DETECTED NOT DETECTED Final   Enterotoxigenic E coli (ETEC) NOT DETECTED NOT DETECTED Final   Shiga like toxin producing E coli (STEC) NOT DETECTED NOT DETECTED Final   Shigella/Enteroinvasive E coli (EIEC) NOT DETECTED NOT DETECTED Final   Cryptosporidium NOT DETECTED NOT DETECTED Final   Cyclospora cayetanensis NOT DETECTED NOT DETECTED Final   Entamoeba histolytica NOT DETECTED NOT DETECTED Final   Giardia lamblia NOT DETECTED NOT DETECTED Final   Adenovirus F40/41 NOT DETECTED NOT DETECTED Final   Astrovirus NOT DETECTED NOT DETECTED Final   Norovirus GI/GII NOT DETECTED NOT DETECTED Final   Rotavirus A NOT DETECTED NOT DETECTED Final   Sapovirus (I, II, IV, and V) NOT DETECTED NOT DETECTED Final    Comment: Performed at [facility], [address] [place], [place] [address]  C Difficile Quick Screen w PCR reflex     Status: Abnormal   Collection Time: 05/12/22  8:55 PM   Specimen: Stool  Result Value Ref Range Status   C Diff antigen POSITIVE (A) NEGATIVE Final   C Diff toxin NEGATIVE NEGATIVE Final   C Diff interpretation Results  are indeterminate. See PCR results.  Final    Comment: RESULT CALLED TO, READ BACK BY AND VERIFIED WITH: R.THOMPSON AT 2205 ON 02.06.24 BY ADGER J  Performed at [facility], [address], [place], [place]  [address]   C. Diff by PCR, Reflexed     Status: None   Collection Time: 05/12/22  8:55 PM  Result Value Ref Range Status   Toxigenic C. Difficile by PCR NEGATIVE NEGATIVE Final    Comment: Patient is colonized with non toxigenic C. difficile. May not need treatment unless significant symptoms are present. Performed at [facility], [place] [address], [place], [place] [address]      Labs: BNP (last 3 results) No results for input(s): "BNP" in the last 8760 hours. Basic Metabolic Panel: Recent Labs  Lab 05/15/22 0502 05/16/22 2211 05/17/22 0032 05/17/22 0615 05/18/22 0708 05/19/22 0446  NA 139 138  --  137 135 136  K 3.6 3.2*  --  3.7 3.2* 3.3*  CL 107 102  --  106 102 102  CO2 25 25  --  23 24 25  $ GLUCOSE 101* 139*  --  112* 116* 107*  BUN 7* 5*  --  <5* <5* 6*  CREATININE 0.75 0.84  --  0.69 0.76 0.87  CALCIUM 8.3* 9.1  --  8.8* 8.4* 8.9  MG 1.7  --  1.8 1.7 1.7 1.7  PHOS 2.5  --   --   --   --   --    Liver Function Tests: Recent Labs  Lab 05/17/22 0615 05/18/22 0708  AST 18 16  ALT 9 9  ALKPHOS 54 51  BILITOT 0.2* 0.6  PROT 6.7 6.2*  ALBUMIN 2.9* 2.7*   No results for input(s): "LIPASE", "AMYLASE" in the last 168 hours. No results for input(s): "AMMONIA" in the last 168 hours. CBC: Recent Labs  Lab 05/14/22 0532 05/15/22 0502 05/16/22 2211 05/17/22 0615 05/18/22 0708  WBC 5.0 5.2 5.1 5.0 5.5  NEUTROABS  --   --  3.7 3.0  --   HGB 8.3* 8.5* 9.8* 9.3* 9.2*  HCT 29.6* 30.9* 34.3* 33.1* 32.7*  MCV 86.3 86.6 84.3 84.2 84.9  PLT 300 294 388 385 357   Cardiac Enzymes: No results for input(s): "CKTOTAL", "CKMB", "CKMBINDEX", "TROPONINI" in the last 168 hours. BNP: Invalid input(s): "POCBNP" CBG: Recent Labs  Lab 05/19/22 1110 05/19/22 1614 05/19/22 1757 05/19/22 2357 05/20/22 0533  GLUCAP 141* 120* 114* 109* 98   D-Dimer No results for input(s): "DDIMER" in the last 72 hours. Hgb A1c No results for input(s): "HGBA1C" in the last 72  hours. Lipid Profile No results for input(s): "CHOL", "HDL", "LDLCALC", "TRIG", "CHOLHDL", "LDLDIRECT" in the last 72 hours. Thyroid function studies No results for input(s): "TSH", "T4TOTAL", "T3FREE", "THYROIDAB" in the last 72 hours.  Invalid input(s): "FREET3" Anemia work up No results for input(s): "VITAMINB12", "FOLATE", "FERRITIN", "TIBC", "IRON", "RETICCTPCT" in the last 72 hours. Urinalysis    Component Value Date/Time   COLORURINE YELLOW 05/17/2022 1415   APPEARANCEUR CLEAR 05/17/2022 1415   LABSPEC 1.030 05/17/2022 1415   PHURINE 6.0 05/17/2022 1415   GLUCOSEU NEGATIVE 05/17/2022 1415   HGBUR NEGATIVE 05/17/2022 1415   BILIRUBINUR NEGATIVE 05/17/2022 1415   KETONESUR NEGATIVE 05/17/2022 1415   PROTEINUR NEGATIVE 05/17/2022 1415   UROBILINOGEN 0.2 12/30/2012 1300   NITRITE NEGATIVE 05/17/2022 1415   LEUKOCYTESUR NEGATIVE 05/17/2022 1415   Sepsis Labs Recent Labs  Lab 05/15/22 0502 05/16/22 2211 05/17/22 0615 05/18/22  0708  WBC 5.2 5.1 5.0 5.5   Microbiology Recent Results (from the past 240 hour(s))  Gastrointestinal Panel by PCR , Stool     Status: None   Collection Time: 05/12/22  8:55 PM   Specimen: Stool  Result Value Ref Range Status   Campylobacter species NOT DETECTED NOT DETECTED Final   Plesimonas shigelloides NOT DETECTED NOT DETECTED Final   Salmonella species NOT DETECTED NOT DETECTED Final   Yersinia enterocolitica NOT DETECTED NOT DETECTED Final   Vibrio species NOT DETECTED NOT DETECTED Final   Vibrio cholerae NOT DETECTED NOT DETECTED Final   Enteroaggregative E coli (EAEC) NOT DETECTED NOT DETECTED Final   Enteropathogenic E coli (EPEC) NOT DETECTED NOT DETECTED Final   Enterotoxigenic E coli (ETEC) NOT DETECTED NOT DETECTED Final   Shiga like toxin producing E coli (STEC) NOT DETECTED NOT DETECTED Final   Shigella/Enteroinvasive E coli (EIEC) NOT DETECTED NOT DETECTED Final   Cryptosporidium NOT DETECTED NOT DETECTED Final   Cyclospora  cayetanensis NOT DETECTED NOT DETECTED Final   Entamoeba histolytica NOT DETECTED NOT DETECTED Final   Giardia lamblia NOT DETECTED NOT DETECTED Final   Adenovirus F40/41 NOT DETECTED NOT DETECTED Final   Astrovirus NOT DETECTED NOT DETECTED Final   Norovirus GI/GII NOT DETECTED NOT DETECTED Final   Rotavirus A NOT DETECTED NOT DETECTED Final   Sapovirus (I, II, IV, and V) NOT DETECTED NOT DETECTED Final    Comment: Performed at [facility], [address] [place], [place] [address]  C Difficile Quick Screen w PCR reflex     Status: Abnormal   Collection Time: 05/12/22  8:55 PM   Specimen: Stool  Result Value Ref Range Status   C Diff antigen POSITIVE (A) NEGATIVE Final   C Diff toxin NEGATIVE NEGATIVE Final   C Diff interpretation Results are indeterminate. See PCR results.  Final    Comment: RESULT CALLED TO, READ BACK BY AND VERIFIED WITH: R.THOMPSON AT 2205 ON 02.06.24 BY ADGER J  Performed at [facility], [address] [place], [place] [address]   C. Diff by PCR, Reflexed     Status: None   Collection Time: 05/12/22  8:55 PM  Result Value Ref Range Status   Toxigenic C. Difficile by PCR NEGATIVE NEGATIVE Final    Comment: Patient is colonized with non toxigenic C. difficile. May not need treatment unless significant symptoms are present. Performed at [facility], [place] [address] [place], [place] [address]      Time coordinating discharge: 35 minutes  SIGNED:   Rodena Goldmann, DO [facility] 05/20/2022, 10:44 AM  If 7PM-7AM, please contact night-coverage [email or web address]

## 2022-05-20 NOTE — TOC Transition Note (Signed)
Transition of Care [facility]) - CM/SW Discharge Note   Patient Details  Name: Yesenia [name] MRN: [id number] Date of Birth: Apr 29, 1950  [facility]) CM/SW Contact:  Shade Flood, LCSW Phone Number: 05/20/2022, 10:59 AM   Clinical Narrative:     Pt stable for dc home with [facility] today per MD. Updated Tommi Rumps at [place] of pt's dc.  No other TOC needs for dc.  Final next level of care: Home w Home Health Services Barriers to Discharge: Barriers Resolved   Patient Goals and CMS Choice CMS Medicare.gov Compare Post Acute Care list provided to:: Patient Choice offered to / list presented to : Patient  Discharge Placement                         Discharge Plan and Services Additional resources added to the After Visit Summary for   In-house Referral: Clinical Social Work Discharge Planning Services: CM Consult                      HH Arranged: PT, Nurse's Aide, RN [place] Agency: [place] Date [place]: 05/20/22   Representative spoke with at [place]: [place] Determinants of Health ([place]) Interventions SDOH Screenings   Food Insecurity: No Food Insecurity (05/17/2022)  Housing: Low Risk  (05/17/2022)  Transportation Needs: No Transportation Needs (05/17/2022)  Utilities: Not At Risk (05/17/2022)  Alcohol Screen: Low Risk  (05/22/2020)  Depression (PHQ2-9): Medium Risk (05/22/2020)  Financial Resource Strain: Low Risk  (02/21/2020)  Tobacco Use: Low Risk  (05/17/2022)     Readmission Risk Interventions    05/13/2022   12:12 PM 05/06/2022    9:01 AM 04/23/2022    2:39 PM  Readmission Risk Prevention Plan  Post Dischage Appt   Not Complete  Medication Screening Complete  Complete  Transportation Screening Complete Complete Complete  PCP or Specialist Appt within 5-7 Days  Complete   Home Care Screening  Complete   Medication Review (RN CM)  Complete

## 2022-05-20 NOTE — Progress Notes (Addendum)
2155: Pt c/o of nausea and 9/10 abdominal pain. Explained to pt that next PRN could be given at 2300, MD made aware and 1x dose of pain & nausea med was ordered and administered. Pt rested well until 0530. She then called for pain & nausea medication.   0600:Pt had large BM.

## 2022-05-20 NOTE — Progress Notes (Signed)
Discharge instructions reviewed with patient, patient verbalized understanding of instructions, discharged home with son in stable condition.

## 2022-05-21 ENCOUNTER — Ambulatory Visit: Payer: Medicare HMO | Admitting: Family Medicine
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
## 2022-06-09 ENCOUNTER — Ambulatory Visit: Payer: Medicare HMO | Admitting: Gastroenterology

## 2022-06-09 ENCOUNTER — Encounter: Payer: Self-pay | Admitting: Family Medicine

## 2022-06-09 ENCOUNTER — Ambulatory Visit (INDEPENDENT_AMBULATORY_CARE_PROVIDER_SITE_OTHER): Payer: Medicare HMO | Admitting: Family Medicine

## 2022-06-09 VITALS — BP 145/98 | HR 93 | Ht 62.0 in | Wt 255.0 lb

## 2022-06-09 DIAGNOSIS — R7301 Impaired fasting glucose: Secondary | ICD-10-CM

## 2022-06-09 DIAGNOSIS — I10 Essential (primary) hypertension: Secondary | ICD-10-CM | POA: Diagnosis not present

## 2022-06-09 DIAGNOSIS — Z1231 Encounter for screening mammogram for malignant neoplasm of breast: Secondary | ICD-10-CM

## 2022-06-09 DIAGNOSIS — F5101 Primary insomnia: Secondary | ICD-10-CM | POA: Diagnosis not present

## 2022-06-09 DIAGNOSIS — E785 Hyperlipidemia, unspecified: Secondary | ICD-10-CM | POA: Diagnosis not present

## 2022-06-09 DIAGNOSIS — E119 Type 2 diabetes mellitus without complications: Secondary | ICD-10-CM

## 2022-06-09 DIAGNOSIS — E039 Hypothyroidism, unspecified: Secondary | ICD-10-CM | POA: Diagnosis not present

## 2022-06-09 MED ORDER — LANCET DEVICE MISC: 1.0000 | Freq: Three times a day (TID) | 0 refills | Status: AC

## 2022-06-09 MED ORDER — TRAZODONE HCL 50 MG PO TABS: 25.0000 mg | ORAL_TABLET | Freq: Every evening | ORAL | 3 refills | Status: DC | PRN

## 2022-06-09 MED ORDER — METFORMIN HCL 500 MG PO TABS: 500.0000 mg | ORAL_TABLET | Freq: Every day | ORAL | 3 refills | Status: DC

## 2022-06-09 MED ORDER — BLOOD GLUCOSE MONITORING SUPPL DEVI: 1.0000 | Freq: Three times a day (TID) | 0 refills | Status: AC

## 2022-06-09 MED ORDER — BLOOD GLUCOSE TEST VI STRP: 1.0000 | ORAL_STRIP | Freq: Three times a day (TID) | 0 refills | Status: AC

## 2022-06-09 MED ORDER — LANCETS MISC. MISC: 1.0000 | Freq: Three times a day (TID) | 0 refills | Status: AC

## 2022-06-09 MED ORDER — TELMISARTAN-HCTZ 40-12.5 MG PO TABS: 1.0000 | ORAL_TABLET | Freq: Every day | ORAL | 1 refills | Status: DC

## 2022-06-09 MED ORDER — LEVOTHYROXINE SODIUM 75 MCG PO TABS: 75.0000 ug | ORAL_TABLET | Freq: Every day | ORAL | 3 refills | Status: DC

## 2022-06-09 MED ORDER — METOPROLOL SUCCINATE ER 50 MG PO TB24: 50.0000 mg | ORAL_TABLET | Freq: Every day | ORAL | 1 refills | Status: DC

## 2022-06-09 NOTE — Patient Instructions (Signed)
It was pleasure meeting with you today. Please take medications as prescribed. Follow up with your primary health provider if any health concerns arises.

## 2022-06-09 NOTE — Assessment & Plan Note (Addendum)
Vitals:   06/09/22 1005 06/09/22 1015  BP: (!) 159/91 (!) 145/98    Patient reported only taking Metoprolol 50 mg daily Prescribed Telmisartan- HCTZ 40-12 mg in todays visit Blood pressure not controlled.         Explained non pharmacological interventions such as low salt, DASH diet discussed. Educated on stress reduction and physical activity. Discussed signs and symptoms of major cardiovascular event and need to present to the ED. Follow up in 1 month or sooner if needed with blood pressure logs to evaluate B/P trends. Patient verbalizes understanding regarding plan of care and all questions answered.

## 2022-06-09 NOTE — Assessment & Plan Note (Addendum)
Prescribed Trazodone 25 mg as initial therapy  Explained to go to bed at the same time each night and get up at the same time each morning, including on the weekends. Make sure your bedroom is quiet, dark, relaxing, and at a comfortable temperature. Remove electronic devices, such as TVs, computers, and smart phones, from the bedroom.  Follow up in 4 weeks
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
## 2022-06-11 LAB — LIPID PANEL
Chol/HDL Ratio: 3.1 ratio (ref 0.0–4.4)
Cholesterol, Total: 181 mg/dL (ref 100–199)
HDL: 59 mg/dL (ref >39)
LDL Chol Calc (NIH): 99 mg/dL (ref 0–99)
Triglycerides: 128 mg/dL (ref 0–149)
VLDL Cholesterol Cal: 23 mg/dL (ref 5–40)

## 2022-06-11 LAB — CMP14+EGFR
ALT: 5 IU/L (ref 0–32)
AST: 14 IU/L (ref 0–40)
Albumin/Globulin Ratio: 1.4 (ref 1.2–2.2)
Albumin: 3.8 g/dL (ref 3.8–4.8)
Alkaline Phosphatase: 64 IU/L (ref 44–121)
BUN/Creatinine Ratio: 12 (ref 12–28)
BUN: 10 mg/dL (ref 8–27)
Bilirubin Total: 0.2 mg/dL (ref 0.0–1.2)
CO2: 22 mmol/L (ref 20–29)
Calcium: 10 mg/dL (ref 8.7–10.3)
Chloride: 103 mmol/L (ref 96–106)
Creatinine, Ser: 0.82 mg/dL (ref 0.57–1.00)
Globulin, Total: 2.7 g/dL (ref 1.5–4.5)
Glucose: 122 mg/dL — ABNORMAL HIGH (ref 70–99)
Potassium: 3.5 mmol/L (ref 3.5–5.2)
Sodium: 141 mmol/L (ref 134–144)
Total Protein: 6.5 g/dL (ref 6.0–8.5)
eGFR: 76 mL/min/{1.73_m2} (ref >59)

## 2022-06-11 LAB — CBC WITH DIFFERENTIAL/PLATELET
Basophils Absolute: 0.1 10*3/uL (ref 0.0–0.2)
Basos: 1 %
EOS (ABSOLUTE): 0.4 10*3/uL (ref 0.0–0.4)
Eos: 7 %
Hematocrit: 30.9 % — ABNORMAL LOW (ref 34.0–46.6)
Hemoglobin: 9.3 g/dL — ABNORMAL LOW (ref 11.1–15.9)
Immature Grans (Abs): 0 10*3/uL (ref 0.0–0.1)
Immature Granulocytes: 0 %
Lymphocytes Absolute: 2 10*3/uL (ref 0.7–3.1)
Lymphs: 29 %
MCH: 23.6 pg — ABNORMAL LOW (ref 26.6–33.0)
MCHC: 30.1 g/dL — ABNORMAL LOW (ref 31.5–35.7)
MCV: 78 fL — ABNORMAL LOW (ref 79–97)
Monocytes Absolute: 0.4 10*3/uL (ref 0.1–0.9)
Monocytes: 6 %
Neutrophils Absolute: 3.9 10*3/uL (ref 1.4–7.0)
Neutrophils: 57 %
Platelets: 269 10*3/uL (ref 150–450)
RBC: 3.94 x10E6/uL (ref 3.77–5.28)
RDW: 15.8 % — ABNORMAL HIGH (ref 11.7–15.4)
WBC: 6.8 10*3/uL (ref 3.4–10.8)

## 2022-06-11 LAB — MICROALBUMIN / CREATININE URINE RATIO
Creatinine, Urine: 141.9 mg/dL
Microalb/Creat Ratio: 22 mg/g creat (ref 0–29)
Microalbumin, Urine: 30.7 ug/mL

## 2022-06-11 LAB — HEMOGLOBIN A1C
Est. average glucose Bld gHb Est-mCnc: 137 mg/dL
Hgb A1c MFr Bld: 6.4 % — ABNORMAL HIGH (ref 4.8–5.6)

## 2022-06-11 LAB — TSH+FREE T4
Free T4: 0.49 ng/dL — ABNORMAL LOW (ref 0.82–1.77)
TSH: 52.1 u[IU]/mL — ABNORMAL HIGH (ref 0.450–4.500)

## 2022-06-16 ENCOUNTER — Other Ambulatory Visit: Payer: Self-pay | Admitting: Family Medicine

## 2022-06-16 DIAGNOSIS — E611 Iron deficiency: Secondary | ICD-10-CM

## 2022-06-16 MED ORDER — METFORMIN HCL 500 MG PO TABS: 500.0000 mg | ORAL_TABLET | Freq: Two times a day (BID) | ORAL | 3 refills | Status: DC

## 2022-06-16 MED ORDER — LEVOTHYROXINE SODIUM 100 MCG PO TABS: 100.0000 ug | ORAL_TABLET | Freq: Every day | ORAL | 3 refills | Status: DC

## 2022-06-17 ENCOUNTER — Encounter: Payer: Self-pay | Admitting: Internal Medicine

## 2022-06-17 ENCOUNTER — Ambulatory Visit (INDEPENDENT_AMBULATORY_CARE_PROVIDER_SITE_OTHER): Payer: Medicare HMO | Admitting: Internal Medicine

## 2022-06-17 DIAGNOSIS — Z0000 Encounter for general adult medical examination without abnormal findings: Secondary | ICD-10-CM
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
## 2022-06-17 NOTE — Patient Instructions (Addendum)
  [name] , Thank you for taking time to come for your Medicare Wellness Visit. I appreciate your ongoing commitment to your health goals. Please review the following plan we discussed and let me know if I can assist you in the future.   These are the goals we discussed: Patient is going to focus on weight loss. She is going to avoid soft drinks and walk daily.   Follow appointment to discuss BP and swelling in your legs Discuss treatment with osteoporosis with PCP 3.  Obtain information packet so you can update your Advance care directive   This is a list of the screening recommended for you and due dates:  Health Maintenance  Topic Date Due   Medicare Annual Wellness Visit  01/02/2016   Eye exam for diabetics  09/16/2019   Mammogram  02/08/2021   COVID-19 Vaccine (3 - Moderna risk series) 06/25/2022*   Hemoglobin A1C  12/10/2022   Yearly kidney function blood test for diabetes  06/09/2023   Yearly kidney health urinalysis for diabetes  06/09/2023   Complete foot exam   06/09/2023   DTaP/Tdap/Td vaccine (4 - Td or Tdap) 12/09/2023   Colon Cancer Screening  07/29/2025   Pneumonia Vaccine  Completed   Flu Shot  Completed   DEXA scan (bone density measurement)  Completed   Hepatitis C Screening: USPSTF Recommendation to screen - Ages 56-79 yo.  Completed   Zoster (Shingles) Vaccine  Completed   HPV Vaccine  Aged Out  *Topic was postponed. The date shown is not the original due date.

## 2022-06-23 ENCOUNTER — Telehealth: Payer: Self-pay

## 2022-06-23 NOTE — Telephone Encounter (Signed)
Received PA request for Dexlansoprazole DR 60 mg Cap. Unable to submit due to patient not trying and failing omeprazole, esomeprazole or pantoprazole. Pt also has not been seen in the office since 2022. Tried to contact pt was unable to reach lmom for pt to return my call.
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
## 2022-06-29 ENCOUNTER — Encounter: Payer: Medicare HMO | Admitting: Gastroenterology

## 2022-07-07 ENCOUNTER — Ambulatory Visit: Payer: Medicare HMO | Admitting: Family Medicine

## 2022-07-13 ENCOUNTER — Ambulatory Visit (INDEPENDENT_AMBULATORY_CARE_PROVIDER_SITE_OTHER): Payer: Medicare HMO | Admitting: Family Medicine

## 2022-07-13 ENCOUNTER — Encounter: Payer: Self-pay | Admitting: Family Medicine

## 2022-07-13 VITALS — BP 128/76 | HR 92 | Ht 62.0 in | Wt 249.0 lb

## 2022-07-13 DIAGNOSIS — Z1329 Encounter for screening for other suspected endocrine disorder: Secondary | ICD-10-CM | POA: Diagnosis not present

## 2022-07-13 DIAGNOSIS — D509 Iron deficiency anemia, unspecified: Secondary | ICD-10-CM

## 2022-07-13 DIAGNOSIS — I10 Essential (primary) hypertension: Secondary | ICD-10-CM

## 2022-07-13 DIAGNOSIS — E611 Iron deficiency: Secondary | ICD-10-CM | POA: Diagnosis not present

## 2022-07-13 MED ORDER — TELMISARTAN-HCTZ 40-12.5 MG PO TABS: 1.0000 | ORAL_TABLET | Freq: Every day | ORAL | 1 refills | Status: DC

## 2022-07-13 NOTE — Assessment & Plan Note (Signed)
Vitals:   07/13/22 1016 07/13/22 1030 07/13/22 1046  BP: 139/82 129/78 128/76   B/p controlled in today's visit, however patient reported b/p sometimes runs high at home. Advise pt to follow up in 4 weeks with blood pressure reading to evaluate trends. DASH diet discussed  Continue Telmisartan-HCTZ 40-12.5 mg and metoprolol 50 mg
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
## 2022-07-13 NOTE — Patient Instructions (Signed)
It was pleasure meeting with you today. Please take medications as prescribed. Follow up with your primary health provider if any health concerns arises. If symptoms worsen please contact your primary care provider and/or visit the emergency department.  

## 2022-07-14 ENCOUNTER — Other Ambulatory Visit: Payer: Self-pay | Admitting: Family Medicine

## 2022-07-14 DIAGNOSIS — E039 Hypothyroidism, unspecified: Secondary | ICD-10-CM

## 2022-07-14 DIAGNOSIS — I10 Essential (primary) hypertension: Secondary | ICD-10-CM

## 2022-07-14 LAB — FOLATE: Folate: 6.8 ng/mL (ref >3.0)

## 2022-07-14 LAB — CBC WITH DIFFERENTIAL/PLATELET
Basophils Absolute: 0 10*3/uL (ref 0.0–0.2)
Basos: 1 %
EOS (ABSOLUTE): 0.3 10*3/uL (ref 0.0–0.4)
Eos: 5 %
Hematocrit: 32.2 % — ABNORMAL LOW (ref 34.0–46.6)
Hemoglobin: 9.5 g/dL — ABNORMAL LOW (ref 11.1–15.9)
Immature Grans (Abs): 0 10*3/uL (ref 0.0–0.1)
Immature Granulocytes: 0 %
Lymphocytes Absolute: 1.9 10*3/uL (ref 0.7–3.1)
Lymphs: 29 %
MCH: 23.1 pg — ABNORMAL LOW (ref 26.6–33.0)
MCHC: 29.5 g/dL — ABNORMAL LOW (ref 31.5–35.7)
MCV: 78 fL — ABNORMAL LOW (ref 79–97)
Monocytes Absolute: 0.6 10*3/uL (ref 0.1–0.9)
Monocytes: 9 %
Neutrophils Absolute: 3.6 10*3/uL (ref 1.4–7.0)
Neutrophils: 56 %
Platelets: 313 10*3/uL (ref 150–450)
RBC: 4.12 x10E6/uL (ref 3.77–5.28)
RDW: 16.2 % — ABNORMAL HIGH (ref 11.7–15.4)
WBC: 6.3 10*3/uL (ref 3.4–10.8)

## 2022-07-14 LAB — IRON,TIBC AND FERRITIN PANEL
Ferritin: 22 ng/mL (ref 15–150)
Iron Saturation: 10 % — ABNORMAL LOW (ref 15–55)
Iron: 36 ug/dL (ref 27–139)
Total Iron Binding Capacity: 378 ug/dL (ref 250–450)
UIBC: 342 ug/dL (ref 118–369)

## 2022-07-14 LAB — VITAMIN B12: Vitamin B-12: 105 pg/mL — ABNORMAL LOW (ref 232–1245)

## 2022-07-14 LAB — TSH+FREE T4
Free T4: 1.17 ng/dL (ref 0.82–1.77)
TSH: 6.5 u[IU]/mL — ABNORMAL HIGH (ref 0.450–4.500)

## 2022-07-14 MED ORDER — LEVOTHYROXINE SODIUM 125 MCG PO TABS: 125.0000 ug | ORAL_TABLET | Freq: Every day | ORAL | 3 refills | Status: DC

## 2022-07-14 NOTE — Progress Notes (Signed)
Please inform patient Vitamin B 12 levels are significantly low. I advise OTC vitamin B 12 1,000 mcg tablet once per day Encourage to consume vitamin B 12 rich foods such as lean meats, chicken, eggs, seafood, beans, peas, lentils Recheck labs in 2 months

## 2022-07-14 NOTE — Progress Notes (Signed)
Please inform patient, TSH Thyroid level elevated. I increased her synthroid medications to 125 mcg- meds sent to pharmacy.  Her labs indicate iron deficiency anemia. I advise to consume iron rich foods such as Red meat, pork, poultry, seafood, beans, dark green leafy vegetables, such as spinach, dried fruit, such as raisins and apricots. Iron-fortified cereals, and peas. I encourage to take over the counter iron tablets 65 mg once daily. In our next visit we'll draw recheck labs screen for iron deficiency anemia.

## 2022-07-15 ENCOUNTER — Ambulatory Visit (INDEPENDENT_AMBULATORY_CARE_PROVIDER_SITE_OTHER): Payer: Medicare HMO | Admitting: Gastroenterology

## 2022-07-15 ENCOUNTER — Encounter: Payer: Self-pay | Admitting: Gastroenterology

## 2022-07-15 VITALS — BP 131/81 | HR 80 | Temp 97.8°F | Ht 62.0 in | Wt 246.4 lb

## 2022-07-15 DIAGNOSIS — K219 Gastro-esophageal reflux disease without esophagitis: Secondary | ICD-10-CM | POA: Diagnosis not present

## 2022-07-15 DIAGNOSIS — D649 Anemia, unspecified: Secondary | ICD-10-CM | POA: Diagnosis not present

## 2022-07-15 DIAGNOSIS — K5900 Constipation, unspecified: Secondary | ICD-10-CM | POA: Diagnosis not present

## 2022-07-15 MED ORDER — PANTOPRAZOLE SODIUM 40 MG PO TBEC: 40.0000 mg | DELAYED_RELEASE_TABLET | Freq: Every day | ORAL | 3 refills | Status: DC

## 2022-07-15 NOTE — Progress Notes (Signed)
GI Office Note    Referring Provider: Wylene Men* Primary Care Physician:  Rica Records, FNP  Primary Gastroenterologist: Roetta Sessions, MD   Chief Complaint   Chief Complaint  Patient presents with   Follow-up    Doing well still having issues with reflux, was on dexilant but ins no longer covers it.    History of Present Illness   Yesenia [name] is a 72 y.o. female presenting today for follow-up of GERD.  She is also high risk for colon cancer due to positive family history of colon cancer in 2 first-degree relatives.  Currently up-to-date on colonoscopy, last completed April 2022 with recommendations for 5-year surveillance.  Patient also has chronic constipation.  Reflux well-controlled on Dexilant but unfortunately her insurance will no longer cover it unless she has tried and failed omeprazole, esomeprazole, or pantoprazole.  Patient also with history of chronic microcytic anemia.  In 2016 she received iron infusions, completed colonoscopy/EGD/capsule study (2016/2017).  Capsule study showed gastric erosions and small bowel erosions, nonbleeding.  She had gastritis and a tubular adenoma removed.  Hemoglobin was 11.1 back in January when she presented to the hospital with acute perforated appendicitis.  Hemoglobin 11.4 (normal 11.5-15) September 2023 , at time of discharge was 8.9.  Recent hemoglobin last week was 9.5.  07/13/2022: Ferritin low normal at 22, iron saturations 10%, iron 36, B12 105.  Hospitalized with acute perforated appendix beginning of January.  She had robotic assisted for scopic appendectomy on January 15.  JP drain placed at that time.  Postoperatively she developed an ileus, discharged home on postop day 6.  Ultimately had 3 readmissions in February, last 1 from February 10 through 14.  Numerous CT scans.  First readmission for phlegmon treated with IV antibiotics.  Concern for ileus versus small bowel obstruction on several  subsequent CTs.  Patient reports receiving NG tube at 1 point.  Required any additional surgical intervention.  C. difficile antigen + February 6, negative toxin.   Today: Patient states she is finally feeling more normal.  Her GI tract seems to be doing well at this point.  For the past 2 weeks she has been having regular bowel movements.  Uses MiraLAX or Linzess only when needed.  Denies any blood in the stool or melena.  Heartburn does well on Dexilant.  Currently has been out about a week.  Needs to switch to a different PPI.  Denies any nausea/vomiting.  No abdominal pain.  Occasional sensation of food sticking for couple of seconds but able to wash down without difficulty.  Starting oral iron and B12, recent labs documented deficiencies.    EGD April 2022: Normal esophagus s/p empiric dilation, medium sized hiatal hernia, 1 gastric polyp resected and retrieved, abnormal antral mucosa biopsied, normal-appearing duodenum. Pathology revealed hyperplastic gastric polyp, antral biopsy with reactive gastropathy, negative for H. pylori.   Colonoscopy April 2022: Diverticulosis in the sigmoid colon and in the descending colon.  Colonic lipomas.  Redundant colon.  Nonbleeding internal hemorrhoids.  Surveillance in 5 years.  Medications   Current Outpatient Medications  Medication Sig Dispense Refill   Blood Glucose Monitoring Suppl (BLOOD GLUCOSE SYSTEM PAK) KIT Please dispense as Accu Chek Guide. USE TO TEST BLOOD SUGAR 3 TIMES DAILY. Dx: E11.65. 1 kit 1   Blood Glucose Monitoring Suppl DEVI 1 each by Does not apply route in the morning, at noon, and at bedtime. May substitute to any manufacturer covered by patient's  insurance. 1 each 0   docusate sodium (COLACE) 100 MG capsule Take 1 capsule (100 mg total) by mouth 2 (two) times daily. 60 capsule 2   Glucose Blood (BLOOD GLUCOSE TEST STRIPS) STRP Please dispense as Accu Chek Guide. USE TO TEST BLOOD SUGAR 3 TIMES DAILY. Dx: E11.65 200 strip 3    glucose blood test strip by miscellaneous route.     Lancet Devices MISC Please dispense based on patient and insurance preference. Monitor FSBS 3x daily. Dx: E11.65 1 each 1   Lancets MISC Please dispense as Accu Chek Guide. USE TO TEST BLOOD SUGAR 3 TIMES DAILY. Dx: E11.65. 200 each 3   levothyroxine (SYNTHROID) 125 MCG tablet Take 1 tablet (125 mcg total) by mouth daily. 90 tablet 3   linaclotide (LINZESS) 290 MCG CAPS capsule TAKE 1 CAPSULE ONCE DAILY BEFORE BREAKFAST (Patient taking differently: Take 290 mcg by mouth daily before breakfast.) 90 capsule 3   metFORMIN (GLUCOPHAGE) 500 MG tablet Take 1 tablet (500 mg total) by mouth 2 (two) times daily with a meal. 180 tablet 3   metoprolol succinate (TOPROL-XL) 50 MG 24 hr tablet TAKE 1 TABLET ONCE DAILY. 30 tablet 0   polyethylene glycol (MIRALAX) 17 g packet Take 17 g by mouth daily. 14 each 0   telmisartan-hydrochlorothiazide (MICARDIS HCT) 40-12.5 MG tablet Take 1 tablet by mouth daily. 90 tablet 1   traZODone (DESYREL) 50 MG tablet Take 0.5 tablets (25 mg total) by mouth at bedtime as needed for sleep. 30 tablet 3   No current facility-administered medications for this visit.    Allergies   Allergies as of 07/15/2022 - Review Complete 07/15/2022  Allergen Reaction Noted   Aspirin Other (See Comments) 12/05/2009   Nalbuphine Itching, Swelling, and Rash      Review of Systems   General: Negative for anorexia, weight loss, fever, chills, fatigue, weakness. ENT: Negative for hoarseness, difficulty swallowing , nasal congestion. CV: Negative for chest pain, angina, palpitations, dyspnea on exertion, peripheral edema.  Respiratory: Negative for dyspnea at rest, dyspnea on exertion, cough, sputum, wheezing.  GI: See history of present illness. GU:  Negative for dysuria, hematuria, urinary incontinence, urinary frequency, nocturnal urination.  Endo: Negative for unusual weight change.     Physical Exam   BP 131/81 (BP Location:  Right Arm, Patient Position: Sitting, Cuff Size: Large)   Pulse 80   Temp 97.8 F (36.6 C) (Oral)   Ht 5\' 2"  (1.575 m)   Wt 246 lb 6.4 oz (111.8 kg)   BMI 45.07 kg/m    General: Well-nourished, well-developed in no acute distress.  Eyes: No icterus. Mouth: Oropharyngeal mucosa moist and pink   Lungs: Clear to auscultation bilaterally.  Heart: Regular rate and rhythm, no murmurs rubs or gallops.  Abdomen: Bowel sounds are normal, nontender, nondistended, no hepatosplenomegaly or masses,  no abdominal bruits or hernia , no rebound or guarding.  Rectal: not performed Extremities: No lower extremity edema. No clubbing or deformities. Neuro: Alert and oriented x 4   Skin: Warm and dry, no jaundice.   Psych: Alert and cooperative, normal mood and affect.  Labs   Lab Results  Component Value Date   FOLATE 6.8 07/13/2022   Lab Results  Component Value Date   VITAMINB12 105 (L) 07/13/2022   Lab Results  Component Value Date   IRON 36 07/13/2022   TIBC 378 07/13/2022   FERRITIN 22 07/13/2022   Lab Results  Component Value Date   WBC 6.3 07/13/2022  HGB 9.5 (L) 07/13/2022   HCT 32.2 (L) 07/13/2022   MCV 78 (L) 07/13/2022   PLT 313 07/13/2022   Lab Results  Component Value Date   TSH 6.500 (H) 07/13/2022   Lab Results  Component Value Date   ALT 5 06/09/2022   AST 14 06/09/2022   ALKPHOS 64 06/09/2022   BILITOT 0.2 06/09/2022   Lab Results  Component Value Date   CREATININE 0.82 06/09/2022   BUN 10 06/09/2022   NA 141 06/09/2022   K 3.5 06/09/2022   CL 103 06/09/2022   CO2 22 06/09/2022    Imaging Studies   No results found.  Assessment   GERD: doing well on dexilant but insurance requires her to try/fail omeprazole, esomeprazole, or pantoprazole.  Will try pantoprazole for now.  Anemia: Likely multifactorial.  Recent surgery/illness/multiple admissions likely contributing.  Recently found to have low B12, starting oral supplements.  Low normal  ferritin, will be starting oral iron as well.  Last EGD and colonoscopy in 2022.  For now plan to follow anemia, if she does not rebound to baseline, she may need to have further workup.  Constipation: Doing well with as needed Linzess or MiraLAX.    PLAN   Start pantoprazole 40 mg daily before breakfast.  She will call if her symptoms or not adequately controlled. Continue Linzess and/or MiraLAX as needed. Will follow anemia along with PCP, if she does not return to baseline, will need further workup. Return to the office in 3 months.  Leanna BattlesLeslie S. Melvyn NethLewis, MHS, PA-C [facility]

## 2022-07-15 NOTE — Patient Instructions (Signed)
Start pantoprazole 40 mg daily before breakfast.  Please call us at [phone] (Tammy, CMA) if pantoprazole does not control your heartburn symptoms. Continue Linzess as needed. We will monitor your anemia along with your PCP.  If additional workup needed we will let you know. Return office visit in 3 months.

## 2022-07-29 ENCOUNTER — Telehealth: Payer: Self-pay

## 2022-07-29 NOTE — Telephone Encounter (Signed)
Pt called stating that the pantoprazole has caused her to have severe diarrhea and nausea. Pt has stopped taking it. Pt states diarrhea has since eased up some but that she still has it. Please advise.

## 2022-07-30 ENCOUNTER — Other Ambulatory Visit: Payer: Self-pay

## 2022-07-30 DIAGNOSIS — R197 Diarrhea, unspecified: Secondary | ICD-10-CM

## 2022-07-30 MED ORDER — RABEPRAZOLE SODIUM 20 MG PO TBEC: 20.0000 mg | DELAYED_RELEASE_TABLET | Freq: Every day | ORAL | 5 refills | Status: DC

## 2022-07-30 NOTE — Telephone Encounter (Signed)
Pt was made aware and verbalized understanding. Lab was ordered and pt was instructed to have done.

## 2022-07-30 NOTE — Addendum Note (Signed)
Addended by: Tiffany Kocher on: 07/30/2022 01:48 PM   Modules accepted: Orders

## 2022-07-30 NOTE — Telephone Encounter (Signed)
Stop pantoprazole. Start aciphex  daily. Please have her check Cdiff [facility] toxin A/B at [facility]. You will have to use the new codes they provided.

## 2022-07-31 ENCOUNTER — Ambulatory Visit: Payer: Medicare HMO | Admitting: Surgical

## 2022-08-12 ENCOUNTER — Ambulatory Visit: Payer: Medicare HMO | Admitting: Surgical

## 2022-08-12 ENCOUNTER — Ambulatory Visit: Payer: Medicare HMO | Admitting: Family Medicine

## 2022-08-18 ENCOUNTER — Other Ambulatory Visit: Payer: Self-pay | Admitting: Family Medicine

## 2022-08-18 DIAGNOSIS — I10 Essential (primary) hypertension: Secondary | ICD-10-CM

## 2022-08-19 ENCOUNTER — Ambulatory Visit: Payer: Medicare HMO | Admitting: Family Medicine

## 2022-09-08 ENCOUNTER — Ambulatory Visit: Payer: Medicare HMO | Admitting: Family Medicine

## 2022-09-16 ENCOUNTER — Ambulatory Visit: Payer: Medicare HMO | Admitting: Family Medicine

## 2022-09-22 ENCOUNTER — Other Ambulatory Visit: Payer: Self-pay | Admitting: Gastroenterology

## 2022-09-30 ENCOUNTER — Other Ambulatory Visit: Payer: Self-pay

## 2022-09-30 ENCOUNTER — Encounter (HOSPITAL_COMMUNITY): Payer: Self-pay

## 2022-09-30 ENCOUNTER — Observation Stay (HOSPITAL_COMMUNITY)
Admission: EM | Admit: 2022-09-30 | Discharge: 2022-10-01 | Disposition: A | Discharge status: Home / Self Care | Payer: Medicare HMO | Attending: Internal Medicine | Admitting: Internal Medicine

## 2022-09-30 ENCOUNTER — Emergency Department (HOSPITAL_COMMUNITY): Payer: Medicare HMO

## 2022-09-30 DIAGNOSIS — E66813 Obesity, class 3: Secondary | ICD-10-CM | POA: Diagnosis present

## 2022-09-30 DIAGNOSIS — K219 Gastro-esophageal reflux disease without esophagitis: Secondary | ICD-10-CM | POA: Diagnosis not present

## 2022-09-30 DIAGNOSIS — E1165 Type 2 diabetes mellitus with hyperglycemia: Secondary | ICD-10-CM | POA: Diagnosis not present

## 2022-09-30 DIAGNOSIS — R072 Precordial pain: Secondary | ICD-10-CM | POA: Diagnosis not present

## 2022-09-30 DIAGNOSIS — Z86711 Personal history of pulmonary embolism: Secondary | ICD-10-CM | POA: Diagnosis not present

## 2022-09-30 DIAGNOSIS — Z79899 Other long term (current) drug therapy: Secondary | ICD-10-CM | POA: Insufficient documentation

## 2022-09-30 DIAGNOSIS — Z7984 Long term (current) use of oral hypoglycemic drugs: Secondary | ICD-10-CM | POA: Insufficient documentation

## 2022-09-30 DIAGNOSIS — I10 Essential (primary) hypertension: Secondary | ICD-10-CM | POA: Diagnosis not present

## 2022-09-30 DIAGNOSIS — R002 Palpitations: Secondary | ICD-10-CM | POA: Diagnosis not present

## 2022-09-30 DIAGNOSIS — D649 Anemia, unspecified: Secondary | ICD-10-CM | POA: Diagnosis not present

## 2022-09-30 DIAGNOSIS — R0789 Other chest pain: Secondary | ICD-10-CM | POA: Diagnosis present

## 2022-09-30 DIAGNOSIS — R079 Chest pain, unspecified: Secondary | ICD-10-CM | POA: Diagnosis not present

## 2022-09-30 DIAGNOSIS — E039 Hypothyroidism, unspecified: Secondary | ICD-10-CM | POA: Diagnosis present

## 2022-09-30 DIAGNOSIS — G4700 Insomnia, unspecified: Secondary | ICD-10-CM | POA: Diagnosis not present

## 2022-09-30 LAB — URINALYSIS, ROUTINE W REFLEX MICROSCOPIC
Bilirubin Urine: NEGATIVE
Glucose, UA: NEGATIVE mg/dL
Hgb urine dipstick: NEGATIVE
Ketones, ur: NEGATIVE mg/dL
Leukocytes,Ua: NEGATIVE
Nitrite: NEGATIVE
Protein, ur: NEGATIVE mg/dL
Specific Gravity, Urine: 1.027 (ref 1.005–1.030)
pH: 5 (ref 5.0–8.0)

## 2022-09-30 LAB — CBC
HCT: 34.9 % — ABNORMAL LOW (ref 36.0–46.0)
Hemoglobin: 10.1 g/dL — ABNORMAL LOW (ref 12.0–15.0)
MCH: 23.4 pg — ABNORMAL LOW (ref 26.0–34.0)
MCHC: 28.9 g/dL — ABNORMAL LOW (ref 30.0–36.0)
MCV: 81 fL (ref 80.0–100.0)
Platelets: 333 10*3/uL (ref 150–400)
RBC: 4.31 MIL/uL (ref 3.87–5.11)
RDW: 17.1 % — ABNORMAL HIGH (ref 11.5–15.5)
WBC: 7.1 10*3/uL (ref 4.0–10.5)
nRBC: 0 % (ref 0.0–0.2)

## 2022-09-30 LAB — BASIC METABOLIC PANEL
Anion gap: 9 (ref 5–15)
BUN: 14 mg/dL (ref 8–23)
CO2: 22 mmol/L (ref 22–32)
Calcium: 9.6 mg/dL (ref 8.9–10.3)
Chloride: 102 mmol/L (ref 98–111)
Creatinine, Ser: 0.95 mg/dL (ref 0.44–1.00)
GFR, Estimated: >60 mL/min (ref >60)
Glucose, Bld: 118 mg/dL — ABNORMAL HIGH (ref 70–99)
Potassium: 3.6 mmol/L (ref 3.5–5.1)
Sodium: 133 mmol/L — ABNORMAL LOW (ref 135–145)

## 2022-09-30 LAB — TROPONIN I (HIGH SENSITIVITY)
Troponin I (High Sensitivity): 5 ng/L (ref <18)
Troponin I (High Sensitivity): 5 ng/L (ref <18)
Troponin I (High Sensitivity): 5 ng/L (ref <18)

## 2022-09-30 LAB — GLUCOSE, CAPILLARY: Glucose-Capillary: 157 mg/dL — ABNORMAL HIGH (ref 70–99)

## 2022-09-30 MED ORDER — LIDOCAINE VISCOUS HCL 2 % MT SOLN
15.0000 mL | Freq: Once | OROMUCOSAL | Status: AC
  Administered 2022-09-30: 15 mL via OROMUCOSAL
  Filled 2022-09-30: qty 15

## 2022-09-30 MED ORDER — PANTOPRAZOLE SODIUM 40 MG PO TBEC
40.0000 mg | DELAYED_RELEASE_TABLET | Freq: Every day | ORAL | Status: DC
  Administered 2022-09-30 – 2022-10-01 (×2): 40 mg via ORAL
  Filled 2022-09-30 (×2): qty 1

## 2022-09-30 MED ORDER — ONDANSETRON HCL 4 MG PO TABS: 4.0000 mg | ORAL_TABLET | Freq: Four times a day (QID) | ORAL | Status: DC | PRN

## 2022-09-30 MED ORDER — NITROGLYCERIN 0.4 MG SL SUBL
0.4000 mg | SUBLINGUAL_TABLET | SUBLINGUAL | Status: DC | PRN
  Administered 2022-09-30: 0.4 mg via SUBLINGUAL
  Filled 2022-09-30: qty 1

## 2022-09-30 MED ORDER — ALUM & MAG HYDROXIDE-SIMETH 200-200-20 MG/5ML PO SUSP
30.0000 mL | Freq: Once | ORAL | Status: AC
  Administered 2022-09-30: 30 mL via ORAL
  Filled 2022-09-30: qty 30

## 2022-09-30 MED ORDER — LEVOTHYROXINE SODIUM 125 MCG PO TABS
125.0000 ug | ORAL_TABLET | Freq: Every day | ORAL | Status: DC
  Administered 2022-10-01: 125 ug via ORAL
  Filled 2022-09-30: qty 1

## 2022-09-30 MED ORDER — TELMISARTAN-HCTZ 40-12.5 MG PO TABS: 1.0000 | ORAL_TABLET | Freq: Every day | ORAL | Status: DC

## 2022-09-30 MED ORDER — HYDROCHLOROTHIAZIDE 12.5 MG PO TABS
12.5000 mg | ORAL_TABLET | Freq: Every day | ORAL | Status: DC
  Administered 2022-10-01: 12.5 mg via ORAL
  Filled 2022-09-30: qty 1

## 2022-09-30 MED ORDER — INSULIN ASPART 100 UNIT/ML IJ SOLN
0.0000 [IU] | INTRAMUSCULAR | Status: DC
  Administered 2022-09-30: 3 [IU] via SUBCUTANEOUS

## 2022-09-30 MED ORDER — ENOXAPARIN SODIUM 40 MG/0.4ML IJ SOSY
40.0000 mg | PREFILLED_SYRINGE | INTRAMUSCULAR | Status: DC
  Administered 2022-09-30: 40 mg via SUBCUTANEOUS
  Filled 2022-09-30: qty 0.4

## 2022-09-30 MED ORDER — IRBESARTAN 150 MG PO TABS
150.0000 mg | ORAL_TABLET | Freq: Every day | ORAL | Status: DC
  Administered 2022-10-01: 150 mg via ORAL
  Filled 2022-09-30: qty 1

## 2022-09-30 MED ORDER — ONDANSETRON HCL 4 MG/2ML IJ SOLN: 4.0000 mg | Freq: Four times a day (QID) | INTRAMUSCULAR | Status: DC | PRN

## 2022-09-30 MED ORDER — ACETAMINOPHEN 650 MG RE SUPP: 650.0000 mg | Freq: Four times a day (QID) | RECTAL | Status: DC | PRN

## 2022-09-30 MED ORDER — ACETAMINOPHEN 325 MG PO TABS: 650.0000 mg | ORAL_TABLET | Freq: Four times a day (QID) | ORAL | Status: DC | PRN

## 2022-09-30 NOTE — ED Triage Notes (Signed)
Pt called out EMS for Chest pain starting about 50 minutes ago left chest pain. Pt has hx of 3 heart caths 2 from the 90s one from 2018. Pt has nausea, no vomiting, had 324 of oxygen. Pt states SOB 94% RA, on 2L 97%. Pt states feeling throat "squeezing" per EMS no visual obstruction, pt denies difficulty of swallowing.
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
## 2022-09-30 NOTE — Progress Notes (Signed)
Patient arrived to floor via wheel chair. She is alert, oriented and ambulatory. Admission assessment completed as well as questions. Patient in bed; call bell within reach

## 2022-09-30 NOTE — ED Provider Notes (Signed)
[place] EMERGENCY DEPARTMENT AT [facility] Provider Note   CSN: [id number] Arrival date & time: 09/30/22  1335     History  Chief Complaint  Patient presents with   Chest Pain    Yesenia [name] is a 72 y.o. female.  HPI    72 year old female comes in with chief complaint of chest pain. Her medical history is significant for type 2 diabetes mellitus, send hypertension, GERD.  Patient indicates that she is driving chest pain about an hour ago.  Chest pain is located on the left side, radiating towards the left shoulder region.  Pain was unprovoked.  She denies any associated shortness of breath, but does have some nausea.  EMS gave her oxygen and aspirin.  She has no nitro at home.  No history of similar pain in the recent past, even with exertion.  Patient indicates that she has had catheterization in the past for similar pain, but they have not put any stents in her.  Last cath was in 2018.  Home Medications Prior to Admission medications   Medication Sig Start Date End Date Taking? Authorizing Provider  Blood Glucose Monitoring Suppl (BLOOD GLUCOSE SYSTEM PAK) KIT Please dispense as Accu Chek Guide. USE TO TEST BLOOD SUGAR 3 TIMES DAILY. Dx: E11.65. 01/23/20   Salley Scarlet, MD  Blood Glucose Monitoring Suppl DEVI 1 each by Does not apply route in the morning, at noon, and at bedtime. May substitute to any manufacturer covered by patient's insurance. 06/09/22   Del Nigel Berthold, FNP  docusate sodium (COLACE) 100 MG capsule Take 1 capsule (100 mg total) by mouth 2 (two) times daily. 04/26/22 04/26/23  Pappayliou, Santina Evans A, DO  Glucose Blood (BLOOD GLUCOSE TEST STRIPS) STRP Please dispense as Accu Chek Guide. USE TO TEST BLOOD SUGAR 3 TIMES DAILY. Dx: E11.65 01/23/20   Salley Scarlet, MD  glucose blood test strip by miscellaneous route. 03/12/15   [provider]  Lancet Devices MISC Please dispense based on patient and insurance preference.  Monitor FSBS 3x daily. Dx: E11.65 09/13/17   Salley Scarlet, MD  Lancets MISC Please dispense as Accu Chek Guide. USE TO TEST BLOOD SUGAR 3 TIMES DAILY. Dx: E11.65. 01/23/20   Salley Scarlet, MD  levothyroxine (SYNTHROID) 125 MCG tablet Take 1 tablet (125 mcg total) by mouth daily. 07/14/22   Del Nigel Berthold, FNP  linaclotide (LINZESS) 290 MCG CAPS capsule TAKE 1 CAPSULE ONCE DAILY BEFORE BREAKFAST Patient taking differently: Take 290 mcg by mouth daily before breakfast. 02/18/22   Gelene Mink, NP  metFORMIN (GLUCOPHAGE) 500 MG tablet Take 1 tablet (500 mg total) by mouth 2 (two) times daily with a meal. 06/16/22   Del Newman Nip, Tenna Child, FNP  metoprolol succinate (TOPROL-XL) 50 MG 24 hr tablet take 1 tablet once daily. 08/18/22   Del Nigel Berthold, FNP  polyethylene glycol (MIRALAX) 17 g packet Take 17 g by mouth daily. 05/20/22   Sherryll Burger, Pratik D, DO  RABEprazole (ACIPHEX) 20 MG tablet Take 1 tablet (20 mg total) by mouth daily before breakfast. 07/30/22   Tiffany Kocher, PA-C  telmisartan-hydrochlorothiazide (MICARDIS HCT) 40-12.5 MG tablet Take 1 tablet by mouth daily. 07/13/22   Del Nigel Berthold, FNP  traZODone (DESYREL) 50 MG tablet Take 0.5 tablets (25 mg total) by mouth at bedtime as needed for sleep. 06/09/22   Del Nigel Berthold, FNP      Allergies    Aspirin and Nalbuphine  Review of Systems   Review of Systems  All other systems reviewed and are negative.   Physical Exam Updated Vital Signs BP 133/77 (BP Location: Right Wrist)   Pulse 86   Temp 98.8 F (37.1 C) (Oral)   Resp 18   Ht 5\' 2"  (1.575 m)   Wt 112 kg   SpO2 96%   BMI 45.18 kg/m  Physical Exam Vitals and nursing note reviewed.  Constitutional:      Appearance: She is well-developed.  HENT:     Head: Normocephalic and atraumatic.  Eyes:     Extraocular Movements: Extraocular movements intact.  Cardiovascular:     Rate and Rhythm: Normal rate.  Pulmonary:     Effort:  Pulmonary effort is normal.     Breath sounds: Normal breath sounds.  Musculoskeletal:     Cervical back: Normal range of motion and neck supple.     Right lower leg: No edema.     Left lower leg: No edema.  Skin:    General: Skin is dry.  Neurological:     Mental Status: She is alert and oriented to person, place, and time.     ED Results / Procedures / Treatments   Labs (all labs ordered are listed, but only abnormal results are displayed) Labs Reviewed  CBC - Abnormal; Notable for the following components:      Result Value   Hemoglobin 10.1 (*)    HCT 34.9 (*)    MCH 23.4 (*)    MCHC 28.9 (*)    RDW 17.1 (*)    All other components within normal limits  BASIC METABOLIC PANEL  TROPONIN I (HIGH SENSITIVITY)  TROPONIN I (HIGH SENSITIVITY)    EKG EKG Interpretation  Date/Time:  Wednesday September 30 2022 14:01:23 EDT Ventricular Rate:  82 PR Interval:  171 QRS Duration: 99 QT Interval:  380 QTC Calculation: 444 R Axis:   -62 Text Interpretation: Sinus rhythm Left anterior fascicular block Abnormal R-wave progression, late transition No acute changes No significant change since last tracing Confirmed by Derwood Kaplan [phone]) on 09/30/2022 3:05:44 PM  Radiology DG Chest 2 View  Result Date: 09/30/2022 CLINICAL DATA:  Chest pain. EXAM: CHEST - 2 VIEW COMPARISON:  December 18, 2021. FINDINGS: Stable cardiomediastinal silhouette. Both lungs are clear. The visualized skeletal structures are unremarkable. IMPRESSION: No active cardiopulmonary disease. Electronically Signed   By: Lupita Raider M.D.   On: 09/30/2022 14:31    Procedures Procedures    Medications Ordered in ED Medications  nitroGLYCERIN (NITROSTAT) SL tablet 0.4 mg (has no administration in time range)    ED Course/ Medical Decision Making/ A&P             HEART Score: 6                Medical Decision Making Amount and/or Complexity of Data Reviewed Labs: ordered. Radiology:  ordered.  Risk Prescription drug management.   This patient presents to the ED with chief complaint(s) of chest pain with pertinent past medical history of nonobstructive CAD, diabetes, hypertension.The complaint involves an extensive differential diagnosis and also carries with it a high risk of complications and morbidity.    The differential diagnosis includes acute coronary syndrome, angina, GERD, esophageal spasm, musculoskeletal chest pain.  Patient does not have any pleuritic component to the pain, low suspicion for PE.  The initial plan is to give patient nitroglycerin, get basic labs and reassess the patient.  If she is chest pain-free,  delta troponin are normal, then we can discharge patient with close follow-up with cardiology.  If patient's troponin is rising or she is not chest pain-free, then she will need admission to the hospital.  Patient's hear score is 6.   Additional history obtained: Records reviewed previous admission documents and previous cath has been reviewed from 2018, nonobstructive disease at that time.  Independent labs interpretation:  The following labs were independently interpreted: Initial high-sensitivity troponin is normal.  Independent visualization and interpretation of imaging: - I independently visualized the following imaging with scope of interpretation limited to determining acute life threatening conditions related to emergency care: X-ray of the chest, which revealed no pneumonia or pneumothorax, no pulmonary edema  Treatment and Reassessment: Patient reassessed.  She still has not received nitro.  Nursing staff made aware via secure chat.  Patient's care will be signed out to incoming team.   Final Clinical Impression(s) / ED Diagnoses Final diagnoses:  Precordial chest pain    Rx / DC Orders ED Discharge Orders     None         Derwood Kaplan, MD 09/30/22 1547

## 2022-09-30 NOTE — ED Notes (Signed)
Report called  

## 2022-09-30 NOTE — ED Provider Notes (Signed)
Signout from Dr. Rhunette Croft.  72 year old female here with chest pain an hour prior to arrival.  Plan is for delta troponin and medications.  She had a clean cath remotely, if no evidence of rising troponins and pain resolved can follow-up outpatient with cardiology.  If continues with chest pain or troponins elevated will need admission for further workup. Physical Exam  BP 133/77 (BP Location: Right Wrist)   Pulse 86   Temp 98.8 F (37.1 C) (Oral)   Resp 18   Ht 5\' 2"  (1.575 m)   Wt 112 kg   SpO2 96%   BMI 45.18 kg/m   Physical Exam  Procedures  Procedures  ED Course / MDM    Medical Decision Making Amount and/or Complexity of Data Reviewed Labs: ordered. Radiology: ordered.  Risk OTC drugs. Prescription drug management. Decision regarding hospitalization.   Patient's delta troponin unchanged.  She continues to endorse chest discomfort rating up into her neck.  She said this is a little bit different than the chest pain that she seen her heart doctor before.  She did not feel comfortable with discharge and outpatient follow-up with cardiology.  Have paged the hospitalist regarding admission.  Did also try GI cocktail without any change in her symptoms.  Discussed with Dr. Thomes Dinning [facility] hospitalist who will evaluate patient for admission.      Terrilee Files, MD 09/30/22 [id number]

## 2022-10-01 ENCOUNTER — Observation Stay (HOSPITAL_BASED_OUTPATIENT_CLINIC_OR_DEPARTMENT_OTHER): Payer: Medicare HMO

## 2022-10-01 DIAGNOSIS — R0789 Other chest pain: Secondary | ICD-10-CM

## 2022-10-01 DIAGNOSIS — R079 Chest pain, unspecified: Secondary | ICD-10-CM

## 2022-10-01 DIAGNOSIS — I350 Nonrheumatic aortic (valve) stenosis: Secondary | ICD-10-CM

## 2022-10-01 DIAGNOSIS — R002 Palpitations: Secondary | ICD-10-CM

## 2022-10-01 DIAGNOSIS — R072 Precordial pain: Secondary | ICD-10-CM | POA: Diagnosis not present

## 2022-10-01 LAB — GLUCOSE, CAPILLARY
Glucose-Capillary: 100 mg/dL — ABNORMAL HIGH (ref 70–99)
Glucose-Capillary: 108 mg/dL — ABNORMAL HIGH (ref 70–99)
Glucose-Capillary: 112 mg/dL — ABNORMAL HIGH (ref 70–99)
Glucose-Capillary: 115 mg/dL — ABNORMAL HIGH (ref 70–99)

## 2022-10-01 LAB — COMPREHENSIVE METABOLIC PANEL
ALT: 9 U/L (ref 0–44)
AST: 18 U/L (ref 15–41)
Albumin: 3.5 g/dL (ref 3.5–5.0)
Alkaline Phosphatase: 46 U/L (ref 38–126)
Anion gap: 9 (ref 5–15)
BUN: 14 mg/dL (ref 8–23)
CO2: 26 mmol/L (ref 22–32)
Calcium: 9.7 mg/dL (ref 8.9–10.3)
Chloride: 100 mmol/L (ref 98–111)
Creatinine, Ser: 0.92 mg/dL (ref 0.44–1.00)
GFR, Estimated: >60 mL/min (ref >60)
Glucose, Bld: 103 mg/dL — ABNORMAL HIGH (ref 70–99)
Potassium: 3.6 mmol/L (ref 3.5–5.1)
Sodium: 135 mmol/L (ref 135–145)
Total Bilirubin: 0.5 mg/dL (ref 0.3–1.2)
Total Protein: 6.7 g/dL (ref 6.5–8.1)

## 2022-10-01 LAB — ECHOCARDIOGRAM COMPLETE
AR max vel: 1.19 cm2
AV Area VTI: 1.19 cm2
AV Area mean vel: 1.47 cm2
AV Mean grad: 17 mmHg
AV Peak grad: 33.3 mmHg
Ao pk vel: 2.89 m/s
Area-P 1/2: 1.81 cm2
Height: 62 in
S' Lateral: 3.1 cm
Weight: 3968.28 oz

## 2022-10-01 LAB — CBC
HCT: 32.5 % — ABNORMAL LOW (ref 36.0–46.0)
Hemoglobin: 9.1 g/dL — ABNORMAL LOW (ref 12.0–15.0)
MCH: 23.1 pg — ABNORMAL LOW (ref 26.0–34.0)
MCHC: 28 g/dL — ABNORMAL LOW (ref 30.0–36.0)
MCV: 82.5 fL (ref 80.0–100.0)
Platelets: 300 10*3/uL (ref 150–400)
RBC: 3.94 MIL/uL (ref 3.87–5.11)
RDW: 17 % — ABNORMAL HIGH (ref 11.5–15.5)
WBC: 7.4 10*3/uL (ref 4.0–10.5)
nRBC: 0 % (ref 0.0–0.2)

## 2022-10-01 LAB — TROPONIN I (HIGH SENSITIVITY): Troponin I (High Sensitivity): 4 ng/L (ref <18)

## 2022-10-01 LAB — MAGNESIUM: Magnesium: 1.9 mg/dL (ref 1.7–2.4)

## 2022-10-01 LAB — PHOSPHORUS: Phosphorus: 3.4 mg/dL (ref 2.5–4.6)

## 2022-10-01 MED ORDER — TRAZODONE HCL 50 MG PO TABS
25.0000 mg | ORAL_TABLET | Freq: Every evening | ORAL | Status: DC | PRN
  Administered 2022-10-01: 25 mg via ORAL
  Filled 2022-10-01: qty 1

## 2022-10-01 MED ORDER — METOPROLOL SUCCINATE ER 50 MG PO TB24
50.0000 mg | ORAL_TABLET | Freq: Every day | ORAL | Status: DC
  Filled 2022-10-01: qty 1

## 2022-10-01 MED ORDER — NITROGLYCERIN 0.4 MG SL SUBL: 0.4000 mg | SUBLINGUAL_TABLET | SUBLINGUAL | 12 refills | Status: DC | PRN

## 2022-10-01 NOTE — Care Management Obs Status (Signed)
MEDICARE OBSERVATION STATUS NOTIFICATION   Patient Details  Name: Yesenia [name] MRN: [id number] Date of Birth: 10/09/1950   Medicare Observation Status Notification Given:  Yes    Corey Harold 10/01/2022, 4:10 PM

## 2022-10-01 NOTE — Discharge Summary (Signed)
Physician Discharge Summary  NADYNE GARIEPY UXL:[id number] DOB: 12-Jul-1950 DOA: 09/30/2022  PCP: Rica Records, FNP  Admit date: 09/30/2022  Discharge date: 10/01/2022  Admitted From:Home  Disposition:  Home  Recommendations for Outpatient Follow-up:  Follow up with PCP in 1-2 weeks Follow-up with cardiology in 1 year for aortic stenosis 2-week event monitor upon discharge Recommendations to initiate Ozempic for type 2 diabetes and weight loss  Home Health: None  Equipment/Devices: None  Discharge Condition:Stable  CODE STATUS: Full  Diet recommendation: Heart Healthy/carb modified  Brief/Interim Summary: RAMEEN GOHLKE is a 72 y.o. female with medical history significant of type 2 diabetes mellitus, GERD, essential hypertension, acquired hypothyroidism, morbid obesity who presents to the emergency department via EMS from home due to chest pain which started around noon today at rest.  She was admitted for evaluation of atypical chest pain that appears to be noncardiac.  2D echocardiogram with no significant findings noted.  She is having some palpitations for which 2-week event monitor will be arranged per cardiology.  She does have mild to moderate AAS which will require outpatient surveillance in 1 year.  No other acute events or concerns noted and she is no longer having any further chest pain.  No other acute events or concerns noted and she is in stable condition for discharge.  Discharge Diagnoses:  Principal Problem:   Atypical chest pain Active Problems:   GERD (gastroesophageal reflux disease)   Essential hypertension   Acquired hypothyroidism   Obesity, Class III, BMI 40-49.9 (morbid obesity) (HCC)   Insomnia   Type 2 diabetes mellitus with hyperglycemia (HCC)  Principal discharge diagnosis: Noncardiac chest pain with associated palpitations.  Mild to moderate AS.  Discharge Instructions  Discharge Instructions     Diet - low sodium heart  healthy   Complete by: As directed    Increase activity slowly   Complete by: As directed       Allergies as of 10/01/2022       Reactions   Aspirin Other (See Comments)   G.I. Upset    Nalbuphine Itching, Swelling, Rash   Nubaine        Medication List     TAKE these medications    ASHWAGANDHA PO Take 1 tablet by mouth daily.   Blood Glucose System Pak Kit Please dispense as Accu Chek Guide. USE TO TEST BLOOD SUGAR 3 TIMES DAILY. Dx: E11.65.   Blood Glucose Monitoring Suppl Devi 1 each by Does not apply route in the morning, at noon, and at bedtime. May substitute to any manufacturer covered by patient's insurance.   BLOOD GLUCOSE TEST STRIPS Strp Please dispense as Accu Chek Guide. USE TO TEST BLOOD SUGAR 3 TIMES DAILY. Dx: E11.65   docusate sodium 100 MG capsule Commonly known as: Colace Take 1 capsule (100 mg total) by mouth 2 (two) times daily.   Lancet Devices Misc Please dispense based on patient and insurance preference. Monitor FSBS 3x daily. Dx: E11.65   Lancets Misc Please dispense as Accu Chek Guide. USE TO TEST BLOOD SUGAR 3 TIMES DAILY. Dx: E11.65.   levothyroxine 125 MCG tablet Commonly known as: SYNTHROID Take 1 tablet (125 mcg total) by mouth daily. What changed: how much to take   Linzess 290 MCG Caps capsule Generic drug: linaclotide TAKE 1 CAPSULE ONCE DAILY BEFORE BREAKFAST What changed: See the new instructions.   metFORMIN 500 MG tablet Commonly known as: GLUCOPHAGE Take 1 tablet (500 mg total) by mouth 2 (two) times  daily with a meal.   metoprolol succinate 50 MG 24 hr tablet Commonly known as: TOPROL-XL take 1 tablet once daily.   nitroGLYCERIN 0.4 MG SL tablet Commonly known as: NITROSTAT Place 1 tablet (0.4 mg total) under the tongue every 5 (five) minutes as needed for chest pain.   polyethylene glycol 17 g packet Commonly known as: MiraLax Take 17 g by mouth daily.   RABEprazole 20 MG tablet Commonly known as:  ACIPHEX Take 1 tablet (20 mg total) by mouth daily before breakfast.   telmisartan-hydrochlorothiazide 40-12.5 MG tablet Commonly known as: MICARDIS HCT Take 1 tablet by mouth daily.   traZODone 50 MG tablet Commonly known as: DESYREL Take 0.5 tablets (25 mg total) by mouth at bedtime as needed for sleep.   vitamin B-12 100 MCG tablet Commonly known as: CYANOCOBALAMIN Take 100 mcg by mouth daily.        Follow-up Information     Del Newman Nip, Tenna Child, [place]. Schedule an appointment as soon as possible for a visit in 1 week(s).   Specialty: Family Medicine Contact information: [address] [place] [address] [phone]         Marjo Bicker, MD Follow up on 03/26/2023.   Specialties: Cardiology, Internal Medicine Why: Cardiology Follow-up on 03/26/2023 at Contact information: [address] [place] [address] [phone]                Allergies  Allergen Reactions   Aspirin Other (See Comments)    G.I. Upset    Nalbuphine Itching, Swelling and Rash    Nubaine     Consultations: Cardiology   Procedures/Studies: ECHOCARDIOGRAM COMPLETE  Result Date: 10/01/2022    ECHOCARDIOGRAM REPORT   Patient Name:   NHYLA NAPPI Date of Exam: 10/01/2022 Medical Rec #:  [id number]         Height:       62.0 in Accession #:    [id number]        Weight:       248.0 lb Date of Birth:  [birth year]         BSA:          2.095 m Patient Age:    71 years          BP:           108/48 mmHg Patient Gender: F                 HR:           62 bpm. [facility] Procedure: 2D Echo, Cardiac Doppler and Color Doppler Indications:    Chest Pain R07.9  History:        Patient has prior history of Echocardiogram examinations, most                 recent 08/09/2020. CAD, Signs/Symptoms:Shortness of Breath and                 Murmur; Risk Factors:Hypertension, Diabetes and Dyslipidemia.  Sonographer:    Celesta Gentile RCS Referring Phys: [id number]  Ellsworth Lennox IMPRESSIONS  1. Left ventricular ejection fraction, by estimation, is 55 to 60%. The left ventricle has normal function. The left ventricle has no regional wall motion abnormalities. There is mild left ventricular hypertrophy. Left ventricular diastolic parameters are consistent with Grade I diastolic dysfunction (impaired relaxation).  2. Right ventricular systolic function is normal. The right ventricular size is normal.  3. Left  atrial size was moderately dilated.  4. The mitral valve is abnormal. No evidence of mitral valve regurgitation. No evidence of mitral stenosis. Severe mitral annular calcification.  5. The aortic valve was not well visualized. Aortic valve regurgitation is not visualized. Mild to moderate aortic valve stenosis. Aortic valve area, by VTI measures 1.19 cm. Aortic valve mean gradient measures 17.0 mmHg. Aortic valve Vmax measures 2.88 m/s.  6. The inferior vena cava is normal in size with greater than 50% respiratory variability, suggesting right atrial pressure of 3 mmHg. Comparison(s): Changes from prior study are noted. Mild to moderate aortic stenosis now. FINDINGS  Left Ventricle: Left ventricular ejection fraction, by estimation, is 55 to 60%. The left ventricle has normal function. The left ventricle has no regional wall motion abnormalities. The left ventricular internal cavity size was normal in size. There is  mild left ventricular hypertrophy. Left ventricular diastolic parameters are consistent with Grade I diastolic dysfunction (impaired relaxation). Right Ventricle: The right ventricular size is normal. No increase in right ventricular wall thickness. Right ventricular systolic function is normal. Left Atrium: Left atrial size was moderately dilated. Right Atrium: Right atrial size was normal in size. Pericardium: There is no evidence of pericardial effusion. Mitral Valve: The mitral valve is abnormal. Severe mitral annular calcification. No evidence of  mitral valve regurgitation. No evidence of mitral valve stenosis. Tricuspid Valve: The tricuspid valve is not well visualized. Tricuspid valve regurgitation is not demonstrated. No evidence of tricuspid stenosis. Aortic Valve: The aortic valve was not well visualized. Aortic valve regurgitation is not visualized. Mild to moderate aortic stenosis is present. Aortic valve mean gradient measures 17.0 mmHg. Aortic valve peak gradient measures 33.3 mmHg. Aortic valve area, by VTI measures 1.19 cm. Pulmonic Valve: The pulmonic valve was not well visualized. Pulmonic valve regurgitation is not visualized. No evidence of pulmonic stenosis. Aorta: The aortic root is normal in size and structure. Venous: The inferior vena cava is normal in size with greater than 50% respiratory variability, suggesting right atrial pressure of 3 mmHg. IAS/Shunts: No atrial level shunt detected by color flow Doppler.  LEFT VENTRICLE PLAX 2D LVIDd:         5.00 cm   Diastology LVIDs:         3.10 cm   LV e' medial:    5.66 cm/s LV PW:         1.30 cm   LV E/e' medial:  22.3 LV IVS:        1.20 cm   LV e' lateral:   8.05 cm/s LVOT diam:     1.80 cm   LV E/e' lateral: 15.7 LV SV:         74 LV SV Index:   35 LVOT Area:     2.54 cm  RIGHT VENTRICLE RV S prime:     11.00 cm/s TAPSE (M-mode): 2.0 cm LEFT ATRIUM             Index        RIGHT ATRIUM           Index LA diam:        4.50 cm 2.15 cm/m   RA Area:     16.00 cm LA Vol (A2C):   66.2 ml 31.60 ml/m  RA Volume:   34.90 ml  16.66 ml/m LA Vol (A4C):   97.9 ml 46.74 ml/m LA Biplane Vol: 82.5 ml 39.38 ml/m  AORTIC VALVE AV Area (Vmax):    1.19 cm AV Area (  Vmean):   1.47 cm AV Area (VTI):     1.19 cm AV Vmax:           288.50 cm/s AV Vmean:          183.500 cm/s AV VTI:            0.616 m AV Peak Grad:      33.3 mmHg AV Mean Grad:      17.0 mmHg LVOT Vmax:         135.00 cm/s LVOT Vmean:        106.000 cm/s LVOT VTI:          0.289 m LVOT/AV VTI ratio: 0.47  AORTA Ao Root diam: 3.10 cm  MITRAL VALVE MV Area (PHT): 1.81 cm     SHUNTS MV Decel Time: 419 msec     Systemic VTI:  0.29 m MV E velocity: 126.00 cm/s  Systemic Diam: 1.80 cm MV A velocity: 162.00 cm/s MV E/A ratio:  0.78 Vishnu Priya Mallipeddi Electronically signed by Winfield Rast Mallipeddi Signature Date/Time: 10/01/2022/1:23:45 PM    Final    DG Chest 2 View  Result Date: 09/30/2022 CLINICAL DATA:  Chest pain. EXAM: CHEST - 2 VIEW COMPARISON:  December 18, 2021. FINDINGS: Stable cardiomediastinal silhouette. Both lungs are clear. The visualized skeletal structures are unremarkable. IMPRESSION: No active cardiopulmonary disease. Electronically Signed   By: Lupita Raider M.D.   On: 09/30/2022 14:31     Discharge Exam: Vitals:   10/01/22 1013 10/01/22 1320  BP: (!) 108/48 (!) 110/38  Pulse: 62 67  Resp: 18 19  Temp: 98.3 F (36.8 C) 98 F (36.7 C)  SpO2: 94% 99%   Vitals:   10/01/22 0805 10/01/22 1012 10/01/22 1013 10/01/22 1320  BP: (!) 115/55 (!) 108/48 (!) 108/48 (!) 110/38  Pulse: 65 62 62 67  Resp: 18  18 19   Temp: 98 F (36.7 C)  98.3 F (36.8 C) 98 F (36.7 C)  TempSrc: Oral  Oral   SpO2: 98%  94% 99%  Weight:      Height:        General: Pt is alert, awake, not in acute distress, morbidly obese Cardiovascular: RRR, S1/S2 +, no rubs, no gallops Respiratory: CTA bilaterally, no wheezing, no rhonchi Abdominal: Soft, NT, ND, bowel sounds + Extremities: no edema, no cyanosis    The results of significant diagnostics from this hospitalization (including imaging, microbiology, ancillary and laboratory) are listed below for reference.     Microbiology: No results found for this or any previous visit (from the past 240 hour(s)).   Labs: BNP (last 3 results) No results for input(s): "BNP" in the last 8760 hours. Basic Metabolic Panel: Recent Labs  Lab 09/30/22 1526 10/01/22 0407  NA 133* 135  K 3.6 3.6  CL 102 100  CO2 22 26  GLUCOSE 118* 103*  BUN 14 14  CREATININE 0.95 0.92   CALCIUM 9.6 9.7  MG  --  1.9  PHOS  --  3.4   Liver Function Tests: Recent Labs  Lab 10/01/22 0407  AST 18  ALT 9  ALKPHOS 46  BILITOT 0.5  PROT 6.7  ALBUMIN 3.5   No results for input(s): "LIPASE", "AMYLASE" in the last 168 hours. No results for input(s): "AMMONIA" in the last 168 hours. CBC: Recent Labs  Lab 09/30/22 1526 10/01/22 0407  WBC 7.1 7.4  HGB 10.1* 9.1*  HCT 34.9* 32.5*  MCV 81.0 82.5  PLT 333 300  Cardiac Enzymes: No results for input(s): "CKTOTAL", "CKMB", "CKMBINDEX", "TROPONINI" in the last 168 hours. BNP: Invalid input(s): "POCBNP" CBG: Recent Labs  Lab 09/30/22 2137 10/01/22 0007 10/01/22 0352 10/01/22 0720 10/01/22 1120  GLUCAP 157* 115* 100* 112* 108*   D-Dimer No results for input(s): "DDIMER" in the last 72 hours. Hgb A1c No results for input(s): "HGBA1C" in the last 72 hours. Lipid Profile No results for input(s): "CHOL", "HDL", "LDLCALC", "TRIG", "CHOLHDL", "LDLDIRECT" in the last 72 hours. Thyroid function studies No results for input(s): "TSH", "T4TOTAL", "T3FREE", "THYROIDAB" in the last 72 hours.  Invalid input(s): "FREET3" Anemia work up No results for input(s): "VITAMINB12", "FOLATE", "FERRITIN", "TIBC", "IRON", "RETICCTPCT" in the last 72 hours. Urinalysis    Component Value Date/Time   COLORURINE YELLOW 09/30/2022 1602   APPEARANCEUR HAZY (A) 09/30/2022 1602   LABSPEC 1.027 09/30/2022 1602   PHURINE 5.0 09/30/2022 1602   GLUCOSEU NEGATIVE 09/30/2022 1602   HGBUR NEGATIVE 09/30/2022 1602   BILIRUBINUR NEGATIVE 09/30/2022 1602   KETONESUR NEGATIVE 09/30/2022 1602   PROTEINUR NEGATIVE 09/30/2022 1602   UROBILINOGEN 0.2 12/30/2012 1300   NITRITE NEGATIVE 09/30/2022 1602   LEUKOCYTESUR NEGATIVE 09/30/2022 1602   Sepsis Labs Recent Labs  Lab 09/30/22 1526 10/01/22 0407  WBC 7.1 7.4   Microbiology No results found for this or any previous visit (from the past 240 hour(s)).   Time coordinating discharge: 35  minutes  SIGNED:   Erick Blinks, DO [facility] 10/01/2022, 3:31 PM  If 7PM-7AM, please contact night-coverage [email or web address]
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
## 2022-10-01 NOTE — TOC CM/SW Note (Signed)
Transition of Care [facility]) - Inpatient Brief Assessment   Patient Details  Name: Yesenia [name] MRN: [id number] Date of Birth: 09-14-1950  Transition of Care [facility]) CM/SW Contact:    Karn Cassis, LCSW Phone Number: 10/01/2022, 9:36 AM   Clinical Narrative: Pt admitted due to atypical chest pain. Pt reports she lives with adult son and is independent with ADLs. No needs reported at this time. TOC will follow.     Transition of Care Asessment: Insurance and Status: Insurance coverage has been reviewed Patient has primary care physician: Yes Home environment has been reviewed: Lives with adult son. Prior level of function:: Independent. Prior/Current Home Services: No current home services Social Determinants of Health Reivew: SDOH reviewed no interventions necessary Readmission risk has been reviewed: Yes Transition of care needs: no transition of care needs at this time

## 2022-10-01 NOTE — Progress Notes (Signed)
Pt ambulatory to bathroom independently this shift, pt has not c/o chest pain this shift.

## 2022-10-01 NOTE — Progress Notes (Signed)
*  PRELIMINARY RESULTS* Echocardiogram 2D Echocardiogram has been performed.  Stacey Drain 10/01/2022, 12:27 PM

## 2022-10-09 ENCOUNTER — Other Ambulatory Visit: Payer: Self-pay | Admitting: Family Medicine

## 2022-10-09 DIAGNOSIS — I10 Essential (primary) hypertension: Secondary | ICD-10-CM

## 2022-10-11 ENCOUNTER — Other Ambulatory Visit: Payer: Self-pay | Admitting: Family Medicine

## 2022-10-12 ENCOUNTER — Other Ambulatory Visit: Payer: Self-pay | Admitting: Family Medicine

## 2022-10-12 ENCOUNTER — Telehealth: Payer: Self-pay

## 2022-10-12 DIAGNOSIS — R002 Palpitations: Secondary | ICD-10-CM

## 2022-10-12 NOTE — Telephone Encounter (Signed)
-----   Message from Ellsworth Lennox, [place] sent at 10/01/2022 11:11 AM EDT ----- Regarding: Outpatient Monitor This patient needs an outpatient monitor for palpitations per Dr. Jenene Slicker. Can be mailed to the patient after hospital discharge (likely going home today or tomorrow).   Thanks,  [place]

## 2022-10-12 NOTE — Telephone Encounter (Signed)
Zio 14 day Monitor ordered

## 2022-10-14 ENCOUNTER — Ambulatory Visit: Payer: Medicare HMO | Admitting: Gastroenterology

## 2022-11-04 ENCOUNTER — Other Ambulatory Visit: Payer: Self-pay | Admitting: Family Medicine

## 2022-11-04 DIAGNOSIS — I10 Essential (primary) hypertension: Secondary | ICD-10-CM

## 2022-11-10 ENCOUNTER — Other Ambulatory Visit: Payer: Self-pay | Admitting: Family Medicine

## 2022-11-10 DIAGNOSIS — F5101 Primary insomnia: Secondary | ICD-10-CM

## 2022-11-25 ENCOUNTER — Other Ambulatory Visit: Payer: Self-pay | Admitting: Family Medicine

## 2022-11-25 DIAGNOSIS — I10 Essential (primary) hypertension: Secondary | ICD-10-CM

## 2022-12-10 ENCOUNTER — Inpatient Hospital Stay (HOSPITAL_COMMUNITY): Admission: RE | Admit: 2022-12-10 | Payer: Medicare HMO | Source: Ambulatory Visit

## 2022-12-13 ENCOUNTER — Other Ambulatory Visit: Payer: Self-pay | Admitting: Family Medicine
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
## 2022-12-21 ENCOUNTER — Encounter: Payer: Self-pay | Admitting: Gastroenterology

## 2022-12-21 ENCOUNTER — Ambulatory Visit: Payer: Medicare HMO | Admitting: Gastroenterology

## 2023-01-06 ENCOUNTER — Other Ambulatory Visit: Payer: Self-pay | Admitting: Family Medicine

## 2023-01-06 DIAGNOSIS — I10 Essential (primary) hypertension: Secondary | ICD-10-CM

## 2023-01-06 DIAGNOSIS — F5101 Primary insomnia: Secondary | ICD-10-CM

## 2023-01-11 ENCOUNTER — Other Ambulatory Visit: Payer: Self-pay | Admitting: Family Medicine

## 2023-01-11 DIAGNOSIS — I10 Essential (primary) hypertension: Secondary | ICD-10-CM

## 2023-01-25 ENCOUNTER — Telehealth: Payer: Self-pay | Admitting: Gastroenterology

## 2023-01-25 NOTE — Telephone Encounter (Signed)
Patient overdue for 3 month ov follow up for anemia.  Would recommend updated labs cbc, iron/tibc/ferritin, b12 with ov 1-2 weeks after labs completed

## 2023-01-26 NOTE — Telephone Encounter (Signed)
Lmom for pt to return call. 

## 2023-01-27 NOTE — Telephone Encounter (Signed)
Lmom for pt to return call. 

## 2023-01-28 NOTE — Telephone Encounter (Signed)
Lmom for pt to return call, sent pt a myChart message.

## 2023-02-01 ENCOUNTER — Other Ambulatory Visit: Payer: Self-pay | Admitting: Gastroenterology

## 2023-02-01 ENCOUNTER — Other Ambulatory Visit: Payer: Self-pay | Admitting: Family Medicine

## 2023-02-01 DIAGNOSIS — I10 Essential (primary) hypertension: Secondary | ICD-10-CM

## 2023-02-05 ENCOUNTER — Inpatient Hospital Stay (HOSPITAL_COMMUNITY): Admission: RE | Admit: 2023-02-05 | Payer: Medicare HMO | Source: Ambulatory Visit

## 2023-02-05 ENCOUNTER — Ambulatory Visit: Payer: Medicare HMO | Admitting: Family Medicine

## 2023-02-05 ENCOUNTER — Other Ambulatory Visit: Payer: Self-pay | Admitting: Family Medicine

## 2023-02-05 DIAGNOSIS — F5101 Primary insomnia: Secondary | ICD-10-CM

## 2023-02-05 NOTE — Telephone Encounter (Signed)
Yes with 3 refills

## 2023-02-08 ENCOUNTER — Other Ambulatory Visit: Payer: Self-pay

## 2023-02-08 DIAGNOSIS — F5101 Primary insomnia: Secondary | ICD-10-CM

## 2023-02-08 MED ORDER — TRAZODONE HCL 50 MG PO TABS
50.0000 mg | ORAL_TABLET | Freq: Every day | ORAL | 3 refills | Status: DC
Start: 2023-02-08 — End: 2023-03-08

## 2023-02-08 NOTE — Telephone Encounter (Signed)
Done. Thank you.

## 2023-02-09 MED ORDER — TRAZODONE HCL 50 MG PO TABS
50.0000 mg | ORAL_TABLET | Freq: Every day | ORAL | 3 refills | Status: DC
Start: 2023-02-09 — End: 2023-03-08

## 2023-03-05 ENCOUNTER — Other Ambulatory Visit: Payer: Self-pay | Admitting: Family Medicine

## 2023-03-05 DIAGNOSIS — I10 Essential (primary) hypertension: Secondary | ICD-10-CM

## 2023-03-06 NOTE — Patient Instructions (Signed)

## 2023-03-06 NOTE — Progress Notes (Unsigned)
   Established Patient Office Visit   Subjective  Patient ID: Yesenia [name], female    DOB: 09-24-1950  Age: 72 y.o. MRN: [id number]  No chief complaint on file.   She  has a past medical history of Anxiety, Carpal tunnel syndrome, Complication of anesthesia, DDD (degenerative disc disease), lumbar, DVT (deep venous thrombosis) (HCC) (10/17/2012), Essential hypertension, Facet arthritis of lumbar region, Fatty liver, Fibromyalgia, GERD (gastroesophageal reflux disease), Glaucoma, H/O hiatal hernia, Hypothyroidism, Hypothyroidism, Kidney stones, Osteoporosis, PE (pulmonary embolism) (10/17/2012), Pneumonia, S/P cardiac cath (6578,4696), Status post placement of implantable loop recorder, and Type 2 diabetes mellitus (HCC).  HPI  ROS    Objective:     There were no vitals taken for this visit. {Vitals History (Optional):23777}  Physical Exam   No results found for any visits on 03/08/23.  The 10-year ASCVD risk score (Arnett DK, et al., 2019) is: 20.8%    Assessment & Plan:  There are no diagnoses linked to this encounter.  No follow-ups on file.   Cruzita Lederer Newman Nip, FNP

## 2023-03-08 ENCOUNTER — Ambulatory Visit (INDEPENDENT_AMBULATORY_CARE_PROVIDER_SITE_OTHER): Payer: Medicare HMO | Admitting: Family Medicine

## 2023-03-08 ENCOUNTER — Encounter: Payer: Self-pay | Admitting: Family Medicine

## 2023-03-08 VITALS — BP 139/82 | HR 89 | Ht 62.0 in | Wt 249.1 lb

## 2023-03-08 DIAGNOSIS — E038 Other specified hypothyroidism: Secondary | ICD-10-CM | POA: Diagnosis not present

## 2023-03-08 DIAGNOSIS — Z23 Encounter for immunization: Secondary | ICD-10-CM | POA: Diagnosis not present

## 2023-03-08 DIAGNOSIS — E559 Vitamin D deficiency, unspecified: Secondary | ICD-10-CM

## 2023-03-08 DIAGNOSIS — E1169 Type 2 diabetes mellitus with other specified complication: Secondary | ICD-10-CM

## 2023-03-08 DIAGNOSIS — I10 Essential (primary) hypertension: Secondary | ICD-10-CM | POA: Diagnosis not present

## 2023-03-08 DIAGNOSIS — F419 Anxiety disorder, unspecified: Secondary | ICD-10-CM

## 2023-03-08 DIAGNOSIS — F32A Depression, unspecified: Secondary | ICD-10-CM

## 2023-03-08 DIAGNOSIS — E538 Deficiency of other specified B group vitamins: Secondary | ICD-10-CM

## 2023-03-08 DIAGNOSIS — D509 Iron deficiency anemia, unspecified: Secondary | ICD-10-CM | POA: Diagnosis not present

## 2023-03-08 DIAGNOSIS — E119 Type 2 diabetes mellitus without complications: Secondary | ICD-10-CM

## 2023-03-08 MED ORDER — SERTRALINE HCL 25 MG PO TABS
25.0000 mg | ORAL_TABLET | Freq: Every day | ORAL | 3 refills | Status: DC
Start: 2023-03-08 — End: 2023-07-01

## 2023-03-08 MED ORDER — TRAZODONE HCL 100 MG PO TABS
100.0000 mg | ORAL_TABLET | Freq: Every day | ORAL | 3 refills | Status: DC
Start: 2023-03-08 — End: 2023-12-16

## 2023-03-08 MED ORDER — GABAPENTIN 300 MG PO CAPS: 300.0000 mg | ORAL_CAPSULE | Freq: Every evening | ORAL | 3 refills | Status: DC | PRN

## 2023-03-08 NOTE — Assessment & Plan Note (Signed)
    03/08/2023    9:54 AM 06/09/2022   10:11 AM  GAD 7 : Generalized Anxiety Score  Nervous, Anxious, on Edge 3 3  Control/stop worrying 3 2  Worry too much - different things 3 3  Trouble relaxing 3 2  Restless 3 0  Easily annoyed or irritable 3 3  Afraid - awful might happen 3 1  Total GAD 7 Score 21 14  Anxiety Difficulty Somewhat difficult Somewhat difficult     Flowsheet Row Office Visit from 03/08/2023 in [facility]  PHQ-9 Total Score 23     Trial on Zoloft 25 mg Trazodone 100 mg at Bedtime Gabapentin PRN  Supportive Therapy: Recommend regular counseling or therapy to address family-related stressors and develop coping mechanisms. Sleep Hygiene: Provide education on improving sleep habits, including reducing caffeine intake, establishing a consistent bedtime routine, and managing nighttime anxiety through relaxation techniques. Follow-Up: Schedule a follow-up visit in 8 weeks to assess response to treatment and make adjustments as needed.  Referral to behavioral health services  Patient verbally consented to [facility] services about presenting concerns and psychiatric consultation as appropriate. The services will be billed as appropriate for the patient.

## 2023-03-08 NOTE — Assessment & Plan Note (Signed)
Labs ordered. Discussed with  patient to monitor their blood pressure regularly and maintain a heart-healthy diet rich in fruits, vegetables, whole grains, and low-fat dairy, while reducing sodium intake to less than 2,300 mg per day. Regular physical activity, such as 30 minutes of moderate exercise most days of the week, will help lower blood pressure and improve overall cardiovascular health. Avoiding smoking, limiting alcohol consumption, and managing stress. Take  prescribed medication, & take it as directed and avoid skipping doses. Seek emergency care if your blood pressure is (over 180/100) or you experience chest pain, shortness of breath, or sudden vision changes.Patient verbalizes understanding regarding plan of care and all questions answered. Follow up in  4 weeks via my chart with at home blood pressure to monitor trends.

## 2023-03-09 LAB — CMP14+EGFR
ALT: 9 [IU]/L (ref 0–32)
AST: 14 [IU]/L (ref 0–40)
Albumin: 4 g/dL (ref 3.8–4.8)
Alkaline Phosphatase: 92 [IU]/L (ref 44–121)
BUN/Creatinine Ratio: 13 (ref 12–28)
BUN: 13 mg/dL (ref 8–27)
Bilirubin Total: 0.2 mg/dL (ref 0.0–1.2)
CO2: 22 mmol/L (ref 20–29)
Calcium: 10.2 mg/dL (ref 8.7–10.3)
Chloride: 102 mmol/L (ref 96–106)
Creatinine, Ser: 0.97 mg/dL (ref 0.57–1.00)
Globulin, Total: 3.2 g/dL (ref 1.5–4.5)
Glucose: 90 mg/dL (ref 70–99)
Potassium: 4 mmol/L (ref 3.5–5.2)
Sodium: 140 mmol/L (ref 134–144)
Total Protein: 7.2 g/dL (ref 6.0–8.5)
eGFR: 62 mL/min/{1.73_m2} (ref >59)

## 2023-03-09 LAB — LIPID PANEL
Chol/HDL Ratio: 3.2 {ratio} (ref 0.0–4.4)
Cholesterol, Total: 165 mg/dL (ref 100–199)
HDL: 51 mg/dL (ref >39)
LDL Chol Calc (NIH): 95 mg/dL (ref 0–99)
Triglycerides: 107 mg/dL (ref 0–149)
VLDL Cholesterol Cal: 19 mg/dL (ref 5–40)

## 2023-03-09 LAB — HEMOGLOBIN A1C
Est. average glucose Bld gHb Est-mCnc: 128 mg/dL
Hgb A1c MFr Bld: 6.1 % — ABNORMAL HIGH (ref 4.8–5.6)

## 2023-03-09 LAB — CBC WITH DIFFERENTIAL/PLATELET
Basophils Absolute: 0.1 10*3/uL (ref 0.0–0.2)
Basos: 1 %
EOS (ABSOLUTE): 0.4 10*3/uL (ref 0.0–0.4)
Eos: 5 %
Hematocrit: 34.2 % (ref 34.0–46.6)
Hemoglobin: 9.7 g/dL — ABNORMAL LOW (ref 11.1–15.9)
Immature Grans (Abs): 0 10*3/uL (ref 0.0–0.1)
Immature Granulocytes: 0 %
Lymphocytes Absolute: 1.7 10*3/uL (ref 0.7–3.1)
Lymphs: 18 %
MCH: 22.6 pg — ABNORMAL LOW (ref 26.6–33.0)
MCHC: 28.4 g/dL — ABNORMAL LOW (ref 31.5–35.7)
MCV: 80 fL (ref 79–97)
Monocytes Absolute: 0.5 10*3/uL (ref 0.1–0.9)
Monocytes: 6 %
Neutrophils Absolute: 6.6 10*3/uL (ref 1.4–7.0)
Neutrophils: 70 %
Platelets: 433 10*3/uL (ref 150–450)
RBC: 4.29 x10E6/uL (ref 3.77–5.28)
RDW: 14.7 % (ref 11.7–15.4)
WBC: 9.4 10*3/uL (ref 3.4–10.8)

## 2023-03-09 LAB — IRON,TIBC AND FERRITIN PANEL
Ferritin: 21 ng/mL (ref 15–150)
Iron Saturation: 5 % — CL (ref 15–55)
Iron: 18 ug/dL — ABNORMAL LOW (ref 27–139)
Total Iron Binding Capacity: 384 ug/dL (ref 250–450)
UIBC: 366 ug/dL (ref 118–369)

## 2023-03-09 LAB — VITAMIN D 25 HYDROXY (VIT D DEFICIENCY, FRACTURES): Vit D, 25-Hydroxy: 8.5 ng/mL — ABNORMAL LOW (ref 30.0–100.0)

## 2023-03-09 LAB — TSH+FREE T4
Free T4: 0.41 ng/dL — ABNORMAL LOW (ref 0.82–1.77)
TSH: 64 u[IU]/mL — ABNORMAL HIGH (ref 0.450–4.500)

## 2023-03-09 LAB — VITAMIN B12: Vitamin B-12: 137 pg/mL — ABNORMAL LOW (ref 232–1245)

## 2023-03-10 ENCOUNTER — Other Ambulatory Visit: Payer: Self-pay | Admitting: Family Medicine

## 2023-03-10 DIAGNOSIS — D509 Iron deficiency anemia, unspecified: Secondary | ICD-10-CM

## 2023-03-10 DIAGNOSIS — E039 Hypothyroidism, unspecified: Secondary | ICD-10-CM

## 2023-03-15 ENCOUNTER — Encounter: Payer: Self-pay | Admitting: Family Medicine

## 2023-03-15 NOTE — Telephone Encounter (Signed)
 Care team updated and letter sent for eye exam notes.

## 2023-03-26 ENCOUNTER — Encounter: Payer: Self-pay | Admitting: Internal Medicine

## 2023-03-26 ENCOUNTER — Ambulatory Visit: Payer: Medicare HMO | Attending: Internal Medicine | Admitting: Internal Medicine

## 2023-03-26 NOTE — Progress Notes (Signed)
Erroneous encounter - please disregard.

## 2023-04-02 ENCOUNTER — Other Ambulatory Visit: Payer: Self-pay | Admitting: Family Medicine

## 2023-04-02 DIAGNOSIS — I10 Essential (primary) hypertension: Secondary | ICD-10-CM

## 2023-04-05 NOTE — Progress Notes (Deleted)
   Established Patient Office Visit   Subjective  Patient ID: Yesenia [name], female    DOB: 09/21/1950  Age: 72 y.o. MRN: [id number]  No chief complaint on file.   She  has a past medical history of Anxiety, Carpal tunnel syndrome, Complication of anesthesia, DDD (degenerative disc disease), lumbar, DVT (deep venous thrombosis) (HCC) (10/17/2012), Essential hypertension, Facet arthritis of lumbar region, Fatty liver, Fibromyalgia, GERD (gastroesophageal reflux disease), Glaucoma, H/O hiatal hernia, Hypothyroidism, Hypothyroidism, Kidney stones, Osteoporosis, PE (pulmonary embolism) (10/17/2012), Pneumonia, S/P cardiac cath (8005,8002), Status post placement of implantable loop recorder, and Type 2 diabetes mellitus (HCC).  Back Pain    Review of Systems  Musculoskeletal:  Positive for back pain.      Objective:     There were no vitals taken for this visit. {Vitals History (Optional):23777}  Physical Exam   No results found for any visits on 04/08/23.  The 10-year ASCVD risk score (Arnett DK, et al., 2019) is: 31.1%    Assessment & Plan:  There are no diagnoses linked to this encounter.  No follow-ups on file.   Yesenia Kidd Wilhelmena Falter, FNP

## 2023-04-05 NOTE — Patient Instructions (Signed)
        Great to see you today.  I have refilled the medication(s) we provide.     - Please take medications as prescribed. - Follow up with your primary health provider if any health concerns arises. - If symptoms worsen please contact your primary care provider and/or visit the emergency department.

## 2023-04-06 DIAGNOSIS — I10 Essential (primary) hypertension: Secondary | ICD-10-CM | POA: Diagnosis not present

## 2023-04-06 DIAGNOSIS — R55 Syncope and collapse: Secondary | ICD-10-CM | POA: Diagnosis not present

## 2023-04-06 DIAGNOSIS — N3000 Acute cystitis without hematuria: Secondary | ICD-10-CM | POA: Diagnosis not present

## 2023-04-06 DIAGNOSIS — B9620 Unspecified Escherichia coli [E. coli] as the cause of diseases classified elsewhere: Secondary | ICD-10-CM | POA: Diagnosis not present

## 2023-04-06 DIAGNOSIS — B954 Other streptococcus as the cause of diseases classified elsewhere: Secondary | ICD-10-CM | POA: Diagnosis not present

## 2023-04-06 DIAGNOSIS — I350 Nonrheumatic aortic (valve) stenosis: Secondary | ICD-10-CM | POA: Diagnosis not present

## 2023-04-06 DIAGNOSIS — E119 Type 2 diabetes mellitus without complications: Secondary | ICD-10-CM | POA: Diagnosis not present

## 2023-04-06 DIAGNOSIS — Z79899 Other long term (current) drug therapy: Secondary | ICD-10-CM | POA: Diagnosis not present

## 2023-04-06 DIAGNOSIS — I2510 Atherosclerotic heart disease of native coronary artery without angina pectoris: Secondary | ICD-10-CM | POA: Diagnosis not present

## 2023-04-06 DIAGNOSIS — I3481 Nonrheumatic mitral (valve) annulus calcification: Secondary | ICD-10-CM | POA: Diagnosis not present

## 2023-04-06 DIAGNOSIS — K219 Gastro-esophageal reflux disease without esophagitis: Secondary | ICD-10-CM | POA: Diagnosis not present

## 2023-04-06 DIAGNOSIS — Z7984 Long term (current) use of oral hypoglycemic drugs: Secondary | ICD-10-CM | POA: Diagnosis not present

## 2023-04-06 DIAGNOSIS — E669 Obesity, unspecified: Secondary | ICD-10-CM | POA: Diagnosis not present

## 2023-04-06 DIAGNOSIS — N390 Urinary tract infection, site not specified: Secondary | ICD-10-CM | POA: Diagnosis not present

## 2023-04-06 DIAGNOSIS — Z794 Long term (current) use of insulin: Secondary | ICD-10-CM | POA: Diagnosis not present

## 2023-04-06 DIAGNOSIS — Z6841 Body Mass Index (BMI) 40.0 and over, adult: Secondary | ICD-10-CM | POA: Diagnosis not present

## 2023-04-06 DIAGNOSIS — Z792 Long term (current) use of antibiotics: Secondary | ICD-10-CM | POA: Diagnosis not present

## 2023-04-06 DIAGNOSIS — N179 Acute kidney failure, unspecified: Secondary | ICD-10-CM | POA: Diagnosis not present

## 2023-04-08 ENCOUNTER — Encounter: Payer: Medicare HMO | Admitting: Family Medicine

## 2023-04-09 ENCOUNTER — Telehealth: Payer: Self-pay

## 2023-04-09 NOTE — Progress Notes (Signed)
 Canceled.

## 2023-04-09 NOTE — Transitions of Care (Post Inpatient/ED Visit) (Signed)
   04/09/2023  Name: Yesenia [name] MRN: [id number] DOB: 1950-05-12  Today's TOC FU Call Status: Today's TOC FU Call Status:: Unsuccessful Call (1st Attempt) Unsuccessful Call (1st Attempt) Date: 04/09/23  Attempted to reach the patient regarding the most recent Inpatient/ED visit.  Follow Up Plan: Additional outreach attempts will be made to reach the patient to complete the Transitions of Care (Post Inpatient/ED visit) call.   Alan Ee, RN, BSN, CEN [employer]- Transition of Care Team.  [facility] [phone]

## 2023-04-12 ENCOUNTER — Telehealth: Payer: Self-pay

## 2023-04-12 NOTE — Transitions of Care (Post Inpatient/ED Visit) (Signed)
   04/12/2023  Name: Yesenia [name] MRN: [id number] DOB: 02-04-1951  Today's TOC FU Call Status: Today's TOC FU Call Status:: Unsuccessful Call (2nd Attempt) Unsuccessful Call (2nd Attempt) Date: 04/12/23  Attempted to reach the patient regarding the most recent Inpatient/ED visit.  Follow Up Plan: Additional outreach attempts will be made to reach the patient to complete the Transitions of Care (Post Inpatient/ED visit) call.   Alan Ee, RN, BSN, CEN [employer]- Transition of Care Team.  [facility] [phone]

## 2023-04-13 ENCOUNTER — Telehealth: Payer: Self-pay

## 2023-04-13 ENCOUNTER — Telehealth: Payer: Self-pay | Admitting: Family Medicine

## 2023-04-13 NOTE — Telephone Encounter (Signed)
 Copied from CRM [id number]. Topic: Referral - Status >> Apr 13, 2023  2:07 PM Carlatta H wrote: Reason for CRM: Darice at [facility] called to advised that patient refused services/ If we could give her until the 1/21 and she will reach back out to the patient//

## 2023-04-13 NOTE — Transitions of Care (Post Inpatient/ED Visit) (Signed)
   04/13/2023  Name: LARONDA LISBY MRN: [id number] DOB: 1950-08-23  Today's TOC FU Call Status: Today's TOC FU Call Status:: Unsuccessful Call (3rd Attempt) Unsuccessful Call (3rd Attempt) Date: 04/13/23  Attempted to reach the patient regarding the most recent Inpatient/ED visit.  Follow Up Plan: No further outreach attempts will be made at this time. We have been unable to contact the patient.  Alan Ee, RN, BSN, CEN [employer]- Transition of Care Team.  [facility] [phone]

## 2023-04-22 ENCOUNTER — Ambulatory Visit: Payer: Medicare HMO | Admitting: Internal Medicine

## 2023-04-22 ENCOUNTER — Encounter: Payer: Self-pay | Admitting: Internal Medicine

## 2023-04-22 VITALS — BP 115/65 | HR 89 | Ht 62.0 in | Wt 238.0 lb

## 2023-04-22 DIAGNOSIS — E039 Hypothyroidism, unspecified: Secondary | ICD-10-CM

## 2023-04-22 DIAGNOSIS — R55 Syncope and collapse: Secondary | ICD-10-CM | POA: Diagnosis not present

## 2023-04-22 DIAGNOSIS — I2510 Atherosclerotic heart disease of native coronary artery without angina pectoris: Secondary | ICD-10-CM | POA: Diagnosis not present

## 2023-04-22 DIAGNOSIS — N3000 Acute cystitis without hematuria: Secondary | ICD-10-CM | POA: Diagnosis not present

## 2023-04-22 DIAGNOSIS — I10 Essential (primary) hypertension: Secondary | ICD-10-CM

## 2023-04-22 DIAGNOSIS — E538 Deficiency of other specified B group vitamins: Secondary | ICD-10-CM

## 2023-04-22 DIAGNOSIS — D649 Anemia, unspecified: Secondary | ICD-10-CM | POA: Diagnosis not present

## 2023-04-22 MED ORDER — CYANOCOBALAMIN 1000 MCG/ML IJ SOLN
1000.0000 ug | Freq: Once | INTRAMUSCULAR | Status: AC
  Administered 2023-04-22: 1000 ug via INTRAMUSCULAR

## 2023-04-22 MED ORDER — TELMISARTAN-HCTZ 40-12.5 MG PO TABS: 1.0000 | ORAL_TABLET | Freq: Every day | ORAL | 0 refills | Status: DC

## 2023-04-22 MED ORDER — METOPROLOL SUCCINATE ER 50 MG PO TB24: 50.0000 mg | ORAL_TABLET | Freq: Every day | ORAL | 3 refills | Status: AC

## 2023-04-22 NOTE — Assessment & Plan Note (Signed)
UA obtained upon admission notable for leukocyte esterase and bacteriuria.  Empirically treated with Rocephin and transition to Keflex upon discharge.  She has completed antibiotics.  Remains asymptomatic.

## 2023-04-22 NOTE — Assessment & Plan Note (Signed)
Currently prescribed levothyroxine 100 mcg daily.  TSH significantly elevated at 64 when assessed last month. -Repeat TSH/T4 ordered today.  Will arrange PCP follow-up for 6 weeks

## 2023-04-22 NOTE — Patient Instructions (Signed)
It was a pleasure to see you today.  Thank you for giving [place] the opportunity to be involved in your care.  Below is a brief recap of your visit and next steps.  We will plan to see you again in 6 weeks.  [facility] follow up today Repeat labs Medications refilled B12 injection Follow up with Iliana in 6 weeks

## 2023-04-22 NOTE — Assessment & Plan Note (Signed)
Presenting today for hospital follow-up in the setting of recent admission for presyncope as documented above.  Cardiac workup during admission was reassuring.  She is currently wearing a Zio patch, which she will return today.  No recurrence of symptoms since hospital discharge. -Zio patch results pending -PCP follow-up in 6 weeks

## 2023-04-22 NOTE — Assessment & Plan Note (Signed)
Noted on recent labs.  IM vitamin B12 injection administered today per patient request.
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
## 2023-04-23 ENCOUNTER — Encounter: Payer: Self-pay | Admitting: Internal Medicine

## 2023-04-24 LAB — BASIC METABOLIC PANEL
BUN/Creatinine Ratio: 15 (ref 12–28)
BUN: 16 mg/dL (ref 8–27)
CO2: 21 mmol/L (ref 20–29)
Calcium: 10.8 mg/dL — ABNORMAL HIGH (ref 8.7–10.3)
Chloride: 103 mmol/L (ref 96–106)
Creatinine, Ser: 1.07 mg/dL — ABNORMAL HIGH (ref 0.57–1.00)
Glucose: 107 mg/dL — ABNORMAL HIGH (ref 70–99)
Potassium: 4.8 mmol/L (ref 3.5–5.2)
Sodium: 142 mmol/L (ref 134–144)
eGFR: 55 mL/min/{1.73_m2} — ABNORMAL LOW (ref >59)

## 2023-04-24 LAB — CBC WITH DIFFERENTIAL/PLATELET
Basophils Absolute: 0.1 10*3/uL (ref 0.0–0.2)
Basos: 1 %
EOS (ABSOLUTE): 0.4 10*3/uL (ref 0.0–0.4)
Eos: 5 %
Hematocrit: 35.1 % (ref 34.0–46.6)
Hemoglobin: 10.3 g/dL — ABNORMAL LOW (ref 11.1–15.9)
Immature Grans (Abs): 0 10*3/uL (ref 0.0–0.1)
Immature Granulocytes: 0 %
Lymphocytes Absolute: 1.6 10*3/uL (ref 0.7–3.1)
Lymphs: 19 %
MCH: 23.3 pg — ABNORMAL LOW (ref 26.6–33.0)
MCHC: 29.3 g/dL — ABNORMAL LOW (ref 31.5–35.7)
MCV: 79 fL (ref 79–97)
Monocytes Absolute: 0.5 10*3/uL (ref 0.1–0.9)
Monocytes: 6 %
Neutrophils Absolute: 5.8 10*3/uL (ref 1.4–7.0)
Neutrophils: 69 %
Platelets: 321 10*3/uL (ref 150–450)
RBC: 4.42 x10E6/uL (ref 3.77–5.28)
RDW: 17 % — ABNORMAL HIGH (ref 11.7–15.4)
WBC: 8.4 10*3/uL (ref 3.4–10.8)

## 2023-04-24 LAB — TSH+FREE T4
Free T4: 1.92 ng/dL — ABNORMAL HIGH (ref 0.82–1.77)
TSH: 0.086 u[IU]/mL — ABNORMAL LOW (ref 0.450–4.500)

## 2023-05-06 DIAGNOSIS — R55 Syncope and collapse: Secondary | ICD-10-CM | POA: Diagnosis not present

## 2023-05-07 DIAGNOSIS — I4719 Other supraventricular tachycardia: Secondary | ICD-10-CM | POA: Diagnosis not present

## 2023-05-31 ENCOUNTER — Other Ambulatory Visit: Payer: Self-pay | Admitting: Family Medicine

## 2023-06-03 ENCOUNTER — Ambulatory Visit: Payer: Medicare HMO | Admitting: Family Medicine

## 2023-07-01 ENCOUNTER — Other Ambulatory Visit: Payer: Self-pay | Admitting: Family Medicine

## 2023-07-01 DIAGNOSIS — F419 Anxiety disorder, unspecified: Secondary | ICD-10-CM

## 2023-07-17 ENCOUNTER — Other Ambulatory Visit: Payer: Self-pay | Admitting: Medical Genetics

## 2023-07-22 ENCOUNTER — Ambulatory Visit: Payer: Medicare HMO | Admitting: Family Medicine

## 2023-08-02 ENCOUNTER — Other Ambulatory Visit: Payer: Self-pay | Admitting: Family Medicine

## 2023-08-02 ENCOUNTER — Other Ambulatory Visit: Payer: Self-pay | Admitting: Internal Medicine

## 2023-08-02 DIAGNOSIS — I10 Essential (primary) hypertension: Secondary | ICD-10-CM

## 2023-08-02 DIAGNOSIS — F32A Depression, unspecified: Secondary | ICD-10-CM

## 2023-08-25 ENCOUNTER — Other Ambulatory Visit: Payer: Self-pay | Admitting: Family Medicine

## 2023-08-25 DIAGNOSIS — F32A Depression, unspecified: Secondary | ICD-10-CM

## 2023-09-08 ENCOUNTER — Ambulatory Visit: Payer: Medicare HMO | Admitting: Family Medicine

## 2023-09-09 ENCOUNTER — Ambulatory Visit: Admitting: Family Medicine

## 2023-09-15 ENCOUNTER — Encounter: Payer: Self-pay | Admitting: Family Medicine

## 2023-09-22 ENCOUNTER — Telehealth: Payer: Self-pay | Admitting: Family Medicine

## 2023-09-22 NOTE — Telephone Encounter (Signed)
 Called patient to schedule a diabetic eye exam voicemail not setup.

## 2023-09-29 ENCOUNTER — Other Ambulatory Visit: Payer: Self-pay | Admitting: Family Medicine

## 2023-09-29 DIAGNOSIS — F419 Anxiety disorder, unspecified: Secondary | ICD-10-CM

## 2023-10-15 ENCOUNTER — Ambulatory Visit: Payer: Self-pay | Admitting: Family Medicine

## 2023-10-25 ENCOUNTER — Other Ambulatory Visit: Payer: Self-pay | Admitting: Family Medicine

## 2023-10-25 DIAGNOSIS — F419 Anxiety disorder, unspecified: Secondary | ICD-10-CM

## 2023-10-25 DIAGNOSIS — I10 Essential (primary) hypertension: Secondary | ICD-10-CM

## 2023-11-24 ENCOUNTER — Other Ambulatory Visit: Payer: Self-pay | Admitting: Family Medicine

## 2023-11-24 DIAGNOSIS — F419 Anxiety disorder, unspecified: Secondary | ICD-10-CM

## 2023-11-26 ENCOUNTER — Ambulatory Visit: Admitting: Gastroenterology

## 2023-12-16 ENCOUNTER — Encounter: Payer: Self-pay | Admitting: Family Medicine

## 2023-12-16 ENCOUNTER — Ambulatory Visit (INDEPENDENT_AMBULATORY_CARE_PROVIDER_SITE_OTHER): Admitting: Family Medicine

## 2023-12-16 VITALS — BP 103/74 | HR 107 | Ht 62.5 in | Wt 226.0 lb

## 2023-12-16 DIAGNOSIS — Z23 Encounter for immunization: Secondary | ICD-10-CM | POA: Diagnosis not present

## 2023-12-16 DIAGNOSIS — I10 Essential (primary) hypertension: Secondary | ICD-10-CM | POA: Diagnosis not present

## 2023-12-16 DIAGNOSIS — Z1231 Encounter for screening mammogram for malignant neoplasm of breast: Secondary | ICD-10-CM | POA: Diagnosis not present

## 2023-12-16 DIAGNOSIS — E538 Deficiency of other specified B group vitamins: Secondary | ICD-10-CM

## 2023-12-16 DIAGNOSIS — N1831 Chronic kidney disease, stage 3a: Secondary | ICD-10-CM

## 2023-12-16 DIAGNOSIS — E038 Other specified hypothyroidism: Secondary | ICD-10-CM

## 2023-12-16 DIAGNOSIS — D509 Iron deficiency anemia, unspecified: Secondary | ICD-10-CM

## 2023-12-16 DIAGNOSIS — E1122 Type 2 diabetes mellitus with diabetic chronic kidney disease: Secondary | ICD-10-CM | POA: Diagnosis not present

## 2023-12-16 DIAGNOSIS — F419 Anxiety disorder, unspecified: Secondary | ICD-10-CM

## 2023-12-16 DIAGNOSIS — F32A Depression, unspecified: Secondary | ICD-10-CM

## 2023-12-16 DIAGNOSIS — E559 Vitamin D deficiency, unspecified: Secondary | ICD-10-CM | POA: Diagnosis not present

## 2023-12-16 DIAGNOSIS — E119 Type 2 diabetes mellitus without complications: Secondary | ICD-10-CM | POA: Diagnosis not present

## 2023-12-16 MED ORDER — SERTRALINE HCL 25 MG PO TABS
25.0000 mg | ORAL_TABLET | Freq: Every day | ORAL | 3 refills | Status: AC
Start: 2023-12-16 — End: ?

## 2023-12-16 MED ORDER — TRAZODONE HCL 150 MG PO TABS: 150.0000 mg | ORAL_TABLET | Freq: Every day | ORAL | 3 refills | Status: DC

## 2023-12-16 MED ORDER — TELMISARTAN 40 MG PO TABS: 40.0000 mg | ORAL_TABLET | Freq: Every day | ORAL | 2 refills | Status: DC

## 2023-12-16 NOTE — Progress Notes (Signed)
 Established Patient Office Visit   Subjective  Patient ID: Yesenia [name], female    DOB: [birth year]  Age: 73 y.o. MRN: [id number]  Chief Complaint  Patient presents with   Hypertension    Six month follow up   Dizziness    Dizziness with the feeling of going to pass out     She  has a past medical history of Anxiety, Carpal tunnel syndrome, Complication of anesthesia, DDD (degenerative disc disease), lumbar, DVT (deep venous thrombosis) (HCC) (10/17/2012), Essential hypertension, Facet arthritis of lumbar region, Fatty liver, Fibromyalgia, GERD (gastroesophageal reflux disease), Glaucoma, H/O hiatal hernia, Hypothyroidism, Hypothyroidism, Kidney stones, Osteoporosis, PE (pulmonary embolism) (10/17/2012), Pneumonia, S/P cardiac cath (8005,8002), Status post placement of implantable loop recorder, and Type 2 diabetes mellitus (HCC).  HPI Patient presents to the clinic for chronic follow up. For the details of today's visit, please refer to assessment and plan.   Review of Systems  Constitutional:  Negative for chills and fever.  Eyes:  Negative for blurred vision.  Respiratory:  Negative for shortness of breath.   Cardiovascular:  Positive for palpitations.  Gastrointestinal:  Negative for abdominal pain.  Genitourinary:  Negative for dysuria.  Neurological:  Positive for dizziness.      Objective:     BP 103/74   Pulse (!) 107   Ht 5' 2.5 (1.588 m)   Wt 226 lb (102.5 kg)   SpO2 97%   BMI 40.68 kg/m  BP Readings from Last 3 Encounters:  12/16/23 103/74  04/22/23 115/65  03/08/23 139/82      Physical Exam Vitals reviewed.  Constitutional:      General: She is not in acute distress.    Appearance: Normal appearance. She is not ill-appearing, toxic-appearing or diaphoretic.  HENT:     Head: Normocephalic.  Eyes:     General:        Right eye: No discharge.        Left eye: No discharge.     Conjunctiva/sclera: Conjunctivae normal.  Cardiovascular:     Rate  and Rhythm: Normal rate.     Pulses: Normal pulses.     Heart sounds: Normal heart sounds.  Pulmonary:     Effort: Pulmonary effort is normal. No respiratory distress.     Breath sounds: Normal breath sounds.  Abdominal:     General: Bowel sounds are normal.     Palpations: Abdomen is soft.     Tenderness: There is no abdominal tenderness. There is no right CVA tenderness, left CVA tenderness or guarding.  Skin:    General: Skin is warm and dry.     Capillary Refill: Capillary refill takes less than 2 seconds.  Neurological:     Mental Status: She is alert.  Psychiatric:        Mood and Affect: Mood normal.        Behavior: Behavior normal.      No results found for any visits on 12/16/23.  The 10-year ASCVD risk score (Arnett DK, et al., 2019) is: 18.4%    Assessment & Plan:  Primary hypertension -     Telmisartan ; Take 1 tablet (40 mg total) by mouth daily.  Dispense: 30 tablet; Refill: 2 -     CMP14+EGFR -     CBC with Differential/Platelet -     Lipid panel  Type 2 diabetes mellitus without complication, without long-term current use of insulin  (HCC) -     Microalbumin / creatinine urine ratio -  Ambulatory referral to Ophthalmology -     Hemoglobin A1c  Screening mammogram for breast cancer -     3D Screening Mammogram, Left and Right; Future  Iron deficiency anemia, unspecified iron deficiency anemia type -     Iron, TIBC and Ferritin Panel  Vitamin D  deficiency -     VITAMIN D  25 Hydroxy (Vit-D Deficiency, Fractures)  Vitamin B12 deficiency -     Vitamin B12  TSH (thyroid -stimulating hormone deficiency) -     TSH + free T4  Anxiety and depression -     Sertraline  HCl; Take 1 tablet (25 mg total) by mouth daily.  Dispense: 30 tablet; Refill: 3  Encounter for immunization -     Flu vaccine HIGH DOSE PF(Fluzone Trivalent)  Essential hypertension Assessment & Plan: D/C Telmisartan -hydrochlorothiazide  (Micardis  HCT) 40/12.5 mg tablet. Start:  Telmisartan  40 mg tablet - take one tablet once daily. Continue: Metoprolol  succinate 50 mg tablet - take one tablet once daily. Labs ordered Continued discussion on DASH diet, low sodium diet and maintain a exercise routine for 150 minutes per week.    Type 2 diabetes mellitus with stage 3a chronic kidney disease, without long-term current use of insulin  (HCC) Assessment & Plan: Last Hemoglobin A1c: 6.1 Labs: Ordered today, results pending; will follow up accordingly. The patient reports adhering to prescribed medications: Metformin  500 mg twice daily  Reviewed non-pharmacological interventions, including a balanced diet rich in lean proteins, healthy fats, whole grains, and high-fiber vegetables. Emphasized reducing refined sugars and processed carbohydrates, and incorporating more fruits, leafy greens, and legumes. Education: Patient was educated on recognizing signs and symptoms of both hypoglycemia and hyperglycemia, and advised to seek emergency care if these symptoms occur. Follow-Up: Scheduled for follow-up in 3-4 months, or sooner if needed. Patient Understanding: The patient verbalized understanding of the care plan, and all questions were answered. Additional Care: Ophthalmology referral was placed. Foot exam results were within normal limits.    Other orders -     traZODone  HCl; Take 1 tablet (150 mg total) by mouth at bedtime.  Dispense: 30 tablet; Refill: 3    Return in 4 months (on 04/16/2024), or if symptoms worsen or fail to improve, for chronic follow-up 4 weeks follow nurse visit recheck blood pressure, chronic follow-up.   Yesenia Kidd Wilhelmena Falter, FNP

## 2023-12-16 NOTE — Assessment & Plan Note (Signed)
 Last Hemoglobin A1c: 6.1 Labs: Ordered today, results pending; will follow up accordingly. The patient reports adhering to prescribed medications: Metformin 500 mg twice daily   Reviewed non-pharmacological interventions, including a balanced diet rich in lean proteins, healthy fats, whole grains, and high-fiber vegetables. Emphasized reducing refined sugars and processed carbohydrates, and incorporating more fruits, leafy greens, and legumes. Education: Patient was educated on recognizing signs and symptoms of both hypoglycemia and hyperglycemia, and advised to seek emergency care if these symptoms occur. Follow-Up: Scheduled for follow-up in 3-4 months, or sooner if needed. Patient Understanding: The patient verbalized understanding of the care plan, and all questions were answered. Additional Care: Ophthalmology referral was placed. Foot exam results were within normal limits.

## 2023-12-16 NOTE — Patient Instructions (Addendum)
        Great to see you today.  I have refilled the medication(s) we provide.   Blood pressure management: Stop taking telmisartan -hydrochlorothiazide  (MICARDIS  HCT) 40-12.5 MG tablet  and start telmisartan  40 mg once daily Continue metoprolol  succinate 50 mg once daily    Vitamins to take:  Iron 65 mg once daily Vit D 1000 IU once daily daily Vitamin B12 [phone] mcg once daily.   If labs were collected, we will inform you of lab results once received either by echart message or telephone call.   - echart message- for normal results that have been seen by the patient already.   - telephone call: abnormal results or if patient has not viewed results in their echart.   - Please take medications as prescribed. - Follow up with your primary health provider if any health concerns arises. - If symptoms worsen please contact your primary care provider and/or visit the emergency department.

## 2023-12-16 NOTE — Assessment & Plan Note (Signed)
 D/C Telmisartan -hydrochlorothiazide  (Micardis  HCT) 40/12.5 mg tablet. Start: Telmisartan  40 mg tablet - take one tablet once daily. Continue: Metoprolol  succinate 50 mg tablet - take one tablet once daily. Labs ordered Continued discussion on DASH diet, low sodium diet and maintain a exercise routine for 150 minutes per week.

## 2023-12-17 ENCOUNTER — Other Ambulatory Visit: Payer: Self-pay | Admitting: Family Medicine

## 2023-12-17 ENCOUNTER — Ambulatory Visit: Payer: Self-pay | Admitting: Family Medicine

## 2023-12-17 DIAGNOSIS — E538 Deficiency of other specified B group vitamins: Secondary | ICD-10-CM

## 2023-12-17 DIAGNOSIS — D509 Iron deficiency anemia, unspecified: Secondary | ICD-10-CM

## 2023-12-17 MED ORDER — LEVOTHYROXINE SODIUM 150 MCG PO TABS: 150.0000 ug | ORAL_TABLET | Freq: Every day | ORAL | 3 refills | Status: AC

## 2023-12-17 MED ORDER — SIMVASTATIN 10 MG PO TABS: 10.0000 mg | ORAL_TABLET | Freq: Every day | ORAL | 3 refills | Status: AC

## 2023-12-17 MED ORDER — CYANOCOBALAMIN 1000 MCG/ML IJ SOLN: 1000.0000 ug | Freq: Once | INTRAMUSCULAR | Status: AC

## 2023-12-17 NOTE — Progress Notes (Signed)
 Synthroid  medication:  Take it on an empty stomach: Take your Synthroid  first thing in the morning, at least 30-60 minutes before eating breakfast or drinking coffee. Food and certain drinks can affect how well your body absorbs the medication.  Use water  only: Swallow the pill with a full glass of water  to ensure it dissolves properly.  Be consistent with timing: Take Synthroid  at the same time every day to maintain steady hormone levels in your body.

## 2023-12-17 NOTE — Progress Notes (Signed)
 Please inform patient,  Hemoglobin A1c 5.8 Plan: Start Metformin  500 once daily instead of twice daily  Referral placed to hematology for iron deficiency anemia.  Vit  B12 levels low- patient can schedule a nurse visit for vit B12 injection- order is in   I advise to consume protein rich foods such as lean meats; poultry; eggs; seafood; beans, peas, and lentils; nuts and seeds; and soy products. Fish and red meat are excellent sources of vitamin B12. I encourage Vitamin B12 1000 mcg supplements over the counter tablets once daily.   TSH levels elevated I adjusted her synthroid  dose to 150 mcg once daily  Your cholesterol levels are elevated. I have started you on simvastatin  10 mg once daily. Please also limit saturated fats and processed foods to help improve your cholesterol level    Vitamin D  levels low, I advise to taking  over the counter supplements of vitamin D  1000 IU/day to prevent low vitamin D  levels.  Consume Vitamin D -Rich Foods: Fatty Fish: Salmon, mackerel, tuna, and sardines. Egg Yolks: A good source if eaten whole. Fortified Foods: Milk, orange juice, cereals, and plant-based milks (like almond or soy milk). Mushrooms: Especially those exposed to sunlight or UV light. Cod Liver Oil: A concentrated source of vitamin D . Including these foods in your diet can help boost vitamin D  levels   Symptoms of Low Vitamin D :  Fatigue and low energy. Bone pain and muscle weakness. Increased risk of fractures or weak bones. Frequent illness or infections. Mood changes, like depression or anxiety. Hair loss or thinning.     Your eGFR levels show that your kidney function is lower. Here are some important steps to take:  Control Your Blood Pressure: Aim to keep your blood pressure below 130/80. Continue taking your blood pressure medications daily.  Manage Your Diabetes: Focus on a healthy diet and regular exercise.  Keep Cholesterol in Check: This helps protect your  blood vessels from further damage.  Pain Management: Avoid NSAIDs (like ibuprofen) and use Tylenol  instead for pain relief.  Kidney-Friendly Diet:  Eat plenty of vegetables like cauliflower, onions, eggplant, and turnips. Choose low-sodium options and limit protein to lean meats (like poultry and fish), eggs, and unsalted seafood. Avoid fatty foods and limit smoking and alcohol. Stay Active: Aim for at least 150 minutes of exercise each week.  Please plan to follow up in 4 months for a lab recheck.

## 2023-12-18 LAB — IRON,TIBC AND FERRITIN PANEL
Ferritin: 12 ng/mL — ABNORMAL LOW (ref 15–150)
Iron Saturation: 8 % — CL (ref 15–55)
Iron: 31 ug/dL (ref 27–139)
Total Iron Binding Capacity: 394 ug/dL (ref 250–450)
UIBC: 363 ug/dL (ref 118–369)

## 2023-12-18 LAB — LIPID PANEL
Chol/HDL Ratio: 3.6 ratio (ref 0.0–4.4)
Cholesterol, Total: 195 mg/dL (ref 100–199)
HDL: 54 mg/dL (ref >39)
LDL Chol Calc (NIH): 119 mg/dL — ABNORMAL HIGH (ref 0–99)
Triglycerides: 123 mg/dL (ref 0–149)
VLDL Cholesterol Cal: 22 mg/dL (ref 5–40)

## 2023-12-18 LAB — TSH+FREE T4
Free T4: 1.08 ng/dL (ref 0.82–1.77)
TSH: 11.7 u[IU]/mL — ABNORMAL HIGH (ref 0.450–4.500)

## 2023-12-18 LAB — CBC WITH DIFFERENTIAL/PLATELET
Basophils Absolute: 0.1 x10E3/uL (ref 0.0–0.2)
Basos: 1 %
EOS (ABSOLUTE): 0.6 x10E3/uL — ABNORMAL HIGH (ref 0.0–0.4)
Eos: 5 %
Hematocrit: 33.9 % — ABNORMAL LOW (ref 34.0–46.6)
Hemoglobin: 9.8 g/dL — ABNORMAL LOW (ref 11.1–15.9)
Immature Grans (Abs): 0 x10E3/uL (ref 0.0–0.1)
Immature Granulocytes: 0 %
Lymphocytes Absolute: 2.3 x10E3/uL (ref 0.7–3.1)
Lymphs: 22 %
MCH: 23.6 pg — ABNORMAL LOW (ref 26.6–33.0)
MCHC: 28.9 g/dL — ABNORMAL LOW (ref 31.5–35.7)
MCV: 82 fL (ref 79–97)
Monocytes Absolute: 0.7 x10E3/uL (ref 0.1–0.9)
Monocytes: 7 %
Neutrophils Absolute: 6.9 x10E3/uL (ref 1.4–7.0)
Neutrophils: 65 %
Platelets: 449 x10E3/uL (ref 150–450)
RBC: 4.16 x10E6/uL (ref 3.77–5.28)
RDW: 14.6 % (ref 11.7–15.4)
WBC: 10.5 x10E3/uL (ref 3.4–10.8)

## 2023-12-18 LAB — CMP14+EGFR
ALT: 8 IU/L (ref 0–32)
AST: 13 IU/L (ref 0–40)
Albumin: 4.3 g/dL (ref 3.8–4.8)
Alkaline Phosphatase: 80 IU/L (ref 44–121)
BUN/Creatinine Ratio: 12 (ref 12–28)
BUN: 16 mg/dL (ref 8–27)
Bilirubin Total: 0.3 mg/dL (ref 0.0–1.2)
CO2: 19 mmol/L — ABNORMAL LOW (ref 20–29)
Calcium: 11 mg/dL — ABNORMAL HIGH (ref 8.7–10.3)
Chloride: 102 mmol/L (ref 96–106)
Creatinine, Ser: 1.3 mg/dL — ABNORMAL HIGH (ref 0.57–1.00)
Globulin, Total: 3.1 g/dL (ref 1.5–4.5)
Glucose: 104 mg/dL — ABNORMAL HIGH (ref 70–99)
Potassium: 4.4 mmol/L (ref 3.5–5.2)
Sodium: 136 mmol/L (ref 134–144)
Total Protein: 7.4 g/dL (ref 6.0–8.5)
eGFR: 44 mL/min/1.73 — ABNORMAL LOW (ref >59)

## 2023-12-18 LAB — VITAMIN D 25 HYDROXY (VIT D DEFICIENCY, FRACTURES): Vit D, 25-Hydroxy: 7.3 ng/mL — ABNORMAL LOW (ref 30.0–100.0)

## 2023-12-18 LAB — VITAMIN B12: Vitamin B-12: 160 pg/mL — ABNORMAL LOW (ref 232–1245)

## 2023-12-18 LAB — MICROALBUMIN / CREATININE URINE RATIO
Creatinine, Urine: 201.2 mg/dL
Microalb/Creat Ratio: 10 mg/g{creat} (ref 0–29)
Microalbumin, Urine: 19.4 ug/mL

## 2023-12-18 LAB — HEMOGLOBIN A1C
Est. average glucose Bld gHb Est-mCnc: 120 mg/dL
Hgb A1c MFr Bld: 5.8 % — ABNORMAL HIGH (ref 4.8–5.6)

## 2023-12-21 DIAGNOSIS — Z79899 Other long term (current) drug therapy: Secondary | ICD-10-CM | POA: Diagnosis not present

## 2023-12-21 DIAGNOSIS — N281 Cyst of kidney, acquired: Secondary | ICD-10-CM | POA: Diagnosis not present

## 2023-12-21 DIAGNOSIS — I10 Essential (primary) hypertension: Secondary | ICD-10-CM | POA: Diagnosis not present

## 2023-12-21 DIAGNOSIS — K449 Diaphragmatic hernia without obstruction or gangrene: Secondary | ICD-10-CM | POA: Diagnosis not present

## 2023-12-21 DIAGNOSIS — Z7984 Long term (current) use of oral hypoglycemic drugs: Secondary | ICD-10-CM | POA: Diagnosis not present

## 2023-12-21 DIAGNOSIS — K5730 Diverticulosis of large intestine without perforation or abscess without bleeding: Secondary | ICD-10-CM | POA: Diagnosis not present

## 2023-12-21 DIAGNOSIS — R1013 Epigastric pain: Secondary | ICD-10-CM | POA: Diagnosis not present

## 2023-12-21 DIAGNOSIS — I444 Left anterior fascicular block: Secondary | ICD-10-CM | POA: Diagnosis not present

## 2023-12-21 DIAGNOSIS — K219 Gastro-esophageal reflux disease without esophagitis: Secondary | ICD-10-CM | POA: Diagnosis not present

## 2023-12-21 DIAGNOSIS — K529 Noninfective gastroenteritis and colitis, unspecified: Secondary | ICD-10-CM | POA: Diagnosis not present

## 2023-12-21 DIAGNOSIS — I2510 Atherosclerotic heart disease of native coronary artery without angina pectoris: Secondary | ICD-10-CM | POA: Diagnosis not present

## 2023-12-21 DIAGNOSIS — R0789 Other chest pain: Secondary | ICD-10-CM | POA: Diagnosis not present

## 2023-12-21 DIAGNOSIS — E119 Type 2 diabetes mellitus without complications: Secondary | ICD-10-CM | POA: Diagnosis not present

## 2023-12-22 ENCOUNTER — Other Ambulatory Visit: Payer: Self-pay | Admitting: Family Medicine

## 2023-12-22 DIAGNOSIS — N281 Cyst of kidney, acquired: Secondary | ICD-10-CM | POA: Diagnosis not present

## 2023-12-22 DIAGNOSIS — K449 Diaphragmatic hernia without obstruction or gangrene: Secondary | ICD-10-CM | POA: Diagnosis not present

## 2023-12-22 DIAGNOSIS — K5730 Diverticulosis of large intestine without perforation or abscess without bleeding: Secondary | ICD-10-CM | POA: Diagnosis not present

## 2024-01-14 ENCOUNTER — Ambulatory Visit

## 2024-01-17 ENCOUNTER — Other Ambulatory Visit: Payer: Self-pay | Admitting: Family Medicine

## 2024-01-17 ENCOUNTER — Other Ambulatory Visit: Payer: Self-pay | Admitting: Gastroenterology

## 2024-01-26 ENCOUNTER — Ambulatory Visit

## 2024-01-28 ENCOUNTER — Ambulatory Visit

## 2024-02-03 ENCOUNTER — Other Ambulatory Visit: Payer: Self-pay | Admitting: Medical Genetics

## 2024-02-03 DIAGNOSIS — Z006 Encounter for examination for normal comparison and control in clinical research program: Secondary | ICD-10-CM

## 2024-02-13 ENCOUNTER — Other Ambulatory Visit: Payer: Self-pay | Admitting: Gastroenterology

## 2024-02-15 NOTE — Telephone Encounter (Signed)
 Please let patient and pharmacy know. We have denied refills several times now. Patient needs ov. She had appt in 11/2023 but canceled. She can get refill from her PCP until she is able to come in for an ov. We last saw patient 07/2022.

## 2024-02-15 NOTE — Telephone Encounter (Signed)
 Made pharmacy and pt aware.

## 2024-02-27 ENCOUNTER — Emergency Department (HOSPITAL_COMMUNITY)
Admission: EM | Admit: 2024-02-27 | Discharge: 2024-02-27 | Disposition: A | Discharge status: Home / Self Care | Attending: Emergency Medicine | Admitting: Emergency Medicine

## 2024-02-27 ENCOUNTER — Emergency Department (HOSPITAL_COMMUNITY)

## 2024-02-27 ENCOUNTER — Other Ambulatory Visit: Payer: Self-pay

## 2024-02-27 ENCOUNTER — Encounter (HOSPITAL_COMMUNITY): Payer: Self-pay

## 2024-02-27 DIAGNOSIS — E039 Hypothyroidism, unspecified: Secondary | ICD-10-CM | POA: Insufficient documentation

## 2024-02-27 DIAGNOSIS — W06XXXA Fall from bed, initial encounter: Secondary | ICD-10-CM | POA: Diagnosis not present

## 2024-02-27 DIAGNOSIS — I10 Essential (primary) hypertension: Secondary | ICD-10-CM | POA: Diagnosis not present

## 2024-02-27 DIAGNOSIS — E119 Type 2 diabetes mellitus without complications: Secondary | ICD-10-CM | POA: Diagnosis not present

## 2024-02-27 DIAGNOSIS — Z7984 Long term (current) use of oral hypoglycemic drugs: Secondary | ICD-10-CM | POA: Insufficient documentation

## 2024-02-27 DIAGNOSIS — R011 Cardiac murmur, unspecified: Secondary | ICD-10-CM | POA: Diagnosis not present

## 2024-02-27 DIAGNOSIS — S0083XA Contusion of other part of head, initial encounter: Secondary | ICD-10-CM | POA: Diagnosis not present

## 2024-02-27 DIAGNOSIS — S0990XA Unspecified injury of head, initial encounter: Secondary | ICD-10-CM

## 2024-02-27 DIAGNOSIS — Z79890 Hormone replacement therapy (postmenopausal): Secondary | ICD-10-CM | POA: Insufficient documentation

## 2024-02-27 DIAGNOSIS — S300XXA Contusion of lower back and pelvis, initial encounter: Secondary | ICD-10-CM | POA: Insufficient documentation

## 2024-02-27 LAB — URINALYSIS, ROUTINE W REFLEX MICROSCOPIC
Bacteria, UA: NONE SEEN
Bilirubin Urine: NEGATIVE
Glucose, UA: NEGATIVE mg/dL
Hgb urine dipstick: NEGATIVE
Ketones, ur: NEGATIVE mg/dL
Nitrite: NEGATIVE
Protein, ur: NEGATIVE mg/dL
Specific Gravity, Urine: 1.019 (ref 1.005–1.030)
pH: 5 (ref 5.0–8.0)

## 2024-02-27 LAB — CBC WITH DIFFERENTIAL/PLATELET
Abs Immature Granulocytes: 0.05 K/uL (ref 0.00–0.07)
Basophils Absolute: 0.1 K/uL (ref 0.0–0.1)
Basophils Relative: 1 %
Eosinophils Absolute: 0.2 K/uL (ref 0.0–0.5)
Eosinophils Relative: 1 %
HCT: 31.9 % — ABNORMAL LOW (ref 36.0–46.0)
Hemoglobin: 8.8 g/dL — ABNORMAL LOW (ref 12.0–15.0)
Immature Granulocytes: 0 %
Lymphocytes Relative: 11 %
Lymphs Abs: 1.3 K/uL (ref 0.7–4.0)
MCH: 21.7 pg — ABNORMAL LOW (ref 26.0–34.0)
MCHC: 27.6 g/dL — ABNORMAL LOW (ref 30.0–36.0)
MCV: 78.8 fL — ABNORMAL LOW (ref 80.0–100.0)
Monocytes Absolute: 0.7 K/uL (ref 0.1–1.0)
Monocytes Relative: 5 %
Neutro Abs: 10.4 K/uL — ABNORMAL HIGH (ref 1.7–7.7)
Neutrophils Relative %: 82 %
Platelets: 431 K/uL — ABNORMAL HIGH (ref 150–400)
RBC: 4.05 MIL/uL (ref 3.87–5.11)
RDW: 17.2 % — ABNORMAL HIGH (ref 11.5–15.5)
WBC: 12.7 K/uL — ABNORMAL HIGH (ref 4.0–10.5)
nRBC: 0 % (ref 0.0–0.2)

## 2024-02-27 LAB — COMPREHENSIVE METABOLIC PANEL WITH GFR
ALT: 7 U/L (ref 0–44)
AST: 26 U/L (ref 15–41)
Albumin: 4.3 g/dL (ref 3.5–5.0)
Alkaline Phosphatase: 77 U/L (ref 38–126)
Anion gap: 12 (ref 5–15)
BUN: 13 mg/dL (ref 8–23)
CO2: 24 mmol/L (ref 22–32)
Calcium: 10.9 mg/dL — ABNORMAL HIGH (ref 8.9–10.3)
Chloride: 101 mmol/L (ref 98–111)
Creatinine, Ser: 1.41 mg/dL — ABNORMAL HIGH (ref 0.44–1.00)
GFR, Estimated: 39 mL/min — ABNORMAL LOW (ref >60)
Glucose, Bld: 92 mg/dL (ref 70–99)
Potassium: 4.2 mmol/L (ref 3.5–5.1)
Sodium: 137 mmol/L (ref 135–145)
Total Bilirubin: 0.3 mg/dL (ref 0.0–1.2)
Total Protein: 7.6 g/dL (ref 6.5–8.1)

## 2024-02-27 MED ORDER — OXYCODONE-ACETAMINOPHEN 5-325 MG PO TABS
2.0000 | ORAL_TABLET | Freq: Once | ORAL | Status: AC
  Administered 2024-02-27: 2 via ORAL
  Filled 2024-02-27: qty 2

## 2024-02-27 MED ORDER — FENTANYL CITRATE (PF) 100 MCG/2ML IJ SOLN
50.0000 ug | Freq: Once | INTRAMUSCULAR | Status: AC
  Administered 2024-02-27: 50 ug via INTRAVENOUS
  Filled 2024-02-27: qty 2

## 2024-02-27 NOTE — ED Notes (Signed)
 After multiple nurses and multiple attempts, IV and labs now obtained.

## 2024-02-27 NOTE — ED Provider Notes (Signed)
 [place] EMERGENCY DEPARTMENT AT [facility] Provider Note   CSN: [id number] Arrival date & time: 02/27/24  9348     Patient presents with: Loss of Consciousness   Yesenia [name] is a 73 y.o. female.    Loss of Consciousness  This patient is an obese 73 year old female with a history of diabetes, history of hypothyroidism, history of hypertension, she presents to the hospital today after having a fall out of bed this morning, she reports that she woke up as she was falling off of the bed feeling like she was mid air she then fell to the ground which was a hard wooden floor and struck her head on the ground.  She thinks that she probably had a loss of consciousness but she is not sure, she was able to get herself to her feet and get to the bathroom where she sat on the commode and then felt like she might of fallen again but she does not remember.  She does recall yesterday feeling poorly after an interaction with a family member where she had increasing stress but there was no medical complaints including fevers chills nausea vomiting diarrhea chest pain shortness of breath headaches blurry vision or any other complaints.  She feels like her vision is a little bit blurry now after having the head injury this morning.  She arrives by paramedic transport with unremarkable vital signs.    Prior to Admission medications   Medication Sig Start Date End Date Taking? Authorizing Provider  Accu-Chek Softclix Lancets lancets test blood sugar 3 times daily morning, noon and bedtime 09/30/23   Del Wilhelmena Falter, [place], FNP  Blood Glucose Monitoring Suppl (BLOOD GLUCOSE SYSTEM PAK) KIT Please dispense as Accu Chek Guide. USE TO TEST BLOOD SUGAR 3 TIMES DAILY. Dx: E11.65. 01/23/20   Bari Theodoro JULIANNA, MD  Blood Glucose Monitoring Suppl DEVI 1 each by Does not apply route in the morning, at noon, and at bedtime. May substitute to any manufacturer covered by patient's insurance. 06/09/22   Del  Wilhelmena Falter Sola, FNP  gabapentin  (NEURONTIN ) 300 MG capsule Take 1 capsule (300 mg total) by mouth at bedtime as needed. 03/08/23   Del Wilhelmena Falter Sola, FNP  glucose blood (ACCU-CHEK GUIDE TEST) test strip test blood sugar 3 times daily, morning, noon and bedtime 09/30/23   Del Wilhelmena Falter, [place], FNP  Glucose Blood (BLOOD GLUCOSE TEST STRIPS) STRP 1 each by In Vitro route in the morning, at noon, and at bedtime. May substitute to any manufacturer covered by patient's insurance. 06/09/22 05/31/24  Del Wilhelmena Falter Sola, FNP  Lancet Devices MISC Please dispense based on patient and insurance preference. Monitor FSBS 3x daily. Dx: E11.65 09/13/17   Bari Theodoro JULIANNA, MD  levothyroxine  (SYNTHROID ) 150 MCG tablet Take 1 tablet (150 mcg total) by mouth daily. 12/17/23   Del Orbe Polanco, Iliana, FNP  linaclotide  (LINZESS ) 290 MCG CAPS capsule TAKE 1 CAPSULE ONCE DAILY BEFORE BREAKFAST Patient taking differently: Take 290 mcg by mouth daily before breakfast. 02/18/22   Shirlean Therisa ORN, NP  metFORMIN  (GLUCOPHAGE ) 500 MG tablet take 1 tablet (500 milligram total) by mouth 2 (two) times daily with a meal. 12/22/23   Del Wilhelmena Falter, Sola, FNP  metoprolol  succinate (TOPROL -XL) 50 MG 24 hr tablet Take 1 tablet (50 mg total) by mouth daily. Take with or immediately following a meal. 04/22/23   Melvenia Manus BRAVO, MD  nitroGLYCERIN  (NITROSTAT ) 0.4 MG SL tablet place 1 tablet (0.4 milligram total)  under the tongue every 5 (five) minutes as needed for chest pain. 01/18/24   Del Wilhelmena Lloyd Sola, FNP  sertraline  (ZOLOFT ) 25 MG tablet Take 1 tablet (25 mg total) by mouth daily. 12/16/23   Del Orbe Polanco, Iliana, FNP  simvastatin  (ZOCOR ) 10 MG tablet Take 1 tablet (10 mg total) by mouth at bedtime. 12/17/23   Del Orbe Polanco, Iliana, FNP  telmisartan  (MICARDIS ) 40 MG tablet Take 1 tablet (40 mg total) by mouth daily. 12/16/23   Del Orbe Polanco, Iliana, FNP  traZODone  (DESYREL ) 150 MG tablet Take 1 tablet (150  mg total) by mouth at bedtime. 12/16/23   Del Orbe Polanco, Iliana, FNP  vitamin B-12 (CYANOCOBALAMIN ) 100 MCG tablet Take 100 mcg by mouth daily.    [provider]    Allergies: Aspirin  and Nalbuphine    Review of Systems  Cardiovascular:  Positive for syncope.  All other systems reviewed and are negative.   Updated Vital Signs BP 118/66   Pulse 69   Temp 98.4 F (36.9 C) (Oral)   Resp 14   Ht 1.575 m (5' 2)   Wt 101.2 kg   SpO2 93%   BMI 40.79 kg/m   Physical Exam Vitals and nursing note reviewed.  Constitutional:      General: She is not in acute distress.    Appearance: She is well-developed.  HENT:     Head: Normocephalic.     Comments: Small hematoma to the forehead    Mouth/Throat:     Pharynx: No oropharyngeal exudate.  Eyes:     General: No scleral icterus.       Right eye: No discharge.        Left eye: No discharge.     Conjunctiva/sclera: Conjunctivae normal.     Pupils: Pupils are equal, round, and reactive to light.  Neck:     Thyroid : No thyromegaly.     Vascular: No JVD.  Cardiovascular:     Rate and Rhythm: Normal rate and regular rhythm.     Heart sounds: Murmur heard.     No friction rub. No gallop.  Pulmonary:     Effort: Pulmonary effort is normal. No respiratory distress.     Breath sounds: Normal breath sounds. No wheezing or rales.  Abdominal:     General: Bowel sounds are normal. There is no distension.     Palpations: Abdomen is soft. There is no mass.     Tenderness: There is no abdominal tenderness.  Musculoskeletal:        General: No tenderness. Normal range of motion.     Cervical back: Normal range of motion and neck supple.  Lymphadenopathy:     Cervical: No cervical adenopathy.  Skin:    General: Skin is warm and dry.     Findings: No erythema or rash.  Neurological:     Mental Status: She is alert.     Coordination: Coordination normal.     Comments: Speech is clear, cranial nerves III through XII are intact,  memory is intact, strength is normal in all 4 extremities including grips and strength at the bilateral thighs, knees and ankles to extention and flexion, sensation is intact to light touch and pinprick in all 4 extremities. Coordination as tested by finger-nose-finger is normal, no limb ataxia. Normal gait, normal reflexes at the patellar tendons bilaterally  Psychiatric:        Behavior: Behavior normal.     (all labs ordered are listed, but only abnormal  results are displayed) Labs Reviewed  URINALYSIS, ROUTINE W REFLEX MICROSCOPIC - Abnormal; Notable for the following components:      Result Value   APPearance HAZY (*)    Leukocytes,Ua MODERATE (*)    All other components within normal limits  CBC WITH DIFFERENTIAL/PLATELET - Abnormal; Notable for the following components:   WBC 12.7 (*)    Hemoglobin 8.8 (*)    HCT 31.9 (*)    MCV 78.8 (*)    MCH 21.7 (*)    MCHC 27.6 (*)    RDW 17.2 (*)    Platelets 431 (*)    Neutro Abs 10.4 (*)    All other components within normal limits  COMPREHENSIVE METABOLIC PANEL WITH GFR - Abnormal; Notable for the following components:   Creatinine, Ser 1.41 (*)    Calcium  10.9 (*)    GFR, Estimated 39 (*)    All other components within normal limits    EKG: EKG Interpretation Date/Time:  Sunday February 27 2024 07:06:56 EST Ventricular Rate:  74 PR Interval:  209 QRS Duration:  103 QT Interval:  393 QTC Calculation: 436 R Axis:   -64  Text Interpretation: Sinus rhythm LAD, consider LAFB or inferior infarct Probable lateral infarct, old Confirmed by Cleotilde Rogue ([id number]) on 02/27/2024 7:15:17 AM  Radiology: [facility] Lumbar Spine Complete Result Date: 02/27/2024 EXAM: 4 VIEW(S) XRAY OF THE LUMBAR SPINE 02/27/2024 07:54:00 AM COMPARISON: Thoracic radiographs 02/27/2024. CT chest, abdomen, and pelvis 10/23/2018. CLINICAL HISTORY: 73 year old female. Trauma. FINDINGS: 13 pairs of ribs confirmed on the comparisons. For the purposes of this report,  the lowest roots will be designated L1. S1 is lumbarized. LUMBAR SPINE: BONES: Maintained lumbar lordosis. Spinal hardware appears intact. Chronic L5-S1 posterior and interbody fusion. Mild chronic wedging of T12 appears stable since 2020. Mild L3 superior endplate compression and/or Schmorl node also stable. Chronic osteopenia. Chronic fracture of the ventral sacrum at S1-S2 appears stable. No acute osseous abnormality. DISCS AND DEGENERATIVE CHANGES: Maintained disc spaces. No severe degenerative changes. SOFT TISSUES: No acute abnormality. Chronic cholecystectomy. Diverticulosis of the distal large bowel. Nonobstructed bowel gas pattern. IMPRESSION: 1. Transitional anatomy with 13 rib pairs, lumbarized S1 level. Correlation with radiographs is recommended prior to any operative intervention. 2. Osteopenia. Stable chronic T12 and L3 compression. Stable chronic L5-S1 fusion. Stable chronic S1-S2 sacral fracture. 3. No acute osseous abnormality identified in the lumbar spine. Electronically signed by: Helayne Hurst MD 02/27/2024 08:05 AM EST RP Workstation: [id number]   DG Thoracic Spine 2 View Result Date: 02/27/2024 EXAM: 2 VIEW(S) XRAY OF THE THORACIC SPINE 02/27/2024 07:54:00 AM COMPARISON: Cervical spine CT today reported separately. Comparison CTA chest 10/23/2018. CLINICAL HISTORY: 73 year old female trauma. FINDINGS: BONES: 13 pairs of ribs. Transitional anatomy, confirmed on prior CT. Chronic osteopenia. Thoracic kyphosis. Thoracic vertebral height and alignment appears stable since 2020. No acute fracture. No aggressive appearing osseous lesion. Alignment is normal. Partially visible humerus. SOFT TISSUES: The visualized lungs are clear. Negative visible mediastinum. IMPRESSION: 1. Transitional anatomy with 13 rib pairs, confirmed on prior CT. 2. Osteopenia. No acute osseous abnormality identified in the thoracic spine. Electronically signed by: Helayne Hurst MD 02/27/2024 08:01 AM EST RP Workstation:  [id number]   CT Head Wo Contrast Result Date: 02/27/2024 EXAM: CT HEAD AND CERVICAL SPINE 02/27/2024 07:43:47 AM TECHNIQUE: CT of the head and cervical spine was performed without the administration of intravenous contrast. Multiplanar reformatted images are provided for review. Automated exposure control, iterative reconstruction, and/or weight based adjustment of the  mA/kV was utilized to reduce the radiation dose to as low as reasonably achievable. COMPARISON: None available. CLINICAL HISTORY: Facial trauma, blunt. FINDINGS: CT HEAD BRAIN AND VENTRICLES: Prominence of the sulci and ventricles compatible with brain atrophy. No acute intracranial hemorrhage. No mass effect or midline shift. No abnormal extra-axial fluid collection. No evidence of acute infarct. No hydrocephalus. ORBITS: No acute abnormality. SINUSES AND MASTOIDS: No acute abnormality. SOFT TISSUES AND SKULL: No acute skull fracture. No acute soft tissue abnormality. CT CERVICAL SPINE BONES AND ALIGNMENT: No acute fracture or traumatic malalignment. DEGENERATIVE CHANGES: Disc space narrowing and endplate spurring identified at C5-C6 and C6-C7. SOFT TISSUES: No prevertebral soft tissue swelling. IMPRESSION: 1. No acute intracranial abnormality. 2. No acute fracture or traumatic malalignment of the cervical spine. Electronically signed by: Waddell Calk MD 02/27/2024 07:51 AM EST RP Workstation: [id number]   CT Cervical Spine Wo Contrast Result Date: 02/27/2024 EXAM: CT HEAD AND CERVICAL SPINE 02/27/2024 07:43:47 AM TECHNIQUE: CT of the head and cervical spine was performed without the administration of intravenous contrast. Multiplanar reformatted images are provided for review. Automated exposure control, iterative reconstruction, and/or weight based adjustment of the mA/kV was utilized to reduce the radiation dose to as low as reasonably achievable. COMPARISON: None available. CLINICAL HISTORY: Facial trauma, blunt. FINDINGS: CT HEAD BRAIN  AND VENTRICLES: Prominence of the sulci and ventricles compatible with brain atrophy. No acute intracranial hemorrhage. No mass effect or midline shift. No abnormal extra-axial fluid collection. No evidence of acute infarct. No hydrocephalus. ORBITS: No acute abnormality. SINUSES AND MASTOIDS: No acute abnormality. SOFT TISSUES AND SKULL: No acute skull fracture. No acute soft tissue abnormality. CT CERVICAL SPINE BONES AND ALIGNMENT: No acute fracture or traumatic malalignment. DEGENERATIVE CHANGES: Disc space narrowing and endplate spurring identified at C5-C6 and C6-C7. SOFT TISSUES: No prevertebral soft tissue swelling. IMPRESSION: 1. No acute intracranial abnormality. 2. No acute fracture or traumatic malalignment of the cervical spine. Electronically signed by: Waddell Calk MD 02/27/2024 07:51 AM EST RP Workstation: [id number]     Procedures   Medications Ordered in the ED  oxyCODONE -acetaminophen  (PERCOCET/ROXICET) 5-325 MG per tablet 2 tablet (has no administration in time range)  fentaNYL  (SUBLIMAZE ) injection 50 mcg (50 mcg Intravenous Given 02/27/24 0907)    Clinical Course as of 02/27/24 1037  Sun Feb 27, 2024  0822  Radiology Imaging: I personally viewed the images of the ordered radiographic studies and find no findings of fracture or dislocation of the skull the cervical spine the thoracic spine or the lumbar spine, no intracranial hemorrhage I agree with the radiologist interpretation as well  [BM]    Clinical Course User Index [BM] Cleotilde Rogue, MD                                 Medical Decision Making Amount and/or Complexity of Data Reviewed Labs: ordered. Radiology: ordered. ECG/medicine tests: ordered.  Risk Prescription drug management.    This patient presents to the ED for concern of head injury after a fall out of bed differential diagnosis includes intracranial hematoma, thankfully the patient is not anticoagulated but does have some visual changes this  morning that she says are pretty vague but slightly blurry.  No gross visual acuity changes, I do not think this was a syncopal event is much as it was a head injury with subsequent mild loss of consciousness.  She has no symptoms at this time suggest a cardiac cause  and she definitely recalls rolling out of bed and falling to the ground    Additional history obtained   Additional history obtained from Electronic Medical Record External records from outside source obtained and reviewed including medical record and paramedic report, patient has been evaluated in the office for hypertension most recently seen in September, no recent admissions to the hospital since she had a syncopal event in December 2024 when she was admitted to the hospital for short time admission, at that time she had an acute kidney injury and a UTI   Lab Tests:  I Ordered, and personally interpreted labs.  The pertinent results include: CBC and metabolic panel unremarkable, the patient does have a mild anemia with a hemoglobin of 8.8 fairly consistent with prior values.  Metabolic panel and urinalysis also reviewed without significant findings, no UTI, kidney function at baseline compared to labs from 2 months ago,   Imaging Studies ordered:  I ordered imaging studies including thoracic and lumbar spines as well as CT head and cervical spine I independently visualized and interpreted imaging which showed no findings of acute fracture or intracranial injury I agree with the radiologist interpretation   Medicines ordered and prescription drug management:  I ordered medication including pain medication including Percocet with some improvement    I have reviewed the patients home medicines and have made adjustments as needed   Problem List / ED Course:  Patient updated on results, no acute findings to suggest need for admission, no arrhythmias seen on the monitoring   Social Determinants of Health:  Elderly,  obese  I have discussed with the patient at the bedside the results, and the meaning of these results.  They have had opportunity to ask questions,  expressed their understanding to the need for follow-up with primary care physician        Final diagnoses:  Fall from bed, initial encounter  Minor head injury, initial encounter  Lumbar contusion, initial encounter    ED Discharge Orders     None          Cleotilde Rogue, MD 02/27/24 1037

## 2024-02-27 NOTE — Discharge Instructions (Signed)
 Your testing did not show any signs of broken bones or injury to your brain, you have had a mild concussion, you should take Tylenol  or ibuprofen, follow-up with your family doctor, there is nothing else that needs to be done today except for resting and hydration.

## 2024-02-27 NOTE — ED Triage Notes (Signed)
 Pt from home with son. She has fallen 3 times in the past 24 hours. This last time she got out of bed to go to the bathroom and doesn't remember what happen. She was on the toilet on moment and on the floor the next. She laid in the floor until EMS arrived. (15-20 mins). Pt has notable abrasion to R side of forehead.

## 2024-02-27 NOTE — ED Notes (Signed)
 Unable to locate a suitable IV site. Requested assistance from second RN.

## 2024-02-27 NOTE — ED Notes (Signed)
 Pt difficult stick. 2 unsuccessful IV attempts.

## 2024-02-28 ENCOUNTER — Inpatient Hospital Stay: Admitting: Hematology and Oncology

## 2024-02-28 ENCOUNTER — Inpatient Hospital Stay

## 2024-03-06 ENCOUNTER — Ambulatory Visit: Payer: Self-pay

## 2024-03-06 NOTE — Telephone Encounter (Signed)
 Copied from CRM #[id number]. Topic: Clinical - Red Word Triage >> Mar 06, 2024 10:02 AM Jessilynn B wrote: [facility] that prompted transfer to Nurse Triage: patient has severe pain from left side to upper part of back
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
## 2024-03-06 NOTE — Telephone Encounter (Signed)
 FYI Only or Action Required?: Action required by provider: request for appointment and requesting pain medication. Was prescribed from the ER.  Patient was last seen in primary care on 12/16/2023 by Terry Wilhelmena Lloyd Hilario, FNP.  Called Nurse Triage reporting Rib Injury.  Symptoms began a week ago.  Interventions attempted: OTC medications: tylenol , excedrin and Prescription medications: hydrocodone .  Symptoms are: gradually worsening.  Triage Disposition: See HCP Within 4 Hours (Or PCP Triage)  Patient/caregiver understands and will follow disposition?: Yes

## 2024-03-07 ENCOUNTER — Ambulatory Visit: Payer: Self-pay

## 2024-03-07 NOTE — Telephone Encounter (Signed)
 FYI Only or Action Required?: FYI only for provider: ED advised.  Patient was last seen in primary care on 12/16/2023 by Terry Wilhelmena Lloyd Hilario, FNP.  Called Nurse Triage reporting Pain.  Symptoms began yesterday.  Interventions attempted: Nothing.  Symptoms are: unchanged.  Triage Disposition: Go to ED Now (Notify PCP)  Patient/caregiver understands and will follow disposition?: Yes    Copied from CRM [id number]. Topic: Appointments - Appointment Scheduling >> Mar 07, 2024  1:36 PM Tonda B wrote: Patient/patient representative is calling to schedule an appointment. Refer to attachments for appointment information.  Pt is calling she has been to the hospital 10 days ago patient fell and fractured her right rib patient is in excruciating pain Reason for Disposition  [1] Pain lasting > 5 minutes AND [2] pain also present in chest  (Exception: Pain is clearly made worse by movement.)  Answer Assessment - Initial Assessment Questions 1. ONSET: When did the pain start?     Yesterday around 2:30 pm  2. LOCATION: Where is the pain located?     Right shoulder and left rib 3. PAIN: How bad is the pain? (Scale 1-10; or mild, moderate, severe)     8 to 9/10 4. WORK OR EXERCISE: Has there been any recent work or exercise that involved this part of the body?     no 5. CAUSE: What do you think is causing the shoulder pain?     unknown 6. OTHER SYMPTOMS: Do you have any other symptoms? (e.g., neck pain, swelling, rash, fever, numbness, weakness)     Nausea, dizzy,  7. PREGNANCY: Is there any chance you are pregnant? When was your last menstrual? No  Pt stated having 2 falls recently: one fall ended up with pt having fractures to left rib area.  Pt states at present time left rib area 8/10 pain and since yesterday also having 8/10 right shoulder pain: urged pt to go be seen at ED: pt stated she went yesterday and they almost admitted her but told her they did not want to admit  b/c she could get sicker in the hospital.  Nurse informed pt see needs to be seen by someone today right away regarding her s/s: encouraged pt to got to nearest ED or urgent care to be evaluated: pt stated her sone lives with her and she will tell him now and go.  Protocols used: Shoulder Pain-A-AH

## 2024-03-07 NOTE — Telephone Encounter (Signed)
 Noted patient advised ED

## 2024-03-08 ENCOUNTER — Emergency Department (HOSPITAL_COMMUNITY)

## 2024-03-08 ENCOUNTER — Other Ambulatory Visit: Payer: Self-pay

## 2024-03-08 ENCOUNTER — Emergency Department (HOSPITAL_COMMUNITY)
Admission: EM | Admit: 2024-03-08 | Discharge: 2024-03-08 | Disposition: A | Discharge status: Home / Self Care | Attending: Emergency Medicine | Admitting: Emergency Medicine

## 2024-03-08 ENCOUNTER — Encounter (HOSPITAL_COMMUNITY): Payer: Self-pay

## 2024-03-08 DIAGNOSIS — W19XXXA Unspecified fall, initial encounter: Secondary | ICD-10-CM | POA: Insufficient documentation

## 2024-03-08 DIAGNOSIS — Z7984 Long term (current) use of oral hypoglycemic drugs: Secondary | ICD-10-CM | POA: Diagnosis not present

## 2024-03-08 DIAGNOSIS — S40011A Contusion of right shoulder, initial encounter: Secondary | ICD-10-CM | POA: Insufficient documentation

## 2024-03-08 DIAGNOSIS — D649 Anemia, unspecified: Secondary | ICD-10-CM | POA: Diagnosis not present

## 2024-03-08 DIAGNOSIS — S4991XA Unspecified injury of right shoulder and upper arm, initial encounter: Secondary | ICD-10-CM | POA: Diagnosis present

## 2024-03-08 DIAGNOSIS — E119 Type 2 diabetes mellitus without complications: Secondary | ICD-10-CM | POA: Insufficient documentation

## 2024-03-08 DIAGNOSIS — M25511 Pain in right shoulder: Secondary | ICD-10-CM

## 2024-03-08 LAB — CBC WITH DIFFERENTIAL/PLATELET
Abs Immature Granulocytes: 0.03 K/uL (ref 0.00–0.07)
Basophils Absolute: 0.1 K/uL (ref 0.0–0.1)
Basophils Relative: 1 %
Eosinophils Absolute: 0.3 K/uL (ref 0.0–0.5)
Eosinophils Relative: 4 %
HCT: 31.6 % — ABNORMAL LOW (ref 36.0–46.0)
Hemoglobin: 8.6 g/dL — ABNORMAL LOW (ref 12.0–15.0)
Immature Granulocytes: 0 %
Lymphocytes Relative: 15 %
Lymphs Abs: 1.4 K/uL (ref 0.7–4.0)
MCH: 21.3 pg — ABNORMAL LOW (ref 26.0–34.0)
MCHC: 27.2 g/dL — ABNORMAL LOW (ref 30.0–36.0)
MCV: 78.2 fL — ABNORMAL LOW (ref 80.0–100.0)
Monocytes Absolute: 0.8 K/uL (ref 0.1–1.0)
Monocytes Relative: 9 %
Neutro Abs: 6.8 K/uL (ref 1.7–7.7)
Neutrophils Relative %: 71 %
Platelets: 412 K/uL — ABNORMAL HIGH (ref 150–400)
RBC: 4.04 MIL/uL (ref 3.87–5.11)
RDW: 17.4 % — ABNORMAL HIGH (ref 11.5–15.5)
WBC: 9.4 K/uL (ref 4.0–10.5)
nRBC: 0 % (ref 0.0–0.2)

## 2024-03-08 LAB — BASIC METABOLIC PANEL WITH GFR
Anion gap: 16 — ABNORMAL HIGH (ref 5–15)
BUN: 12 mg/dL (ref 8–23)
CO2: 18 mmol/L — ABNORMAL LOW (ref 22–32)
Calcium: 11 mg/dL — ABNORMAL HIGH (ref 8.9–10.3)
Chloride: 102 mmol/L (ref 98–111)
Creatinine, Ser: 1.56 mg/dL — ABNORMAL HIGH (ref 0.44–1.00)
GFR, Estimated: 35 mL/min — ABNORMAL LOW (ref >60)
Glucose, Bld: 116 mg/dL — ABNORMAL HIGH (ref 70–99)
Potassium: 3.4 mmol/L — ABNORMAL LOW (ref 3.5–5.1)
Sodium: 135 mmol/L (ref 135–145)

## 2024-03-08 MED ORDER — OXYCODONE-ACETAMINOPHEN 5-325 MG PO TABS: ORAL_TABLET | ORAL | 0 refills | Status: AC

## 2024-03-08 NOTE — ED Triage Notes (Signed)
 Pt arrived via POV form home c/o right shoulder pain since last Sunday. Pt reports sneezing really hard and felt something happen in her shoulder. Pt also reports falling a lot recently and being evaluated at [facility] and was advised of a rib fracture and being dehydrated.

## 2024-03-08 NOTE — ED Notes (Signed)
Pt ambulatory to bathroom and back to room 

## 2024-03-08 NOTE — Discharge Instructions (Signed)
 Follow-up with your doctor next week for your shoulder pain and your elevated calcium .  Drink plenty of fluids

## 2024-03-10 NOTE — ED Provider Notes (Signed)
 [place] EMERGENCY DEPARTMENT AT [facility] Provider Note   CSN: [id number] Arrival date & time: 03/08/24  1540     Patient presents with: Shoulder Pain (/)   Yesenia [name] is a 73 y.o. female.   Patient complains of right shoulder pain after sneezing and falling.  Patient had no loss of consciousness.  Patient has a history of diabetes  The history is provided by the patient. No language interpreter was used.  Shoulder Pain Location:  Shoulder Shoulder location:  R shoulder Prior injury to area:  Yes Relieved by:  Nothing Worsened by:  Movement Ineffective treatments:  None tried Associated symptoms: no back pain and no fatigue   Risk factors: no known bone disorder        Prior to Admission medications   Medication Sig Start Date End Date Taking? Authorizing Provider  oxyCODONE -acetaminophen  (PERCOCET/ROXICET) 5-325 MG tablet Take 1 every 6 hours for pain not relieved by Tylenol  or Motrin alone. 03/08/24  Yes Alechia Lezama, MD  Accu-Chek Softclix Lancets lancets test blood sugar 3 times daily morning, noon and bedtime 09/30/23   Del Wilhelmena Falter, [place], FNP  Blood Glucose Monitoring Suppl (BLOOD GLUCOSE SYSTEM PAK) KIT Please dispense as Accu Chek Guide. USE TO TEST BLOOD SUGAR 3 TIMES DAILY. Dx: E11.65. 01/23/20   Bari Theodoro JULIANNA, MD  Blood Glucose Monitoring Suppl DEVI 1 each by Does not apply route in the morning, at noon, and at bedtime. May substitute to any manufacturer covered by patient's insurance. 06/09/22   Del Wilhelmena Falter Sola, FNP  gabapentin  (NEURONTIN ) 300 MG capsule Take 1 capsule (300 mg total) by mouth at bedtime as needed. 03/08/23   Del Wilhelmena Falter Sola, FNP  glucose blood (ACCU-CHEK GUIDE TEST) test strip test blood sugar 3 times daily, morning, noon and bedtime 09/30/23   Del Wilhelmena Falter, [place], FNP  Glucose Blood (BLOOD GLUCOSE TEST STRIPS) STRP 1 each by In Vitro route in the morning, at noon, and at bedtime. May substitute to any  manufacturer covered by patient's insurance. 06/09/22 05/31/24  Del Wilhelmena Falter Sola, FNP  Lancet Devices MISC Please dispense based on patient and insurance preference. Monitor FSBS 3x daily. Dx: E11.65 09/13/17   Bari Theodoro JULIANNA, MD  levothyroxine  (SYNTHROID ) 150 MCG tablet Take 1 tablet (150 mcg total) by mouth daily. 12/17/23   Del Orbe Polanco, Iliana, FNP  linaclotide  (LINZESS ) 290 MCG CAPS capsule TAKE 1 CAPSULE ONCE DAILY BEFORE BREAKFAST Patient taking differently: Take 290 mcg by mouth daily before breakfast. 02/18/22   Shirlean Therisa ORN, NP  metFORMIN  (GLUCOPHAGE ) 500 MG tablet take 1 tablet (500 milligram total) by mouth 2 (two) times daily with a meal. 12/22/23   Del Wilhelmena Falter, Sola, FNP  metoprolol  succinate (TOPROL -XL) 50 MG 24 hr tablet Take 1 tablet (50 mg total) by mouth daily. Take with or immediately following a meal. 04/22/23   Melvenia Manus BRAVO, MD  nitroGLYCERIN  (NITROSTAT ) 0.4 MG SL tablet place 1 tablet (0.4 milligram total) under the tongue every 5 (five) minutes as needed for chest pain. 01/18/24   Del Wilhelmena Falter Sola, FNP  sertraline  (ZOLOFT ) 25 MG tablet Take 1 tablet (25 mg total) by mouth daily. 12/16/23   Del Orbe Polanco, Iliana, FNP  simvastatin  (ZOCOR ) 10 MG tablet Take 1 tablet (10 mg total) by mouth at bedtime. 12/17/23   Del Orbe Polanco, Iliana, FNP  telmisartan  (MICARDIS ) 40 MG tablet Take 1 tablet (40 mg total) by mouth daily. 12/16/23   Del  Wilhelmena Falter, Hilario, FNP  traZODone  (DESYREL ) 150 MG tablet Take 1 tablet (150 mg total) by mouth at bedtime. 12/16/23   Del Orbe Polanco, Iliana, FNP  vitamin B-12 (CYANOCOBALAMIN ) 100 MCG tablet Take 100 mcg by mouth daily.    [provider]    Allergies: Aspirin  and Nalbuphine    Review of Systems  Constitutional:  Negative for appetite change and fatigue.  HENT:  Negative for congestion, ear discharge and sinus pressure.   Eyes:  Negative for discharge.  Respiratory:  Negative for cough.    Cardiovascular:  Negative for chest pain.  Gastrointestinal:  Negative for abdominal pain and diarrhea.  Genitourinary:  Negative for frequency and hematuria.  Musculoskeletal:  Negative for back pain.       Right shoulder pain  Skin:  Negative for rash.  Neurological:  Negative for seizures and headaches.  Psychiatric/Behavioral:  Negative for hallucinations.     Updated Vital Signs BP (!) 157/81   Pulse (!) 106   Temp 98 F (36.7 C)   Resp 17   Ht 5' 2 (1.575 m)   Wt 101.2 kg   SpO2 96%   BMI 40.79 kg/m   Physical Exam Vitals and nursing note reviewed.  Constitutional:      Appearance: She is well-developed.  HENT:     Head: Normocephalic.     Nose: Nose normal.  Eyes:     General: No scleral icterus.    Conjunctiva/sclera: Conjunctivae normal.  Neck:     Thyroid : No thyromegaly.  Cardiovascular:     Rate and Rhythm: Normal rate and regular rhythm.     Heart sounds: No murmur heard.    No friction rub. No gallop.  Pulmonary:     Breath sounds: No stridor. No wheezing or rales.  Chest:     Chest wall: No tenderness.  Abdominal:     General: There is no distension.     Tenderness: There is no abdominal tenderness. There is no rebound.  Musculoskeletal:        General: Normal range of motion.     Cervical back: Neck supple.     Comments: Tender right shoulder  Lymphadenopathy:     Cervical: No cervical adenopathy.  Skin:    Findings: No erythema or rash.  Neurological:     Mental Status: She is alert and oriented to person, place, and time.     Motor: No abnormal muscle tone.     Coordination: Coordination normal.  Psychiatric:        Behavior: Behavior normal.     (all labs ordered are listed, but only abnormal results are displayed) Labs Reviewed  CBC WITH DIFFERENTIAL/PLATELET - Abnormal; Notable for the following components:      Result Value   Hemoglobin 8.6 (*)    HCT 31.6 (*)    MCV 78.2 (*)    MCH 21.3 (*)    MCHC 27.2 (*)    RDW 17.4  (*)    Platelets 412 (*)    All other components within normal limits  BASIC METABOLIC PANEL WITH GFR - Abnormal; Notable for the following components:   Potassium 3.4 (*)    CO2 18 (*)    Glucose, Bld 116 (*)    Creatinine, Ser 1.56 (*)    Calcium  11.0 (*)    GFR, Estimated 35 (*)    Anion gap 16 (*)    All other components within normal limits    EKG: None  Radiology: DG Shoulder  Right Result Date: 03/08/2024 CLINICAL DATA:  Pain EXAM: RIGHT SHOULDER - 2+ VIEW COMPARISON:  Right shoulder x-ray 02/20/2016 FINDINGS: Again seen is a sideplate and screws fixating a proximal humeral fracture. No evidence for hardware loosening. No acute fracture or dislocation. Soft tissues are within normal limits. IMPRESSION: 1. No acute fracture or dislocation. 2. Status post ORIF of a proximal humeral fracture. Electronically Signed   By: Greig Pique M.D.   On: 03/08/2024 16:52     Procedures   Medications Ordered in the ED - No data to display                                  Medical Decision Making Amount and/or Complexity of Data Reviewed Labs: ordered. Radiology: ordered.  Risk Prescription drug management.  Patient with contusion right shoulder and hypercalcemia along with chronic anemia.  She is given pain medicines and follow-up with her family doctor to recheck her calcium  and follow her anemia     Final diagnoses:  Acute pain of right shoulder  Hypercalcemia    ED Discharge Orders          Ordered    oxyCODONE -acetaminophen  (PERCOCET/ROXICET) 5-325 MG tablet        03/08/24 2020               Suzette Pac, MD 03/10/24 1115

## 2024-03-14 ENCOUNTER — Inpatient Hospital Stay

## 2024-03-14 ENCOUNTER — Other Ambulatory Visit: Payer: Self-pay | Admitting: Family Medicine

## 2024-03-14 ENCOUNTER — Inpatient Hospital Stay: Admitting: Hematology

## 2024-03-14 DIAGNOSIS — I10 Essential (primary) hypertension: Secondary | ICD-10-CM

## 2024-03-18 LAB — GENECONNECT MOLECULAR SCREEN: Genetic Analysis Overall Interpretation: NEGATIVE

## 2024-03-20 ENCOUNTER — Inpatient Hospital Stay

## 2024-03-20 ENCOUNTER — Inpatient Hospital Stay: Admitting: Hematology and Oncology

## 2024-03-29 ENCOUNTER — Encounter (HOSPITAL_COMMUNITY): Payer: Self-pay

## 2024-03-29 ENCOUNTER — Emergency Department (HOSPITAL_COMMUNITY)

## 2024-03-29 ENCOUNTER — Emergency Department (HOSPITAL_COMMUNITY)
Admission: EM | Admit: 2024-03-29 | Discharge: 2024-03-29 | Disposition: A | Discharge status: Home / Self Care | Attending: Emergency Medicine | Admitting: Emergency Medicine

## 2024-03-29 ENCOUNTER — Other Ambulatory Visit: Payer: Self-pay

## 2024-03-29 DIAGNOSIS — R197 Diarrhea, unspecified: Secondary | ICD-10-CM | POA: Diagnosis not present

## 2024-03-29 DIAGNOSIS — R112 Nausea with vomiting, unspecified: Secondary | ICD-10-CM | POA: Insufficient documentation

## 2024-03-29 DIAGNOSIS — E119 Type 2 diabetes mellitus without complications: Secondary | ICD-10-CM | POA: Insufficient documentation

## 2024-03-29 DIAGNOSIS — E039 Hypothyroidism, unspecified: Secondary | ICD-10-CM | POA: Insufficient documentation

## 2024-03-29 DIAGNOSIS — Z7984 Long term (current) use of oral hypoglycemic drugs: Secondary | ICD-10-CM | POA: Insufficient documentation

## 2024-03-29 DIAGNOSIS — Z79899 Other long term (current) drug therapy: Secondary | ICD-10-CM | POA: Insufficient documentation

## 2024-03-29 DIAGNOSIS — I10 Essential (primary) hypertension: Secondary | ICD-10-CM | POA: Insufficient documentation

## 2024-03-29 DIAGNOSIS — K449 Diaphragmatic hernia without obstruction or gangrene: Secondary | ICD-10-CM | POA: Diagnosis not present

## 2024-03-29 DIAGNOSIS — Z79890 Hormone replacement therapy (postmenopausal): Secondary | ICD-10-CM | POA: Diagnosis not present

## 2024-03-29 LAB — URINE DRUG SCREEN
Amphetamines: POSITIVE — AB
Barbiturates: NEGATIVE
Benzodiazepines: NEGATIVE
Cocaine: NEGATIVE
Fentanyl: NEGATIVE
Methadone Scn, Ur: NEGATIVE
Opiates: NEGATIVE
Tetrahydrocannabinol: NEGATIVE

## 2024-03-29 LAB — CBC WITH DIFFERENTIAL/PLATELET
Abs Immature Granulocytes: 0.06 K/uL (ref 0.00–0.07)
Basophils Absolute: 0 K/uL (ref 0.0–0.1)
Basophils Relative: 1 %
Eosinophils Absolute: 0 K/uL (ref 0.0–0.5)
Eosinophils Relative: 0 %
HCT: 31.4 % — ABNORMAL LOW (ref 36.0–46.0)
Hemoglobin: 8.5 g/dL — ABNORMAL LOW (ref 12.0–15.0)
Immature Granulocytes: 1 %
Lymphocytes Relative: 7 %
Lymphs Abs: 0.4 K/uL — ABNORMAL LOW (ref 0.7–4.0)
MCH: 21.2 pg — ABNORMAL LOW (ref 26.0–34.0)
MCHC: 27.1 g/dL — ABNORMAL LOW (ref 30.0–36.0)
MCV: 78.3 fL — ABNORMAL LOW (ref 80.0–100.0)
Monocytes Absolute: 0.1 K/uL (ref 0.1–1.0)
Monocytes Relative: 2 %
Neutro Abs: 5.4 K/uL (ref 1.7–7.7)
Neutrophils Relative %: 89 %
Platelets: 450 K/uL — ABNORMAL HIGH (ref 150–400)
RBC: 4.01 MIL/uL (ref 3.87–5.11)
RDW: 18.6 % — ABNORMAL HIGH (ref 11.5–15.5)
WBC: 6 K/uL (ref 4.0–10.5)
nRBC: 0 % (ref 0.0–0.2)

## 2024-03-29 LAB — URINALYSIS, ROUTINE W REFLEX MICROSCOPIC
Bacteria, UA: NONE SEEN
Bilirubin Urine: NEGATIVE
Glucose, UA: NEGATIVE mg/dL
Hgb urine dipstick: NEGATIVE
Ketones, ur: 20 mg/dL — AB
Nitrite: NEGATIVE
Protein, ur: 30 mg/dL — AB
Specific Gravity, Urine: >1.046 — ABNORMAL HIGH (ref 1.005–1.030)
pH: 5 (ref 5.0–8.0)

## 2024-03-29 LAB — COMPREHENSIVE METABOLIC PANEL WITH GFR
ALT: 9 U/L (ref 0–44)
AST: 17 U/L (ref 15–41)
Albumin: 4 g/dL (ref 3.5–5.0)
Alkaline Phosphatase: 98 U/L (ref 38–126)
Anion gap: 15 (ref 5–15)
BUN: 14 mg/dL (ref 8–23)
CO2: 22 mmol/L (ref 22–32)
Calcium: 10.6 mg/dL — ABNORMAL HIGH (ref 8.9–10.3)
Chloride: 100 mmol/L (ref 98–111)
Creatinine, Ser: 1.28 mg/dL — ABNORMAL HIGH (ref 0.44–1.00)
GFR, Estimated: 44 mL/min — ABNORMAL LOW
Glucose, Bld: 156 mg/dL — ABNORMAL HIGH (ref 70–99)
Potassium: 3.6 mmol/L (ref 3.5–5.1)
Sodium: 137 mmol/L (ref 135–145)
Total Bilirubin: 0.3 mg/dL (ref 0.0–1.2)
Total Protein: 7 g/dL (ref 6.5–8.1)

## 2024-03-29 LAB — RESP PANEL BY RT-PCR (RSV, FLU A&B, COVID)  RVPGX2
Influenza A by PCR: NEGATIVE
Influenza B by PCR: NEGATIVE
Resp Syncytial Virus by PCR: NEGATIVE
SARS Coronavirus 2 by RT PCR: NEGATIVE

## 2024-03-29 LAB — LIPASE, BLOOD: Lipase: 17 U/L (ref 11–51)

## 2024-03-29 MED ORDER — ALUM & MAG HYDROXIDE-SIMETH 200-200-20 MG/5ML PO SUSP
30.0000 mL | Freq: Once | ORAL | Status: AC
  Administered 2024-03-29: 30 mL via ORAL
  Filled 2024-03-29: qty 30

## 2024-03-29 MED ORDER — FAMOTIDINE IN NACL 20-0.9 MG/50ML-% IV SOLN
20.0000 mg | Freq: Once | INTRAVENOUS | Status: AC
  Administered 2024-03-29: 20 mg via INTRAVENOUS
  Filled 2024-03-29: qty 50

## 2024-03-29 MED ORDER — PANTOPRAZOLE SODIUM 20 MG PO TBEC: 20.0000 mg | DELAYED_RELEASE_TABLET | Freq: Every day | ORAL | 0 refills | Status: AC

## 2024-03-29 MED ORDER — SODIUM CHLORIDE 0.9 % IV BOLUS
1000.0000 mL | Freq: Once | INTRAVENOUS | Status: AC
  Administered 2024-03-29: 1000 mL via INTRAVENOUS

## 2024-03-29 MED ORDER — METOCLOPRAMIDE HCL 5 MG/ML IJ SOLN
5.0000 mg | Freq: Once | INTRAMUSCULAR | Status: AC
  Administered 2024-03-29: 5 mg via INTRAVENOUS
  Filled 2024-03-29: qty 2

## 2024-03-29 MED ORDER — ONDANSETRON 4 MG PO TBDP
4.0000 mg | ORAL_TABLET | Freq: Once | ORAL | Status: AC
  Administered 2024-03-29: 4 mg via ORAL
  Filled 2024-03-29: qty 1

## 2024-03-29 MED ORDER — ONDANSETRON HCL 4 MG PO TABS: 4.0000 mg | ORAL_TABLET | ORAL | 0 refills | Status: AC | PRN

## 2024-03-29 MED ORDER — DICYCLOMINE HCL 10 MG PO CAPS
10.0000 mg | ORAL_CAPSULE | Freq: Once | ORAL | Status: AC
  Administered 2024-03-29: 10 mg via ORAL
  Filled 2024-03-29: qty 1

## 2024-03-29 MED ORDER — IOHEXOL 300 MG/ML  SOLN
80.0000 mL | Freq: Once | INTRAMUSCULAR | Status: AC | PRN
  Administered 2024-03-29: 80 mL via INTRAVENOUS

## 2024-03-29 MED ORDER — SUCRALFATE 1 G PO TABS: 1.0000 g | ORAL_TABLET | Freq: Three times a day (TID) | ORAL | 0 refills | Status: AC

## 2024-03-29 MED ORDER — DIPHENHYDRAMINE HCL 50 MG/ML IJ SOLN
12.5000 mg | Freq: Once | INTRAMUSCULAR | Status: AC
  Administered 2024-03-29: 12.5 mg via INTRAVENOUS
  Filled 2024-03-29: qty 1

## 2024-03-29 NOTE — Discharge Instructions (Addendum)
 You should return to the hospital if you experience return of persistent nausea and vomiting that does not resolve and does not allow you to tolerate any food or fluids, persistent fevers for greater than 2-3 more days, increasing abdominal pain that persists despite medications, persistent diarrhea, dizziness, syncope (fainting), or for any other concerns.    Please return to the emergency department immediately for any new or concerning symptoms, or if you get worse.

## 2024-03-29 NOTE — ED Provider Notes (Signed)
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 I reevaluated the patient and found that they have improved  Co morbidities that complicate the patient evaluation  Past Medical History:  Diagnosis Date   Anxiety    Carpal tunnel syndrome    Complication of anesthesia    slow waking up from anesthesia per pt   DDD (degenerative disc disease), lumbar    DVT (deep venous thrombosis) (HCC) 10/17/2012   Essential hypertension    Facet arthritis of lumbar region    Fatty liver    Fibromyalgia    GERD (gastroesophageal reflux disease)    Glaucoma    H/O hiatal hernia    Hypothyroidism    Hypothyroidism    Kidney stones    Osteoporosis    PE (pulmonary embolism) 10/17/2012   Pneumonia    2002   S/P cardiac cath 8005,8002   Normal per report   Status post placement of implantable loop recorder    Removed 2004   Type 2 diabetes mellitus (HCC)       Dispostion: Disposition decision including need for hospitalization was considered, and patient discharged from emergency department.    Final Clinical Impression(s) / ED Diagnoses Final diagnoses:  Nausea vomiting  and diarrhea  Hiatal hernia         [1]  Social History Tobacco Use  Smoking status: Never   Smokeless tobacco: Never  Vaping Use   Vaping status: Never Used  Substance Use Topics   Alcohol use: No    Alcohol/week: 0.0 standard drinks of alcohol    Comment: 06-10-2016 occa.   Drug use: No    Comment: 06-10-2016 per pt no     Elnor Jayson LABOR, DO 03/29/24 2120  "

## 2024-03-29 NOTE — ED Notes (Signed)
 Pt vomited and this tech cleaned up pt and pt was given a new gown and another emesis bag.

## 2024-03-29 NOTE — ED Triage Notes (Signed)
 Pt Bib [facility] for N/V for 1 week. Pt complaining of abd pain. VS WNL  CBG 154

## 2024-04-03 ENCOUNTER — Encounter: Payer: Self-pay | Admitting: *Deleted

## 2024-04-11 ENCOUNTER — Other Ambulatory Visit: Payer: Self-pay | Admitting: Family Medicine

## 2024-04-11 DIAGNOSIS — I10 Essential (primary) hypertension: Secondary | ICD-10-CM

## 2024-04-18 ENCOUNTER — Telehealth: Payer: Self-pay | Admitting: Family Medicine

## 2024-04-18 NOTE — Telephone Encounter (Signed)
 Copied from CRM #[id number]. Topic: Appointments - Scheduling Inquiry for Clinic >> Apr 17, 2024  4:25 PM Jasmin G wrote: Reason for CRM: Pt requested a call back at [phone] to schedule a needed appt for prescription refill, since her PCP is on leave I was unsure on who to schedule her with.

## 2024-04-19 ENCOUNTER — Ambulatory Visit: Payer: Self-pay | Admitting: Nurse Practitioner
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
## 2024-04-20 ENCOUNTER — Ambulatory Visit: Admitting: Family Medicine

## 2024-05-05 ENCOUNTER — Inpatient Hospital Stay: Admitting: Hematology

## 2024-05-05 ENCOUNTER — Inpatient Hospital Stay

## 2024-08-30 ENCOUNTER — Encounter: Admitting: Family Medicine
